# Patient Record
Sex: Female | Born: 1945
Health system: Southern US, Community
[De-identification: ages and names within clinical notes are randomized; demographics above are authoritative.]

## PROBLEM LIST (undated history)

## (undated) DIAGNOSIS — M7020 Olecranon bursitis, unspecified elbow: Secondary | ICD-10-CM

## (undated) DIAGNOSIS — R209 Unspecified disturbances of skin sensation: Secondary | ICD-10-CM

## (undated) DIAGNOSIS — F0390 Unspecified dementia without behavioral disturbance: Secondary | ICD-10-CM

## (undated) DIAGNOSIS — K219 Gastro-esophageal reflux disease without esophagitis: Secondary | ICD-10-CM

## (undated) DIAGNOSIS — M542 Cervicalgia: Secondary | ICD-10-CM

## (undated) DIAGNOSIS — Z5189 Encounter for other specified aftercare: Secondary | ICD-10-CM

## (undated) DIAGNOSIS — N189 Chronic kidney disease, unspecified: Secondary | ICD-10-CM

## (undated) DIAGNOSIS — E785 Hyperlipidemia, unspecified: Secondary | ICD-10-CM

## (undated) DIAGNOSIS — M545 Low back pain, unspecified: Secondary | ICD-10-CM

## (undated) DIAGNOSIS — I509 Heart failure, unspecified: Secondary | ICD-10-CM

## (undated) DIAGNOSIS — M25549 Pain in joints of unspecified hand: Secondary | ICD-10-CM

## (undated) DIAGNOSIS — E1142 Type 2 diabetes mellitus with diabetic polyneuropathy: Secondary | ICD-10-CM

## (undated) DIAGNOSIS — G894 Chronic pain syndrome: Secondary | ICD-10-CM

## (undated) DIAGNOSIS — J449 Chronic obstructive pulmonary disease, unspecified: Secondary | ICD-10-CM

## (undated) DIAGNOSIS — I10 Essential (primary) hypertension: Secondary | ICD-10-CM

## (undated) DIAGNOSIS — IMO0001 Reserved for inherently not codable concepts without codable children: Secondary | ICD-10-CM

## (undated) DIAGNOSIS — G4700 Insomnia, unspecified: Secondary | ICD-10-CM

## (undated) HISTORY — DX: Cervicalgia: M54.2

## (undated) HISTORY — PX: SHOULDER ARTHROSCOPY: SHX128

## (undated) HISTORY — DX: Pain in joints of unspecified hand: M25.549

## (undated) HISTORY — DX: Low back pain: M54.5

## (undated) HISTORY — DX: Type 2 diabetes mellitus with diabetic polyneuropathy: E11.42

## (undated) HISTORY — DX: Unspecified dementia, unspecified severity, without behavioral disturbance, psychotic disturbance, mood disturbance, and anxiety: F03.90

## (undated) HISTORY — PX: BACK SURGERY: SHX140

## (undated) HISTORY — DX: Chronic pain syndrome: G89.4

## (undated) HISTORY — DX: Chronic kidney disease, unspecified: N18.9

## (undated) HISTORY — DX: Gastro-esophageal reflux disease without esophagitis: K21.9

## (undated) HISTORY — DX: Low back pain, unspecified: M54.50

## (undated) HISTORY — PX: CATARACT EXTRACTION: SUR2

## (undated) HISTORY — DX: Olecranon bursitis, unspecified elbow: M70.20

## (undated) HISTORY — DX: Unspecified disturbances of skin sensation: R20.9

## (undated) HISTORY — DX: Insomnia, unspecified: G47.00

## (undated) HISTORY — PX: ABDOMINAL HYSTERECTOMY: SHX81

---

## 1998-03-09 ENCOUNTER — Other Ambulatory Visit: Admission: RE | Admit: 1998-03-09 | Discharge: 1998-03-09 | Payer: Self-pay | Admitting: Family Medicine

## 1998-08-27 ENCOUNTER — Encounter: Payer: Self-pay | Admitting: Emergency Medicine

## 1998-08-27 ENCOUNTER — Emergency Department (HOSPITAL_COMMUNITY): Admission: EM | Admit: 1998-08-27 | Discharge: 1998-08-27 | Payer: Self-pay | Admitting: Emergency Medicine

## 1999-02-02 ENCOUNTER — Encounter: Payer: Self-pay | Admitting: Emergency Medicine

## 1999-02-02 ENCOUNTER — Emergency Department (HOSPITAL_COMMUNITY): Admission: EM | Admit: 1999-02-02 | Discharge: 1999-02-02 | Payer: Self-pay | Admitting: Emergency Medicine

## 1999-03-07 ENCOUNTER — Emergency Department (HOSPITAL_COMMUNITY): Admission: EM | Admit: 1999-03-07 | Discharge: 1999-03-07 | Payer: Self-pay | Admitting: Emergency Medicine

## 2000-04-19 ENCOUNTER — Inpatient Hospital Stay (HOSPITAL_COMMUNITY): Admission: EM | Admit: 2000-04-19 | Discharge: 2000-04-24 | Payer: Self-pay

## 2000-04-19 ENCOUNTER — Encounter: Payer: Self-pay | Admitting: Emergency Medicine

## 2000-04-21 ENCOUNTER — Encounter: Payer: Self-pay | Admitting: Family Medicine

## 2000-10-26 ENCOUNTER — Encounter: Payer: Self-pay | Admitting: Internal Medicine

## 2000-10-26 ENCOUNTER — Inpatient Hospital Stay (HOSPITAL_COMMUNITY): Admission: EM | Admit: 2000-10-26 | Discharge: 2000-10-30 | Payer: Self-pay | Admitting: Internal Medicine

## 2000-12-27 ENCOUNTER — Emergency Department (HOSPITAL_COMMUNITY): Admission: EM | Admit: 2000-12-27 | Discharge: 2000-12-27 | Payer: Self-pay | Admitting: Emergency Medicine

## 2001-06-24 ENCOUNTER — Encounter: Admission: RE | Admit: 2001-06-24 | Discharge: 2001-06-24 | Payer: Self-pay | Admitting: Family Medicine

## 2001-07-29 ENCOUNTER — Encounter: Admission: RE | Admit: 2001-07-29 | Discharge: 2001-07-29 | Payer: Self-pay | Admitting: Family Medicine

## 2001-08-26 ENCOUNTER — Encounter: Admission: RE | Admit: 2001-08-26 | Discharge: 2001-08-26 | Payer: Self-pay | Admitting: Family Medicine

## 2001-09-02 ENCOUNTER — Encounter: Admission: RE | Admit: 2001-09-02 | Discharge: 2001-09-02 | Payer: Self-pay | Admitting: Family Medicine

## 2001-09-16 ENCOUNTER — Encounter: Admission: RE | Admit: 2001-09-16 | Discharge: 2001-09-16 | Payer: Self-pay | Admitting: Family Medicine

## 2001-09-23 ENCOUNTER — Encounter: Admission: RE | Admit: 2001-09-23 | Discharge: 2001-09-23 | Payer: Self-pay | Admitting: Family Medicine

## 2001-09-25 ENCOUNTER — Encounter (INDEPENDENT_AMBULATORY_CARE_PROVIDER_SITE_OTHER): Payer: Self-pay | Admitting: *Deleted

## 2001-09-25 LAB — CONVERTED CEMR LAB

## 2001-09-30 ENCOUNTER — Encounter: Admission: RE | Admit: 2001-09-30 | Discharge: 2001-09-30 | Payer: Self-pay | Admitting: Family Medicine

## 2001-11-01 ENCOUNTER — Emergency Department (HOSPITAL_COMMUNITY): Admission: EM | Admit: 2001-11-01 | Discharge: 2001-11-01 | Payer: Self-pay | Admitting: Emergency Medicine

## 2002-04-05 ENCOUNTER — Encounter: Payer: Self-pay | Admitting: Emergency Medicine

## 2002-04-05 ENCOUNTER — Emergency Department (HOSPITAL_COMMUNITY): Admission: EM | Admit: 2002-04-05 | Discharge: 2002-04-05 | Payer: Self-pay

## 2002-04-27 ENCOUNTER — Encounter: Payer: Self-pay | Admitting: Emergency Medicine

## 2002-04-27 ENCOUNTER — Encounter (INDEPENDENT_AMBULATORY_CARE_PROVIDER_SITE_OTHER): Payer: Self-pay | Admitting: Cardiology

## 2002-04-27 ENCOUNTER — Inpatient Hospital Stay (HOSPITAL_COMMUNITY): Admission: EM | Admit: 2002-04-27 | Discharge: 2002-04-28 | Payer: Self-pay | Admitting: Emergency Medicine

## 2002-04-28 ENCOUNTER — Encounter: Payer: Self-pay | Admitting: Interventional Cardiology

## 2002-05-21 ENCOUNTER — Encounter: Payer: Self-pay | Admitting: *Deleted

## 2002-05-21 ENCOUNTER — Observation Stay (HOSPITAL_COMMUNITY): Admission: AC | Admit: 2002-05-21 | Discharge: 2002-05-22 | Payer: Self-pay | Admitting: *Deleted

## 2003-04-06 ENCOUNTER — Ambulatory Visit (HOSPITAL_COMMUNITY): Admission: RE | Admit: 2003-04-06 | Discharge: 2003-04-06 | Payer: Self-pay | Admitting: Family Medicine

## 2003-09-15 ENCOUNTER — Encounter: Payer: Self-pay | Admitting: Internal Medicine

## 2003-09-15 ENCOUNTER — Ambulatory Visit (HOSPITAL_COMMUNITY): Admission: RE | Admit: 2003-09-15 | Discharge: 2003-09-15 | Payer: Self-pay | Admitting: Internal Medicine

## 2004-01-21 ENCOUNTER — Emergency Department (HOSPITAL_COMMUNITY): Admission: EM | Admit: 2004-01-21 | Discharge: 2004-01-21 | Payer: Self-pay | Admitting: *Deleted

## 2004-08-15 ENCOUNTER — Ambulatory Visit: Payer: Self-pay | Admitting: Nurse Practitioner

## 2004-08-17 ENCOUNTER — Ambulatory Visit: Payer: Self-pay | Admitting: Internal Medicine

## 2004-08-17 ENCOUNTER — Inpatient Hospital Stay (HOSPITAL_COMMUNITY): Admission: EM | Admit: 2004-08-17 | Discharge: 2004-08-24 | Payer: Self-pay | Admitting: Emergency Medicine

## 2004-08-22 ENCOUNTER — Encounter (INDEPENDENT_AMBULATORY_CARE_PROVIDER_SITE_OTHER): Payer: Self-pay | Admitting: *Deleted

## 2004-10-23 ENCOUNTER — Ambulatory Visit: Payer: Self-pay | Admitting: Family Medicine

## 2004-10-24 ENCOUNTER — Ambulatory Visit: Payer: Self-pay | Admitting: Hematology & Oncology

## 2004-11-21 ENCOUNTER — Ambulatory Visit: Payer: Self-pay | Admitting: Nurse Practitioner

## 2004-12-20 ENCOUNTER — Ambulatory Visit: Payer: Self-pay | Admitting: Nurse Practitioner

## 2004-12-20 ENCOUNTER — Encounter: Admission: RE | Admit: 2004-12-20 | Discharge: 2004-12-20 | Payer: Self-pay | Admitting: Internal Medicine

## 2005-01-22 ENCOUNTER — Ambulatory Visit: Payer: Self-pay | Admitting: Nurse Practitioner

## 2005-03-19 ENCOUNTER — Ambulatory Visit: Payer: Self-pay | Admitting: Nurse Practitioner

## 2005-04-19 ENCOUNTER — Ambulatory Visit: Payer: Self-pay | Admitting: Nurse Practitioner

## 2005-05-20 ENCOUNTER — Ambulatory Visit: Payer: Self-pay | Admitting: Nurse Practitioner

## 2005-06-12 ENCOUNTER — Ambulatory Visit: Payer: Self-pay | Admitting: Nurse Practitioner

## 2005-08-08 ENCOUNTER — Ambulatory Visit: Payer: Self-pay | Admitting: Nurse Practitioner

## 2005-09-06 ENCOUNTER — Ambulatory Visit: Payer: Self-pay | Admitting: Nurse Practitioner

## 2005-09-11 ENCOUNTER — Ambulatory Visit (HOSPITAL_COMMUNITY): Admission: RE | Admit: 2005-09-11 | Discharge: 2005-09-11 | Payer: Self-pay | Admitting: Internal Medicine

## 2005-10-10 ENCOUNTER — Ambulatory Visit: Payer: Self-pay | Admitting: Nurse Practitioner

## 2005-11-05 ENCOUNTER — Ambulatory Visit: Payer: Self-pay | Admitting: Nurse Practitioner

## 2005-12-03 ENCOUNTER — Ambulatory Visit: Payer: Self-pay | Admitting: Family Medicine

## 2005-12-20 ENCOUNTER — Ambulatory Visit: Payer: Self-pay | Admitting: Nurse Practitioner

## 2006-01-24 ENCOUNTER — Ambulatory Visit: Payer: Self-pay | Admitting: Nurse Practitioner

## 2006-02-04 ENCOUNTER — Ambulatory Visit: Payer: Self-pay | Admitting: Nurse Practitioner

## 2006-02-21 ENCOUNTER — Ambulatory Visit: Payer: Self-pay | Admitting: Nurse Practitioner

## 2006-03-14 ENCOUNTER — Emergency Department (HOSPITAL_COMMUNITY): Admission: EM | Admit: 2006-03-14 | Discharge: 2006-03-14 | Payer: Self-pay | Admitting: Family Medicine

## 2006-03-24 ENCOUNTER — Ambulatory Visit: Payer: Self-pay | Admitting: Nurse Practitioner

## 2006-04-22 ENCOUNTER — Ambulatory Visit: Payer: Self-pay | Admitting: Nurse Practitioner

## 2006-05-16 ENCOUNTER — Ambulatory Visit: Payer: Self-pay | Admitting: Nurse Practitioner

## 2006-05-31 ENCOUNTER — Emergency Department (HOSPITAL_COMMUNITY): Admission: EM | Admit: 2006-05-31 | Discharge: 2006-05-31 | Payer: Self-pay | Admitting: Emergency Medicine

## 2006-06-13 ENCOUNTER — Ambulatory Visit: Payer: Self-pay | Admitting: Nurse Practitioner

## 2006-08-08 ENCOUNTER — Ambulatory Visit: Payer: Self-pay | Admitting: Nurse Practitioner

## 2006-08-28 ENCOUNTER — Ambulatory Visit: Payer: Self-pay | Admitting: Nurse Practitioner

## 2006-09-18 ENCOUNTER — Ambulatory Visit: Payer: Self-pay | Admitting: Nurse Practitioner

## 2006-11-26 ENCOUNTER — Ambulatory Visit: Payer: Self-pay | Admitting: Nurse Practitioner

## 2006-12-22 ENCOUNTER — Ambulatory Visit: Payer: Self-pay | Admitting: Nurse Practitioner

## 2006-12-30 ENCOUNTER — Emergency Department (HOSPITAL_COMMUNITY): Admission: EM | Admit: 2006-12-30 | Discharge: 2006-12-30 | Payer: Self-pay | Admitting: Family Medicine

## 2007-01-14 ENCOUNTER — Ambulatory Visit: Payer: Self-pay | Admitting: Nurse Practitioner

## 2007-01-19 ENCOUNTER — Ambulatory Visit: Payer: Self-pay | Admitting: Nurse Practitioner

## 2007-01-23 ENCOUNTER — Encounter (INDEPENDENT_AMBULATORY_CARE_PROVIDER_SITE_OTHER): Payer: Self-pay | Admitting: *Deleted

## 2007-02-05 ENCOUNTER — Ambulatory Visit: Payer: Self-pay | Admitting: Nurse Practitioner

## 2007-03-12 ENCOUNTER — Ambulatory Visit: Payer: Self-pay | Admitting: Nurse Practitioner

## 2007-07-03 ENCOUNTER — Ambulatory Visit: Payer: Self-pay | Admitting: Internal Medicine

## 2007-07-28 ENCOUNTER — Ambulatory Visit: Payer: Self-pay | Admitting: Nurse Practitioner

## 2007-07-30 ENCOUNTER — Ambulatory Visit (HOSPITAL_COMMUNITY): Admission: RE | Admit: 2007-07-30 | Discharge: 2007-07-30 | Payer: Self-pay | Admitting: Family Medicine

## 2007-08-27 ENCOUNTER — Ambulatory Visit: Payer: Self-pay | Admitting: Family Medicine

## 2007-09-04 ENCOUNTER — Inpatient Hospital Stay (HOSPITAL_COMMUNITY): Admission: EM | Admit: 2007-09-04 | Discharge: 2007-09-07 | Payer: Self-pay | Admitting: Emergency Medicine

## 2007-09-18 ENCOUNTER — Ambulatory Visit: Payer: Self-pay | Admitting: Family Medicine

## 2007-10-06 ENCOUNTER — Ambulatory Visit (HOSPITAL_COMMUNITY): Admission: RE | Admit: 2007-10-06 | Discharge: 2007-10-06 | Payer: Self-pay | Admitting: Nurse Practitioner

## 2007-12-03 ENCOUNTER — Ambulatory Visit: Payer: Self-pay | Admitting: Family Medicine

## 2008-01-01 ENCOUNTER — Ambulatory Visit: Payer: Self-pay | Admitting: Family Medicine

## 2008-03-25 ENCOUNTER — Ambulatory Visit: Payer: Self-pay | Admitting: Internal Medicine

## 2008-05-03 ENCOUNTER — Ambulatory Visit: Payer: Self-pay | Admitting: Internal Medicine

## 2008-05-08 ENCOUNTER — Inpatient Hospital Stay (HOSPITAL_COMMUNITY): Admission: EM | Admit: 2008-05-08 | Discharge: 2008-05-14 | Payer: Self-pay | Admitting: Emergency Medicine

## 2008-08-30 ENCOUNTER — Emergency Department (HOSPITAL_COMMUNITY): Admission: EM | Admit: 2008-08-30 | Discharge: 2008-08-30 | Payer: Self-pay | Admitting: Emergency Medicine

## 2008-10-07 ENCOUNTER — Ambulatory Visit: Payer: Self-pay | Admitting: Internal Medicine

## 2008-10-07 ENCOUNTER — Encounter (INDEPENDENT_AMBULATORY_CARE_PROVIDER_SITE_OTHER): Payer: Self-pay | Admitting: Internal Medicine

## 2008-10-07 LAB — CONVERTED CEMR LAB
ALT: <8 units/L (ref 0–35)
AST: 10 units/L (ref 0–37)
Albumin: 4.1 g/dL (ref 3.5–5.2)
Alkaline Phosphatase: 74 units/L (ref 39–117)
BUN: 11 mg/dL (ref 6–23)
Basophils Absolute: 0.1 10*3/uL (ref 0.0–0.1)
Basophils Relative: 1 % (ref 0–1)
CO2: 25 meq/L (ref 19–32)
Calcium: 10.1 mg/dL (ref 8.4–10.5)
Chloride: 102 meq/L (ref 96–112)
Creatinine, Ser: 0.9 mg/dL (ref 0.40–1.20)
Eosinophils Absolute: 0.2 10*3/uL (ref 0.0–0.7)
Eosinophils Relative: 2 % (ref 0–5)
Glucose, Bld: 102 mg/dL — ABNORMAL HIGH (ref 70–99)
HCT: 40.7 % (ref 36.0–46.0)
Hemoglobin: 13.1 g/dL (ref 12.0–15.0)
Lymphocytes Relative: 38 % (ref 12–46)
Lymphs Abs: 4.4 10*3/uL — ABNORMAL HIGH (ref 0.7–4.0)
MCHC: 32.2 g/dL (ref 30.0–36.0)
MCV: 74.4 fL — ABNORMAL LOW (ref 78.0–100.0)
Monocytes Absolute: 0.7 10*3/uL (ref 0.1–1.0)
Monocytes Relative: 6 % (ref 3–12)
Neutro Abs: 6.2 10*3/uL (ref 1.7–7.7)
Neutrophils Relative %: 53 % (ref 43–77)
Platelets: 341 10*3/uL (ref 150–400)
Potassium: 4.4 meq/L (ref 3.5–5.3)
RBC: 5.47 M/uL — ABNORMAL HIGH (ref 3.87–5.11)
RDW: 14.1 % (ref 11.5–15.5)
Sodium: 137 meq/L (ref 135–145)
Total Bilirubin: 0.3 mg/dL (ref 0.3–1.2)
Total Protein: 6.4 g/dL (ref 6.0–8.3)
WBC: 11.6 10*3/uL — ABNORMAL HIGH (ref 4.0–10.5)

## 2008-10-10 ENCOUNTER — Ambulatory Visit: Payer: Self-pay | Admitting: Internal Medicine

## 2008-10-11 ENCOUNTER — Ambulatory Visit (HOSPITAL_COMMUNITY): Admission: RE | Admit: 2008-10-11 | Discharge: 2008-10-11 | Payer: Self-pay | Admitting: Internal Medicine

## 2008-11-10 ENCOUNTER — Ambulatory Visit: Payer: Self-pay | Admitting: Internal Medicine

## 2008-11-18 ENCOUNTER — Emergency Department (HOSPITAL_COMMUNITY): Admission: EM | Admit: 2008-11-18 | Discharge: 2008-11-18 | Payer: Self-pay | Admitting: Emergency Medicine

## 2008-12-07 ENCOUNTER — Ambulatory Visit: Payer: Self-pay | Admitting: Internal Medicine

## 2008-12-07 ENCOUNTER — Encounter (INDEPENDENT_AMBULATORY_CARE_PROVIDER_SITE_OTHER): Payer: Self-pay | Admitting: Internal Medicine

## 2008-12-07 LAB — CONVERTED CEMR LAB
Basophils Absolute: 0.1 10*3/uL (ref 0.0–0.1)
Basophils Relative: 0 % (ref 0–1)
Eosinophils Absolute: 0.3 10*3/uL (ref 0.0–0.7)
Eosinophils Relative: 2 % (ref 0–5)
HCT: 42.4 % (ref 36.0–46.0)
Hemoglobin: 13.5 g/dL (ref 12.0–15.0)
Lymphocytes Relative: 35 % (ref 12–46)
Lymphs Abs: 4.6 10*3/uL — ABNORMAL HIGH (ref 0.7–4.0)
MCHC: 31.8 g/dL (ref 30.0–36.0)
MCV: 74.5 fL — ABNORMAL LOW (ref 78.0–100.0)
Monocytes Absolute: 0.9 10*3/uL (ref 0.1–1.0)
Monocytes Relative: 7 % (ref 3–12)
Neutro Abs: 7.3 10*3/uL (ref 1.7–7.7)
Neutrophils Relative %: 56 % (ref 43–77)
Platelets: 317 10*3/uL (ref 150–400)
RBC: 5.69 M/uL — ABNORMAL HIGH (ref 3.87–5.11)
RDW: 14.2 % (ref 11.5–15.5)
WBC: 13.1 10*3/uL — ABNORMAL HIGH (ref 4.0–10.5)

## 2008-12-27 ENCOUNTER — Ambulatory Visit: Payer: Self-pay | Admitting: Hematology & Oncology

## 2009-01-09 ENCOUNTER — Encounter (INDEPENDENT_AMBULATORY_CARE_PROVIDER_SITE_OTHER): Payer: Self-pay | Admitting: Internal Medicine

## 2009-01-09 ENCOUNTER — Ambulatory Visit: Payer: Self-pay | Admitting: Internal Medicine

## 2009-01-09 LAB — CONVERTED CEMR LAB: Microalb, Ur: 0.5 mg/dL (ref 0.00–1.89)

## 2009-01-16 LAB — CBC WITH DIFFERENTIAL (CANCER CENTER ONLY)
BASO#: 0.1 10*3/uL (ref 0.0–0.2)
BASO%: 0.7 % (ref 0.0–2.0)
EOS%: 1.9 % (ref 0.0–7.0)
Eosinophils Absolute: 0.2 10*3/uL (ref 0.0–0.5)
HCT: 42.8 % (ref 34.8–46.6)
HGB: 13.2 g/dL (ref 11.6–15.9)
LYMPH#: 3.1 10*3/uL (ref 0.9–3.3)
LYMPH%: 28.3 % (ref 14.0–48.0)
MCH: 23.8 pg — ABNORMAL LOW (ref 26.0–34.0)
MCHC: 30.8 g/dL — ABNORMAL LOW (ref 32.0–36.0)
MCV: 77 fL — ABNORMAL LOW (ref 81–101)
MONO#: 0.4 10*3/uL (ref 0.1–0.9)
MONO%: 3.9 % (ref 0.0–13.0)
NEUT#: 7.2 10*3/uL — ABNORMAL HIGH (ref 1.5–6.5)
NEUT%: 65.2 % (ref 39.6–80.0)
Platelets: 289 10*3/uL (ref 145–400)
RBC: 5.53 10*6/uL — ABNORMAL HIGH (ref 3.70–5.32)
RDW: 11.7 % (ref 10.5–14.6)
WBC: 11.1 10*3/uL — ABNORMAL HIGH (ref 3.9–10.0)

## 2009-01-16 LAB — CHCC SATELLITE - SMEAR

## 2009-01-20 LAB — HEMOGLOBINOPATHY EVALUATION
Hemoglobin Other: 0 % (ref 0.0–0.0)
Hgb A2 Quant: 2.4 % (ref 2.2–3.2)
Hgb A: 97.6 % (ref 96.8–97.8)
Hgb F Quant: 0 % (ref 0.0–2.0)
Hgb S Quant: 0 % (ref 0.0–0.0)

## 2009-01-20 LAB — ERYTHROPOIETIN: Erythropoietin: 7.4 m[IU]/mL (ref 2.6–34.0)

## 2009-01-20 LAB — JAK2 GENOTYPR

## 2009-01-20 LAB — JAK2 EXONS 12 & 13 MUTATION, REFLEX: JAK2 Exons 12 & 13: 13

## 2009-01-20 LAB — VITAMIN B12: Vitamin B-12: 434 pg/mL (ref 211–911)

## 2009-01-20 LAB — FERRITIN: Ferritin: 288 ng/mL (ref 10–291)

## 2009-01-31 ENCOUNTER — Emergency Department (HOSPITAL_COMMUNITY): Admission: EM | Admit: 2009-01-31 | Discharge: 2009-01-31 | Payer: Self-pay | Admitting: Family Medicine

## 2009-02-23 ENCOUNTER — Ambulatory Visit (HOSPITAL_COMMUNITY): Admission: RE | Admit: 2009-02-23 | Discharge: 2009-02-23 | Payer: Self-pay | Admitting: *Deleted

## 2009-02-23 ENCOUNTER — Encounter (INDEPENDENT_AMBULATORY_CARE_PROVIDER_SITE_OTHER): Payer: Self-pay | Admitting: Internal Medicine

## 2009-02-23 ENCOUNTER — Ambulatory Visit: Payer: Self-pay | Admitting: Internal Medicine

## 2009-02-23 LAB — CONVERTED CEMR LAB
Basophils Absolute: 0.1 10*3/uL (ref 0.0–0.1)
Basophils Relative: 0 % (ref 0–1)
Eosinophils Absolute: 0.2 10*3/uL (ref 0.0–0.7)
Eosinophils Relative: 2 % (ref 0–5)
HCT: 41.3 % (ref 36.0–46.0)
Hemoglobin: 13.5 g/dL (ref 12.0–15.0)
Lymphocytes Relative: 25 % (ref 12–46)
Lymphs Abs: 3.1 10*3/uL (ref 0.7–4.0)
MCHC: 32.7 g/dL (ref 30.0–36.0)
MCV: 76.5 fL — ABNORMAL LOW (ref 78.0–100.0)
Monocytes Absolute: 0.8 10*3/uL (ref 0.1–1.0)
Monocytes Relative: 7 % (ref 3–12)
Neutro Abs: 8.3 10*3/uL — ABNORMAL HIGH (ref 1.7–7.7)
Neutrophils Relative %: 67 % (ref 43–77)
Platelets: 304 10*3/uL (ref 150–400)
RBC: 5.4 M/uL — ABNORMAL HIGH (ref 3.87–5.11)
RDW: 14.9 % (ref 11.5–15.5)
WBC: 12.4 10*3/uL — ABNORMAL HIGH (ref 4.0–10.5)

## 2009-02-27 ENCOUNTER — Ambulatory Visit: Payer: Self-pay | Admitting: Internal Medicine

## 2009-03-06 ENCOUNTER — Ambulatory Visit: Payer: Self-pay | Admitting: Internal Medicine

## 2009-03-13 ENCOUNTER — Ambulatory Visit: Payer: Self-pay | Admitting: Internal Medicine

## 2009-04-13 ENCOUNTER — Encounter
Admission: RE | Admit: 2009-04-13 | Discharge: 2009-07-12 | Payer: Self-pay | Admitting: Physical Medicine and Rehabilitation

## 2009-04-17 ENCOUNTER — Ambulatory Visit: Payer: Self-pay | Admitting: Physical Medicine and Rehabilitation

## 2009-04-21 ENCOUNTER — Ambulatory Visit (HOSPITAL_COMMUNITY)
Admission: RE | Admit: 2009-04-21 | Discharge: 2009-04-21 | Payer: Self-pay | Admitting: Physical Medicine and Rehabilitation

## 2009-05-22 ENCOUNTER — Ambulatory Visit: Payer: Self-pay | Admitting: Physical Medicine and Rehabilitation

## 2009-06-21 ENCOUNTER — Ambulatory Visit: Payer: Self-pay | Admitting: Physical Medicine and Rehabilitation

## 2009-07-06 ENCOUNTER — Ambulatory Visit (HOSPITAL_COMMUNITY)
Admission: RE | Admit: 2009-07-06 | Discharge: 2009-07-06 | Payer: Self-pay | Admitting: Physical Medicine and Rehabilitation

## 2009-07-14 ENCOUNTER — Encounter (INDEPENDENT_AMBULATORY_CARE_PROVIDER_SITE_OTHER): Payer: Self-pay | Admitting: Internal Medicine

## 2009-07-14 ENCOUNTER — Ambulatory Visit: Payer: Self-pay | Admitting: Internal Medicine

## 2009-07-14 LAB — CONVERTED CEMR LAB
ALT: 11 units/L (ref 0–35)
AST: 17 units/L (ref 0–37)
Albumin: 3.9 g/dL (ref 3.5–5.2)
Alkaline Phosphatase: 93 units/L (ref 39–117)
BUN: 7 mg/dL (ref 6–23)
Basophils Absolute: 0 10*3/uL (ref 0.0–0.1)
Basophils Relative: 0 % (ref 0–1)
CO2: 22 meq/L (ref 19–32)
Calcium: 9.6 mg/dL (ref 8.4–10.5)
Chloride: 101 meq/L (ref 96–112)
Creatinine, Ser: 0.95 mg/dL (ref 0.40–1.20)
Eosinophils Absolute: 0.2 10*3/uL (ref 0.0–0.7)
Eosinophils Relative: 2 % (ref 0–5)
Glucose, Bld: 117 mg/dL — ABNORMAL HIGH (ref 70–99)
HCT: 40.2 % (ref 36.0–46.0)
Hemoglobin: 12.5 g/dL (ref 12.0–15.0)
Lymphocytes Relative: 27 % (ref 12–46)
Lymphs Abs: 2.9 10*3/uL (ref 0.7–4.0)
MCHC: 31.1 g/dL (ref 30.0–36.0)
MCV: 76.3 fL — ABNORMAL LOW (ref 78.0–100.0)
Monocytes Absolute: 0.9 10*3/uL (ref 0.1–1.0)
Monocytes Relative: 8 % (ref 3–12)
Neutro Abs: 6.5 10*3/uL (ref 1.7–7.7)
Neutrophils Relative %: 62 % (ref 43–77)
Platelets: 304 10*3/uL (ref 150–400)
Potassium: 4.9 meq/L (ref 3.5–5.3)
RBC: 5.27 M/uL — ABNORMAL HIGH (ref 3.87–5.11)
RDW: 15.2 % (ref 11.5–15.5)
Sodium: 136 meq/L (ref 135–145)
Total Bilirubin: 0.3 mg/dL (ref 0.3–1.2)
Total Protein: 6.1 g/dL (ref 6.0–8.3)
WBC: 10.6 10*3/uL — ABNORMAL HIGH (ref 4.0–10.5)

## 2009-07-17 ENCOUNTER — Encounter
Admission: RE | Admit: 2009-07-17 | Discharge: 2009-10-13 | Payer: Self-pay | Admitting: Physical Medicine and Rehabilitation

## 2009-07-19 ENCOUNTER — Ambulatory Visit: Payer: Self-pay | Admitting: Physical Medicine and Rehabilitation

## 2009-07-21 ENCOUNTER — Telehealth (INDEPENDENT_AMBULATORY_CARE_PROVIDER_SITE_OTHER): Payer: Self-pay | Admitting: *Deleted

## 2009-07-26 ENCOUNTER — Ambulatory Visit (HOSPITAL_COMMUNITY): Admission: RE | Admit: 2009-07-26 | Discharge: 2009-07-26 | Payer: Self-pay | Admitting: Internal Medicine

## 2009-08-18 ENCOUNTER — Ambulatory Visit: Payer: Self-pay | Admitting: Physical Medicine and Rehabilitation

## 2009-08-31 ENCOUNTER — Telehealth (INDEPENDENT_AMBULATORY_CARE_PROVIDER_SITE_OTHER): Payer: Self-pay | Admitting: *Deleted

## 2009-09-15 ENCOUNTER — Ambulatory Visit: Payer: Self-pay | Admitting: Physical Medicine and Rehabilitation

## 2009-09-17 ENCOUNTER — Emergency Department (HOSPITAL_COMMUNITY): Admission: EM | Admit: 2009-09-17 | Discharge: 2009-09-17 | Payer: Self-pay | Admitting: Emergency Medicine

## 2009-10-13 ENCOUNTER — Encounter
Admission: RE | Admit: 2009-10-13 | Discharge: 2009-11-15 | Payer: Self-pay | Admitting: Physical Medicine and Rehabilitation

## 2009-10-16 ENCOUNTER — Ambulatory Visit: Payer: Self-pay | Admitting: Physical Medicine and Rehabilitation

## 2009-10-27 ENCOUNTER — Ambulatory Visit: Payer: Self-pay | Admitting: Internal Medicine

## 2009-10-27 ENCOUNTER — Encounter (INDEPENDENT_AMBULATORY_CARE_PROVIDER_SITE_OTHER): Payer: Self-pay | Admitting: Internal Medicine

## 2009-10-27 LAB — CONVERTED CEMR LAB
ALT: <8 units/L (ref 0–35)
AST: 10 units/L (ref 0–37)
Albumin: 4.3 g/dL (ref 3.5–5.2)
Alkaline Phosphatase: 96 units/L (ref 39–117)
BUN: 11 mg/dL (ref 6–23)
CO2: 25 meq/L (ref 19–32)
Calcium: 9.6 mg/dL (ref 8.4–10.5)
Chloride: 103 meq/L (ref 96–112)
Creatinine, Ser: 0.87 mg/dL (ref 0.40–1.20)
Glucose, Bld: 159 mg/dL — ABNORMAL HIGH (ref 70–99)
Microalb, Ur: 1.25 mg/dL (ref 0.00–1.89)
Potassium: 4.4 meq/L (ref 3.5–5.3)
Sodium: 140 meq/L (ref 135–145)
Total Bilirubin: 0.3 mg/dL (ref 0.3–1.2)
Total Protein: 6.7 g/dL (ref 6.0–8.3)

## 2009-11-29 ENCOUNTER — Encounter
Admission: RE | Admit: 2009-11-29 | Discharge: 2010-02-27 | Payer: Self-pay | Admitting: Physical Medicine and Rehabilitation

## 2009-11-30 ENCOUNTER — Ambulatory Visit: Payer: Self-pay | Admitting: Physical Medicine and Rehabilitation

## 2009-12-02 ENCOUNTER — Emergency Department (HOSPITAL_COMMUNITY): Admission: EM | Admit: 2009-12-02 | Discharge: 2009-12-03 | Payer: Self-pay | Admitting: Emergency Medicine

## 2009-12-03 ENCOUNTER — Ambulatory Visit (HOSPITAL_COMMUNITY): Admission: RE | Admit: 2009-12-03 | Discharge: 2009-12-03 | Payer: Self-pay | Admitting: Emergency Medicine

## 2009-12-15 ENCOUNTER — Ambulatory Visit: Payer: Self-pay | Admitting: Internal Medicine

## 2009-12-29 ENCOUNTER — Ambulatory Visit: Payer: Self-pay | Admitting: Physical Medicine and Rehabilitation

## 2010-01-05 ENCOUNTER — Ambulatory Visit: Payer: Self-pay | Admitting: Internal Medicine

## 2010-01-24 ENCOUNTER — Ambulatory Visit: Payer: Self-pay | Admitting: Physical Medicine and Rehabilitation

## 2010-02-22 ENCOUNTER — Ambulatory Visit: Payer: Self-pay | Admitting: Physical Medicine and Rehabilitation

## 2010-03-14 ENCOUNTER — Encounter
Admission: RE | Admit: 2010-03-14 | Discharge: 2010-06-01 | Payer: Self-pay | Admitting: Physical Medicine and Rehabilitation

## 2010-03-21 ENCOUNTER — Ambulatory Visit: Payer: Self-pay | Admitting: Physical Medicine and Rehabilitation

## 2010-03-23 ENCOUNTER — Ambulatory Visit: Payer: Self-pay | Admitting: Internal Medicine

## 2010-04-02 ENCOUNTER — Ambulatory Visit: Payer: Self-pay | Admitting: Internal Medicine

## 2010-04-04 ENCOUNTER — Ambulatory Visit (HOSPITAL_COMMUNITY)
Admission: RE | Admit: 2010-04-04 | Discharge: 2010-04-04 | Payer: Self-pay | Admitting: Physical Medicine and Rehabilitation

## 2010-04-18 ENCOUNTER — Ambulatory Visit
Admission: RE | Admit: 2010-04-18 | Discharge: 2010-04-18 | Payer: Self-pay | Admitting: Physical Medicine and Rehabilitation

## 2010-04-18 ENCOUNTER — Encounter: Payer: Self-pay | Admitting: Physical Medicine and Rehabilitation

## 2010-04-18 ENCOUNTER — Ambulatory Visit: Payer: Self-pay | Admitting: Physical Medicine and Rehabilitation

## 2010-04-18 ENCOUNTER — Ambulatory Visit: Payer: Self-pay | Admitting: Vascular Surgery

## 2010-05-18 ENCOUNTER — Ambulatory Visit: Payer: Self-pay | Admitting: Internal Medicine

## 2010-05-24 ENCOUNTER — Emergency Department (HOSPITAL_COMMUNITY): Admission: EM | Admit: 2010-05-24 | Discharge: 2010-05-24 | Payer: Self-pay | Admitting: Emergency Medicine

## 2010-06-01 ENCOUNTER — Encounter
Admission: RE | Admit: 2010-06-01 | Discharge: 2010-08-30 | Payer: Self-pay | Admitting: Physical Medicine and Rehabilitation

## 2010-06-01 ENCOUNTER — Ambulatory Visit: Payer: Self-pay | Admitting: Internal Medicine

## 2010-06-12 ENCOUNTER — Emergency Department (HOSPITAL_COMMUNITY): Admission: EM | Admit: 2010-06-12 | Discharge: 2010-06-13 | Payer: Self-pay | Admitting: Emergency Medicine

## 2010-06-22 ENCOUNTER — Ambulatory Visit: Payer: Self-pay | Admitting: Internal Medicine

## 2010-06-22 ENCOUNTER — Ambulatory Visit: Payer: Self-pay | Admitting: Physical Medicine and Rehabilitation

## 2010-06-22 LAB — CONVERTED CEMR LAB
BUN: 11 mg/dL (ref 6–23)
CO2: 24 meq/L (ref 19–32)
Calcium: 9.1 mg/dL (ref 8.4–10.5)
Chloride: 99 meq/L (ref 96–112)
Creatinine, Ser: 1.03 mg/dL (ref 0.40–1.20)
Glucose, Bld: 181 mg/dL — ABNORMAL HIGH (ref 70–99)
Magnesium: 1.7 mg/dL (ref 1.5–2.5)
Potassium: 5.2 meq/L (ref 3.5–5.3)
Pro B Natriuretic peptide (BNP): 182.1 pg/mL — ABNORMAL HIGH (ref 0.0–100.0)
Sodium: 137 meq/L (ref 135–145)

## 2010-07-06 ENCOUNTER — Ambulatory Visit: Payer: Self-pay | Admitting: Physical Medicine and Rehabilitation

## 2010-07-27 ENCOUNTER — Ambulatory Visit: Payer: Self-pay | Admitting: Physical Medicine and Rehabilitation

## 2010-08-06 ENCOUNTER — Ambulatory Visit: Payer: Self-pay | Admitting: Physical Medicine and Rehabilitation

## 2010-08-22 ENCOUNTER — Ambulatory Visit: Payer: Self-pay | Admitting: Physical Medicine and Rehabilitation

## 2010-08-30 ENCOUNTER — Encounter
Admission: RE | Admit: 2010-08-30 | Discharge: 2010-11-22 | Payer: Self-pay | Source: Home / Self Care | Attending: Physical Medicine and Rehabilitation | Admitting: Physical Medicine and Rehabilitation

## 2010-09-05 ENCOUNTER — Ambulatory Visit: Payer: Self-pay | Admitting: Physical Medicine and Rehabilitation

## 2010-09-19 ENCOUNTER — Ambulatory Visit: Payer: Self-pay | Admitting: Physical Medicine and Rehabilitation

## 2010-10-03 ENCOUNTER — Ambulatory Visit: Payer: Self-pay | Admitting: Physical Medicine and Rehabilitation

## 2010-10-16 ENCOUNTER — Ambulatory Visit: Payer: Self-pay | Admitting: Physical Medicine and Rehabilitation

## 2010-10-31 ENCOUNTER — Ambulatory Visit: Payer: Self-pay | Admitting: Physical Medicine and Rehabilitation

## 2010-11-13 ENCOUNTER — Ambulatory Visit: Payer: Self-pay | Admitting: Physical Medicine and Rehabilitation

## 2010-11-27 ENCOUNTER — Encounter
Admission: RE | Admit: 2010-11-27 | Discharge: 2010-12-25 | Payer: Self-pay | Source: Home / Self Care | Attending: Physical Medicine and Rehabilitation | Admitting: Physical Medicine and Rehabilitation

## 2010-12-21 ENCOUNTER — Ambulatory Visit: Admit: 2010-12-21 | Payer: Self-pay | Admitting: Physical Medicine and Rehabilitation

## 2010-12-27 NOTE — Progress Notes (Signed)
Summary: Re: diabectic shoes, inserts and back support from [facility]  Phone Note Other Incoming   Summary of Call: Late entry: see pink sheets for specific dates of interactions: Mr. Doristine Mango with [facility] [employer] has made multiple visits to [facility] related to paperwork for Ms. Ammon's order for diabetic shoes and inserts, and back support. Apparently, they provided the equipment 04/30/09 and her account has been audited by Medicare for proper documentation. He first presents a signed medical release from Ms. Whittinghill stating we can send all medical records to him. Due to ongoing concerns related to the professionalism of this company, the patient was contacted to verify her intent to share her medical records with [employer]. Patient states to [place] Id-Din, ANP and witness by Clinical research associate and Darnelle Maffucci, BOM, that she does not want them to have her records and that was not her understanding of her signature. She states that her records are between her and her provider. Withdrawal of consent signed and Mr. Doristine Mango notified that we would not be able to send any records at this time. He was advised that if the need was for an audit, he could bring the paperwork by and we would provide records for the time period in question, since it is related payment for services. Upon presentation of the audit, it is noted the dates of service in question are 04/30/09. Patient did not have any notes from this time and further, our provider did not agree to order shoes until 07/14/09 during an office visit. 07-20-09 Call to Mr. Doristine Mango and advised we would not be able to assist with any records, encouraged him to check with Ms. Gavina to determine if she was being seen by another provider that approved the order at that time. Advised he is welcome to come and pick up the order if needed, other wise no records will be provided. Have also requested he stop dropping in, he should call if he is in need of assistance.   Initial call taken by: Blondell Reveal RN,  July 21, 2009 10:41 AM

## 2011-01-02 ENCOUNTER — Encounter: Payer: Medicare Other | Attending: Physical Medicine and Rehabilitation

## 2011-01-02 ENCOUNTER — Ambulatory Visit: Payer: Medicare Other | Admitting: Physical Medicine and Rehabilitation

## 2011-01-02 DIAGNOSIS — M79609 Pain in unspecified limb: Secondary | ICD-10-CM

## 2011-01-02 DIAGNOSIS — R29898 Other symptoms and signs involving the musculoskeletal system: Secondary | ICD-10-CM | POA: Insufficient documentation

## 2011-01-02 DIAGNOSIS — M545 Low back pain, unspecified: Secondary | ICD-10-CM

## 2011-01-02 DIAGNOSIS — M25529 Pain in unspecified elbow: Secondary | ICD-10-CM

## 2011-01-02 DIAGNOSIS — S143XXA Injury of brachial plexus, initial encounter: Secondary | ICD-10-CM | POA: Insufficient documentation

## 2011-01-02 DIAGNOSIS — R42 Dizziness and giddiness: Secondary | ICD-10-CM | POA: Insufficient documentation

## 2011-01-02 DIAGNOSIS — M62838 Other muscle spasm: Secondary | ICD-10-CM | POA: Insufficient documentation

## 2011-01-02 DIAGNOSIS — W19XXXS Unspecified fall, sequela: Secondary | ICD-10-CM | POA: Insufficient documentation

## 2011-01-02 DIAGNOSIS — R209 Unspecified disturbances of skin sensation: Secondary | ICD-10-CM | POA: Insufficient documentation

## 2011-01-02 DIAGNOSIS — R279 Unspecified lack of coordination: Secondary | ICD-10-CM

## 2011-01-02 DIAGNOSIS — R262 Difficulty in walking, not elsewhere classified: Secondary | ICD-10-CM | POA: Insufficient documentation

## 2011-01-29 ENCOUNTER — Encounter: Payer: Medicare Other | Attending: Physical Medicine and Rehabilitation

## 2011-01-29 ENCOUNTER — Ambulatory Visit: Payer: Medicare Other

## 2011-01-29 DIAGNOSIS — M25549 Pain in joints of unspecified hand: Secondary | ICD-10-CM

## 2011-01-29 DIAGNOSIS — M545 Low back pain, unspecified: Secondary | ICD-10-CM | POA: Insufficient documentation

## 2011-01-29 DIAGNOSIS — Z79899 Other long term (current) drug therapy: Secondary | ICD-10-CM | POA: Insufficient documentation

## 2011-01-29 DIAGNOSIS — S4490XS Injury of unspecified nerve at shoulder and upper arm level, unspecified arm, sequela: Secondary | ICD-10-CM | POA: Insufficient documentation

## 2011-01-29 DIAGNOSIS — M79609 Pain in unspecified limb: Secondary | ICD-10-CM | POA: Insufficient documentation

## 2011-01-29 DIAGNOSIS — R209 Unspecified disturbances of skin sensation: Secondary | ICD-10-CM

## 2011-01-29 DIAGNOSIS — W19XXXS Unspecified fall, sequela: Secondary | ICD-10-CM | POA: Insufficient documentation

## 2011-01-29 DIAGNOSIS — IMO0002 Reserved for concepts with insufficient information to code with codable children: Secondary | ICD-10-CM | POA: Insufficient documentation

## 2011-01-29 DIAGNOSIS — M542 Cervicalgia: Secondary | ICD-10-CM

## 2011-01-29 DIAGNOSIS — F17200 Nicotine dependence, unspecified, uncomplicated: Secondary | ICD-10-CM | POA: Insufficient documentation

## 2011-02-09 LAB — BASIC METABOLIC PANEL
BUN: 9 mg/dL (ref 6–23)
CO2: 24 mEq/L (ref 19–32)
Calcium: 9.2 mg/dL (ref 8.4–10.5)
Chloride: 101 mEq/L (ref 96–112)
Creatinine, Ser: 0.86 mg/dL (ref 0.4–1.2)
GFR calc Af Amer: >60 mL/min (ref >60)
GFR calc non Af Amer: >60 mL/min (ref >60)
Glucose, Bld: 170 mg/dL — ABNORMAL HIGH (ref 70–99)
Potassium: 3.8 mEq/L (ref 3.5–5.1)
Sodium: 134 mEq/L — ABNORMAL LOW (ref 135–145)

## 2011-02-10 LAB — DIFFERENTIAL
Basophils Absolute: 0 10*3/uL (ref 0.0–0.1)
Basophils Relative: 0 % (ref 0–1)
Eosinophils Absolute: 0.1 10*3/uL (ref 0.0–0.7)
Eosinophils Relative: 1 % (ref 0–5)
Lymphocytes Relative: 23 % (ref 12–46)
Lymphs Abs: 3.1 10*3/uL (ref 0.7–4.0)
Monocytes Absolute: 0.6 10*3/uL (ref 0.1–1.0)
Monocytes Relative: 4 % (ref 3–12)
Neutro Abs: 9.4 10*3/uL — ABNORMAL HIGH (ref 1.7–7.7)
Neutrophils Relative %: 71 % (ref 43–77)

## 2011-02-10 LAB — BASIC METABOLIC PANEL
BUN: 7 mg/dL (ref 6–23)
CO2: 28 mEq/L (ref 19–32)
Calcium: 9.4 mg/dL (ref 8.4–10.5)
Chloride: 105 mEq/L (ref 96–112)
Creatinine, Ser: 1.02 mg/dL (ref 0.4–1.2)
GFR calc Af Amer: >60 mL/min (ref >60)
GFR calc non Af Amer: 54 mL/min — ABNORMAL LOW (ref >60)
Glucose, Bld: 72 mg/dL (ref 70–99)
Potassium: 5 mEq/L (ref 3.5–5.1)
Sodium: 138 mEq/L (ref 135–145)

## 2011-02-10 LAB — CBC
HCT: 38.3 % (ref 36.0–46.0)
Hemoglobin: 12.5 g/dL (ref 12.0–15.0)
MCH: 25.6 pg — ABNORMAL LOW (ref 26.0–34.0)
MCHC: 32.5 g/dL (ref 30.0–36.0)
MCV: 78.6 fL (ref 78.0–100.0)
Platelets: 238 10*3/uL (ref 150–400)
RBC: 4.88 MIL/uL (ref 3.87–5.11)
RDW: 14.7 % (ref 11.5–15.5)
WBC: 13.2 10*3/uL — ABNORMAL HIGH (ref 4.0–10.5)

## 2011-02-10 LAB — URINALYSIS, ROUTINE W REFLEX MICROSCOPIC
Bilirubin Urine: NEGATIVE
Glucose, UA: NEGATIVE mg/dL
Hgb urine dipstick: NEGATIVE
Ketones, ur: NEGATIVE mg/dL
Nitrite: NEGATIVE
Protein, ur: NEGATIVE mg/dL
Specific Gravity, Urine: 1.009 (ref 1.005–1.030)
Urobilinogen, UA: 0.2 mg/dL (ref 0.0–1.0)
pH: 6.5 (ref 5.0–8.0)

## 2011-03-06 LAB — CREATININE, SERUM
Creatinine, Ser: 0.97 mg/dL (ref 0.4–1.2)
GFR calc Af Amer: >60 mL/min (ref >60)
GFR calc non Af Amer: 58 mL/min — ABNORMAL LOW (ref >60)

## 2011-03-06 LAB — BUN: BUN: 4 mg/dL — ABNORMAL LOW (ref 6–23)

## 2011-03-11 ENCOUNTER — Encounter: Payer: Medicare Other | Admitting: Physical Medicine and Rehabilitation

## 2011-04-01 ENCOUNTER — Encounter: Payer: Medicare Other | Attending: Neurosurgery | Admitting: Neurosurgery

## 2011-04-01 DIAGNOSIS — M79609 Pain in unspecified limb: Secondary | ICD-10-CM

## 2011-04-01 DIAGNOSIS — F17200 Nicotine dependence, unspecified, uncomplicated: Secondary | ICD-10-CM | POA: Insufficient documentation

## 2011-04-01 DIAGNOSIS — G894 Chronic pain syndrome: Secondary | ICD-10-CM | POA: Insufficient documentation

## 2011-04-01 DIAGNOSIS — M67919 Unspecified disorder of synovium and tendon, unspecified shoulder: Secondary | ICD-10-CM

## 2011-04-01 DIAGNOSIS — E669 Obesity, unspecified: Secondary | ICD-10-CM | POA: Insufficient documentation

## 2011-04-01 DIAGNOSIS — F3289 Other specified depressive episodes: Secondary | ICD-10-CM | POA: Insufficient documentation

## 2011-04-01 DIAGNOSIS — R209 Unspecified disturbances of skin sensation: Secondary | ICD-10-CM | POA: Insufficient documentation

## 2011-04-01 DIAGNOSIS — F329 Major depressive disorder, single episode, unspecified: Secondary | ICD-10-CM | POA: Insufficient documentation

## 2011-04-01 DIAGNOSIS — M719 Bursopathy, unspecified: Secondary | ICD-10-CM

## 2011-04-01 DIAGNOSIS — M25569 Pain in unspecified knee: Secondary | ICD-10-CM | POA: Insufficient documentation

## 2011-04-01 DIAGNOSIS — M7500 Adhesive capsulitis of unspecified shoulder: Secondary | ICD-10-CM

## 2011-04-02 ENCOUNTER — Inpatient Hospital Stay (HOSPITAL_COMMUNITY)
Admission: EM | Admit: 2011-04-02 | Discharge: 2011-04-07 | DRG: 190 | Disposition: A | Discharge status: Home Health | Payer: Medicare Other | Attending: Emergency Medicine | Admitting: Emergency Medicine

## 2011-04-02 ENCOUNTER — Emergency Department (HOSPITAL_COMMUNITY): Payer: Medicare Other

## 2011-04-02 DIAGNOSIS — G4700 Insomnia, unspecified: Secondary | ICD-10-CM | POA: Diagnosis present

## 2011-04-02 DIAGNOSIS — E119 Type 2 diabetes mellitus without complications: Secondary | ICD-10-CM | POA: Diagnosis present

## 2011-04-02 DIAGNOSIS — K219 Gastro-esophageal reflux disease without esophagitis: Secondary | ICD-10-CM | POA: Diagnosis present

## 2011-04-02 DIAGNOSIS — I509 Heart failure, unspecified: Secondary | ICD-10-CM | POA: Diagnosis present

## 2011-04-02 DIAGNOSIS — F3289 Other specified depressive episodes: Secondary | ICD-10-CM | POA: Diagnosis present

## 2011-04-02 DIAGNOSIS — J9600 Acute respiratory failure, unspecified whether with hypoxia or hypercapnia: Secondary | ICD-10-CM | POA: Diagnosis present

## 2011-04-02 DIAGNOSIS — E875 Hyperkalemia: Secondary | ICD-10-CM | POA: Diagnosis present

## 2011-04-02 DIAGNOSIS — G40909 Epilepsy, unspecified, not intractable, without status epilepticus: Secondary | ICD-10-CM | POA: Diagnosis present

## 2011-04-02 DIAGNOSIS — F329 Major depressive disorder, single episode, unspecified: Secondary | ICD-10-CM | POA: Diagnosis present

## 2011-04-02 DIAGNOSIS — J441 Chronic obstructive pulmonary disease with (acute) exacerbation: Principal | ICD-10-CM | POA: Diagnosis present

## 2011-04-02 DIAGNOSIS — Z794 Long term (current) use of insulin: Secondary | ICD-10-CM

## 2011-04-02 DIAGNOSIS — F17200 Nicotine dependence, unspecified, uncomplicated: Secondary | ICD-10-CM | POA: Diagnosis present

## 2011-04-02 DIAGNOSIS — G894 Chronic pain syndrome: Secondary | ICD-10-CM | POA: Diagnosis present

## 2011-04-02 DIAGNOSIS — E871 Hypo-osmolality and hyponatremia: Secondary | ICD-10-CM | POA: Diagnosis present

## 2011-04-02 LAB — COMPREHENSIVE METABOLIC PANEL
ALT: 9 U/L (ref 0–35)
AST: 13 U/L (ref 0–37)
Albumin: 3.2 g/dL — ABNORMAL LOW (ref 3.5–5.2)
Alkaline Phosphatase: 113 U/L (ref 39–117)
BUN: 10 mg/dL (ref 6–23)
CO2: 30 mEq/L (ref 19–32)
Calcium: 9.9 mg/dL (ref 8.4–10.5)
Chloride: 91 mEq/L — ABNORMAL LOW (ref 96–112)
Creatinine, Ser: 0.99 mg/dL (ref 0.4–1.2)
GFR calc Af Amer: >60 mL/min (ref >60)
GFR calc non Af Amer: 56 mL/min — ABNORMAL LOW (ref >60)
Glucose, Bld: 183 mg/dL — ABNORMAL HIGH (ref 70–99)
Potassium: 5.5 mEq/L — ABNORMAL HIGH (ref 3.5–5.1)
Sodium: 127 mEq/L — ABNORMAL LOW (ref 135–145)
Total Bilirubin: 0.1 mg/dL — ABNORMAL LOW (ref 0.3–1.2)
Total Protein: 6.5 g/dL (ref 6.0–8.3)

## 2011-04-02 LAB — CK TOTAL AND CKMB (NOT AT ARMC)
CK, MB: 4.2 ng/mL — ABNORMAL HIGH (ref 0.3–4.0)
Relative Index: 3 — ABNORMAL HIGH (ref 0.0–2.5)
Total CK: 139 U/L (ref 7–177)

## 2011-04-02 LAB — URINALYSIS, ROUTINE W REFLEX MICROSCOPIC
Bilirubin Urine: NEGATIVE
Glucose, UA: NEGATIVE mg/dL
Hgb urine dipstick: NEGATIVE
Ketones, ur: NEGATIVE mg/dL
Nitrite: NEGATIVE
Protein, ur: NEGATIVE mg/dL
Specific Gravity, Urine: 1.02 (ref 1.005–1.030)
Urobilinogen, UA: 0.2 mg/dL (ref 0.0–1.0)
pH: 5.5 (ref 5.0–8.0)

## 2011-04-02 LAB — CBC
HCT: 38.4 % (ref 36.0–46.0)
Hemoglobin: 12 g/dL (ref 12.0–15.0)
MCH: 24.2 pg — ABNORMAL LOW (ref 26.0–34.0)
MCHC: 31.3 g/dL (ref 30.0–36.0)
MCV: 77.4 fL — ABNORMAL LOW (ref 78.0–100.0)
Platelets: 280 10*3/uL (ref 150–400)
RBC: 4.96 MIL/uL (ref 3.87–5.11)
RDW: 14.7 % (ref 11.5–15.5)
WBC: 14.1 10*3/uL — ABNORMAL HIGH (ref 4.0–10.5)

## 2011-04-02 LAB — DIFFERENTIAL
Basophils Absolute: 0.1 10*3/uL (ref 0.0–0.1)
Basophils Relative: 0 % (ref 0–1)
Eosinophils Absolute: 0.3 10*3/uL (ref 0.0–0.7)
Eosinophils Relative: 2 % (ref 0–5)
Lymphocytes Relative: 29 % (ref 12–46)
Lymphs Abs: 4.1 10*3/uL — ABNORMAL HIGH (ref 0.7–4.0)
Monocytes Absolute: 0.8 10*3/uL (ref 0.1–1.0)
Monocytes Relative: 5 % (ref 3–12)
Neutro Abs: 8.8 10*3/uL — ABNORMAL HIGH (ref 1.7–7.7)
Neutrophils Relative %: 63 % (ref 43–77)

## 2011-04-02 LAB — PRO B NATRIURETIC PEPTIDE: Pro B Natriuretic peptide (BNP): 266.1 pg/mL — ABNORMAL HIGH (ref 0–125)

## 2011-04-02 LAB — TROPONIN I: Troponin I: <0.3 ng/mL (ref <0.30)

## 2011-04-02 NOTE — Assessment & Plan Note (Signed)
The patient was seen by Dr. Pamelia Hoit for right upper extremity pain after dislocation over a year ago.  She said it was taking care of by Dr. Rennis Chris at [facility].  She has got some residual numbness and tingling in the hand and arm and some bilateral knee pain. She rates her pain on average about 8.  She states she is here for med refill as she did not see Dr. Pamelia Hoit last month due to a death in her family.  She has no bottle with her today.  Pains remain the same 24 hours a day.  All activities aggravate, her pain medications only thing that helps.  She walks sometimes with a cane.  She does not climb steps. She can walk only about 4 minutes at a time.  She does not drive.  She is on disability.  REVIEW OF SYSTEMS:  Notable for bladder control problems, some weakness, numbness, tremors strength, tingling, trouble with ambulation, spasms, dizziness, confusion, anxiety, and depression.  She does have suicidal thoughts, no active plan.  Otherwise, she does have painful urination, poor appetite, constipation, diarrhea, vomiting, nausea, some skin breakdown, fever and chills, weight gain, night sweats, shortness of breath, and severe wheezing with respiration.  PAST MEDICAL HISTORY:  Unchanged.  SOCIAL HISTORY:  She is widowed.  She lives with her knees.  She smokes.  FAMILY HISTORY:  Significant for heart disease, lung disease, diabetes.  Physical exam shows her to walk slowly with somewhat kyphotic posture. She has got severe expiratory respiration wheezing and bilateral rales. She is alert and oriented x3.  She will appear somewhat depressed.  She is morbidly obese.  Her motor strength 5/5 with iliopsoas, quadriceps, dorsiflexor plantarflexor in upper extremities.  Her right upper right extremity is weak.  She gives away to pain with any kind of resistance, testing.  Her sensation is diminished in right upper extremity, otherwise normal.  She has diminished range of  motion in the right arm.  ASSESSMENT: 1. Chronic pain syndrome. 2. Paresthesias in the right upper extremity status post injury. 3. Depression. 4. Tobacco abuse. 5. Obesity.  PLAN: 1. We went ahead and refilled her Percocet 10/325 one p.o. q.i.d. 120     with no refill.  She did undergo UDS that will be for Dr.     Leretha Dykes review. 2. She will follow Dr. Pamelia Hoit in 1 month.  All her questions were     encouraged and answered.     Verner Mccrone L. Blima Dessert    RLW/MedQ D:  04/01/2011 14:23:31  T:  04/02/2011 02:03:56  Job #:  [id number]

## 2011-04-03 DIAGNOSIS — I369 Nonrheumatic tricuspid valve disorder, unspecified: Secondary | ICD-10-CM

## 2011-04-03 LAB — BLOOD GAS, ARTERIAL
Acid-Base Excess: 2.7 mmol/L — ABNORMAL HIGH (ref 0.0–2.0)
Bicarbonate: 28.7 mEq/L — ABNORMAL HIGH (ref 20.0–24.0)
Drawn by: 28337
FIO2: 0.21 %
O2 Saturation: 88.6 %
Patient temperature: 98.6
TCO2: 30.6 mmol/L (ref 0–100)
pCO2 arterial: 61.9 mmHg (ref 35.0–45.0)
pH, Arterial: 7.288 — ABNORMAL LOW (ref 7.350–7.400)
pO2, Arterial: 55.4 mmHg — ABNORMAL LOW (ref 80.0–100.0)

## 2011-04-03 LAB — COMPREHENSIVE METABOLIC PANEL
ALT: 9 U/L (ref 0–35)
AST: 13 U/L (ref 0–37)
Albumin: 3.2 g/dL — ABNORMAL LOW (ref 3.5–5.2)
Alkaline Phosphatase: 112 U/L (ref 39–117)
BUN: 11 mg/dL (ref 6–23)
CO2: 31 mEq/L (ref 19–32)
Calcium: 9.7 mg/dL (ref 8.4–10.5)
Chloride: 91 mEq/L — ABNORMAL LOW (ref 96–112)
Creatinine, Ser: 1.04 mg/dL (ref 0.4–1.2)
GFR calc Af Amer: >60 mL/min (ref >60)
GFR calc non Af Amer: 53 mL/min — ABNORMAL LOW (ref >60)
Glucose, Bld: 216 mg/dL — ABNORMAL HIGH (ref 70–99)
Potassium: 4.8 mEq/L (ref 3.5–5.1)
Sodium: 128 mEq/L — ABNORMAL LOW (ref 135–145)
Total Bilirubin: 0.2 mg/dL — ABNORMAL LOW (ref 0.3–1.2)
Total Protein: 6.5 g/dL (ref 6.0–8.3)

## 2011-04-03 LAB — HEMOGLOBIN A1C
Hgb A1c MFr Bld: 10.5 % — ABNORMAL HIGH (ref <5.7)
Mean Plasma Glucose: 255 mg/dL — ABNORMAL HIGH (ref <117)

## 2011-04-03 LAB — TSH: TSH: 0.938 u[IU]/mL (ref 0.350–4.500)

## 2011-04-03 LAB — LIPID PANEL
Cholesterol: 224 mg/dL — ABNORMAL HIGH (ref 0–200)
HDL: 43 mg/dL (ref >39)
LDL Cholesterol: 131 mg/dL — ABNORMAL HIGH (ref 0–99)
Total CHOL/HDL Ratio: 5.2 RATIO
Triglycerides: 249 mg/dL — ABNORMAL HIGH (ref <150)
VLDL: 50 mg/dL — ABNORMAL HIGH (ref 0–40)

## 2011-04-03 LAB — GLUCOSE, CAPILLARY
Glucose-Capillary: 237 mg/dL — ABNORMAL HIGH (ref 70–99)
Glucose-Capillary: 181 mg/dL — ABNORMAL HIGH (ref 70–99)
Glucose-Capillary: 235 mg/dL — ABNORMAL HIGH (ref 70–99)
Glucose-Capillary: 185 mg/dL — ABNORMAL HIGH (ref 70–99)
Glucose-Capillary: 292 mg/dL — ABNORMAL HIGH (ref 70–99)

## 2011-04-03 LAB — CBC
HCT: 37.2 % (ref 36.0–46.0)
Hemoglobin: 11.4 g/dL — ABNORMAL LOW (ref 12.0–15.0)
MCH: 23.7 pg — ABNORMAL LOW (ref 26.0–34.0)
MCHC: 30.6 g/dL (ref 30.0–36.0)
MCV: 77.3 fL — ABNORMAL LOW (ref 78.0–100.0)
Platelets: 271 10*3/uL (ref 150–400)
RBC: 4.81 MIL/uL (ref 3.87–5.11)
RDW: 14.9 % (ref 11.5–15.5)
WBC: 12.4 10*3/uL — ABNORMAL HIGH (ref 4.0–10.5)

## 2011-04-03 LAB — CARDIAC PANEL(CRET KIN+CKTOT+MB+TROPI)
CK, MB: 3.9 ng/mL (ref 0.3–4.0)
CK, MB: 3.5 ng/mL (ref 0.3–4.0)
Relative Index: 3.1 — ABNORMAL HIGH (ref 0.0–2.5)
Relative Index: 3.3 — ABNORMAL HIGH (ref 0.0–2.5)
Total CK: 126 U/L (ref 7–177)
Total CK: 105 U/L (ref 7–177)
Troponin I: <0.3 ng/mL (ref <0.30)
Troponin I: <0.3 ng/mL (ref <0.30)

## 2011-04-03 LAB — MRSA PCR SCREENING: MRSA by PCR: NEGATIVE

## 2011-04-03 LAB — PHOSPHORUS: Phosphorus: 4.7 mg/dL — ABNORMAL HIGH (ref 2.3–4.6)

## 2011-04-03 LAB — MAGNESIUM: Magnesium: 2.2 mg/dL (ref 1.5–2.5)

## 2011-04-04 ENCOUNTER — Inpatient Hospital Stay (HOSPITAL_COMMUNITY): Payer: Medicare Other

## 2011-04-04 LAB — CBC
HCT: 36 % (ref 36.0–46.0)
Hemoglobin: 11.3 g/dL — ABNORMAL LOW (ref 12.0–15.0)
MCH: 24 pg — ABNORMAL LOW (ref 26.0–34.0)
MCHC: 31.4 g/dL (ref 30.0–36.0)
MCV: 76.6 fL — ABNORMAL LOW (ref 78.0–100.0)
Platelets: 279 10*3/uL (ref 150–400)
RBC: 4.7 MIL/uL (ref 3.87–5.11)
RDW: 14.5 % (ref 11.5–15.5)
WBC: 13.1 10*3/uL — ABNORMAL HIGH (ref 4.0–10.5)

## 2011-04-04 LAB — COMPREHENSIVE METABOLIC PANEL
ALT: 10 U/L (ref 0–35)
AST: 13 U/L (ref 0–37)
Albumin: 3.1 g/dL — ABNORMAL LOW (ref 3.5–5.2)
Alkaline Phosphatase: 109 U/L (ref 39–117)
BUN: 15 mg/dL (ref 6–23)
CO2: 29 mEq/L (ref 19–32)
Calcium: 9.5 mg/dL (ref 8.4–10.5)
Chloride: 95 mEq/L — ABNORMAL LOW (ref 96–112)
Creatinine, Ser: 0.88 mg/dL (ref 0.4–1.2)
GFR calc Af Amer: >60 mL/min (ref >60)
GFR calc non Af Amer: >60 mL/min (ref >60)
Glucose, Bld: 353 mg/dL — ABNORMAL HIGH (ref 70–99)
Potassium: 6 mEq/L — ABNORMAL HIGH (ref 3.5–5.1)
Sodium: 130 mEq/L — ABNORMAL LOW (ref 135–145)
Total Bilirubin: 0.2 mg/dL — ABNORMAL LOW (ref 0.3–1.2)
Total Protein: 6.2 g/dL (ref 6.0–8.3)

## 2011-04-04 LAB — BLOOD GAS, ARTERIAL
Acid-Base Excess: 7.4 mmol/L — ABNORMAL HIGH (ref 0.0–2.0)
Bicarbonate: 31.8 mEq/L — ABNORMAL HIGH (ref 20.0–24.0)
Drawn by: 31996
O2 Content: 2 L/min
O2 Saturation: 89.7 %
Patient temperature: 98.6
TCO2: 33.3 mmol/L (ref 0–100)
pCO2 arterial: 49 mmHg — ABNORMAL HIGH (ref 35.0–45.0)
pH, Arterial: 7.428 — ABNORMAL HIGH (ref 7.350–7.400)
pO2, Arterial: 53.8 mmHg — ABNORMAL LOW (ref 80.0–100.0)

## 2011-04-04 LAB — POCT I-STAT 3, ART BLOOD GAS (G3+)
Acid-Base Excess: 1 mmol/L (ref 0.0–2.0)
Bicarbonate: 28.4 mEq/L — ABNORMAL HIGH (ref 20.0–24.0)
O2 Saturation: 96 %
Patient temperature: 37
TCO2: 30 mmol/L (ref 0–100)
pCO2 arterial: 56.4 mmHg — ABNORMAL HIGH (ref 35.0–45.0)
pH, Arterial: 7.31 — ABNORMAL LOW (ref 7.350–7.400)
pO2, Arterial: 95 mmHg (ref 80.0–100.0)

## 2011-04-04 LAB — GLUCOSE, CAPILLARY
Glucose-Capillary: 425 mg/dL — ABNORMAL HIGH (ref 70–99)
Glucose-Capillary: 355 mg/dL — ABNORMAL HIGH (ref 70–99)
Glucose-Capillary: 300 mg/dL — ABNORMAL HIGH (ref 70–99)

## 2011-04-05 ENCOUNTER — Inpatient Hospital Stay (HOSPITAL_COMMUNITY): Payer: Medicare Other

## 2011-04-05 LAB — COMPREHENSIVE METABOLIC PANEL
ALT: 9 U/L (ref 0–35)
AST: 12 U/L (ref 0–37)
Albumin: 2.9 g/dL — ABNORMAL LOW (ref 3.5–5.2)
Alkaline Phosphatase: 108 U/L (ref 39–117)
BUN: 15 mg/dL (ref 6–23)
CO2: 33 mEq/L — ABNORMAL HIGH (ref 19–32)
Calcium: 9.4 mg/dL (ref 8.4–10.5)
Chloride: 97 mEq/L (ref 96–112)
Creatinine, Ser: 0.73 mg/dL (ref 0.4–1.2)
GFR calc Af Amer: >60 mL/min (ref >60)
GFR calc non Af Amer: >60 mL/min (ref >60)
Glucose, Bld: 229 mg/dL — ABNORMAL HIGH (ref 70–99)
Potassium: 3.8 mEq/L (ref 3.5–5.1)
Sodium: 137 mEq/L (ref 135–145)
Total Bilirubin: 0.1 mg/dL — ABNORMAL LOW (ref 0.3–1.2)
Total Protein: 5.8 g/dL — ABNORMAL LOW (ref 6.0–8.3)

## 2011-04-05 LAB — GLUCOSE, CAPILLARY
Glucose-Capillary: 318 mg/dL — ABNORMAL HIGH (ref 70–99)
Glucose-Capillary: 271 mg/dL — ABNORMAL HIGH (ref 70–99)
Glucose-Capillary: 206 mg/dL — ABNORMAL HIGH (ref 70–99)
Glucose-Capillary: 134 mg/dL — ABNORMAL HIGH (ref 70–99)
Glucose-Capillary: 226 mg/dL — ABNORMAL HIGH (ref 70–99)
Glucose-Capillary: 292 mg/dL — ABNORMAL HIGH (ref 70–99)

## 2011-04-05 LAB — BLOOD GAS, ARTERIAL
Acid-Base Excess: 6.8 mmol/L — ABNORMAL HIGH (ref 0.0–2.0)
Acid-Base Excess: 5.2 mmol/L — ABNORMAL HIGH (ref 0.0–2.0)
Bicarbonate: 31 mEq/L — ABNORMAL HIGH (ref 20.0–24.0)
Bicarbonate: 28.7 mEq/L — ABNORMAL HIGH (ref 20.0–24.0)
Drawn by: 24486
Drawn by: 22563
FIO2: 0.21 %
O2 Content: 2 L/min
O2 Saturation: 94.5 %
O2 Saturation: 90.4 %
Patient temperature: 98.6
Patient temperature: 98.6
TCO2: 32.4 mmol/L (ref 0–100)
TCO2: 29.9 mmol/L (ref 0–100)
pCO2 arterial: 45.8 mmHg — ABNORMAL HIGH (ref 35.0–45.0)
pCO2 arterial: 38.8 mmHg (ref 35.0–45.0)
pH, Arterial: 7.445 — ABNORMAL HIGH (ref 7.350–7.400)
pH, Arterial: 7.482 — ABNORMAL HIGH (ref 7.350–7.400)
pO2, Arterial: 66.2 mmHg — ABNORMAL LOW (ref 80.0–100.0)
pO2, Arterial: 54.1 mmHg — ABNORMAL LOW (ref 80.0–100.0)

## 2011-04-05 LAB — CBC
HCT: 34.3 % — ABNORMAL LOW (ref 36.0–46.0)
Hemoglobin: 10.8 g/dL — ABNORMAL LOW (ref 12.0–15.0)
MCH: 23.7 pg — ABNORMAL LOW (ref 26.0–34.0)
MCHC: 31.5 g/dL (ref 30.0–36.0)
MCV: 75.2 fL — ABNORMAL LOW (ref 78.0–100.0)
Platelets: 304 10*3/uL (ref 150–400)
RBC: 4.56 MIL/uL (ref 3.87–5.11)
RDW: 14.4 % (ref 11.5–15.5)
WBC: 18.3 10*3/uL — ABNORMAL HIGH (ref 4.0–10.5)

## 2011-04-06 LAB — POCT I-STAT 3, ART BLOOD GAS (G3+)
Acid-Base Excess: 7 mmol/L — ABNORMAL HIGH (ref 0.0–2.0)
Bicarbonate: 30.8 mEq/L — ABNORMAL HIGH (ref 20.0–24.0)
O2 Saturation: 92 %
Patient temperature: 37
TCO2: 32 mmol/L (ref 0–100)
pCO2 arterial: 40.5 mmHg (ref 35.0–45.0)
pH, Arterial: 7.489 — ABNORMAL HIGH (ref 7.350–7.400)
pO2, Arterial: 59 mmHg — ABNORMAL LOW (ref 80.0–100.0)

## 2011-04-06 LAB — CBC
HCT: 36.4 % (ref 36.0–46.0)
Hemoglobin: 11.9 g/dL — ABNORMAL LOW (ref 12.0–15.0)
MCH: 24.6 pg — ABNORMAL LOW (ref 26.0–34.0)
MCHC: 32.7 g/dL (ref 30.0–36.0)
MCV: 75.4 fL — ABNORMAL LOW (ref 78.0–100.0)
Platelets: 318 10*3/uL (ref 150–400)
RBC: 4.83 MIL/uL (ref 3.87–5.11)
RDW: 14.9 % (ref 11.5–15.5)
WBC: 15.3 10*3/uL — ABNORMAL HIGH (ref 4.0–10.5)

## 2011-04-06 LAB — GLUCOSE, CAPILLARY
Glucose-Capillary: 196 mg/dL — ABNORMAL HIGH (ref 70–99)
Glucose-Capillary: 250 mg/dL — ABNORMAL HIGH (ref 70–99)
Glucose-Capillary: 167 mg/dL — ABNORMAL HIGH (ref 70–99)
Glucose-Capillary: 191 mg/dL — ABNORMAL HIGH (ref 70–99)

## 2011-04-06 LAB — COMPREHENSIVE METABOLIC PANEL
ALT: 7 U/L (ref 0–35)
AST: 9 U/L (ref 0–37)
Albumin: 2.8 g/dL — ABNORMAL LOW (ref 3.5–5.2)
Alkaline Phosphatase: 95 U/L (ref 39–117)
BUN: 15 mg/dL (ref 6–23)
CO2: 31 mEq/L (ref 19–32)
Calcium: 9.7 mg/dL (ref 8.4–10.5)
Chloride: 100 mEq/L (ref 96–112)
Creatinine, Ser: 0.95 mg/dL (ref 0.4–1.2)
GFR calc Af Amer: >60 mL/min (ref >60)
GFR calc non Af Amer: 59 mL/min — ABNORMAL LOW (ref >60)
Glucose, Bld: 218 mg/dL — ABNORMAL HIGH (ref 70–99)
Potassium: 3.9 mEq/L (ref 3.5–5.1)
Sodium: 138 mEq/L (ref 135–145)
Total Bilirubin: 0.1 mg/dL — ABNORMAL LOW (ref 0.3–1.2)
Total Protein: 5.9 g/dL — ABNORMAL LOW (ref 6.0–8.3)

## 2011-04-07 LAB — GLUCOSE, CAPILLARY
Glucose-Capillary: 101 mg/dL — ABNORMAL HIGH (ref 70–99)
Glucose-Capillary: 121 mg/dL — ABNORMAL HIGH (ref 70–99)

## 2011-04-09 NOTE — Assessment & Plan Note (Signed)
Yvonne [name] is a 65 year old widowed LUMBI [place].  She is referred  by Dr. Reche Dixon.  She has multiple chronic pain complaints.  She was seen  initially for the first time on Apr 17, 2009.   She had multiple chronic pain complaints including lumbago, right  shoulder pain, bilateral upper extremity pain, and hand pain.  She also  underwent a fairly significant left groin operation due to the  fasciitis, which required wide surgical debridement and she lost a  significant amount of soft tissue in her left groin area approximately 5  years ago.   Her average pain is about 8 on a scale of 10.  Sleep tends to be poor.  Pain is described as sharp, dull, aching in nature, variably pain is  worse with activities and improved such as walking, bending, standing,  and improves with rest.   MEDICATIONS:  She gets good relief with current medications.   Medications, which are prescribed to her through Dr. Tyler Deis Id-Din;  oxycodone 10/325 up to 3 times per day.   FUNCTIONAL STATUS:  She is able to walk 10 minutes at a time.  She does  not climb stairs nor drive.  She is independent with self-care.  She  admits to some weakness, numbness, and trouble walking.  Denies  depression, anxiety, or suicidal ideation.  Denies problems controlling  bowel or bladder.   Review of systems also positive for a variations in blood pressure, poor  appetite, and limb swelling.   No other change in past medical, social, or family history since last  visit.   Laboratory results from last visit showed that her urine drug screen was  consistent with respect to the range of medications.  We also ordered  sed rate, rheumatoid factor, and antinuclear antibody.  Results are  attached to the chart.  Sed rate was 6; normal 0-22.  Rheumatoid factor  was 56; normal 0-20, and antinuclear antibody was positive, which was  abnormal.   Radiographs, which were done at the last visit are also attached to the  chart.  Lumbar  radiograph showed transitional tight L5 vertebral body,  mild degenerative facet disease in the lower spine without instability,  bilateral hand radiographs 3 views showed no acute findings or  significant arthritic change.  Right elbow also showed no significant  arthritic change and essentially negative radiographs as well.   PHYSICAL EXAMINATION:  VITAL SIGNS:  Today blood pressure is 144/81,  pulse 82, respiration 18, and 96% saturated on room air.  GENERAL:  She is well-developed, obese female, who does not appear in  any distress.  She is oriented x3.  Speech is clear.  Affect is bright.  She is alert, cooperative, and pleasant.  Follows commands without  difficulty and answers my questions appropriately.  NEUROLOGIC:  Cranial nerves and coordination are intact.  Reflexes are  1+ in upper and lower extremities without abnormal tone, clonus, or  tremors.   Sensation is intact.   Limitations in cervical range of motion is full.  Shoulder range of  motion especially on the right are noted.  Limitations in lumbar motion  in all planes are noted as well.  Tandem gait and Romberg test are  performed adequately.  Limitations in lumbar motion noted in all planes.   Bilateral upper extremities are examined again.  She has a significant  amount of synovitis in the first and second metacarpal phalangeal joints  and bilaterally mild tenderness is noted.  She also has  a small nodule  over the right elbow, which has had just came up in the last couple of  weeks.   IMPRESSION:  1. Positive rheumatoid factor and antinuclear antibody.  2. Chronic hand pain and right elbow pain.  3. Chronic right shoulder pain most likely secondary to bursitis and      shoulder tendonitis.  4. Chronic low back pain most likely related to facet arthropathy.  5. Status post significant left groin debridement 5 years ago with      significant reduction in soft tissue in this area.   PLAN:  1. We will obtain  cervical radiograph obtained in flexion/extension      films.  2. Phone call over to primary care doctor, Dr. Onalee Hua Talbot's office      spoke with Dr. Tyler Deis Id-Din regarding positive ANA and RF.  She      states that she will make appropriate referral for Ms. Delford Field      through their office.  I also discussed that taking over her      Percocet prescription for that office as well, a prescription for      Percocet 10/325 one p.o. t.i.d. was written for Ms. Delford Field today.      Again, we discussed the importance of taking medications as      prescribed, bringing bottles in for pill counts and to document      fill date.  We will see her back in a month.  This was also      discussed with Ceasar Mons, who Ms. Lonon requested to be in      the room toward the end of our visits to review our treatment plan.      We will see her back in a month.           ______________________________  Brantley Stage, M.D.     DMK/MedQ  D:  05/22/2009 13:51:59  T:  05/23/2009 04:47:09  Job #:  [id number]

## 2011-04-09 NOTE — Discharge Summary (Signed)
Yvonne [name], Yvonne [name]                ACCOUNT NO.:  [id number]   MEDICAL RECORD NO.:  [id number]          PATIENT TYPE:  INP   LOCATION:  1519                         FACILITY:  [facility]   PHYSICIAN:  Hillery Aldo, M.D.   DATE OF BIRTH:  [birth year]   DATE OF ADMISSION:  05/08/2008  DATE OF DISCHARGE:                               DISCHARGE SUMMARY   PRIMARY CARE PHYSICIAN:  Dr. Elby Beck of [facility].   DISCHARGE DIAGNOSES:  1. Left lower lobe pneumonia.  2. Hyponatremia.  3. Microcytic anemia/Anemia of chronic disease.  4. Morbid obesity.  5. Delirium.  6. Diabetes mellitus.  7. Chronic obstructive pulmonary disease.  8. Constipation  9. History of depression.  10.History of anxiety.  11.Gastroesophageal reflux disease.  12.Osteoarthritis.  13.Left sphenoid sinusitis.   DISCHARGE MEDICATIONS:  1. Citalopram 20 mg daily (decreased from 40 mg).  2. Singulair 10 mg daily.  3. Alprazolam 1 mg t.i.d.  4. Ambien 10 mg nightly p.r.n.  5. Ranitidine 150 mg b.i.d.  6. Lantus insulin 15 units subcutaneously nightly.  7. Avelox 400 mg daily x 10 more days.   CONSULTATIONS:  None.   BRIEF ADMISSION HPI:  The patient is a 65 year old female who presented  to the hospital with a chief complaint of chills, sweats, and symptoms  suggestive of delirium.  She was admitted for further evaluation and  workup.  For the full details, please see the dictated report done by  Dr. Roxan Hockey.   PROCEDURES AND DIAGNOSTIC STUDIES:  1. Chest x-ray on May 08, 2008 showed asymmetric haziness in the      lingula and left lower lobe suspicious for evolving infiltrate.  2. CT of the head on May 08, 2008 showed no acute intracranial      abnormality.  Mild advanced cerebral atrophy.  Left sphenoid fluid      suggestive of sinusitis.   DISCHARGE LABORATORY VALUES:  Sodium 130, potassium 4.3, chloride 102,  bicarb 24, glucose 117, BUN 12, creatinine 0.91.  White blood cell count  8.7, hemoglobin 9.4,  hematocrit 29.3, platelets 252, with an MCV of  76.4.   HOSPITAL COURSE BY PROBLEM:  Problem #1:  Left lower lobe pneumonia with  sinusitis.  The patient was admitted and has completed 5 days of  antibiotic therapy, currently with Avelox.  She will complete a 2-week  course of therapy for treatment of pneumonia and sinusitis.  She is  afebrile and her white blood cell count has normalized.   Problem #2:  Hyponatremia.  The patient's admission sodium was low at  127.  She has a history of hyponatremia in the past.  It is felt that  her hyponatremia may be exacerbated by her serotonin reuptake inhibitor.  Given this, her dosage was reduced from 40 mg of citalopram to 20 mg.  Her sodium level has come up to 130.   Problem #3:  Microcytic anemia.  The patient did have an anemia panel  done.  There is no evidence of B12 or folate deficiency.  Her  reticulocyte count was low, but her ferritin was high; and  she had low  iron, low total iron binding capacity with a low percent saturation all  consistent with anemia of chronic disease.   Problem #4:  Morbid obesity. The patient was seen in consultation with  the nutritionist.  She was able to verbalize an appropriate diabetic  diet.  She was encouraged to continue with a weight loss efforts.   Problem #5:  Delirium.  The patient's delirium resolved.  It was felt to  be related to her underlying pneumonia.   Problem #6:  Diabetes.  The patient was put on a combination of Lantus  and sliding scale insulin.  She normally uses Lantus mono therapy at  home.  Her hemoglobin A1c was found to 6.6 indicating adequate control  on this outpatient regimen.   Problem #7:  Chronic obstructive pulmonary disease.  The patient's  respiratory status has remained stable.   Problem #8: Constipation.  The patient did have problems with  constipation during the course of her hospitalization.  She responded to  MiraLax and Dulcolax suppositories.    DISPOSITION:  The patient is approaching medical stability, and will be  discharged in the morning if she remains stable.      Hillery Aldo, M.D.  Electronically Signed     CR/MEDQ  D:  05/13/2008  T:  05/13/2008  Job:  [id number]   cc:   Dr. Elby Beck

## 2011-04-09 NOTE — H&P (Signed)
NAME:  Yvonne [name], Yvonne [name]                ACCOUNT NO.:  [id number]   MEDICAL RECORD NO.:  [id number]          PATIENT TYPE:  INP   LOCATION:  1439                         [facility]ACILITY:  [facility]   PHYSICIAN:  Hillery Aldo, M.D.   DATE OF BIRTH:  13-Nov-1946   DATE OF ADMISSION:  09/03/2007  DATE OF DISCHARGE:                              HISTORY & PHYSICAL   PRIMARY CARE PHYSICIAN:  Dr. Emeline Darling with [facility].   CHIEF COMPLAINT:  Fever, diarrhea, acute on chronic shoulder, arm, and  pleuritic chest pain radiating to the back, on the right side.   HISTORY OF PRESENT ILLNESS:  The patient is a 65 year old female with a  two to three-week history of right shoulder pain.  The pain radiates  down her right arm and over the past 24 hours has become significantly  worse and has developed a pleuritic component, whereby increases with  deep respirations.  The pain is in her right chest, radiating to the  back now.  She also complains of flu-like symptoms with rhinorrhea,  fever, chills, cough productive of yellow sputum and a body aches.  She  also has been having loose stools.  She denies any melena or  hematochezia.  She has had some shortness of breath.  Upon initial  evaluation in the emergency department, she was found to have evidence  of a right upper lobe pneumonia, and we are therefore admitting her for  further treatment.   PAST MEDICAL HISTORY:  1. Allergic rhinitis.  2. Diabetes mellitus.  3. History of necrotizing fasciitis, status post surgical      intervention.  4. Chronic obstructive pulmonary disease.  5. Anxiety.  6. Depression.  7. Gastroesophageal reflux disease.  8. Dyslipidemia.  9. History of renal insufficiency, secondary to contrast dye.  10.History of cellulitis.  11.History of osteomyelitis.  12.Osteoarthritis.  13.History of pneumonia.  14.History of pyelonephritis.  15.Status post hysterectomy in 1972.  16.Status post bilateral inguinal resection for questionable  neoplasm.   FAMILY HISTORY:  The patient's mother died of ovarian cancer at age 35.  The patient's father died at 82 from a myocardial infarction.  He also  had an unspecified cancer.  She has 19 siblings, many of whom suffer  with heart disease.   SOCIAL HISTORY:  The patient is widowed.  She smokes about a half-a-pack  to a third pack of tobacco daily and has done so for the past 43 years.  She denies any alcohol or drug use.  She is disabled.  She was a  housewife.   DRUG ALLERGIES:  CODEINE CAUSES NAUSEA.  ASPIRIN CAUSES GI UPSET BUT NO  SPECIFIC ALLERGIES.   MEDICATIONS:  1. Lantus insulin 25 units daily.  2. Sliding-scale insulin b.i.d.  3. Ambien 10 mg q.h.s.  4. Citalopram 40 mg daily.  5. Zantac 150 mg b.i.d.  6. Singulair 10 mg daily.  7. Xanax 0.5 mg q.i.d.  8. Ferocon 1 tablet daily.   REVIEW OF SYSTEMS:  As noted in the elements of the HPI above.  A  comprehensive 14-point review of systems is otherwise negative.  PHYSICAL EXAM:  VITAL SIGNS:  Temperature 97.3, pulse 74, respirations  20, blood pressure 103/60, O2 saturation 96% on 2 liters.  GENERAL:  This is a well-developed, well-nourished female who is  slightly obese and in no acute distress.  HEENT:  Normocephalic, atraumatic, PERRL, EOMI.  Oropharynx clear.  NECK:  Supple, no thyromegaly, there is tender lymphadenopathy down the  right anterior cervical chain.  No JVD.  CHEST:  Lungs clear to auscultation bilaterally with good air movement.  HEART:  Regular rate, rhythm.  No murmurs, rubs or gallops.  ABDOMEN:  Soft, nontender, nondistended with normoactive bowel sounds.  EXTREMITIES:  Trace edema bilaterally.  SKIN:  Diaphoretic.  NEUROLOGIC:  The patient is alert and oriented x3.  Cranial nerves II-  XII grossly intact.  Moves all extremities x4 with equal strength.   DATA REVIEW:  EKG shows normal sinus rhythm with left axis deviation.  There is no ST or T-wave changes.   Chest x-ray shows chronic  bronchial thickening and bibasilar  atelectasis.   CT angiogram of the chest shows no evidence of pulmonary embolism.  There is right upper lobe focal posterior air space disease, consistent  with bronchopneumonia.  Bibasilar atelectasis.   LABORATORY DATA:  White blood cell count of 16.9, hemoglobin 12.7,  hematocrit 39.8, platelets 449 with an absolute neutrophil count of  11.6.  Sodium is 131, potassium 4.0, chloride 99, bicarb 24, BUN 5,  creatinine 1.09, glucose 57.  D-dimer was 0.53.  Point of care cardiac  markers are negative x1.  Urinalysis negative for nitrites and leukocyte  esterase.   ASSESSMENT/PLAN:  1. Chest pain, pleuritic/community-acquired pneumonia:  Will admit the      patient and start her on IV Rocephin and azithromycin.  We will      attempt to obtain sputum for Gram stain and culture.  We will      administer Mucinex to help with a muco-lysis.  We will administer      Tylenol for fever.  Will monitor her leukocytosis for resolution.  2. Hyponatremia:  The patient's hyponatremia is likely reflective of      her acute lung process.  We will monitor her electrolytes.  3. Hypoglycemia:  The patient currently has low blood glucose.  We      will start her on sliding scale insulin only and re-institute      Lantus when it is clear what her glycemic pattern will be.  Will      check a hemoglobin A1c, to determine her overall glycemic control.  4. History of chronic obstructive pulmonary disease.  The patient has      no active bronchospasm.  Will monitor closely and use Xopenex as      needed for any bronchospasms she develops.  5. Prophylaxis:  Initiate GI prophylaxis with Protonix and DVT      prophylaxis with Lovenox.      Hillery Aldo, M.D.  Electronically Signed     CR/MEDQ  D:  09/03/2007  T:  09/04/2007  Job:  [id number]

## 2011-04-09 NOTE — Discharge Summary (Signed)
Yvonne [name], Yvonne [name] NO.:  [id number]   MEDICAL RECORD NO.:  [id number]          PATIENT TYPE:  INP   LOCATION:  1439                         FACILITY:  [facility]   PHYSICIAN:  Elliot Cousin, M.D.    DATE OF BIRTH:  March 05, 1946   DATE OF ADMISSION:  09/03/2007  DATE OF DISCHARGE:  09/07/2007                               DISCHARGE SUMMARY   DISCHARGE DIAGNOSES:  1. Posterior right upper lobe air space opacity along the right upper      lobe posterior segmental bronchus, suspicious for early      bronchopneumonia per CT scan of the chest.  2. Tobacco abuse with radiographic findings consistent with chronic      obstructive pulmonary disease.  3. Type 2 diabetes mellitus with several episodes of hypoglycemia.  4. Hyponatremia at the time of hospital admission, completely resolved      with IV fluid volume repletion.  5. One episode of bradycardia with a heart rate falling to 38 while      sleeping.  The bradycardia did not persist during the hospital      course.   DISCHARGE MEDICATIONS:  1. Azithromycin (Z-Pak) take as directed over the next 5 days.  2. Albuterol inhaler 2 puffs q.4 h. as needed.  3. Percocet 5 mg q.4-6 h. as needed for pain.  4. Decrease the Lantus to 15 units daily.  5. Celexa 40 mg daily.  6. Ambien 10 mg daily.  7. Zantac 150 mg b.i.d.  8. Singulair 10 mg daily.  9. Xanax 0.5 mg q.i.d.  10.Ferocon 1 tablet daily.   DISCHARGE DISPOSITION:  The patient was discharged to home in improved  and stable condition on September 07, 2007.  She was advised to followup  with her primary care physician Dr. Emeline Darling in 1 week.   PROCEDURES PERFORMED:  1. Chest x-ray performed on September 06, 2007.  The results revealed no      acute findings although with evidence of chronic bronchitis.  2. CT scan of the chest on September 03, 2007.  The results revealed      negative for embolism, bibasilar atelectasis, and right upper lobe      posterior segmental air space  disease suspicious for an early      bronchopneumonia.   HISTORY OF PRESENT ILLNESS:  The patient is a 65 year old woman with a  past medical history significant for type 2 diabetes mellitus, chronic  obstructive pulmonary disease, and chronic anxiety.  She presented to  the emergency department on September 03, 2007 with a chief complaint of  subjective fever, acute-on-chronic right shoulder pain, and pleuritic  chest pain.  When she was evaluated in the emergency department, she was  noted to be afebrile and hemodynamically stable.  However, her white  blood cell count was elevated at 16.9.  Her chest x-ray revealed chronic  bronchial thickening and bibasilar atelectasis.  However, a CT scan of  the chest was ordered to rule out PE.  The CT scan was negative for PE.  However, it did reveal right upper lobe bronchopneumonia.  The  patient  was, therefore, admitted for further evaluation and management.   HOSPITAL COURSE:  Problem #1:  RIGHT-SIDED BRONCHOPNEUMONIA SUPERIMPOSED  ON COPD.  The patient was started on empiric antibiotic treatment with  Rocephin and azithromycin after blood cultures were obtained.  Symptomatic treatment was started with Robitussin as needed, Tussionex  as needed, and scheduled Mucinex b.i.d..  The patient was also started  on nebulizer treatments with albuterol and Atrovent.  Over the course of  the hospitalization, the patient improved symptomatically.  She remained  afebrile during the hospital course.  Sputum cultures were ordered as  well, and the results are still pending.  However, the gram smear  revealed a few gram-positive cocci in chains and in clusters.  Her blood  cultures remained negative during the hospital course.  Her white blood  cell count normalized at 9.3.  A follow up chest x-ray on September 06, 2007 revealed no evidence for air space disease; however, there was  evidence of chronic bronchitis.  The patient received a total of 4 days  of  Rocephin and azithromycin.  She will be sent home on azithromycin for  5 more days.   Problem #2: TOBACCO ABUSE.  The patient was counseled on tobacco  cessation during the hospital course.   Problem #3:  TYPE 2 DIABETES MELLITUS WITH ASYMPTOMATIC HYPOGLYCEMIA.  The patient was started on sliding scale insulin q.a.c. and nightly.  The Lantus was withheld during the hospital course.  The patient's  capillary blood glucose fell to the 40s and 50s on at least a couple of  occasions.  She was treated appropriately with dextrose and snacks.  Subsequently, D5 (dextrose) was added to the IV fluids.  After  approximately 24 hours, the dextrose was discontinued.  Prior to  hospital discharge, the patient was advised to decrease the dose of  Lantus to 15 units subcu daily  (her usual dose is 25 mg daily).  The  patient does not take sliding scale NovoLog on a regular basis.  Further  management will be deferred to her primary care physician Dr. Emeline Darling.      Elliot Cousin, M.D.  Electronically Signed     DF/MEDQ  D:  09/07/2007  T:  09/08/2007  Job:  [id number]   cc:   [facility] Dr. Emeline Darling

## 2011-04-09 NOTE — H&P (Signed)
NAME:  Yvonne [name], Yvonne [name] NO.:  [id number]   MEDICAL RECORD NO.:  [id number]          PATIENT TYPE:  EMS   LOCATION:  ED                           FACILITY:  [facility]   PHYSICIAN:  Michaelyn Barter, M.D. DATE OF BIRTH:  1946-04-09   DATE OF ADMISSION:  05/08/2008  DATE OF DISCHARGE:                              HISTORY & PHYSICAL   PRIMARY CARE PHYSICIAN:  Dr. Emeline Darling of [facility].   CHIEF COMPLAINT:  I feel sick.   HISTORY OF PRESENT ILLNESS:  Ms. Chahal is a 65 year old female with a  past medical history of diabetes mellitus and COPD, who states that she  started feeling sick the end of last week.  Her symptoms are very  vaguely described.  She complains of occasional chills, sweats and  states that she feels somewhat disorganized in her brain.  Two nights  ago she states she saw something that was not actually there in her home  She is not sure exactly what it was.  She had at lest one episode of  emesis last night.  She also complains of feeling febrile approximately  three days ago.  She denies having a cough, no shortness of breath.  She  stated that she thought she had the swine flu.  She lives alone and  there are no sick contacts at home.  She states that up until 3 days  ago, she was able to ambulate without assistance.  However, over the  past 3 days, her legs have become so weak that now she has to crawl  around her home in order to move from place to place  Yesterday while in  the bathroom, she fell.  However, she was so weak that she could not  pick herself up from the ground.   PAST MEDICAL HISTORY:  1. Allergic rhinitis.  2. Diabetes mellitus.  3. Necrotizing fasciitis.  4. COPD.  5. Anxiety.  6. Depression.  7. GERD.  8. Dyslipidemia.  9. Renal insufficiency associated with contrast dye.  10.Cellulitis.  11.Osteomyelitis.  12.Osteoarthritis.  13.Pneumonia.  14.Pyelonephritis.  15.Hyponatremia.  16.Bradycardia.   PAST SURGICAL HISTORY:  1. Hysterectomy.  2. Bilateral inguinal resection.  3. Incision and drainage of left labial abscess secondary to      necrotizing fasciitis involving the labia and mons completed      August 18, 2004 by Dr. Carey Bullocks.   ALLERGIES:  1. CODEINE.  2. VICODIN.  3. ASPIRIN.   CURRENT MEDICATIONS:  1. Citalopram HBr 40 mg daily.  2. Singulair 10 mg daily.  3. Alprazolam 1 mg t.i.d.  4. Zolpidem tartrate 10 mg daily.  5. Ranitidine 150 mg p.o. b.i.d.  6. Lantus insulin used as a sliding scale.   FAMILY HISTORY:  Mother died from ovarian cancer at the age of 64.  Father died at age 64 secondary to MI.   Cigarettes:  The patient has smoked up to a half pack of cigarettes per  day, and had done so previously for 43 years.  Alcohol has been denied  in the past.   REVIEW OF SYSTEMS:  As  per HPI.  Otherwise, all other systems are  negative.   PHYSICAL EXAMINATION:  GENERAL:  The patient is awake.  She is  cooperative.  She is in no obvious respiratory distress  Her temperature  is 103.5.  Blood pressure 138/66, heart rate 92, respirations 20.  O2  saturation 95%.  HEENT:  Normocephalic, atraumatic, anicteric.  Extraocular movements are  intact.  Oral mucosa is pink.  No thrush, no exudates.  NECK:  Supple.  No JVD, no lymphadenopathy.  No thyromegaly.  CARDIAC:  Heart sounds are somewhat distant, but S1, S2 can be  appreciated.  RESPIRATORY:  No crackles or wheezes.  ABDOMEN:  Flat, soft, nontender, nondistended.  EXTREMITIES:  No distal leg edema.  NEUROLOGICALLY:  The patient is alert and oriented x3.  MUSCULOSKELETAL:  5/5 upper and lower extremity strength.   LABORATORY DATA:  The patient's sodium is 127.  Her potassium is 4.1,  chloride 92, CO2 is 25, glucose 102, BUN 11, creatinine 1.06, bilirubin  total 0.7, alk phos 70, SGOT 69, SGPT 26, total protein 5.6, albumin  2.9.  Calcium 8.9.  White blood cell count 14.6, hemoglobin 12.3,  hematocrit 37.3, platelet count 226.   Urinalysis is negative.  A chest x-  ray was completed and it reveals asymmetric haziness in the lingula and  left lower lobe suspicious for evolving infiltrate.   ASSESSMENT/PLAN:  1. Pneumonia.  Will start the patient on empiric IV antibiotics      consisting of Azithromycin and Rocephin for now.  We will check      blood cultures x2.  2. Acute onset weakness/ataxia.  The etiology of this is questionable.      We will check a CT scan of the patient's head to rule out an acute      event as a contributing factor.  We will also consult physical      therapy.  3. Leukocytosis.  This is most likely related to the pneumonia.  Will      start the patient on empiric IV antibiotics.  4. Fevers.  Pneumonia is the most likely precipitating event.  We will      check blood cultures and start the patient on empiric antibiotics.  5. Hyponatremia.  We will give a trial of normal saline for now.  May      consider additional workup after the patient is admitted into the      hospital.  6. Hypoalbumin.  Whether or not this is related to protein calorie      malnutrition is questionable.  We will check a      prealbumin.  May also consider nutrition consult.  7. Gastrointestinal prophylaxis.  We will provide Protonix.  8. Deep vein prophylaxis.  We will provide Lovenox.      Michaelyn Barter, M.D.  Electronically Signed     OR/MEDQ  D:  05/08/2008  T:  05/08/2008  Job:  [id number]   cc:   Melvern Banker  Fax: [phone]   Dr. Emeline Darling
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
## 2011-04-09 NOTE — Assessment & Plan Note (Signed)
[name] is a 65 year old woman who is followed in our [facility] for multiple chronic pain complaints.  This is the  third time I have seeing her in this clinic.  She was initially seen on  Apr 17, 2009.   She is back in today with multiple complaints including right shoulder  pain, bilateral hand pain, right knee pain, bilateral foot pain.  At the  last visit, she was noted to have a positive ANA and positive rheumatoid  factor.  A call was made to her primary care physician and they  indicated they would try to get her set up with a rheumatologist and  this is currently in the process.   Her average pain is about an 9 on a scale of 10.  She is specifically  complaining of burning feet today.  Sleep tends to be poor.  Pain is  worse with standing a lot.  She gets fair relief with current meds.   Medications prescribed by this clinic include Percocet 10/325 up to 3  times a day.   She denies problems controlling bladder, occasional bowel problems,  occasional weakness, numbness, depression, and anxiety.  Denies suicidal  ideation.  She also complains of nausea, constipation, abdominal pain,  poor appetite, changes in blood sugar, limb swelling, I asked her to  follow back up with her primary care doctor for all of these problems.   Past medical, social, and family history unchanged since our last visit.   Exam today, blood pressure is 101/62, pulse 75, respirations 18, 98%  saturated on room air.  She is an obese female who does not appear in  any distress.  She is oriented x3.  Speech is clear.  Affect is bright.  She is alert, cooperative, and pleasant.  Follows commands without  difficulty.  Answers my questions appropriately.   Cranial nerves are notable for slightly decreased right nasolabial fold  which she states it has been this way for 15 years.  She believes she  may have had a stroke about 15 years ago, had some right upper and right  lower  extremity weakness.  Coordination is intact and through.  Reflexes  are symmetric in the upper extremities and diminished in the lower  extremities.  She has good strength in upper and lower extremities, give-  way weakness noted distally in the lower extremities.   Sensation she reports diminished sensation through the entire right  upper extremity and entire right lower extremity.  She also reports  stocking-glove type diminished sensation in both lower extremities as  well.   Transitioning from sitting to standing is done without difficulty.  Gait  is stable.  Tandem gait and Romberg test are performed adequately.  Mild  limitations in cervical and lumbar range of motion and significant  limitations in right shoulder range of motion 120 with abduction on the  right and 130 with abduction on the left.   Evaluation of her joints, she has tenderness with palpation around the  right elbow, especially the lateral epicondyle.  She has some swelling  over the second and third metacarpophalangeal joint.  She has some  complaints of some ankle tenderness and swelling as well.   IMPRESSION:  1. Status post joint swelling plus history of positive rheumatoid      factor and ANA, awaiting rheumatologic referral.  2. Bilateral lower extremity numbness and tingling with history of      diabetes consistent with diabetic peripheral neuropathic  type pain.  3. Chronic low back pain most likely related to facet arthropathy.  4. Right shoulder pain, history of rotator cuff problems, status post      surgery.  5. Status post significant left groin debridement 5 years ago with      significant reduction and soft tissue in this area.   PLAN:  We will trial her on Neurontin today, titrating up slowly  starting with 300 mg at night, titrating up to 4 times a day.  We will  also refill her Percocet 10/325 not more than 3 times per day.  We will  see her back in 2 months' and nursing visit next month for  refill of her  medications.           ______________________________  Brantley Stage, M.D.     DMK/MedQ  D:  06/21/2009 13:23:39  T:  06/22/2009 03:41:47  Job #:  [id number]

## 2011-04-12 NOTE — H&P (Signed)
[facility]  Patient:    Yvonne [name], Yvonne [name]                         MRN: [id number] Adm. Date:  [id number] Disc. Date: [id number] Attending:  Feliciana Rossetti                         History and Physical  CHIEF COMPLAINT:  Shortness of breath.  HISTORY OF PRESENT ILLNESS:  [age]-year-old white female, known poorly controlled insulin-dependent diabetic, had acute-onset shortness of breath this day of admission.  Patient endorsed fevers, chills, nausea.  No vomiting, no diarrhea, no headache.  Patient endorsed severe shortness of breath and family reports patient to be acrocyanotic intermittently.  Patient describes a severe cough and denies focal numbness, paresthesias or weakness.  Patient denies blindness or double vision.  Patient denies rash or skin breakdown or back pain or ankle edema or hemoptysis.  PAST MEDICAL HISTORY 1. Insulin-dependent diabetes mellitus, poorly controlled. 2. Heavy tobacco use. 3. Chronic obstructive pulmonary disease. 4. Exogenous obesity. 5. Asthmatic bronchitis. 6. Osteoarthritis.  PAST SURGICAL HISTORY 1. Hysterectomy in 1972. 2. Right inguinal neoplasm resection in remote past.  MEDICINES 1. Insulin 70/30, 59 units q.d. 2. Aspirin 81 mg p.o. q.d. 3. Multivitamin one p.o. q.d. 4. Sonata 20 mg p.o. q.h.s. 5. Valium 10 mg p.o. q.i.d. 6. Paxil 20 mg p.o. q.d. 7. Aciphex 20 mg p.o. q.d.  ALLERGIES:  VICODIN.  SOCIAL HISTORY:  Heavy tobacco use.  No alcohol.  No illicits.  Patient is recently widowed.  FAMILY HISTORY:  No collagen vascular disease.  No early myocardial infarction or cerebrovascular accident.  REVIEW OF SYSTEMS:  No chest pressure.  No jaw pain or arm pain.  See HPI.  PHYSICAL EXAMINATION  GENERAL:  Nontoxic, in moderate respiratory distress.  HEENT:  Pupils are equal, round and reactive to light.  Extraocular movements are intact.  Fundi without exudate or hemorrhage.  Normal disk edges.   Mucous membranes tacky.  NECK:  Supple.  Lymph node survey negative.  No carotid bruits.  Oropharynx without lesions.  CARDIAC:  Regular rhythm, rate of 110.  No murmur, rub or gallop.  LUNGS:  Poor symmetric air movement.  Bilateral expiratory wheeze. Inspiratory-to-expiratory ratio diminished.  Bibasilar crackles.  ABDOMEN:  Soft.  No hepatosplenomegaly.  Obese.  No masses.  No rebound.  No guarding.  Normal bowel sounds.  EXTREMITIES:  No clubbing, cyanosis, or edema.  No cords or Homans.  Muscle strength 5/5 and symmetric. DTRs 1 and symmetric.  NEUROLOGIC:  Cranial nerves II-XII grossly within normal limits.  Rapid alternating movements within normal limits.  RECTAL:  Rectal tone normal with no blood in vault.  PELVIC:  Exam deferred to outpatient setting.  BREASTS:  Exam deferred to outpatient setting.  DATA:  Chest x-ray reveals opacity in the right middle lobe, chronic bronchitic changes.  Leukocytosis of 16,000, elevated hemoglobin at 15.1, elevated absolute neutrophil count of 9800.  Sodium depressed at 133, glucose at 235.  ASSESSMENT AND PLAN 1. Pneumonia:  Tequin 400 mg intravenously q.d. 2. Asthma exacerbation:  Depo-Medrol 160 mg intramuscularly, prednisone 60 mg    p.o. q.d., albuterol and ipratropium nebulized therapy. 3. Insomnia:  Sonata. 4. Insulin-dependent diabetes mellitus:  Scheduled insulin 70/30 and    sliding-scale insulin to be used. DD:  12/25/00 TD:  12/26/00 Job: [id number]

## 2011-04-12 NOTE — Consult Note (Signed)
[facility]  Patient:    OZELL, FERRERA                         MRN: [id number] Adm. Date:  [id number] Attending:  Feliciana Rossetti CCLeroy Sea., M.D.             Feliciana Rossetti, M.D.                          Consultation Report  REQUESTING PHYSICIAN:  Leroy Sea., M.D.  PRIMARY PHYSICIAN:  Feliciana Rossetti, M.D.  HISTORY:  This is a 65 year old white female, long-term smoker, with morbid obesity and chronic asthmatic bronchitis.  She states on a good day that as long as she takes her time, she can walk on flat surfaces in and out of grocery stores and even up a flight of steps and do light housework; however, over the last week, she has had worsening dyspnea in the setting of a congested cough with minimal thick mucoid sputum production and worsening sinus congestion as well.  She denies any pleuritic chest pain or fever but was admitted on Apr 20, 2000 with this history, with desaturation on room air. She has been treated with Maxipime and Zithromax since admission, with a chest x-ray suggesting severe bronchitis, and I was asked to see her today because her PO2 was only 30 on room air by blood gas.  This seemed disproportunate to her physical appearance, which appears to be in no distress.  Presently, the patient denies any headache or difficulty with breathing.  She was not even wearing her oxygen when I arrived in the room.  She did indeed have a congested cough, which was not particularly effective; she did not, however, appear to be in any distress and was mentating normally.  She denies any history of morning headache.  She states the only time she is drowsy is after she takes one of her "nerve pills" (Valium).  She denies otherwise any excessive hypersomnolence.  PAST MEDICAL HISTORY:  Significant for obesity complicated by diabetes, and hiatal hernia with esophagitis.  She also had osteoarthritis and is on disability  for this.  ALLERGIES:  VICODIN causes hallucinations.  Specifically, she denied any aspirin intolerance.  MEDICATIONS:  I am not sure what she takes as an outpatient, as she did not list this for Dr. Scotty Court.  In the hospital, she has been on Maxipime and Zithromax empirically since the 26th, Singulair, guaifenesin, Advair 250/50 one b.i.d., insulin, Effexor, Avandia.  SOCIAL HISTORY:  She states she quit smoking at the time of her admission. She denies any alcohol history.  She has recently become a widow.  FAMILY HISTORY:  Negative for respiratory diseases.  REVIEW OF SYSTEMS:  Review of systems were taken in detail and reviewed with the patient and from the chart as well.  She denies any choking on food or overt reflux symptoms at present but does complain of sinus congestion.  PHYSICAL EXAMINATION:  GENERAL:  This is an obese white female who appeared approximately her stated age.  VITAL SIGNS:  She was afebrile with stable vital signs.  Her saturation on room air present is 96%.  Her last saturation on 4 L/min is 94%.  HEENT:  Ocular exam is grossly benign.  Oropharynx is clear with no evidence of an excessive postnasal drainage.  Nasal turbinates appear to be  edematous.  NECK:  Supple without cervical adenopathy or tenderness.  Trachea is midline.  LUNGS:  Lung field reveal diminished breath sounds with minimal rhonchi on exam; she did have, however, a congested cough.  There was increase in expiratory time but no audible wheezing.  CARDIAC:  There was regular rate and rhythm with no murmur, gallop or rub present.  There was no increase pulmonic ______ .  ABDOMEN:  Obese ______, benign.  No palpable organomegaly or masses.  EXTREMITIES:  Warm without calf tenderness, clubbing, cyanosis, or edema.  SKIN:  Warm and dry.  NEUROLOGIC:  Alert and appropriate with no deficits.  LABORATORY AND X-RAY FINDINGS:  Chest x-ray was reviewed from today and revealed question  of hilar or mediastinal adenopathy, although this is subtle. There did appear to be increased bronchial markings and mild cardiomegaly but no definite vascularization.  Lab data included a blood gas on room air revealing a PO2 of 40; however, repeat blood gas on 4 L revealed pH of 7.42 with a PCO2 of 42 and a PO2 of 73.9.  WBC was 14,000 on admission without any significant eosinophilia.  IMPRESSION: 1. Acute hypoxemic respiratory failure appears better at present (saturation    96% on room air), though one could certainly understand how she could    become hypoxemic, based on her body habitus and the fact that she is    spending most of her time in bed (basilar atelectasis could easily    contribute to desaturation).  The other possibility is that we simply had a    venous blood gas.  What makes this a bit less likely is that on 4 L/min,    she only had a PO2 of 74.  I am therefore going to ask that her oxygen be    stopped and that she be checked on room air to see if she indeed qualifies    for home oxygen.  In the meantime, I certainly agree with everything that    has been done to treat her airways disease. 2. History of reflux.  I have recommended Protonix 40 mg q.d. empirically    for at least as long as she is having respiratory symptoms. 3. History of rhinitis with acute congestion for the last week.  I would    recommend a sinus CT scan at this point to be complete (that is, for future    reference). 4. History of smoking, status post smoking "cessation" at admission.  I    strongly doubt that she has been able to make the leep to a "non-smoking    status," given this one admission, but she certainly plans to try and she    should get all the help she needs in the form of nicotine replacement    patches and Wellbutrin. 5. Morbid obesity probably will be her greatest long-term pulmonary problem.    I am going to recommend a thyroid-stimulating hormone be drawn and also    that  dietary see her regarding a weight loss plan.  6. For now, I am also going to recommend incentive spirometry and I did not    make any changes on any of her pulmonary medications, which appear entirely    appropriate to me.  I would be happy to see this patient again as an outpatient if requested. DD:  04/21/00 TD:  04/23/00 Job: [id number]

## 2011-04-12 NOTE — Discharge Summary (Signed)
[facility]  Patient:    Yvonne [name], Yvonne [name]                         MRN: [id number] Adm. Date:  [id number] Disc. Date: [id number] Attending:  Feliciana Rossetti                           Discharge Summary  DISCHARGE DIAGNOSES:  Pneumonia, pyelonephritis, hyperpotassemia, chronic obstructive pulmonary disease, non-insulin-dependent diabetes mellitus.  REASON FOR HOSPITALIZATION:  Pneumonia.  HOSPITAL COURSE:  The patient was admitted to a general medical bed and treated empirically with an 1800 ADA diet, oxygen, Depo-Medrol 160 mg IM, prednisone 60 p.o. q.d., albuterol and ipratropium nebulizers, and potassium intravenously. Her insulin was continued. Her empiric antibiotics initially were Tequin 400 IV q.d. The patient worsened regarding her oxygen requirement and her leukocytosis and antibiotics were advanced to Unasyn 3 mg IV q. 6h. Under this management path, the patient rapidly improved. For refractory agitated depression, Zyprexa and Prozac were begun. This led to much improvement in the patients agitation.  Laboratory values of note include a leukocytosis of 16,000 on the day of admission that increased to 25,000 by December 4. Absolute neutrophil count was 21,400 on December 4. Both these measures were trending down by the day of discharge. Glucose peak was 363 on December 4, this was also trending down by discharge time. The patient had a borderline low potassium of 3.5 on December 3 and was over treated to 5.2 on December 4 and then this subsequently normalized with expected management. Curiously the patient developed a pyelonephritis during her hospital stay with white blood cell count and pyuria and flank tenderness and flank pain. This has well responded to her Unasyn antibiotics.  Chest x-ray showed a likely infiltrate in the right middle lobe. The patient rapidly responded to the Unasyn and by December 6 was felt ready for discharge.  CONDITION  ON DISCHARGE:  The patient was well appearing, ambulating with ease without oxygen with no shortness of breath. Her discharge vital signs were 97.5, 122/80, 94 and 20.  DISCHARGE MEDICATIONS:  Augmentin 875 b.i.d. 10 days, Prozac 20 q.d., Zyprexa 2.5 q.d. and Valium 2.5 mg p.o. q.i.d.  FOLLOW-UP:  Dr. Quintella Reichert 2 weeks following discharge and nutrition and diabetes management. Appointment was made for November 10, 2000. DD:  11/15/00 TD:  11/16/00 Job: [id number]

## 2011-04-12 NOTE — Discharge Summary (Signed)
Yvonne [name], Yvonne [name]                ACCOUNT NO.:  [id number]   MEDICAL RECORD NO.:  [id number]          PATIENT TYPE:  INP   LOCATION:  3308                         FACILITY:  [facility]   PHYSICIAN:  Hillery Aldo, M.D.   DATE OF BIRTH:  10/10/1946   DATE OF ADMISSION:  08/17/2004  DATE OF DISCHARGE:  08/24/2004                                 DISCHARGE SUMMARY   DISCHARGE DIAGNOSES:  1.  Necrotizing fascitis of the right labia due to bacterium diphtheroid,      status post incision and drainage on August 18, 2004, by Dr. Carey Bullocks      and Dr. Daphine Deutscher.  2.  Insulin-dependent diabetes mellitus with hemoglobin A1C of 12.5.  3.  Chronic obstructive pulmonary disease.  4.  Hypertension.  5.  Anxiety.  6.  Depression.  7.  Hypertriglyceridemia with triglycerides of 250.  8.  Hypokalemia.  9.  Acute renal failure most likely secondary to intravenous contrast.   PAST MEDICAL HISTORY:  1.  History of osteomyelitis with cellulitis of the right leg in the 1990s.  2.  Obesity.  3.  History of arthritis.  4.  History of pneumonia and pyelonephritis.  5.  Hysterectomy in 1972.  6.  Right inguinal neoplasm resection in the remote past.  7.  Tobacco abuse.  8.  GERD.  9.  Allergic rhinitis.   DISCHARGE MEDICATIONS:  1.  Prozac 40 mg p.o. q.d.  2.  Singulair 10 mg p.o. q.h.s.  3.  Xanax 0.5 mg p.o. q.i.d.  4.  Multivitamin one tablet q.d.  5.  Insulin 70/30 Novolin 70 units in the morning and 20 units at bedtime.  6.  Protonix 40 mg p.o. q.d.  7.  Morphine sulfate 2-4 mg q.4h. p.r.n. pain.  8.  Advair 250/50 one puff inhalation b.i.d.  9.  Spiriva 19 mcg one inhalation q.d.  10. Zosyn 3.375 g q.8h.  11. Clindamycin 300 mg IV q.8h.  12. Vancomycin 750 mg IV q.24h.  13. Phenergan 12.5/25 mg q.6h. p.r.n. pain.  14. Albuterol MDI one to two puffs q.4h. p.r.n. shortness of breath.   DISPOSITION:  The patient is being transferred to [facility] under Dr.  Ermalene Searing service per Dr.  Carey Bullocks because of worsening in the present  condition and for possible vulvectomy and the need for very long postop  course.   PROCEDURES:  1.  Incision and drainage of the left labial abscess because of necrotizing      fascitis done on August 18, 2004, by Dr. Carey Bullocks and Dr. Daphine Deutscher.  2.  Computed tomography of the abdomen and pelvis performed on August 18, 2004, showing negative computed tomography of the abdomen, but gas noted      in the soft tissues of the left mons and left labia consistent with gas      forming organism suggestive of necrotizing fascitis with no abscess      seen.   HISTORY OF PRESENT ILLNESS:  Yvonne [name] is a 65 year old, African-American  female with an extensive past medical history who came to the emergency  room  with history of 1 month of vulvar swelling and 2 days of severe vulvar pain.  She used a home remedy of fat back causing it to draw and then had someone  squeeze it and passed pus from the lesion.  She had some improvement in her  symptoms for several days and then noticed a painful swelling that got  bigger and bigger.  She saw some doctor in the office and was placed on  ciprofloxacin, but came to the emergency room feeling worse complaining of  shaking and chills, decreased appetite and generalized malaise.  According  to the patient, she had a similar episode about 12 years ago with a similar  abscess on the right and Dr. Lurene Shadow drained a right vulvar abscess at the  time.  That was the time that she was first diagnosed with diabetes.   ALLERGIES:  CODEINE gives her nausea.  ASPIRIN gives her GI upset.   SOCIAL HISTORY:  The patient is a current smoker and smokes 1/2 pack per day  x40 years.  No history of alcohol or IV drug use.  The patient is widowed.  She is disabled and has Medicare and Medicaid for insurance.   FAMILY HISTORY:  Mother passed away with ovarian cancer at age 73.  Father  passed away at 83 because of her heart  attack.  He also had some cancer  which the patient does not know.   PHYSICAL EXAMINATION:  VITAL SIGNS:  Pulse 110, blood pressure 130/72,  temperature 98.7, respirations 28, O2 saturations 89% on room air.  GENERAL:  In no acute distress.  HEENT:  PERRL.  Oropharynx clear.  NECK:  Supple.  No JVD, no bruits.  LUNGS:  Bilateral basilar crackles, good air movement with bilateral  wheezing.  Distant breath sounds.  CARDIAC:  Regular rate and rhythm.  No murmurs appreciated.  S1, S2 normal.  ABDOMEN:  Soft.  Bowel sounds present.  Obese.  Tenderness in lower abdomen  which is diffuse.  No rebound, guarding or rigidity.  EXTREMITIES:  Trace edema with no cyanosis.  Scar in the right upper thigh.  GENITALIA:  Vulvar exam with left vulva swollen 7 x 5 cm, slightly inflamed,  no discharge, very tender, no cervical lesions or discharge.  Left inguinal  lymphadenopathy.  MUSCULOSKELETAL:  5/5 in all extremities.  NEUROLOGIC:  Intact.   LABORATORY DATA AND X-RAY FINDINGS:  Sodium 124, potassium 3.9, chloride 91,  bicarb 21, BUN 8, creatinine 1.1, glucose 299.  Hemoglobin 10.7, white blood  cell count 25.7, platelets 483, ANC 24.4, MCV 76.1.  Bilirubin 0.5 with  direct bilirubin less than 0.1, Alk phos 110, SGOT 19, SGPT 11, protein 6,  albumin 2.4, calcium 8.6.  UA with ketones greater than 80, total protein  greater than 100, negative leukocyte esterase and nitrate, 3-6 wbc's, 0-2  rbc's and rare bacteria.   HOSPITAL COURSE:  Problem 1.  NECROTIZING FASCITIS OF THE LEFT LABIA:  Since  the patient was in such acute distress, a pelvic exam was performed with the  above findings.  A decision was made to obtain a gynecology consult.  Dr.  Carey Bullocks came and assessed the patient and recommended a CT of the pelvis to  rule out necrotizing fascitis.  CT of pelvis was positive for necrotizing  fascitis and the patient received incision and drainage for seizure on August 18, 2004, performed by Dr.  Carey Bullocks and assisted by Dr. Daphine Deutscher.  She was also started on IV  antibiotics mainly Zosyn, vancomycin and  clindamycin.  She received a total of 7 days of these three antibiotics  while in  [facility].  Blood cultures were obtained and were  negative.  Wound cultures were obtained and grew bacterium diphtheroids.  We  could not obtain and sensitivity results back for this organism from our lab  and infectious disease was consulted curbside and they recommended  continuing the vancomycin and Zosyn, but discontinuing the clindamycin.  However, since the patient has deteriorated, she will continue to get all  three antibiotics and further care will be managed at the transfer hospital.  On the day of transfer, Dr. Carey Bullocks assessed the patient and in view of the  clinical picture of pain increasing despite drain removal, white blood count  increasing and no relief by the patient, she suggested transferring the  patient.  She spoke to Dr. Suezanne Cheshire, a gynecologist at [facility], who has  agreed to accept the patient and the patient is being transferred to his  care at [facility].  Of note, the patient was on vancomycin, Zosyn and  clindamycin day #7 on August 24, 2004.   Problem 2.  DIABETES MELLITUS:  The patient has a history of insulin-  dependent diabetes mellitus.  Her last prescription for insulin was filled  in August.  A hemoglobin A1C was obtained and was 12.5 indicating poor  control.  The patient was initially put on sliding scale insulin and then  started on 70/30 regimen.  On day of discharge, her 70/30 regimen consists  of 70 units in the morning and 20 units in the evening along with sliding  scale insulin for tight glycemic control.   Problem 3.  CHRONIC OBSTRUCTIVE PULMONARY DISEASE:  The patient initially  received albuterol and Atrovent nebulizers.  This was later switched to  Advair, Spiriva and albuterol p.r.n.  The patient did have any exacerbations  of  COPD in the hospital.  She did have occasional wheezing on daily physical  exam.   Problem 4.  HYPERTENSION:  Blood pressure was stable.  The patient was  started on Altace 2.5 mg b.i.d.  However, there was a greater than 20%  increase in serum creatinine after starting Altace and thus it was  discontinued.  The patient's blood pressure was stable during  hospitalization stay and did not require any blood pressure medications.   Problem 5.  HYPERTRIGLYCERIDEMIA:  The patient has a history of  hyperlipidemia.  A fasting lipid panel was obtained and was as follows:  Cholesterol 151, triglycerides 250, HDL 17, LDL 84.  The elevated  triglycerides can be maintained on dietary therapy before starting medical  therapy.   Problem 6.  ANEMIA:  The patient's hemoglobin on admission was 9.9.  However, hemoglobin continued to drop during hospitalization stay most  likely secondary to the procedure as well dilution and dropped to 7.9.  The patient had a lot of symptoms of fatigue and dizziness, and thus was  transfused with 2 units of PRBCs on August 22, 2004.  Hemoglobin at  transfer is 9.9.   Problem 7.  ILEUS:  The patient had a functional ileus the day after  incision and drainage with absent bowel sounds.  She was made NPO and soon  started on clear liquids the next day with passing of gas and stools.  The  ileus subsequently resolved.  The patient did not have any further episodes  and has been able to tolerate oral feeds.  Problem 8.  ACUTE RENAL FAILURE:  Creatinine on admission was 1.1.  Creatinine on day #2 of admission rose from 0.9 to 1.5.  This was thought to  be most likely secondary to the IV contrast which was further precipitated  by the ACE inhibitor.  The patient continued to have good urine output  during the entire hospitalization stay.  Her creatinine continued to drop  with creatinine of 1.4 on the day of transfer.   Problem 9.  LEUKOCYTOSIS:  The patient had an  elevated white count with a  left shift on day of admission.  During initial stay in the hospital, the  white count started dropping.  However, it started increasing 2 days prior  to transfer.  This was a little concerning especially since the patient had  received the I&D and was on antibiotics.  The patient did not have any  temperature spikes to go with the leukocytosis.  However, on the day of  transfer the patient's clinical condition worsened and thus Dr. Carey Bullocks  recommended transferring the patient for possible vulvectomy.  White count  on day of transfer was 21.1.   Problem 10.  DEPRESSION/ANXIETY:  These were maintained on Prozac and Xanax  respectively.   DISCHARGE LABORATORY DATA AND X-RAY FINDINGS:  Hemoglobin 9.9, white blood  count 21.1, platelets 560.  Sodium 134, potassium 4, chloride 104, bicarb  23, BUN 11, creatinine 1.4, glucose 70.  TSH 0.726.  Hemoglobin A1C 12.5.  Triglycerides 250.   DISCHARGE PHYSICAL EXAMINATION:  VITAL SIGNS:  Temperature 97.4, pulse 75,  blood pressure 129/65, respirations 18, saturations 91% on room air.       CR/MEDQ  D:  08/24/2004  T:  08/24/2004  Job:  [id number]

## 2011-04-12 NOTE — Op Note (Signed)
Yvonne [name], KLETT NO.:  [id number]   MEDICAL RECORD NO.:  [id number]          PATIENT TYPE:  INP   LOCATION:  2105                         FACILITY:  [facility]   PHYSICIAN:  Pershing Cox, M.D.DATE OF BIRTH:  22-Jun-1946   DATE OF PROCEDURE:  08/18/2004  DATE OF DISCHARGE:                                 OPERATIVE REPORT   PREOPERATIVE DIAGNOSIS:  Necrotizing fasciitis involving left labia and  mons.   POSTOPERATIVE DIAGNOSIS:  Same.   PROCEDURE PERFORMED:  Incision and drainage of left labial abscess.   SURGEON:  Pershing Cox, M.D.   ASSISTANT:  Thornton Park. Daphine Deutscher, M.D.   ANESTHESIA:  General endotracheal.   INDICATIONS FOR PROCEDURE:  This patient was admitted through the emergency  room last night, afebrile but with a swollen vulva and mons, which were  woody in texture.  There was no localizing infection noted.  The patient  spiked a temperature overnight.  Her white count was 24,000 on admission.  CAT scan was performed, which showed evidence of gas-producing organism, and  for this reason, the patient was counseled for surgery and brought to the  operating room today.   OPERATIVE FINDINGS:  There was very little pus accumulation, but deep to the  patient's fat lying along the fascia was a small amount of purulent fluid  with a very marked anaerobic smell.  There was no evidence of necrotic  fascia.   DESCRIPTION OF PROCEDURE:  The patient was brought to the operating room  with an IV in place.  Supine on the OR table, general endotracheal  anesthesia was administered, and she was placed in the [place] stirrups.  Betadine was used to prep the lower abdomen, perineum and vagina.  A Foley  catheter was sterilely inserted into the bladder.  Before prepping, a  marking pen had been used to outline the area of induration.   A transverse incision was made in the mons just below the pannicular crease.  This was carried down to the fascia, and the  plane of dissection was easily  created down into the patient's labia majora.  With this cavity isolated,  stab incisions were made in the lateral wall of the left labia.  Irrigation  was then passed through the stab incisions and the upper incision to  irrigate the entire cavity after cultures had been collected, both upper and  below.  Both aerobic and anaerobic cultures were collected.   A Blake drain was introduced from the top to the bottom to create suction,  and then a second Blake drain was placed from top to bottom to irrigate.  This Blake drain was sewn in place with Ethilon.  There were no additional  sutures  placed.  The stab incisions were made hemostatic with cautery.  Gauze was  used to fluff the perineal incision.  The patient was taken to the recovery  room in excellent condition.  While in the operating room, both an A-line  and a central line were placed by anesthesia.       MAJ/MEDQ  D:  08/18/2004  T:  08/19/2004  Job:  [id number]   cc:   Hillery Aldo, M.D.  Int. Med. - Resident - [address]  [place], [place] [address]  Fax: [phone]   Thornton Park. Daphine Deutscher, M.D.  [address], [address]  [place]  [place] [address]  Fax: [phone]

## 2011-04-12 NOTE — Discharge Summary (Signed)
Yvonne [name], Yvonne [name]                ACCOUNT NO.:  [id number]   MEDICAL RECORD NO.:  [id number]          PATIENT TYPE:  INP   LOCATION:  1501                         FACILITY:  [facility]   PHYSICIAN:  Wilson Singer, M.D.DATE OF BIRTH:  [birth year]   DATE OF ADMISSION:  05/08/2008  DATE OF DISCHARGE:  05/14/2008                               DISCHARGE SUMMARY   FINAL DISCHARGE DIAGNOSES:  1. Left lower lobe pneumonia, clinically improved.  2. Anemia of chronic disease.  3. Morbid obesity.  4. Delirium, resolved.  5. Type 2 diabetes mellitus.  6. Chronic obstructive pulmonary disease, stable.   CONDITION ON DISCHARGE:  Stable.   MEDICATIONS ON DISCHARGE:  1. Alprazolam 1 mg t.i.d.  2. Citalopram 20 mg daily.  3. Zantac 150 mg b.i.d.  4. Singular 10 mg daily.  5. Ambien 10 mg nightly p.r.n.  6. Lantus insulin 15 units daily.  7. Avelox 4 mg daily for 10 further days.   HISTORY:  This 65 year old lady was admitted with chills, sweats and  symptoms suggestive of delirium.  Please see initial history and  physical examination done by Dr. Michaelyn Barter.  Please also see  discharge summary dictated by Dr. Hillery Aldo.  In fact, the  discharge summary that I am dictating is largely superfluous and is  unchanged from the dictation done by Dr. Hillery Aldo.      Wilson Singer, M.D.  Electronically Signed     NCG/MEDQ  D:  06/02/2008  T:  06/02/2008  Job:  [id number]

## 2011-04-12 NOTE — H&P (Signed)
[facility]  Patient:    Yvonne [name], Yvonne [name]                         MRN: [id number] Adm. Date:  [id number] Attending:  Franciso Bend                         History and Physical  HISTORY OF PRESENT ILLNESS:  This is a readmission for this 65 year old [place] woman who was admitted by way of the emergency room with the complaint of shortness of breath and coughing.  She relates a history of a respiratory infection over he past week, with no physicians treatment, but over-the-counter therapy, and it had increased in severity over the past two to three days.  She decided to come to he emergency room this evening for treatment.  She was seen in the emergency room y the emergency room physician.  She relates no previous history of asthma or this type of respiratory infection.  She is a patient of Dr. Ova Freshwater.  She is a known diabetic, insulin-dependent, on Novolin insulin 70/30, 55 units q.d.  She has no hypertension or cardiovascular disease.  The infectious stage of this problem began with a cough, congestion, some nasal congestion, no remarkable elevation f her temperature.  With this increased cough and congestion, she came to the emergency room as stated above.  She was seen in the emergency room and had a temperature of 99.5 degrees, pulse  104, blood pressure 123/85.  A CBC in the emergency room was 14.5, PO2 of 75.  chest x-ray was compatible with bronchitis.  An electrocardiogram was normal.  physical examination as stated in the emergency room.  PAST MEDICAL HISTORY:  Usual childhood diseases without sequela.  No serious illnesses.  REVIEW OF SYSTEMS:  GASTROINTESTINAL:  The digestive review of systems reveals  history of esophagitis, hiatal hernia with reflux, and has been on the appropriate medicine for this problem.  She also states she has a problem with constipation. RESPIRATORY:  See the present illness.  There  has been no hemoptysis, no past history of this type of problem.  URINARY:  No dysuria, hematuria, nocturia. No history of renal disease.  MENSTRUAL HISTORY:  The patient had a hysterectomy. She had no vaginal bleeding.  No discharge.  NEUROLOGICAL:  Negative review.  SOCIAL HISTORY:  Smokes one to two packs of cigarettes per day.  No alcohol.  FAMILY HISTORY:  Mother deceased at age 67, ovarian cancer.  Father deceased at age 53, congestive heart disease.  Husband died on Feb 29, 2000, with congestive heart disease.  Has had eight brothers.  The oldest brother is living and well.  Other brothers are deceased with heart disease.  Other problems the patient is ot aware; however, she states that there was no cancer.  She has one living older sister who is living and well.  She has five sisters deceased.  One had sarcoma. The others with other metabolic diseases.  PAST SURGICAL HISTORY:  The patient had a hysterectomy in 1972.  The removal of  some type of lesion over the right inguinal area and thigh.  PHYSICAL EXAMINATION:  VITAL SIGNS:  On admission temperature 99.3 degrees, blood pressure 130/80 on the floor, pulse regular at 100, respirations 22.  O2 saturation 97.  CBG on admission 241.  Weight 233 pounds.  HEENT:  No exostosis.  No tenderness.  Ears clear.  Nose:  Some nasal congestion. Mouth, gums, teeth, tongue unremarkable.  NECK:  Supple, trachea midline.  Thyroid is nonpalpable.  Carotid pulses good.  CHEST:  Rhonchi and rales bilaterally, especially at the bases.  Expiratory wheezes over both bilateral chest.  HEART:  No evidence of cardiomegaly.  Heart sounds good without murmurs. Peripheral pulsations good and equal bilaterally.  BREASTS:  Large, pendulous.  No masses palpable.  A mammogram one year ago.  ABDOMEN:  Liver, kidneys, spleen nonpalpable.  No tenderness.  No masses.  PELVIC:  Not done at this time.  EXTREMITIES:  No peripheral edema.   Otherwise negative.  ADMISSION DIAGNOSES: 1. Asthmatic bronchitis. 2. Insulin-dependent diabetes mellitus. 3. Exogenous obesity. 4. Osteoarthritis.  PLAN ON ADMISSION:  Cultures, appropriate antibiotics, other studies.  Evaluate and control the diabetes mellitus. DD:  04/20/00 TD:  04/20/00 Job: [id number]

## 2011-04-12 NOTE — Consult Note (Signed)
NAMEIRINE, HEMINGER NO.:  [id number]   MEDICAL RECORD NO.:  [id number]          PATIENT TYPE:  INP   LOCATION:  2105                         FACILITY:  [facility]   PHYSICIAN:  Pershing Cox, M.D.DATE OF BIRTH:  10-Feb-1946   DATE OF CONSULTATION:  DATE OF DISCHARGE:                                   CONSULTATION   Consultation requested by the teaching service.   PROBLEM:  Vulvar abscess, and elevated white blood cell count.   HISTORY OF PRESENT ILLNESS:  The patient is a 65 year old gravida 4 para 3-0-  1-3 widowed Native American who developed a vulvar abscess about two weeks  ago.  She used a home remedy of fatback, causing it to draw, and then had  someone squeeze it, and has passed pus from the lesion.  She had improvement  in her symptoms for several days and then noted a painful swelling that got  bigger and bigger.  She saw Dr. __________ in the office this week, and she  placed her on ciprofloxacin.  She came to the emergency room tonight feeling  worse, complaining of shaking and chills, decreased appetite, and  generalized malaise.  After her evaluation, consultation was requested  because of this vulvar swelling.  According to the patient, she had a  similar episode about 12 years ago with a similar abscess on the right, and  Dr. Lurene Shadow drained a right vulvar abscess at this time.  That was the time  when she was first diagnosed with diabetes.   ALLERGIES:  CODEINE.   MEDICATIONS:  1.  Novolin 70/30, 65 units in the morning, 15 at night, with supplements as      needed by finger stick.  2.  Prozac 40 mg daily.  3.  Albuterol inhaler.  4.  Vicodin p.r.n. pain.  5.  Ciprofloxacin 500 mg b.i.d.  6.  Singulair.  7.  Altace 2.5 b.i.d.  8.  Protonix 40 mg daily.  9.  Zyrtec 10 mg daily.   SERIOUS MEDICAL ILLNESSES:  1.  Adult onset diabetes, insulin requiring.  2.  Hypertension.  3.  Depression.  4.  Asthmatic bronchitis.  5.  Smoking  history.  6.  GERD.  7.  Arthritis.  8.  History of progressive right knee pain.   SOCIAL HISTORY:  The patient is disabled.  She lives on [address]  Occasionally, she lives with her daughter who is 32 years of age.  She has  several children in town.  Her husband has been dead for five years.  She  does not work.   FAMILY HISTORY:  Mother died at 7 of ovarian cancer.  Father died at 29 of  congestive heart failure and cancer.  She has 19 siblings.  Many have heart  disease, but she cannot remember any more details.   PHYSICAL EXAMINATION:  GENERAL:  The patient is an obese Native American  lying supine on the exam table.  She is able to move up and down and  cooperate and answer questions throughout the examination.  Her only pain  seems to  be associated with the vulvar lesion.  LUNGS:  No wheezes heard.  CARDIOVASCULAR:  Tachycardia.  BREASTS:  No palpable masses.  ABDOMEN:  Normal bowel sounds.  Midline incision, without hernia.  LYMPH:  No supraclavicular, axillary, or inguinal adenopathy.  PELVIC:  There is brawny edema of the left mons extending down the left  labia to its base.  There is a lesion at the base of the left labia which is  a healed site of previous drainage.  There is no erythema in any of this  area.  There is no discrete abscess palpated.  There is no Bartholin's  abscess on either side of the vulva.  The remainder of the vulva is normal.  A speculum was inserted, and a wet prep was collected, but there was no  discrete abnormality seen.  The patient is status post hysterectomy.   LABORATORY DATA:  White count is 25.7.  No differential available.  Her  glucose is 356.   ASSESSMENT:  Vulvar infection of left labia and left mons, associated with  an earlier drainage procedure which was home remedy.  There is no localized  area to drain at this time.  Because of her diabetes, the risk of  necrotizing fasciitis is significant, and the patient needs to  be treated  aggressively and followed very closely for this potential.   RECOMMENDATIONS:  1.  Admit for IV antibiotics per ID, to be managed by the teaching service.  2.  Control the patient's diabetes, and address her issues of asthmatic      bronchitis.  3.  If there is no improvement, the patient should have a CT scan looking      for signs of fasciitis.  4.  Warm compresses to the labia and mons.   I will follow this patient with you and see her on a daily basis until we  know that she is improving.       MAJ/MEDQ  D:  08/17/2004  T:  08/19/2004  Job:  [id number]

## 2011-04-12 NOTE — H&P (Signed)
[facility]  Patient:    Yvonne [name], Yvonne [name] Visit Number: [id number] MRN: [id number]          Service Type: MED Location: 1W 0159 01 Attending Physician:  Lyn Records. Iii Dictated by:   Darci Needle, M.D. Admit Date:  04/26/2002   CC:         [facility], Dr. Emilee Hero, M.D.   History and Physical  REASON FOR ADMISSION:  Chest pain.  HISTORY OF PRESENT ILLNESS:  The patient is 65 and has a long-standing history of diabetes, approximately 13 years.  She has had difficulty maintaining her health because of economic issues.  She came to the [facility] Emergency Room because of the onset of chest discomfort.  She came to the ER at 11 p.m.  Her discomfort started at 11 a.m.  She states that for two months she has been having intermittent arm aching and discomfort.  The discomfort has intensified with moving the arm, such as trying to raise it over her head.  At 11 a.m. she began experiencing discomfort in the chest that she describes as a heaviness and an aching type of discomfort.  When it had persisted for 12 hours, she came to the ER.  In the ER she was evaluated.  No definite objective abnormalities were noted.  Because of her diabetes, it was felt that she needed to be admitted and have evaluation for coronary artery disease.  She is currently on IV nitroglycerin.  She continues to have left arm discomfort. There are no acute EKG changes.  ALLERGIES:  CODEINE causes nausea.  ASPIRIN causes stomach upset.  PAST MEDICAL HISTORY: 1. Diabetes. 2. Hypertension. 3. Obesity. 4. History of possible osteomyelitis with cellulitis of right leg in 1990s. 5. History of arthritis. 6. Depression.  FAMILY HISTORY:  Negative for CAD.  Positive for diabetes.  HABITS:  Smokes cigarettes.  Denies ethanol consumption.  REVIEW OF SYSTEMS:  Denies DVT, pulmonary emboli, dyspnea.  She does complain of being numb on the right side of her  body including the bottoms of both feet.  PHYSICAL EXAMINATION:  GENERAL:  The patient is obese.  She is in no acute distress.  VITAL SIGNS:  Blood pressure 100/60, heart rate 100, respirations 16 and unlabored.  SKIN:  Clear.  No cyanosis.  HEENT:  No xanthelasma.  Extraocular movements are full.  NECK:  Thyroid is not palpable.  No carotid bruits.  LUNGS:  Rales at both bases.  ABDOMEN:  Soft.  Liver edge is not palpable.  Bowel sounds are normal.  EXTREMITIES:  No edema.  Pulses are 2+ and symmetric in upper and lower extremities.  NEUROLOGIC:  Unremarkable.  LABORATORY DATA:  EKG demonstrates left axis, low voltage, nonspecific ST abnormality, sinus tachycardia.  Potassium 2.7, creatinine 28, BUN 9.0.  CK-MB normal.  White blood cell count 15,600.  Chest x-ray does not reveal failure.  Cardiac size is upper normal.  ASSESSMENT: 1. Prolonged arm and chest discomfort with nonischemic electrocardiogram and    negative enzymes in this 65 year old diabetic.  Rule out coronary artery    disease.  Rule out gastroesophageal reflux.  Rule out musculoskeletal. 2. Obesity. 3. Diabetes. 4. Hyponatremia and hypokalemia.  PLAN: 1. Serial enzymes and EKGs. 2. IV nitroglycerin. 3. Lovenox. 4. A 2-D echocardiogram. 5. Replete potassium. 6. Cardiolite April 28, 2002, at [facility] to rule out ______. Dictated by:   Darci Needle, M.D. Attending Physician:  Katrinka Blazing,  Barry Dienes. Iii DD:  04/27/02 TD:  04/27/02 Job: [id number]

## 2011-04-12 NOTE — Discharge Summary (Signed)
[facility]  Patient:    Yvonne [name], Yvonne [name]                         MRN: [id number] Adm. Date:  [id number] Disc. Date: [id number] Attending:  Feliciana Rossetti                           Discharge Summary  DISCHARGE DIAGNOSES: 1. Chronic obstructive pulmonary disease exacerbation, 491.20. 2. Respiratory failure. 3. Tobacco abuse. 4. Diabetes type 2. 5. Osteoarthritis. 6. Esophageal reflux. 7. Allergic rhinitis. 8. Morbid obesity.  REASON FOR HOSPITALIZATION:  The patient was admitted through the emergency room secondary to severe shortness of breath and hypoxemia.  HOSPITAL COURSE:  Dr. Verdie Drown, Montez Hageman. consulted and pulmonology, given severe respiratory disease and decompensation. The patient was given oxygen and treated with IV azithromycin 500 IV, 500 q.d. and IV Maxipime 1 g q.d. Close follow of her diabetes was made, and an insulin drip was begun. Her diazepam, Vicodin, temazepam, and insulin were continued. Advair and nebulized albuterol and Singulair, and DuraTuss were begun. Avandia was begun. The patient responded well and oxygen saturations improved.  By day #2 of hospitalization, the patient was taking food well.  By day #3 of hospitalization, the patient stated she felt back to her normal self and was breathing comfortably on room air.  Her cephapirin was discontinued and azithromycin was changed to oral.  She tolerated this and remained stable regarding her vital signs, O2 saturation and her ease with breathing.  The next morning, she was discharged home.  DISCHARGE CONDITION:  The patient was well-appearing.  She was ambulating with ease, with minimal shortness of breath on room air.  DISCHARGE MEDICATIONS: 1. Effexor 150 p.o. q.d. 2. Albuterol and ipratropium MDI p.r.n. 3. Advair 100/ 50 b.i.d. 4. Singulair 10 q.d. 5. DuraTuss 1200 b.i.d. 6. Insulin as at admission. 7. Restoril 30 h.s. p.r.n. 8. Vicodin p.r.n. 9. Azithromycin 500 mg  p.o. q.d. x 5 days.  FOLLOWUP:  With Dr. Feliciana Rossetti two weeks following discharge. DD:  05/02/00 TD:  05/08/00 Job: [id number]

## 2011-04-23 NOTE — Discharge Summary (Signed)
Yvonne [name], Yvonne [name]                ACCOUNT NO.:  [id number]  MEDICAL RECORD NO.:  [id number]           PATIENT TYPE:  I  LOCATION:  5509                         FACILITY:  [facility]  PHYSICIAN:  Roger Fasnacht, DO         DATE OF BIRTH:  May 14, 1946  DATE OF ADMISSION:  04/02/2011 DATE OF DISCHARGE:  04/07/2011                              DISCHARGE SUMMARY   ADMISSION DIAGNOSES: 1. Congestive heart failure exacerbation. 2. Diabetes. 3. Morbid obesity. 4. Tobacco abuse. 5. Severe depression.  HISTORY OF PRESENT ILLNESS:  Please see H and P.  HOSPITAL COURSE:  The patient was admitted.  She was aggressively diuresed.  She was placed on a low-sodium diet with fluid restriction. A 2-D echo was obtained which demonstrated a 55% ejection fraction, no wall motion abnormalities.  Peak PA pressures were 47 mmHg.  Wall thickness was normal in left and right ventricles.  The patient also was hyponatremic.  She was placed on the fluid restriction.  The morning after admission, the patient was arousable but very lethargic.  An ABG was obtained, and the patient was found to be hypercarbic.  She was admitted to step-down.  She was placed on BiPAP at 16 and 8 with just enough bleed in O2 to keep her sats above 90.  She was also started on IV antibiotics, given breathing treatments.  She was continued on her medications.  Her volumes were monitored carefully overnight.  The patient was also placed on steroids.  Overnight, the patient had a great deal of agitation and confusion.  Her steroids were stopped.  The patient had a notable apnea.  Her Ambien was held the following day. The patient was tried off BiPAP as her ABG was somewhat improved, but she continued to be sleepy and continued to have apneic periods.  She was returned to BiPAP overnight on Apr 14, 2011.  Her fingerstick blood sugars were elevated secondary to the steroids but again these had been stopped.  Her hyponatremia resolved.  On the  morning of Apr 05, 2011, the patient was wide-awake.  She was monitored throughout the day and another ABG was checked later in the afternoon.  She was transferred out of step down.  Yesterday, the patient was feeling quite a bit better. She was on room air.  She was breathing comfortably.  Today, the patient was walking in the halls with PT on room air quite comfortably, in no problem.  She has been eating and drinking well.  She is awake, alert, and oriented x3, and she is prepared for discharge.  DISCHARGE MEDICATIONS: 1. Acetaminophen 650 mg one p.o. q.4 h. as needed for pain. 2. She will be given an AeroChamber to use with her Combivent MDI. 3. Augmentin 500 mg one p.o. 3 times daily x3 days. 4. Guaifenesin 600 mg two tablets by mouth twice daily. 5. Combivent inhaler 1 puff inhaled q.6 h. and q.2 h. as needed for     dyspnea. 6. Nicotine patch one transdermal daily. 7. Zithromax 500 mg one p.o. daily x3 days. 8. Citalopram 40 mg once p.o. daily. 9. Clonazepam  1 mg p.o. t.i.d. as needed for anxiety. 10.Lantus 25-45 units subcu daily at bedtime according to sliding     scale which the patient has been previously prescribed. 11.Levocetirizine 5 mg one by mouth daily. 12.Metformin 500 mg one p.o. b.i.d. 13.Mirtazapine 15 mg one p.o. daily at bedtime. 14.Oxycodone 10/325 one by mouth four times daily as needed for pain. 15.Ranitidine 150 mg two p.o. twice daily.  ALLERGIES:  CODEINE, HYDROCODONE, ASPIRIN.  The patient is to follow a medium carb-modified diet.  She is to increase her activity slowly.  She will have home health PT, and she is to follow up with the [facility] in 2-4 weeks.  She is to stop smoking.  DISCHARGE DIAGNOSES: 1. Acute bronchitis. 2. Chronic obstructive pulmonary disease exacerbation. 3. Diabetes mellitus, type 2. 4. Respiratory failure which is resolved. 5. Hyponatremia which is resolved. 6. Apnea which is resolved. 7. Morbid obesity. 8. Tobacco  abuse.  I spent 40 minutes on this discharge.          ______________________________ Fran Lowes, DO     AS/MEDQ  D:  04/07/2011  T:  04/08/2011  Job:  [id number]  cc:   [facility]  Electronically Signed by Fran Lowes DO on 04/22/2011 11:50:48 PM

## 2011-04-25 NOTE — H&P (Signed)
NAME:  Yvonne [name], Yvonne [name]                ACCOUNT NO.:  [id number]  MEDICAL RECORD NO.:  [id number]           PATIENT TYPE:  I  LOCATION:  4740                         FACILITY:  [facility]  PHYSICIAN:  Lonia Blood, M.D.      DATE OF BIRTH:  February 11, 1946  DATE OF ADMISSION:  04/02/2011 DATE OF DISCHARGE:                             HISTORY & PHYSICAL   PRIMARY CARE PHYSICIAN:  [facility].  PRESENTING COMPLAINT:  Shortness of breath.  HISTORY OF PRESENT ILLNESS:  The patient is a 65 year old female with morbid obesity and known history of diabetes, but no known hypertension. She presented with progressive shortness of breath that has been going up about a week.  Per the patient, she has had on and off shortness of breath for weeks.  She also smokes about a pack per day.  Today, she went to the funeral for her brother and out there she was so short of breath that she could not hold off, hence she decided to come to the emergency room.  She has associated cough, PND, and orthopnea.  The shortness of breath seemed to be worsened by exertion.  She denied any prior history of sleep apnea.  Denied any past history of CHF.  She denied any hemoptysis.  Denied any chest pain at this point.  No relieving factor, although it is aggravated by exertion and lying flat. She occasionally has some diaphoresis and some nausea.  She had 2 episodes of vomiting, but she also noted progressive leg swelling over the last 1 week.  PAST MEDICAL HISTORY:  Significant for: 1. Diabetes. 2. Depression. 3. Morbid obesity. 4. Tobacco dependence. 5. Seizure disorder. 6. Chronic pain syndrome. 7. Insomnia. 8. COPD. 9. GERD.  ALLERGIES:  She has no known drug allergies.  CURRENT MEDICATIONS:  Advair Diskus, Ambien, citalopram, clonazepam, Lantus insulin subcutaneously, levocetirizine, metformin, mirtazapine, oxycodone, acetaminophen, and ranitidine.  SOCIAL HISTORY:  The patient lives in [place] with her son  who is here with her.  She smokes about 1 pack per day.  She is disabled. Denied any alcohol or IV drug use.  FAMILY HISTORY:  Significant for hypertension and diabetes.  REVIEW OF SYSTEMS:  The patient is depressed, otherwise all systems reviewed are negative except per HPI.  PHYSICAL EXAMINATION:  VITAL SIGNS:  Temperature 98.1, initial blood pressure 124/67, pulse 89, respiratory rate 18, and her sats on 2 liters is 99%. GENERAL:  She is morbidly obese woman, looks depressed, in tears while talking, but in no acute distress. HEENT:  PERRL.  EOMI.  No pallor, no jaundice, no rhinorrhea. NECK:  Supple with some mild JVD onto the angle of the jaw. RESPIRATORY:  She has fair air entry at the bases with widespread crackles. CARDIOVASCULAR:  She has S1 and S2.  No significant murmur. ABDOMEN:  Obese, soft, and nontender with positive bowel sounds. EXTREMITIES:  No edema, cyanosis, or clubbing.  LABORATORY DATA:  Her white count is 14,100, hemoglobin 12.1 with platelet 280.  Sodium 127, potassium 5.5, chloride 91, CO2 of 30, glucose 183, BUN 10, creatinine 0.99, albumin is 3.2, and calcium 9.9. ProBNP is only  266.  Troponin I is less than 0.30.  Her CK is 139 with an MB of 4.2, but her index is high at 3.0.  Urinalysis showed only cloudy urine.  Chest x-ray showed mild increase of heart size and pulmonary vascular disease suggesting early congestive change.  No evidence of edema or pneumonia demonstrated.  Her EKG showed normal sinus rhythm with low voltage QRS/left anterior fascicular block, but no significant ST-T wave changes.  She does have poor R-wave progression, but no prior EKG to compare.  ASSESSMENT:  This is a 65 year old female with longstanding history of diabetes presenting with what appears to be acute or rather new congestive heart failure exacerbation.  PLAN:  At this point will be: 1,  CHF exacerbation.  The patient has no prior history of CHF exacerbation, but  she seems to be having an attack at this point.  Plan will be to admit the patient to tele bed.  Aggressive diuresis.  Put her on low-sodium diet with fluid restriction.  We will get a 2-D echo in the morning and get cardiology consult. 1. Diabetes.  Continue with her home dose insulin with sliding scale     insulin. 2. Morbid obesity.  The patient is morbidly obese, but stable.  She     does not need any bariatric bed at this point.  We will continue     supportive care and counseling. 3. Tobacco abuse.  I will put her on some nicotine patch and tobacco     cessation counseling. 4. Severe depression.  She is currently on citalopram and clonazepam.     If her depression seems to be getting worse, we will get psych     consult in the hospital, otherwise further treatment depends on     what her EF is.  But I suppose this may be right-sided heart     failure from some asthma and COPD.  We will continue therefore with     empiric nebulizers.     Lonia Blood, M.D.     Verlin Grills  D:  04/03/2011  T:  04/03/2011  Job:  [id number]  Electronically Signed by Lonia Blood M.D. on 04/25/2011 11:45:43 AM

## 2011-05-06 ENCOUNTER — Encounter: Payer: Medicare Other | Admitting: Physical Medicine and Rehabilitation

## 2011-06-05 ENCOUNTER — Encounter: Payer: Medicare Other | Admitting: Physical Medicine and Rehabilitation

## 2011-07-02 ENCOUNTER — Encounter: Payer: Medicare Other | Attending: Neurosurgery | Admitting: Neurosurgery

## 2011-07-02 DIAGNOSIS — M79609 Pain in unspecified limb: Secondary | ICD-10-CM | POA: Insufficient documentation

## 2011-07-02 DIAGNOSIS — R209 Unspecified disturbances of skin sensation: Secondary | ICD-10-CM | POA: Insufficient documentation

## 2011-07-02 DIAGNOSIS — G894 Chronic pain syndrome: Secondary | ICD-10-CM | POA: Insufficient documentation

## 2011-07-02 DIAGNOSIS — F3289 Other specified depressive episodes: Secondary | ICD-10-CM | POA: Insufficient documentation

## 2011-07-02 DIAGNOSIS — F329 Major depressive disorder, single episode, unspecified: Secondary | ICD-10-CM

## 2011-07-02 DIAGNOSIS — F17200 Nicotine dependence, unspecified, uncomplicated: Secondary | ICD-10-CM | POA: Insufficient documentation

## 2011-07-02 NOTE — Assessment & Plan Note (Signed)
This patient of Dr. Pamelia Hoit seen for right upper extremity pain.  She states her pain is unchanged.  She has been treated by Dr. Rennis Chris, [facility].  She rates her pain as 7-9, sharp, burning, dull, stabbing and constant pain.  Her general activity level is 10.  Pain is same 24 hours a day.  Sleep patterns are poor.  Pain is aggravated with walking, bending and most activities.  Rest, therapy and pacing tend to help.  She walks without assistance.  She can walk.  She can climb steps.  She does not drive.  She is on disability.  REVIEW OF SYSTEMS:  Notable for the difficulties described above as well as some bowel or bladder issues, numbness, tingling, trouble walking, spasms, dizziness, GI issues, confusion, depression, anxiety, no suicidal thoughts or aberrant behavior.  She has some blood sugar fluctuations as well.  PAST MEDICAL HISTORY:  Unchanged.  SOCIAL HISTORY:  She is widowed.  She lives with her niece and son.  FAMILY HISTORY:  Unchanged.  PHYSICAL EXAMINATION:  VITAL SIGNS:  Her blood pressure is 138/65, pulse 83, respirations 18 and O2 sats 96 on room air. EXTREMITIES:  Motor strength is good in the upper and lower extremities. Sensation is intact. NEUROLOGIC:  Constitutionally, she is morbidly obese.  She is alert and oriented x3.  She does have a limp.  ASSESSMENT: 1. Chronic pain syndrome. 2. Paresthesias, right upper extremity. 3. Depression. 4. Tobacco abuse. 5. Morbid obesity.  PLAN:  I refilled Percocet 10/325 one p.o. q.i.d. 120 with no refill. She will follow up with Dr. Pamelia Hoit in 1 month.  Her questions were encouraged and answered.     Yvonne [name] Electronically Signed    RLW/MedQ D:  07/02/2011 11:24:35  T:  07/02/2011 22:55:16  Job #:  [id number]

## 2011-07-31 ENCOUNTER — Encounter
Payer: Medicare Other | Attending: Physical Medicine and Rehabilitation | Admitting: Physical Medicine and Rehabilitation

## 2011-07-31 DIAGNOSIS — M25519 Pain in unspecified shoulder: Secondary | ICD-10-CM | POA: Insufficient documentation

## 2011-07-31 DIAGNOSIS — M545 Low back pain, unspecified: Secondary | ICD-10-CM

## 2011-07-31 DIAGNOSIS — M79609 Pain in unspecified limb: Secondary | ICD-10-CM | POA: Insufficient documentation

## 2011-07-31 DIAGNOSIS — M25569 Pain in unspecified knee: Secondary | ICD-10-CM | POA: Insufficient documentation

## 2011-07-31 DIAGNOSIS — G894 Chronic pain syndrome: Secondary | ICD-10-CM

## 2011-07-31 NOTE — Assessment & Plan Note (Signed)
HISTORY OF PRESENT ILLNESS:  [name] is a pleasant 65 year old woman who is accompanied by her son again today to [facility].  She is followed here at our [facility] for chronic pain complaints related to her right upper extremity, her low back, and her knees.  She is back in today.  She had a recent hospitalization for COPD.  She is complaining of bilateral knee pain today.  Her right arm is actually much less painful than it had been.  Her chief complaint with respect to the right arm is some decrease coordination in the fingers.  Average pain is about 8 on a scale of 10.  Pain is worse with walking, bending, standing, prolonged sitting.  Pain improved significantly with medication.  MEDICATIONS:  Medications prescribed through [facility] include: 1. Percocet 10/325 four times a day. 2. Gabapentin 400 mg q.4 h.  FUNCTIONAL STATUS:  She can walk about 4 minutes at a time.  She has difficulty with stairs and does not drive.  She requires some assistance with ADLs.  REVIEW OF SYSTEMS:  Unchanged.  She denies suicidal ideation.  PAST MEDICAL/SOCIAL/FAMILY HISTORY:  Unchanged.  She continues to smoke about a pack to half-pack of cigarettes a day.  I cautioned against this.  PHYSICAL EXAMINATION:  VITAL SIGNS:  Blood pressure is 121/54, pulse 92, respirations 18, and 93-95% saturated on room air. NEUROLOGIC:  She is an obese woman with significant truncal obesity. She is oriented x3.  Speech is clear.  Affect is bright.  She is alert, cooperative, and pleasant.  Follows commands without difficulty. Answers my questions appropriately.  Cranial nerves to coordination are intact.  Reflexes are diminished in the lower extremities.  There is no abnormal tone, clonus, or tremors.  Motor strength is 5/5 at hip flexors, knee extensors, dorsiflexors, and plantar flexors.  Internal and external rotation at  the hips does not aggravate pain.  She has some tenderness along the lateral joint line, especially on the right and there is not an effusion present.  There is no significant AP instability, however, she has a little bit of medial lateral laxity on the right.  Transitioning from sitting to standing is slow.  She has slow and careful gait difficulty with tandem gait is noted.  She has some difficulty with Romberg test as well.  IMPRESSION: 1. Status post fall, June 12, 2010 with a history of right shoulder     dislocation and subsequent right brachial plexus injury with     residual incoordination with fine motor movements.  Pain is     significantly improved. 2. History of lumbago. 3. Prior history of left groin debridement with significant tissue     loss. 4. Bilateral knee pain.  PLAN:  We will obtain radiographs of her knees to evaluate further.  We will refill her pain medications, Percocet 10/325 four times a day.  She does not need a gabapentin refill at this time.  Her last urine drug screen was on Apr 01, 2011 and was consistent.  We discussed altering the timing of her gabapentin dosages, however, she finds that the q.4 h. dosage is quite helpful to help manage her pain and is not interested in changing at this time.  I will see her back in 3 weeks and review her knee radiographs with her at that time.  I have answered all her questions.  She is comfortable with our plan.  Brantley Stage, M.D. Electronically Signed    DMK/MedQ D:  07/31/2011 13:42:18  T:  07/31/2011 17:49:51  Job #:  [id number]

## 2011-08-21 ENCOUNTER — Encounter: Payer: Medicare Other | Admitting: Physical Medicine and Rehabilitation

## 2011-08-22 LAB — COMPREHENSIVE METABOLIC PANEL
ALT: 26
ALT: 33
AST: 69 — ABNORMAL HIGH
AST: 64 — ABNORMAL HIGH
Albumin: 2.9 — ABNORMAL LOW
Albumin: 2.2 — ABNORMAL LOW
Alkaline Phosphatase: 70
Alkaline Phosphatase: 68
BUN: 11
BUN: 16
CO2: 25
CO2: 23
Calcium: 8.9
Calcium: 8.3 — ABNORMAL LOW
Chloride: 92 — ABNORMAL LOW
Chloride: 94 — ABNORMAL LOW
Creatinine, Ser: 1.06
Creatinine, Ser: 1.24 — ABNORMAL HIGH
GFR calc Af Amer: >60
GFR calc Af Amer: 53 — ABNORMAL LOW
GFR calc non Af Amer: 52 — ABNORMAL LOW
GFR calc non Af Amer: 44 — ABNORMAL LOW
Glucose, Bld: 102 — ABNORMAL HIGH
Glucose, Bld: 87
Potassium: 4.1
Potassium: 3.8
Sodium: 127 — ABNORMAL LOW
Sodium: 125 — ABNORMAL LOW
Total Bilirubin: 0.7
Total Bilirubin: 0.5
Total Protein: 5.6 — ABNORMAL LOW
Total Protein: 5.3 — ABNORMAL LOW

## 2011-08-22 LAB — URINALYSIS, ROUTINE W REFLEX MICROSCOPIC
Bilirubin Urine: NEGATIVE
Glucose, UA: NEGATIVE
Ketones, ur: 15 — AB
Leukocytes, UA: NEGATIVE
Nitrite: NEGATIVE
Protein, ur: 100 — AB
Specific Gravity, Urine: 1.017
Urobilinogen, UA: 0.2
pH: 6

## 2011-08-22 LAB — CBC
HCT: 37.3
HCT: 29.3 — ABNORMAL LOW
HCT: 31.2 — ABNORMAL LOW
HCT: 32.9 — ABNORMAL LOW
Hemoglobin: 12.3
Hemoglobin: 10.7 — ABNORMAL LOW
Hemoglobin: 9.4 — ABNORMAL LOW
Hemoglobin: 9.9 — ABNORMAL LOW
MCHC: 32.9
MCHC: 31.8
MCHC: 32
MCHC: 32.4
MCV: 74.4 — ABNORMAL LOW
MCV: 75.6 — ABNORMAL LOW
MCV: 75.7 — ABNORMAL LOW
MCV: 76.4 — ABNORMAL LOW
Platelets: 226
Platelets: 205
Platelets: 238
Platelets: 252
RBC: 5.01
RBC: 3.84 — ABNORMAL LOW
RBC: 4.12
RBC: 4.35
RDW: 13.9
RDW: 14.2
RDW: 14.4
RDW: 14.4
WBC: 14.6 — ABNORMAL HIGH
WBC: 7.6
WBC: 8.6
WBC: 8.7

## 2011-08-22 LAB — BASIC METABOLIC PANEL
BUN: 12
BUN: 13
CO2: 22
CO2: 24
Calcium: 8.9
Calcium: 9.1
Chloride: 100
Chloride: 102
Creatinine, Ser: 0.91
Creatinine, Ser: 0.98
GFR calc Af Amer: >60
GFR calc Af Amer: >60
GFR calc non Af Amer: 57 — ABNORMAL LOW
GFR calc non Af Amer: >60
Glucose, Bld: 117 — ABNORMAL HIGH
Glucose, Bld: 121 — ABNORMAL HIGH
Potassium: 3.8
Potassium: 4.3
Sodium: 129 — ABNORMAL LOW
Sodium: 130 — ABNORMAL LOW

## 2011-08-22 LAB — URINE CULTURE
Colony Count: NO GROWTH
Culture: NO GROWTH

## 2011-08-22 LAB — IRON AND TIBC
Iron: 24 — ABNORMAL LOW
Saturation Ratios: 13 — ABNORMAL LOW
TIBC: 186 — ABNORMAL LOW
UIBC: 162

## 2011-08-22 LAB — DIFFERENTIAL
Basophils Absolute: 0.1
Basophils Relative: 1
Eosinophils Absolute: 0
Eosinophils Relative: 0
Lymphocytes Relative: 8 — ABNORMAL LOW
Lymphs Abs: 1.2
Monocytes Absolute: 1
Monocytes Relative: 7
Neutro Abs: 12.3 — ABNORMAL HIGH
Neutrophils Relative %: 84 — ABNORMAL HIGH

## 2011-08-22 LAB — VITAMIN B12: Vitamin B-12: 391 (ref 211–911)

## 2011-08-22 LAB — INFLUENZA A+B VIRUS AG-DIRECT(RAPID)
Inflenza A Ag: NEGATIVE
Influenza B Ag: NEGATIVE

## 2011-08-22 LAB — CULTURE, BLOOD (ROUTINE X 2)
Culture: NO GROWTH
Culture: NO GROWTH

## 2011-08-22 LAB — URINE MICROSCOPIC-ADD ON

## 2011-08-22 LAB — FOLATE: Folate: 19.6

## 2011-08-22 LAB — HEMOGLOBIN A1C
Hgb A1c MFr Bld: 6.6 — ABNORMAL HIGH
Mean Plasma Glucose: 158

## 2011-08-22 LAB — RETICULOCYTES
RBC.: 4.43
Retic Count, Absolute: 17.7 — ABNORMAL LOW
Retic Ct Pct: 0.4

## 2011-08-22 LAB — FERRITIN: Ferritin: 1655 — ABNORMAL HIGH (ref 10–291)

## 2011-08-29 LAB — BASIC METABOLIC PANEL
BUN: 5 mg/dL — ABNORMAL LOW (ref 6–23)
CO2: 26 mEq/L (ref 19–32)
Calcium: 9.3 mg/dL (ref 8.4–10.5)
Chloride: 101 mEq/L (ref 96–112)
Creatinine, Ser: 0.86 mg/dL (ref 0.4–1.2)
GFR calc Af Amer: >60 mL/min (ref >60)
GFR calc non Af Amer: >60 mL/min (ref >60)
Glucose, Bld: 91 mg/dL (ref 70–99)
Potassium: 4.1 mEq/L (ref 3.5–5.1)
Sodium: 133 mEq/L — ABNORMAL LOW (ref 135–145)

## 2011-08-29 LAB — CBC
HCT: 39.1 % (ref 36.0–46.0)
Hemoglobin: 12.3 g/dL (ref 12.0–15.0)
MCHC: 31.3 g/dL (ref 30.0–36.0)
MCV: 77.2 fL — ABNORMAL LOW (ref 78.0–100.0)
Platelets: 270 10*3/uL (ref 150–400)
RBC: 5.07 MIL/uL (ref 3.87–5.11)
RDW: 14.3 % (ref 11.5–15.5)
WBC: 11.6 10*3/uL — ABNORMAL HIGH (ref 4.0–10.5)

## 2011-08-29 LAB — RAPID URINE DRUG SCREEN, HOSP PERFORMED
Amphetamines: NOT DETECTED
Barbiturates: NOT DETECTED
Benzodiazepines: POSITIVE — AB
Cocaine: NOT DETECTED
Opiates: NOT DETECTED
Tetrahydrocannabinol: NOT DETECTED

## 2011-08-29 LAB — DIFFERENTIAL
Basophils Absolute: 0.1 10*3/uL (ref 0.0–0.1)
Basophils Relative: 1 % (ref 0–1)
Eosinophils Absolute: 0.3 10*3/uL (ref 0.0–0.7)
Eosinophils Relative: 2 % (ref 0–5)
Lymphocytes Relative: 44 % (ref 12–46)
Lymphs Abs: 5.1 10*3/uL — ABNORMAL HIGH (ref 0.7–4.0)
Monocytes Absolute: 0.8 10*3/uL (ref 0.1–1.0)
Monocytes Relative: 7 % (ref 3–12)
Neutro Abs: 5.3 10*3/uL (ref 1.7–7.7)
Neutrophils Relative %: 46 % (ref 43–77)

## 2011-08-29 LAB — POCT CARDIAC MARKERS
CKMB, poc: <1 ng/mL — ABNORMAL LOW (ref 1.0–8.0)
CKMB, poc: <1 ng/mL — ABNORMAL LOW (ref 1.0–8.0)
Myoglobin, poc: 38.2 ng/mL (ref 12–200)
Myoglobin, poc: 48.3 ng/mL (ref 12–200)
Troponin i, poc: <0.05 ng/mL (ref 0.00–0.09)
Troponin i, poc: <0.05 ng/mL (ref 0.00–0.09)

## 2011-08-29 LAB — PROTIME-INR
INR: 1 (ref 0.00–1.49)
Prothrombin Time: 12.8 seconds (ref 11.6–15.2)

## 2011-08-29 LAB — URINALYSIS, ROUTINE W REFLEX MICROSCOPIC
Bilirubin Urine: NEGATIVE
Glucose, UA: NEGATIVE mg/dL
Hgb urine dipstick: NEGATIVE
Ketones, ur: NEGATIVE mg/dL
Nitrite: NEGATIVE
Protein, ur: NEGATIVE mg/dL
Specific Gravity, Urine: 1.007 (ref 1.005–1.030)
Urobilinogen, UA: 0.2 mg/dL (ref 0.0–1.0)
pH: 6.5 (ref 5.0–8.0)

## 2011-08-29 LAB — GLUCOSE, CAPILLARY
Glucose-Capillary: 63 mg/dL — ABNORMAL LOW (ref 70–99)
Glucose-Capillary: 91 mg/dL (ref 70–99)

## 2011-08-29 LAB — APTT: aPTT: 31 seconds (ref 24–37)

## 2011-09-02 ENCOUNTER — Encounter
Payer: Medicare Other | Attending: Physical Medicine and Rehabilitation | Admitting: Physical Medicine and Rehabilitation

## 2011-09-02 DIAGNOSIS — M25569 Pain in unspecified knee: Secondary | ICD-10-CM

## 2011-09-02 DIAGNOSIS — G894 Chronic pain syndrome: Secondary | ICD-10-CM

## 2011-09-02 DIAGNOSIS — M25649 Stiffness of unspecified hand, not elsewhere classified: Secondary | ICD-10-CM | POA: Insufficient documentation

## 2011-09-02 DIAGNOSIS — R209 Unspecified disturbances of skin sensation: Secondary | ICD-10-CM

## 2011-09-02 DIAGNOSIS — S4490XS Injury of unspecified nerve at shoulder and upper arm level, unspecified arm, sequela: Secondary | ICD-10-CM | POA: Insufficient documentation

## 2011-09-02 DIAGNOSIS — W19XXXS Unspecified fall, sequela: Secondary | ICD-10-CM | POA: Insufficient documentation

## 2011-09-02 DIAGNOSIS — M545 Low back pain, unspecified: Secondary | ICD-10-CM

## 2011-09-02 DIAGNOSIS — G8929 Other chronic pain: Secondary | ICD-10-CM | POA: Insufficient documentation

## 2011-09-03 NOTE — Assessment & Plan Note (Signed)
[name] is a pleasant 65 year old woman who is accompanied by her son to our [facility].  She is seen here at [facility] for multiple pain complaints.  She has been recovering for over a year now from a right brachial plexus injury, status post a fall.  She had been followed by orthopedic surgeon, Dr. Rennis Chris for some time.  She has a history of low back pain.  She has a history of significant left groin debridement tissue loss remotely.  She has had some complaints more recently of bilateral knee pain. Radiographs were ordered, however, she did not get them completed. Today, she is also complaining of some hand achiness and stiffness bilaterally.  She has been seen at [facility] over a year ago. Apparently, there was a mildly positive rheumatoid factor at that time. She has not continued to follow up at [facility] for this, however.  She continues to report average pain about 9 on a scale of 10.  She reports fair to good relief with current medications.  She continues to use gabapentin every 4 hours and finds it to be quite helpful.  She also uses 5-10/325 Percocet per day.  She missed her last appointment and subsequently has run out of her pain medications due to not making her appointment week and a half ago.  There is no changes with respect to function.  She can walk 4-5 minutes at a time.  She is able to climb stairs.  She does not drive.  She reports requiring some assistance with ADL and denies suicidal ideation. Admits to occasional bladder problems, weakness, numbness, tremors, tingling, trouble walking, spasms, dizziness, confusion, depression and anxiety.  She also has multiple complaints with respect to review of systems.  I asked her to maintain contact with primary care for these other issues.  PAST MEDICAL, SOCIAL AND FAMILY HISTORY:  Unchanged other than recent flu symptoms.  PHYSICAL EXAMINATION:  Her blood pressure  is 160/91, pulse 93, respirations 18 and 96% saturated on room air.  She is an obese woman who appears her stated age and does not appear in any distress.  She is oriented x3.  Speech is clear.  Her affect is bright.  She is alert, cooperative and pleasant.  Follows commands without difficulty.  Answers my questions appropriately.  Cranial nerves and coordination are grossly intact.  Her reflexes are 1+ in the upper extremities and diminished in the lower extremities.  No abnormal tone, clonus, or tremors are noted. Motor strength overall is quite good, 5/5 in upper and lower extremities.  Transitions from sitting to standing without difficulty.  Some difficulty with tandem gait is noted.  Romberg test is performed adequately.  She complains of some discomfort with neck range of motion as well as lumbar motion specially end range forward flexion with respect to lumbar spine and end range extension with respect to lumbar spine.  Sitting, internal and external rotation of the hips does not aggravate pain.  Bilateral knees are evaluated. She has no obvious effusion, although she does have excess soft tissue in these areas.  She does not appear to have any crepitus with flexion, extension at the knees, but she does complain of pain.  She has some joint line pain especially medial right knee.  No obvious AP or lateral instability other than some medial laxity on the right is appreciated.  IMPRESSION: 1. Bilateral knee pain.  Radiographs were ordered at the last visit,  but the patient did not get these completed.  I will reorder them     again today and see her back in 3 weeks. 2. History of chronic low back pain. 3. History of left groin debridement with significant tissue loss     remotely. 4. Status post fall June 12, 2010, with history of right shoulder     dislocation and subsequent brachial plexus injury with residual     incoordination with fine motor movements.  However,  pain is     significantly improved over the last year. 5. The patient complains again today of some bilateral hand stiffness     and discomfort especially in the area of the metacarpophalangeal     joint.  She does have some thickening bilaterally.  She was seen     over a year ago at [facility] and apparently a [facility]     was told, she has a mildly positive rheumatoid factor.  She is     interested in following back up again with rheumatologist perhaps.     I asked her to follow up with primary care to see if she can get a     referral for this.  I may consider repeating rheumatoid factor ANA     and sed rate at the next visit as well.  I will see her back in 3     weeks.  We will  check pill counts, possibly UDS and review x-rays     at that time.  She is comfortable with the plan at this time.  I     have answered all her questions.     Brantley Stage, M.D. Electronically Signed    DMK/MedQ D:  09/02/2011 12:54:37  T:  09/03/2011 00:53:10  Job #:  [id number]

## 2011-09-05 LAB — BASIC METABOLIC PANEL
BUN: 5 — ABNORMAL LOW
BUN: 5 — ABNORMAL LOW
BUN: 9
CO2: 24
CO2: 26
CO2: 26
Calcium: 9.2
Calcium: 9.3
Calcium: 9.3
Chloride: 105
Chloride: 108
Chloride: 99
Creatinine, Ser: 0.93
Creatinine, Ser: 1.04
Creatinine, Ser: 1.09
GFR calc Af Amer: >60
GFR calc Af Amer: >60
GFR calc Af Amer: >60
GFR calc non Af Amer: 51 — ABNORMAL LOW
GFR calc non Af Amer: 54 — ABNORMAL LOW
GFR calc non Af Amer: >60
Glucose, Bld: 122 — ABNORMAL HIGH
Glucose, Bld: 57 — ABNORMAL LOW
Glucose, Bld: 90
Potassium: 4
Potassium: 4.1
Potassium: 4.5
Sodium: 131 — ABNORMAL LOW
Sodium: 137
Sodium: 138

## 2011-09-05 LAB — CULTURE, BLOOD (ROUTINE X 2)
Culture: NO GROWTH
Culture: NO GROWTH

## 2011-09-05 LAB — TROPONIN I: Troponin I: 0.03

## 2011-09-05 LAB — URINALYSIS, ROUTINE W REFLEX MICROSCOPIC
Bilirubin Urine: NEGATIVE
Glucose, UA: NEGATIVE
Hgb urine dipstick: NEGATIVE
Ketones, ur: NEGATIVE
Nitrite: NEGATIVE
Protein, ur: NEGATIVE
Specific Gravity, Urine: 1.005
Urobilinogen, UA: 0.2
pH: 7

## 2011-09-05 LAB — CBC
HCT: 33.6 — ABNORMAL LOW
HCT: 34.4 — ABNORMAL LOW
HCT: 35.4 — ABNORMAL LOW
HCT: 39.8
Hemoglobin: 10.8 — ABNORMAL LOW
Hemoglobin: 11 — ABNORMAL LOW
Hemoglobin: 11.5 — ABNORMAL LOW
Hemoglobin: 12.7
MCHC: 32
MCHC: 32.1
MCHC: 32.1
MCHC: 32.4
MCV: 74.5 — ABNORMAL LOW
MCV: 74.7 — ABNORMAL LOW
MCV: 75.4 — ABNORMAL LOW
MCV: 75.5 — ABNORMAL LOW
Platelets: 338
Platelets: 372
Platelets: 400
Platelets: 449 — ABNORMAL HIGH
RBC: 4.46
RBC: 4.56
RBC: 4.76
RBC: 5.33 — ABNORMAL HIGH
RDW: 14.1 — ABNORMAL HIGH
RDW: 14.2 — ABNORMAL HIGH
RDW: 14.3 — ABNORMAL HIGH
RDW: 14.3 — ABNORMAL HIGH
WBC: 12.5 — ABNORMAL HIGH
WBC: 12.8 — ABNORMAL HIGH
WBC: 16.9 — ABNORMAL HIGH
WBC: 9.3

## 2011-09-05 LAB — CARDIAC PANEL(CRET KIN+CKTOT+MB+TROPI)
CK, MB: 0.9
CK, MB: 1.1
Relative Index: INVALID
Relative Index: INVALID
Total CK: 18
Total CK: 24
Troponin I: 0.01
Troponin I: 0.02

## 2011-09-05 LAB — CULTURE, RESPIRATORY: Culture: NORMAL

## 2011-09-05 LAB — CK TOTAL AND CKMB (NOT AT ARMC)
CK, MB: 0.7
Relative Index: INVALID
Total CK: 17

## 2011-09-05 LAB — EXPECTORATED SPUTUM ASSESSMENT W GRAM STAIN, RFLX TO RESP C

## 2011-09-05 LAB — DIFFERENTIAL
Basophils Absolute: 0.1
Basophils Relative: 1
Eosinophils Absolute: 0.1
Eosinophils Relative: 1
Lymphocytes Relative: 26
Lymphs Abs: 4.4 — ABNORMAL HIGH
Monocytes Absolute: 0.7
Monocytes Relative: 4
Neutro Abs: 11.6 — ABNORMAL HIGH
Neutrophils Relative %: 69

## 2011-09-05 LAB — D-DIMER, QUANTITATIVE: D-Dimer, Quant: 0.53 — ABNORMAL HIGH

## 2011-09-05 LAB — POCT CARDIAC MARKERS
CKMB, poc: <1 — ABNORMAL LOW
Myoglobin, poc: 46.5
Operator id: 2725
Troponin i, poc: <0.05

## 2011-09-05 LAB — EXPECTORATED SPUTUM ASSESSMENT W REFEX TO RESP CULTURE

## 2011-09-05 LAB — HEMOGLOBIN A1C
Hgb A1c MFr Bld: 6.9 — ABNORMAL HIGH
Mean Plasma Glucose: 168

## 2011-09-23 ENCOUNTER — Encounter: Payer: Medicare Other | Admitting: Physical Medicine and Rehabilitation

## 2011-10-08 ENCOUNTER — Encounter: Payer: Medicare HMO | Attending: Neurosurgery | Admitting: Neurosurgery

## 2011-10-08 DIAGNOSIS — M545 Low back pain, unspecified: Secondary | ICD-10-CM | POA: Insufficient documentation

## 2011-10-08 DIAGNOSIS — M25569 Pain in unspecified knee: Secondary | ICD-10-CM | POA: Insufficient documentation

## 2011-10-08 DIAGNOSIS — G894 Chronic pain syndrome: Secondary | ICD-10-CM

## 2011-10-08 DIAGNOSIS — R5383 Other fatigue: Secondary | ICD-10-CM | POA: Insufficient documentation

## 2011-10-08 DIAGNOSIS — R339 Retention of urine, unspecified: Secondary | ICD-10-CM | POA: Insufficient documentation

## 2011-10-08 DIAGNOSIS — R5381 Other malaise: Secondary | ICD-10-CM | POA: Insufficient documentation

## 2011-10-08 DIAGNOSIS — R209 Unspecified disturbances of skin sensation: Secondary | ICD-10-CM | POA: Insufficient documentation

## 2011-10-08 DIAGNOSIS — R062 Wheezing: Secondary | ICD-10-CM | POA: Insufficient documentation

## 2011-10-08 DIAGNOSIS — K5900 Constipation, unspecified: Secondary | ICD-10-CM | POA: Insufficient documentation

## 2011-10-08 DIAGNOSIS — M7989 Other specified soft tissue disorders: Secondary | ICD-10-CM | POA: Insufficient documentation

## 2011-10-08 DIAGNOSIS — R059 Cough, unspecified: Secondary | ICD-10-CM | POA: Insufficient documentation

## 2011-10-08 DIAGNOSIS — R05 Cough: Secondary | ICD-10-CM | POA: Insufficient documentation

## 2011-10-08 DIAGNOSIS — M25519 Pain in unspecified shoulder: Secondary | ICD-10-CM | POA: Insufficient documentation

## 2011-10-08 NOTE — Assessment & Plan Note (Signed)
This patient of Dr. Pamelia Hoit is seen here for multiple pain complaints.  She has history of a brachial plexus injury as well as low back pain, shoulder pain, and knee pain.  She rates her pain at a 5-10. General activity level is 10.  Pain is worse in the evening.  Sleep patterns are fair.  All activities aggravate.  Rest and medication tend to help.  She walks without assistance.  She can walk about 15 minutes at a time.  She does not climb steps or drive.  She needs help with transfers.  She is retired.  She needs help with ADLs, dressing, household duties.  REVIEW OF SYSTEMS:  Notable for difficulties described above as well as some high and low blood sugar, constipation, urinary retention, poor appetite, limb swelling, coughing, wheezing, trouble with ambulation, paresthesias, weakness.  No suicidal thoughts or aberrant behaviors, although she did not bring her bottle in for pill count today and her son tells me she has been on Percocet for over a week.  PAST MEDICAL HISTORY:  Unchanged.  SOCIAL HISTORY:  Unchanged.  FAMILY HISTORY:  Unchanged.  PHYSICAL EXAMINATION:  Her blood pressure is 137/62, pulse 87, respirations 16, O2 sats 94% on room air.  Constitutionally, she is morbidly obese.  She is alert and oriented x3.  Somewhat depressed and tired appearing.  Gait is more less of a staggered limp.  Strength and sensation appear to be intact in the upper and lower extremities.  IMPRESSION: 1. Bilateral knee pain. 2. Chronic low back pain. 3. Status fall with subsequent brachial plexus injury.  PLAN:  I explained to the patient and her son that I could not refill her Percocet without a bottle here for count.  The patient needs UDS completed.  She and her son are supposed to bring the bottle back.  She did not get the x-rays.  Dr. Pamelia Hoit ordered at last appointment, so they are going to get that done today and the labs for the RA factor and sed rate were not drawn, so  we did get that done today or we are going to do it when she brings her bottle back for a count and a complete UDS as they do bring a while back.  Prescription will be on the chart; however, they have been counseled about not bringing in the bottles as a compliance issue with her contract and they stated understanding.  We will see when they bring that back and completed today and she can follow up with Dr. Pamelia Hoit in 2-3 weeks.     Humbert Morozov L. Blima Dessert Electronically Signed    RLW/MedQ D:  10/08/2011 10:05:08  T:  10/08/2011 10:42:23  Job #:  [id number]

## 2011-10-14 ENCOUNTER — Encounter: Payer: Medicare Other | Admitting: Physical Medicine and Rehabilitation

## 2011-11-01 ENCOUNTER — Encounter: Payer: Medicare HMO | Admitting: Physical Medicine and Rehabilitation

## 2011-11-07 ENCOUNTER — Encounter: Payer: Medicare HMO | Attending: Neurosurgery | Admitting: Neurosurgery

## 2011-11-07 DIAGNOSIS — M545 Low back pain, unspecified: Secondary | ICD-10-CM | POA: Insufficient documentation

## 2011-11-07 DIAGNOSIS — G894 Chronic pain syndrome: Secondary | ICD-10-CM

## 2011-11-07 DIAGNOSIS — M25569 Pain in unspecified knee: Secondary | ICD-10-CM | POA: Insufficient documentation

## 2011-11-07 DIAGNOSIS — G540 Brachial plexus disorders: Secondary | ICD-10-CM | POA: Insufficient documentation

## 2011-11-07 DIAGNOSIS — M1710 Unilateral primary osteoarthritis, unspecified knee: Secondary | ICD-10-CM | POA: Insufficient documentation

## 2011-11-08 NOTE — Assessment & Plan Note (Signed)
The patient of Dr. Pamelia Hoit seen for multiple pain complaints.  She has got a history of brachial plexus injury as well as low back pain. She reports no change in her pain at a 5-7.  She does not rate her pain or rate her activity level.  Pain is worse in the morning and night. Walking, bending, and standing aggravate.  Rest and medication helps. She walks without assistance.  She can climb steps and drive.  She needs some help with transfers.  Functionally she is unemployed.  She needs help with ADLs and household duties.  REVIEW OF SYSTEMS:  Notable for difficulties described above as well as some high blood sugar, constipation, trouble walking, and weakness.  No suicidal thoughts or aberrant behaviors/pill count.  UDS consistent.  PAST MEDICAL HISTORY:  Unchanged.  SOCIAL HISTORY:  Unchanged.  FAMILY HISTORY:  Unchanged.  PHYSICAL EXAMINATION:  Blood pressure is 111/44, pulse 93, respirations 16, and O2 saturations is 100 on room air.  Motor strength and sensation are intact.  Constitutionally, she is within normal limits. She is alert and oriented x3.  She does have a limp.  IMPRESSION: 1. Bilateral knee osteoarthritis/knee pain. 2. Chronic low back pain. 3. Status post fall with a subsequent brachial plexus injury.  PLAN:  Refill Percocet 10/325 1 p.o. q.i.d. 120 with no refill.  We did talk about her labs, which came back significant for a elevated rheumatoid factor.  She will follow up with Dr. Pamelia Hoit on November 14, 2011 and discuss referral to a rheumatologist.  She did not want a referal until she spoke with Dr. Pamelia Hoit.  Otherwise her questions were encouraged and answered.     Taimane Stimmel L. Blima Dessert     Brantley Stage, M.D. Electronically Signed   RLW/MedQ D:  11/07/2011 14:38:30  T:  11/08/2011 03:06:17  Job #:  [id number]

## 2011-11-14 ENCOUNTER — Ambulatory Visit: Payer: Medicare HMO | Admitting: Physical Medicine and Rehabilitation

## 2011-12-05 ENCOUNTER — Encounter: Payer: Medicare HMO | Attending: Neurosurgery | Admitting: Neurosurgery

## 2011-12-05 DIAGNOSIS — M25469 Effusion, unspecified knee: Secondary | ICD-10-CM | POA: Insufficient documentation

## 2011-12-05 DIAGNOSIS — M79609 Pain in unspecified limb: Secondary | ICD-10-CM | POA: Insufficient documentation

## 2011-12-05 DIAGNOSIS — M549 Dorsalgia, unspecified: Secondary | ICD-10-CM | POA: Insufficient documentation

## 2011-12-05 DIAGNOSIS — M545 Low back pain, unspecified: Secondary | ICD-10-CM | POA: Insufficient documentation

## 2011-12-05 DIAGNOSIS — M1710 Unilateral primary osteoarthritis, unspecified knee: Secondary | ICD-10-CM

## 2011-12-05 DIAGNOSIS — G894 Chronic pain syndrome: Secondary | ICD-10-CM

## 2011-12-05 DIAGNOSIS — M25569 Pain in unspecified knee: Secondary | ICD-10-CM | POA: Insufficient documentation

## 2011-12-06 NOTE — Assessment & Plan Note (Signed)
This is patient of Dr. Pamelia Hoit, seen for multiple pain complaints as well as the back, right arm and knee.  She states her right knee has been swelling and hurting.  She does have a positive rheumatoid factor, was supposed to follow up with Dr. Pamelia Hoit on November 19, 2011, for discussion about referral to a rheumatologist, however, she states that when they checked out, Omar  saw me on November 06, 2011, at that point, the front office canceled that appointment and sent her back to see me today.  She reports her pain at a 5-7, it is a constant, tingling, aching pain.  She does not indicate the general activity level what it worsens or improves the pain.  She can walk about 20 minutes at a time.  She does not climb steps or drive.  Functionally, she is on disability, needs help with ADLs and household duties.  REVIEW OF SYSTEMS:  Notable for difficulties as described above as well as some paresthesias, trouble walking.  No suicidal thoughts or aberrant behaviors.  She is about a day short on her pill count today, but otherwise consistent.  Last UDS consistent.  Past medical history, social history, family history are unchanged.  PHYSICAL EXAMINATION:  VITAL SIGNS:  Her blood pressure is 145/68, pulse 79, respirations 18, O2 sats 97 on room air. MUSCULOSKELETAL:  Motor strength, sensation are intact, and somewhat diminished in the lower extremities. NEUROLOGIC:  Constitutionally, she is obese, she is alert and oriented x3.  She has a slight limp.  There is mild crepitus in the right knee, but I do not see any edema today.  IMPRESSION: 1. Bilateral knee osteoarthritis, knee pain. 2. Chronic low back pain. 3. Question of rheumatoid arthritis.  PLAN:  Refill Percocet 10/325 one p.o. q.i.d. 120 with no refill.  Also going to send her over and get bilateral knee x-rays with AP and lateral of the knees, bring her back to Dr. Pamelia Hoit in the next couple weeks to discuss  rheumatoid arthritis versus supportive treatment here.  Her questions were encouraged and answered.  She is in agreement with this.     Audi Wettstein L. Blima Dessert Electronically Signed    RLW/MedQ D:  12/05/2011 09:56:47  T:  12/06/2011 03:40:27  Job #:  [id number]

## 2011-12-25 ENCOUNTER — Encounter: Payer: Medicare HMO | Admitting: Physical Medicine and Rehabilitation

## 2012-01-06 ENCOUNTER — Encounter: Payer: Medicare HMO | Attending: Neurosurgery

## 2012-01-06 DIAGNOSIS — R339 Retention of urine, unspecified: Secondary | ICD-10-CM | POA: Insufficient documentation

## 2012-01-06 DIAGNOSIS — M7989 Other specified soft tissue disorders: Secondary | ICD-10-CM | POA: Insufficient documentation

## 2012-01-06 DIAGNOSIS — R5381 Other malaise: Secondary | ICD-10-CM | POA: Insufficient documentation

## 2012-01-06 DIAGNOSIS — E1142 Type 2 diabetes mellitus with diabetic polyneuropathy: Secondary | ICD-10-CM

## 2012-01-06 DIAGNOSIS — M545 Low back pain, unspecified: Secondary | ICD-10-CM

## 2012-01-06 DIAGNOSIS — R059 Cough, unspecified: Secondary | ICD-10-CM | POA: Insufficient documentation

## 2012-01-06 DIAGNOSIS — R5383 Other fatigue: Secondary | ICD-10-CM | POA: Insufficient documentation

## 2012-01-06 DIAGNOSIS — R209 Unspecified disturbances of skin sensation: Secondary | ICD-10-CM | POA: Insufficient documentation

## 2012-01-06 DIAGNOSIS — R05 Cough: Secondary | ICD-10-CM | POA: Insufficient documentation

## 2012-01-06 DIAGNOSIS — G894 Chronic pain syndrome: Secondary | ICD-10-CM

## 2012-01-06 DIAGNOSIS — K5900 Constipation, unspecified: Secondary | ICD-10-CM | POA: Insufficient documentation

## 2012-01-06 DIAGNOSIS — R062 Wheezing: Secondary | ICD-10-CM | POA: Insufficient documentation

## 2012-01-06 DIAGNOSIS — M25569 Pain in unspecified knee: Secondary | ICD-10-CM | POA: Insufficient documentation

## 2012-01-06 DIAGNOSIS — M542 Cervicalgia: Secondary | ICD-10-CM

## 2012-01-06 DIAGNOSIS — M25519 Pain in unspecified shoulder: Secondary | ICD-10-CM | POA: Insufficient documentation

## 2012-01-23 ENCOUNTER — Other Ambulatory Visit: Payer: Self-pay | Admitting: Physical Medicine and Rehabilitation

## 2012-02-01 ENCOUNTER — Other Ambulatory Visit: Payer: Self-pay

## 2012-02-01 ENCOUNTER — Emergency Department (HOSPITAL_COMMUNITY): Payer: Medicare HMO

## 2012-02-01 ENCOUNTER — Inpatient Hospital Stay (HOSPITAL_COMMUNITY)
Admission: EM | Admit: 2012-02-01 | Discharge: 2012-02-05 | DRG: 193 | Disposition: A | Discharge status: Home / Self Care | Payer: Medicare HMO | Source: Ambulatory Visit | Attending: Internal Medicine | Admitting: Internal Medicine

## 2012-02-01 ENCOUNTER — Encounter (HOSPITAL_COMMUNITY): Payer: Self-pay | Admitting: Emergency Medicine

## 2012-02-01 DIAGNOSIS — R410 Disorientation, unspecified: Secondary | ICD-10-CM

## 2012-02-01 DIAGNOSIS — R197 Diarrhea, unspecified: Secondary | ICD-10-CM | POA: Diagnosis present

## 2012-02-01 DIAGNOSIS — G9340 Encephalopathy, unspecified: Secondary | ICD-10-CM

## 2012-02-01 DIAGNOSIS — F17200 Nicotine dependence, unspecified, uncomplicated: Secondary | ICD-10-CM | POA: Diagnosis present

## 2012-02-01 DIAGNOSIS — D72829 Elevated white blood cell count, unspecified: Secondary | ICD-10-CM | POA: Diagnosis present

## 2012-02-01 DIAGNOSIS — J9600 Acute respiratory failure, unspecified whether with hypoxia or hypercapnia: Secondary | ICD-10-CM | POA: Diagnosis present

## 2012-02-01 DIAGNOSIS — E871 Hypo-osmolality and hyponatremia: Secondary | ICD-10-CM | POA: Diagnosis present

## 2012-02-01 DIAGNOSIS — Z23 Encounter for immunization: Secondary | ICD-10-CM

## 2012-02-01 DIAGNOSIS — E119 Type 2 diabetes mellitus without complications: Secondary | ICD-10-CM | POA: Diagnosis present

## 2012-02-01 DIAGNOSIS — J189 Pneumonia, unspecified organism: Principal | ICD-10-CM | POA: Diagnosis present

## 2012-02-01 DIAGNOSIS — J40 Bronchitis, not specified as acute or chronic: Secondary | ICD-10-CM | POA: Diagnosis present

## 2012-02-01 DIAGNOSIS — R509 Fever, unspecified: Secondary | ICD-10-CM

## 2012-02-01 DIAGNOSIS — G894 Chronic pain syndrome: Secondary | ICD-10-CM | POA: Diagnosis present

## 2012-02-01 DIAGNOSIS — J449 Chronic obstructive pulmonary disease, unspecified: Secondary | ICD-10-CM

## 2012-02-01 DIAGNOSIS — R0902 Hypoxemia: Secondary | ICD-10-CM | POA: Diagnosis present

## 2012-02-01 DIAGNOSIS — J4489 Other specified chronic obstructive pulmonary disease: Secondary | ICD-10-CM | POA: Diagnosis present

## 2012-02-01 DIAGNOSIS — F29 Unspecified psychosis not due to a substance or known physiological condition: Secondary | ICD-10-CM | POA: Diagnosis present

## 2012-02-01 HISTORY — DX: Heart failure, unspecified: I50.9

## 2012-02-01 HISTORY — DX: Encounter for other specified aftercare: Z51.89

## 2012-02-01 HISTORY — DX: Essential (primary) hypertension: I10

## 2012-02-01 HISTORY — DX: Hyperlipidemia, unspecified: E78.5

## 2012-02-01 HISTORY — DX: Chronic obstructive pulmonary disease, unspecified: J44.9

## 2012-02-01 HISTORY — DX: Reserved for inherently not codable concepts without codable children: IMO0001

## 2012-02-01 LAB — ETHANOL: Alcohol, Ethyl (B): <11 mg/dL (ref 0–11)

## 2012-02-01 LAB — COMPREHENSIVE METABOLIC PANEL
ALT: 10 U/L (ref 0–35)
AST: 18 U/L (ref 0–37)
Albumin: 3.4 g/dL — ABNORMAL LOW (ref 3.5–5.2)
Alkaline Phosphatase: 95 U/L (ref 39–117)
BUN: 6 mg/dL (ref 6–23)
CO2: 26 mEq/L (ref 19–32)
Calcium: 9.7 mg/dL (ref 8.4–10.5)
Chloride: 99 mEq/L (ref 96–112)
Creatinine, Ser: 0.93 mg/dL (ref 0.50–1.10)
GFR calc Af Amer: 72 mL/min — ABNORMAL LOW (ref >90)
GFR calc non Af Amer: 62 mL/min — ABNORMAL LOW (ref >90)
Glucose, Bld: 197 mg/dL — ABNORMAL HIGH (ref 70–99)
Potassium: 3.8 mEq/L (ref 3.5–5.1)
Sodium: 137 mEq/L (ref 135–145)
Total Bilirubin: 0.2 mg/dL — ABNORMAL LOW (ref 0.3–1.2)
Total Protein: 6.6 g/dL (ref 6.0–8.3)

## 2012-02-01 LAB — POCT I-STAT 3, ART BLOOD GAS (G3+)
Acid-Base Excess: 2 mmol/L (ref 0.0–2.0)
Bicarbonate: 25.7 mEq/L — ABNORMAL HIGH (ref 20.0–24.0)
O2 Saturation: 90 %
Patient temperature: 102.4
TCO2: 27 mmol/L (ref 0–100)
pCO2 arterial: 40.5 mmHg (ref 35.0–45.0)
pH, Arterial: 7.419 — ABNORMAL HIGH (ref 7.350–7.400)
pO2, Arterial: 64 mmHg — ABNORMAL LOW (ref 80.0–100.0)

## 2012-02-01 LAB — DIFFERENTIAL
Basophils Absolute: 0 10*3/uL (ref 0.0–0.1)
Basophils Relative: 0 % (ref 0–1)
Eosinophils Absolute: 0.1 10*3/uL (ref 0.0–0.7)
Eosinophils Relative: 1 % (ref 0–5)
Lymphocytes Relative: 26 % (ref 12–46)
Lymphs Abs: 3.1 10*3/uL (ref 0.7–4.0)
Monocytes Absolute: 0.7 10*3/uL (ref 0.1–1.0)
Monocytes Relative: 6 % (ref 3–12)
Neutro Abs: 8.3 10*3/uL — ABNORMAL HIGH (ref 1.7–7.7)
Neutrophils Relative %: 68 % (ref 43–77)

## 2012-02-01 LAB — CBC
HCT: 39.1 % (ref 36.0–46.0)
Hemoglobin: 12.6 g/dL (ref 12.0–15.0)
MCH: 24.2 pg — ABNORMAL LOW (ref 26.0–34.0)
MCHC: 32.2 g/dL (ref 30.0–36.0)
MCV: 75 fL — ABNORMAL LOW (ref 78.0–100.0)
Platelets: 229 10*3/uL (ref 150–400)
RBC: 5.21 MIL/uL — ABNORMAL HIGH (ref 3.87–5.11)
RDW: 14.2 % (ref 11.5–15.5)
WBC: 12.2 10*3/uL — ABNORMAL HIGH (ref 4.0–10.5)

## 2012-02-01 MED ORDER — IBUPROFEN 200 MG PO TABS
400.0000 mg | ORAL_TABLET | Freq: Once | ORAL | Status: AC
  Administered 2012-02-01: 400 mg via ORAL
  Filled 2012-02-01: qty 2

## 2012-02-01 MED ORDER — BIOTENE DRY MOUTH MT LIQD
15.0000 mL | Freq: Two times a day (BID) | OROMUCOSAL | Status: DC
  Administered 2012-02-01 – 2012-02-05 (×7): 15 mL via OROMUCOSAL

## 2012-02-01 MED ORDER — ALBUTEROL SULFATE (5 MG/ML) 0.5% IN NEBU
INHALATION_SOLUTION | RESPIRATORY_TRACT | Status: AC
  Filled 2012-02-01: qty 1

## 2012-02-01 MED ORDER — CITALOPRAM HYDROBROMIDE 40 MG PO TABS
40.0000 mg | ORAL_TABLET | Freq: Every day | ORAL | Status: DC
  Administered 2012-02-02: 40 mg via ORAL
  Filled 2012-02-01: qty 1

## 2012-02-01 MED ORDER — SODIUM CHLORIDE 0.9 % IV SOLN
INTRAVENOUS | Status: DC
  Administered 2012-02-02 (×3): via INTRAVENOUS

## 2012-02-01 MED ORDER — DEXTROSE 5 % IV SOLN
1.0000 g | Freq: Every day | INTRAVENOUS | Status: DC
  Administered 2012-02-02 – 2012-02-04 (×3): 1 g via INTRAVENOUS
  Filled 2012-02-01 (×4): qty 10

## 2012-02-01 MED ORDER — DEXTROSE 5 % IV SOLN
500.0000 mg | Freq: Once | INTRAVENOUS | Status: AC
  Administered 2012-02-01: 500 mg via INTRAVENOUS
  Filled 2012-02-01: qty 500

## 2012-02-01 MED ORDER — PREDNISONE 20 MG PO TABS
60.0000 mg | ORAL_TABLET | Freq: Once | ORAL | Status: AC
  Administered 2012-02-01: 60 mg via ORAL
  Filled 2012-02-01: qty 3

## 2012-02-01 MED ORDER — INFLUENZA VIRUS VACC SPLIT PF IM SUSP
0.5000 mL | INTRAMUSCULAR | Status: AC
  Administered 2012-02-02: 0.5 mL via INTRAMUSCULAR
  Filled 2012-02-01: qty 0.5

## 2012-02-01 MED ORDER — LEVALBUTEROL HCL 1.25 MG/3ML IN NEBU
1.2500 mg | INHALATION_SOLUTION | Freq: Once | RESPIRATORY_TRACT | Status: DC
Start: 2012-02-01 — End: 2012-02-01

## 2012-02-01 MED ORDER — ONDANSETRON HCL 4 MG/2ML IJ SOLN: 4.0000 mg | Freq: Four times a day (QID) | INTRAMUSCULAR | Status: DC | PRN

## 2012-02-01 MED ORDER — ALBUTEROL SULFATE (5 MG/ML) 0.5% IN NEBU
2.5000 mg | INHALATION_SOLUTION | Freq: Four times a day (QID) | RESPIRATORY_TRACT | Status: DC
  Administered 2012-02-02 – 2012-02-05 (×14): 2.5 mg via RESPIRATORY_TRACT
  Filled 2012-02-01 (×13): qty 0.5

## 2012-02-01 MED ORDER — ACETAMINOPHEN 650 MG RE SUPP: 650.0000 mg | Freq: Four times a day (QID) | RECTAL | Status: DC | PRN

## 2012-02-01 MED ORDER — IPRATROPIUM BROMIDE 0.02 % IN SOLN
0.5000 mg | Freq: Once | RESPIRATORY_TRACT | Status: AC
  Administered 2012-02-01: 0.5 mg via RESPIRATORY_TRACT
  Filled 2012-02-01: qty 2.5

## 2012-02-01 MED ORDER — HYDROCODONE-ACETAMINOPHEN 5-325 MG PO TABS
1.0000 | ORAL_TABLET | ORAL | Status: DC | PRN
  Administered 2012-02-02: 2 via ORAL
  Administered 2012-02-04: 1 via ORAL
  Administered 2012-02-05: 2 via ORAL
  Filled 2012-02-01 (×2): qty 2
  Filled 2012-02-01: qty 1

## 2012-02-01 MED ORDER — LEVALBUTEROL HCL 1.25 MG/0.5ML IN NEBU
1.2500 mg | INHALATION_SOLUTION | RESPIRATORY_TRACT | Status: AC
  Administered 2012-02-01: 1.25 mg via RESPIRATORY_TRACT
  Filled 2012-02-01: qty 0.5

## 2012-02-01 MED ORDER — PREDNISONE 20 MG PO TABS
40.0000 mg | ORAL_TABLET | Freq: Every day | ORAL | Status: DC
  Administered 2012-02-02 – 2012-02-04 (×3): 40 mg via ORAL
  Filled 2012-02-01 (×4): qty 2

## 2012-02-01 MED ORDER — SODIUM CHLORIDE 0.9 % IJ SOLN
3.0000 mL | Freq: Two times a day (BID) | INTRAMUSCULAR | Status: DC
  Administered 2012-02-02 – 2012-02-05 (×8): 3 mL via INTRAVENOUS

## 2012-02-01 MED ORDER — HEPARIN SODIUM (PORCINE) 5000 UNIT/ML IJ SOLN
5000.0000 [IU] | Freq: Three times a day (TID) | INTRAMUSCULAR | Status: DC
  Administered 2012-02-02 – 2012-02-05 (×11): 5000 [IU] via SUBCUTANEOUS
  Filled 2012-02-01 (×13): qty 1

## 2012-02-01 MED ORDER — ACETAMINOPHEN 325 MG PO TABS
650.0000 mg | ORAL_TABLET | Freq: Four times a day (QID) | ORAL | Status: DC | PRN
  Administered 2012-02-04 – 2012-02-05 (×3): 650 mg via ORAL
  Filled 2012-02-01 (×3): qty 2

## 2012-02-01 MED ORDER — ONDANSETRON HCL 4 MG PO TABS
4.0000 mg | ORAL_TABLET | Freq: Four times a day (QID) | ORAL | Status: DC | PRN
  Administered 2012-02-03: 4 mg via ORAL
  Filled 2012-02-01: qty 1

## 2012-02-01 MED ORDER — LEVALBUTEROL HCL 1.25 MG/3ML IN NEBU: 1.2500 mg | INHALATION_SOLUTION | RESPIRATORY_TRACT | Status: DC

## 2012-02-01 MED ORDER — ACETAMINOPHEN 325 MG PO TABS
650.0000 mg | ORAL_TABLET | Freq: Once | ORAL | Status: AC
  Administered 2012-02-01: 650 mg via ORAL
  Filled 2012-02-01: qty 2

## 2012-02-01 MED ORDER — IPRATROPIUM BROMIDE 0.02 % IN SOLN
0.5000 mg | Freq: Four times a day (QID) | RESPIRATORY_TRACT | Status: DC
  Administered 2012-02-02 – 2012-02-05 (×14): 0.5 mg via RESPIRATORY_TRACT
  Filled 2012-02-01 (×14): qty 2.5

## 2012-02-01 MED ORDER — AZITHROMYCIN 250 MG PO TABS
250.0000 mg | ORAL_TABLET | Freq: Every day | ORAL | Status: AC
  Administered 2012-02-02 – 2012-02-05 (×4): 250 mg via ORAL
  Filled 2012-02-01 (×4): qty 1

## 2012-02-01 MED ORDER — ALBUTEROL (5 MG/ML) CONTINUOUS INHALATION SOLN: 10.0000 mg/h | INHALATION_SOLUTION | RESPIRATORY_TRACT | Status: DC

## 2012-02-01 MED ORDER — ALBUTEROL SULFATE (5 MG/ML) 0.5% IN NEBU: 2.5000 mg | INHALATION_SOLUTION | RESPIRATORY_TRACT | Status: DC | PRN

## 2012-02-01 MED ORDER — DEXTROSE 5 % IV SOLN
1.0000 g | Freq: Once | INTRAVENOUS | Status: AC
  Administered 2012-02-01: 1 g via INTRAVENOUS
  Filled 2012-02-01: qty 10

## 2012-02-01 NOTE — ED Notes (Signed)
Per EMS: pt from home c/o altered LOC starting last night when son saw her "crawling around on floor in living room"; pt was c/o dizziness today and generalized weakness and SOB; pt found tripoding speaking short phrases with wheezing noted; pt given 5mg  albuterol in route; IV L FA 20g

## 2012-02-01 NOTE — ED Notes (Signed)
Patient transported to X-ray 

## 2012-02-01 NOTE — H&P (Signed)
PCP:   Dayna Barker, MD   She states Dr. Andrey Campanile at [facility]   Chief Complaint:  Confusion, diarrhea  HPI: 65yoF with h/o diabetes on insulin, pain syndrome, daily tobacco abuse presents with diarrhea,  fevers, cough, confusion and found on CXR to have bronchitis/cannot r/o PNA.   Pt is quite reliable historian, despite coming into ED with confusion. She states that starting  Friday night she developed non-painful diarrhea with some nausea, but no vomiting, and minimal  crampy diffuse abdominal pain. She started feeling confused, noticing this herself, that she  "couldn't remember who she was" and had difficulty getting out of bed. ED notes indicate her son  found her on the ground crawling around. EMS called and on arrival, EMS noted her to be very  wheezy. She also endorses a dry cough for the past couple days as well, and subjective fevers. Of  note, she smokes a pack a day.   In the ED pt was febrile up to 102.4, tachy up to 138, BP preserved, lowest O2 90%. She was not  noted to be confused, but was persistently wheezy. Labs with normal chem panel except hyperglycemi  197. Normal LFT's. WBC 12.2 with normal diff, rest of CBC stable. CT head nothing acute. CXR with  bronchitis, poor inspiration so difficult to r/o PNA. ABG with O2 on the low side 64 but CO2, HCO3  are OK. Pt has been given CTX/azithromycin, prednisone 60 mg, tylenol, and many nebs.   Pt denies any dizziness, lightheadedness, chills, rigors, chest pain, dysuria. She has left leg  rash that is longstanding and her PCP is aware of this. She is not feeling confused now and feels  much better.    Past Medical History  Diagnosis Date  . Cervicalgia   . Lumbago   . Pain in joint, hand   . Disturbance of skin sensation   . Polyneuropathy in diabetes   . Olecranon bursitis   . Chronic pain syndrome   . Diabetes mellitus   . Hypertension   . Hyperlipidemia   . CHF (congestive heart failure)   . Asthma     . COPD (chronic obstructive pulmonary disease)   . Blood transfusion     Past Surgical History  Procedure Date  . Back surgery     from bacteria    Medications:  HOME MEDS:  Reconciled but not very well  Prior to Admission medications   Medication Sig Start Date End Date Taking? Authorizing Provider  citalopram (CELEXA) 40 MG tablet Take 40 mg by mouth daily.   Yes Historical Provider, MD  clonazePAM (KLONOPIN) 1 MG tablet Take 1 mg by mouth 3 (three) times daily as needed.   Yes Historical Provider, MD  gabapentin (NEURONTIN) 400 MG capsule Take 400 mg by mouth every 4 (four) hours.   Yes Historical Provider, MD  insulin glargine (LANTUS) 100 UNIT/ML injection Inject 0-55 Units into the skin 3 (three) times daily. Per sliding scale   Yes Historical Provider, MD  metFORMIN (GLUCOPHAGE) 500 MG tablet Take 500 mg by mouth 2 (two) times daily with a meal.   Yes Historical Provider, MD  montelukast (SINGULAIR) 10 MG tablet Take 10 mg by mouth daily.   Yes Historical Provider, MD  oxyCODONE-acetaminophen (PERCOCET) 10-325 MG per tablet Take 1 tablet by mouth 4 (four) times daily.   Yes Historical Provider, MD  promethazine (PHENERGAN) 25 MG tablet Take 12.5-25 mg by mouth 3 (three) times daily as needed. For nausea/vomiting   Yes  Historical Provider, MD  ranitidine (ZANTAC) 150 MG capsule Take 150 mg by mouth 2 (two) times daily.   Yes Historical Provider, MD  zolpidem (AMBIEN) 10 MG tablet Take 10 mg by mouth 3 (three) times daily.   Yes Historical Provider, MD    Allergies:  No Known Allergies  Social History:   reports that she has been smoking Cigarettes.  She has a 30 pack-year smoking history. She has never used smokeless tobacco. She reports that she does not drink alcohol or use illicit drugs.  Family History: History reviewed. No pertinent family history.  Physical Exam: Filed Vitals:   02/01/12 2035 02/01/12 2115 02/01/12 2200 02/01/12 2215  BP:  135/62 134/55   Pulse:   111 138   Temp:    99.2 F (37.3 C)  TempSrc:    Oral  Resp:  23 25   SpO2: 91% 90% 93%    Blood pressure 134/55, pulse 138, temperature 99.2 F (37.3 C), temperature source Oral, resp. rate 25, SpO2 93.00%. Gen: Middle aged appearing F in no distress, but is diaphoretic and a bit ill appearing. Able to  relate history well, is pleasant and nice. No increased WOB or accessory muscle use.  HEENT: Pupils round, reactive, normal appearing, EOMI, sclera clear and normal. Mouth moist. [place]  in.  Lungs: Diffuse expiratory wheezes and coarse inspiratory breath sounds. Good air movement though.  Heart: regular and tachycardic but no m/g ABd: Obese but soft non tender, non distended, no facial grimacing, very benign Extrem: Warm, perfusing well, radials palpable, no BLE edema noted.  Neuro: Alert, attentive, CN 2-12 intact, no slurred speech, no facial droop, moves extremities on  her own. Needs minimal assistance to sit up in bed. Oriented to person, [facility], situation, year.  Mental status is clear.    Labs & Imaging Results for orders placed during the hospital encounter of 02/01/12 (from the past 48 hour(s))  CBC     Status: Abnormal   Collection Time   02/01/12  7:30 PM      Component Value Range Comment   WBC 12.2 (*) 4.0 - 10.5 (K/uL)    RBC 5.21 (*) 3.87 - 5.11 (MIL/uL)    Hemoglobin 12.6  12.0 - 15.0 (g/dL)    HCT 21.3  08.6 - 57.8 (%)    MCV 75.0 (*) 78.0 - 100.0 (fL)    MCH 24.2 (*) 26.0 - 34.0 (pg)    MCHC 32.2  30.0 - 36.0 (g/dL)    RDW 46.9  62.9 - 52.8 (%)    Platelets 229  150 - 400 (K/uL)   DIFFERENTIAL     Status: Abnormal   Collection Time   02/01/12  7:30 PM      Component Value Range Comment   Neutrophils Relative 68  43 - 77 (%)    Neutro Abs 8.3 (*) 1.7 - 7.7 (K/uL)    Lymphocytes Relative 26  12 - 46 (%)    Lymphs Abs 3.1  0.7 - 4.0 (K/uL)    Monocytes Relative 6  3 - 12 (%)    Monocytes Absolute 0.7  0.1 - 1.0 (K/uL)    Eosinophils Relative 1  0 - 5 (%)     Eosinophils Absolute 0.1  0.0 - 0.7 (K/uL)    Basophils Relative 0  0 - 1 (%)    Basophils Absolute 0.0  0.0 - 0.1 (K/uL)   COMPREHENSIVE METABOLIC PANEL     Status: Abnormal   Collection Time  02/01/12  7:30 PM      Component Value Range Comment   Sodium 137  135 - 145 (mEq/L)    Potassium 3.8  3.5 - 5.1 (mEq/L)    Chloride 99  96 - 112 (mEq/L)    CO2 26  19 - 32 (mEq/L)    Glucose, Bld 197 (*) 70 - 99 (mg/dL)    BUN 6  6 - 23 (mg/dL)    Creatinine, Ser 4.54  0.50 - 1.10 (mg/dL)    Calcium 9.7  8.4 - 10.5 (mg/dL)    Total Protein 6.6  6.0 - 8.3 (g/dL)    Albumin 3.4 (*) 3.5 - 5.2 (g/dL)    AST 18  0 - 37 (U/L)    ALT 10  0 - 35 (U/L)    Alkaline Phosphatase 95  39 - 117 (U/L)    Total Bilirubin 0.2 (*) 0.3 - 1.2 (mg/dL)    GFR calc non Af Amer 62 (*) >90 (mL/min)    GFR calc Af Amer 72 (*) >90 (mL/min)   ETHANOL     Status: Normal   Collection Time   02/01/12  7:30 PM      Component Value Range Comment   Alcohol, Ethyl (B) <11  0 - 11 (mg/dL)   POCT I-STAT 3, BLOOD GAS (G3+)     Status: Abnormal   Collection Time   02/01/12  8:10 PM      Component Value Range Comment   pH, Arterial 7.419 (*) 7.350 - 7.400     pCO2 arterial 40.5  35.0 - 45.0 (mmHg)    pO2, Arterial 64.0 (*) 80.0 - 100.0 (mmHg)    Bicarbonate 25.7 (*) 20.0 - 24.0 (mEq/L)    TCO2 27  0 - 100 (mmol/L)    O2 Saturation 90.0      Acid-Base Excess 2.0  0.0 - 2.0 (mmol/L)    Patient temperature 102.4 F      Collection site RADIAL, ALLEN'S TEST ACCEPTABLE      Drawn by RT      Sample type ARTERIAL      Dg Chest 2 View  02/01/2012  *RADIOLOGY REPORT*  Clinical Data: Wheezing.  Fever.  Cough.  Congestion.  CHEST - 2 VIEW  Comparison: 04/05/2011  Findings: Heart size is normal.  Mediastinal shadows are normal. There may be bronchial thickening.  The patient has taken a poor inspiration.  There are increased markings at the bases probably related to poor inspiration.  Difficult to completely exclude mild basilar pneumonia  given that poor inspiration.  No effusions.  No significant bony finding.  IMPRESSION: Bronchitis.  Poor inspiration.  Crowded markings at the lung bases probably related to that.  Given the poor inspiration, difficult to completely rule out mild basilar pneumonia.  Original Report Authenticated By: Thomasenia Sales, M.D.   Ct Head Wo Contrast  02/01/2012  *RADIOLOGY REPORT*  Clinical Data: Dizziness.  Altered mental status.  CT HEAD WITHOUT CONTRAST  Technique:  Contiguous axial images were obtained from the base of the skull through the vertex without contrast.  Comparison: 06/13/2010.  Findings: No significant change in minimally enlarged ventricles and subarachnoid spaces and minimal patchy white matter low density in both frontal lobes.  No intracranial hemorrhage, mass lesion or CT evidence of acute infarction.  Unremarkable bones and included paranasal sinuses.  IMPRESSION: No significant change in minimal atrophy and minimal chronic small vessel white matter ischemic changes in both frontal lobes.  No acute abnormality.  Original Report Authenticated By: Darrol Angel, M.D.    ECG: NSR 122 bpm, LAD with LAFB, small ditzel R waves inferiorly but don't appear to be Q waves.  Narrow QRS, no ST deviations. Moderately diffuse TWF. Poor RW anterior leads.   Impression Present on Admission:  .Hypoxia .Wheezing .Fever .Leukocytosis .Bronchitis .Confusion .Diarrhea .COPD (chronic obstructive pulmonary disease) .Diabetes mellitus  65yoF with h/o diabetes on insulin, pain syndrome, daily tobacco abuse presents with diarrhea,  fevers, cough, confusion and found on CXR to have bronchitis/cannot r/o PNA.   1. Minimal hypoxia, wheezing, febrile, leukocytosis, bronchitis on CXR: She is likely infected  with pulmonary source. Cannot rule out PNA on CXR but this seems reasonable to consider  treat  for. She is quite wheezy and given daily PPD smoking, she likely has undiagnosed COPD, and this is    reasonable to treat as well, however will drop her from Pred 60 to 40 given her concurrent  DM/hyperglycemia.   - Keep on O2, nebulizers, treat for CAP with ceftriaxone/azithromycin, influenza swab, legionella  urinary Ag given concurrent diarrhea, sputum culture. Prednisone 40 mg daily, and if getting  better quickly would taper her quickly. Tobacco cessastion counseling. Mainetenance IVF's. Get UA  to complete w/u   2. Confusion: This is likely due an infected state, CT head negative, and pt is quite mentally  lucid right now.   3. Diarrhea: For the past 1-2 days. Could possibly be norovirus as this is in the community now,  or non-specific symptom of pulmonary issue. Abdomen seems benign, no pain and given the very short  duration so far, do not feel overwhelmed to send stool cultures or chase this down extensively at  this point, but would just monitor for now.   4. Diabetes: Currently minimally hyperglycemic. Will give SSI, hold metformin.   5. Needs medication reconciliation formally. For example home meds list shows Lantus TID with  meals, and ambien TID. Will hold all her home meds for now except celexa as none are essential.   Telemetry, MC team 2 Presumed full code   Other plans as per orders.  Traxton Kolenda 02/01/2012, 10:58 PM

## 2012-02-01 NOTE — ED Provider Notes (Addendum)
History     CSN: [id number]  Arrival date & time 02/01/12  1825   First MD Initiated Contact with Patient 02/01/12 1834      Chief Complaint  Patient presents with  . Altered Mental Status  . Weakness  . Shortness of Breath    (Consider location/radiation/quality/duration/timing/severity/associated sxs/prior treatment) HPI Comments: Patient's son found her crawling around on the living room floor not acting herself. When EMS arrived patient was speaking in short phrases with notable wheezing. On arrival here patient is alone but she is not confused and is speaking normally. She complains of generalized weakness but has no other complaints at this time. She denies pain  Patient is a 66 y.o. female presenting with altered mental status. The history is provided by the patient.  Altered Mental Status This is a new problem. The current episode started yesterday. The problem occurs constantly. The problem has been resolved. Associated symptoms include shortness of breath. Pertinent negatives include no chest pain and no abdominal pain. Associated symptoms comments: Cough, shortness of breath, confusion and generalized weakness all starting yesterday. The symptoms are aggravated by nothing. The symptoms are relieved by nothing. She has tried nothing for the symptoms. The treatment provided no relief.    Past Medical History  Diagnosis Date  . Cervicalgia   . Lumbago   . Pain in joint, hand   . Disturbance of skin sensation   . Polyneuropathy in diabetes   . Olecranon bursitis   . Chronic pain syndrome     History reviewed. No pertinent past surgical history.  History reviewed. No pertinent family history.  History  Substance Use Topics  . Smoking status: Not on file  . Smokeless tobacco: Not on file  . Alcohol Use: Not on file    OB History    Grav Para Term Preterm Abortions TAB SAB Ect Mult Living                  Review of Systems  Constitutional: Positive for fever.    Respiratory: Positive for cough, shortness of breath and wheezing.   Cardiovascular: Negative for chest pain.  Gastrointestinal: Negative for abdominal pain.  Psychiatric/Behavioral: Positive for altered mental status.  All other systems reviewed and are negative.    Allergies  Review of patient's allergies indicates no known allergies.  Home Medications   Current Outpatient Rx  Name Route Sig Dispense Refill  . CITALOPRAM HYDROBROMIDE 40 MG PO TABS Oral Take 40 mg by mouth daily.    Marland Kitchen CLONAZEPAM 1 MG PO TABS Oral Take 1 mg by mouth 3 (three) times daily as needed.    Marland Kitchen GABAPENTIN 400 MG PO CAPS  TAKE ONE CAPSULE EVERY 4 HOURS 180 capsule 2  . INSULIN GLARGINE 100 UNIT/ML [place] SOLN Subcutaneous Inject into the skin at bedtime.    Marland Kitchen METFORMIN HCL 500 MG PO TABS Oral Take 500 mg by mouth 2 (two) times daily with a meal.    . MONTELUKAST SODIUM 10 MG PO TABS Oral Take 10 mg by mouth daily.    . OXYCODONE-ACETAMINOPHEN 10-325 MG PO TABS Oral Take 1 tablet by mouth 4 (four) times daily.    Marland Kitchen PROMETHAZINE HCL 25 MG PO TABS Oral Take 12.5-25 mg by mouth 3 (three) times daily as needed.    Marland Kitchen RANITIDINE HCL 150 MG PO CAPS Oral Take 150 mg by mouth 2 (two) times daily.    Marland Kitchen ZOLPIDEM TARTRATE 10 MG PO TABS Oral Take 10 mg by mouth  3 (three) times daily.      BP 137/69  Pulse 123  Temp(Src) 101.7 F (38.7 C) (Oral)  Resp 22  SpO2 99%  Physical Exam  Constitutional: She is oriented to person, place, and time. She appears well-developed and well-nourished. No distress.  HENT:  Head: Normocephalic and atraumatic.  Right Ear: External ear normal.  Left Ear: External ear normal.  Mouth/Throat: Oropharynx is clear and moist.  Eyes: Conjunctivae and EOM are normal. Pupils are equal, round, and reactive to light. Right eye exhibits no discharge.  Neck: Normal range of motion. Neck supple.  Cardiovascular: Regular rhythm, normal heart sounds and intact distal pulses.  Tachycardia present.   No  murmur heard. Pulmonary/Chest: Tachypnea noted. No respiratory distress. She has no decreased breath sounds. She has wheezes in the right lower field, the left upper field, the left middle field and the left lower field. She has no rhonchi. She has no rales.  Abdominal: Soft. There is no tenderness.  Musculoskeletal: Normal range of motion. She exhibits no edema and no tenderness.  Neurological: She is alert and oriented to person, place, and time.  Skin: Skin is warm and dry. No rash noted.  Psychiatric: She has a normal mood and affect.    ED Course  Procedures (including critical care time)  Labs Reviewed  CBC - Abnormal; Notable for the following:    WBC 12.2 (*)    RBC 5.21 (*)    MCV 75.0 (*)    MCH 24.2 (*)    All other components within normal limits  DIFFERENTIAL - Abnormal; Notable for the following:    Neutro Abs 8.3 (*)    All other components within normal limits  COMPREHENSIVE METABOLIC PANEL - Abnormal; Notable for the following:    Glucose, Bld 197 (*)    Albumin 3.4 (*)    Total Bilirubin 0.2 (*)    GFR calc non Af Amer 62 (*)    GFR calc Af Amer 72 (*)    All other components within normal limits  POCT I-STAT 3, BLOOD GAS (G3+) - Abnormal; Notable for the following:    pH, Arterial 7.419 (*)    pO2, Arterial 64.0 (*)    Bicarbonate 25.7 (*)    All other components within normal limits  ETHANOL  URINALYSIS, ROUTINE W REFLEX MICROSCOPIC  URINE RAPID DRUG SCREEN (HOSP PERFORMED)  BLOOD GAS, ARTERIAL   Dg Chest 2 View  02/01/2012  *RADIOLOGY REPORT*  Clinical Data: Wheezing.  Fever.  Cough.  Congestion.  CHEST - 2 VIEW  Comparison: 04/05/2011  Findings: Heart size is normal.  Mediastinal shadows are normal. There may be bronchial thickening.  The patient has taken a poor inspiration.  There are increased markings at the bases probably related to poor inspiration.  Difficult to completely exclude mild basilar pneumonia given that poor inspiration.  No effusions.   No significant bony finding.  IMPRESSION: Bronchitis.  Poor inspiration.  Crowded markings at the lung bases probably related to that.  Given the poor inspiration, difficult to completely rule out mild basilar pneumonia.  Original Report Authenticated By: Thomasenia Sales, M.D.     Date: 02/01/2012  Rate: 122  Rhythm: sinus tachycardia  QRS Axis: normal  Intervals: normal  ST/T Wave abnormalities: nonspecific ST/T changes  Conduction Disutrbances:none  Narrative Interpretation:   Old EKG Reviewed: unchanged   1. CAP (community acquired pneumonia)   2. COPD (chronic obstructive pulmonary disease)       MDM  Patient's complaints of confusion yesterday and today. She states this morning if she could not get out of bed and she was confused. She states her arms and legs were working but she couldn't figure or get out of bed. She states she was vomiting and having diarrhea. Yesterday she also was not feeling well and states she developed a cough yesterday. Patient's son found her crawling around on the floor in the living room and when EMS arrived they patient was tripoding and speaking in short phrases with wheezing. On arrival here patient had received albuterol with persistent wheezing but no acute distress. She had temperature of 101.7 on exam and was tachycardic to 123. The patient's confusion is most likely related to an underlying infection. Pulmonary versus urine infection is most likely. CBC, CMP, UA, UDS, EtOH, chest x-ray, head CT, ABG pending. Patient given prednisone for the wheezing.  8:19 PM Chest x-ray poor inspiration but possibly an early infiltrate. Repeat temperature is 102. Patient with persistent wheezing and given Xopenex and Atrovent. CBC with leukocytosis of 12,000 but normal ABG. Will cover for pneumonia and admitted for further care.     Gwyneth Sprout, MD 02/01/12 2050  Gwyneth Sprout, MD 02/01/12 2051

## 2012-02-02 LAB — BASIC METABOLIC PANEL
BUN: 9 mg/dL (ref 6–23)
CO2: 22 mEq/L (ref 19–32)
Calcium: 9.1 mg/dL (ref 8.4–10.5)
Chloride: 96 mEq/L (ref 96–112)
Creatinine, Ser: 0.99 mg/dL (ref 0.50–1.10)
GFR calc Af Amer: 67 mL/min — ABNORMAL LOW (ref >90)
GFR calc non Af Amer: 58 mL/min — ABNORMAL LOW (ref >90)
Glucose, Bld: 488 mg/dL — ABNORMAL HIGH (ref 70–99)
Potassium: 3.9 mEq/L (ref 3.5–5.1)
Sodium: 132 mEq/L — ABNORMAL LOW (ref 135–145)

## 2012-02-02 LAB — CBC
HCT: 36.6 % (ref 36.0–46.0)
Hemoglobin: 11.8 g/dL — ABNORMAL LOW (ref 12.0–15.0)
MCH: 24.2 pg — ABNORMAL LOW (ref 26.0–34.0)
MCHC: 32.2 g/dL (ref 30.0–36.0)
MCV: 75 fL — ABNORMAL LOW (ref 78.0–100.0)
Platelets: 212 10*3/uL (ref 150–400)
RBC: 4.88 MIL/uL (ref 3.87–5.11)
RDW: 14.2 % (ref 11.5–15.5)
WBC: 10 10*3/uL (ref 4.0–10.5)

## 2012-02-02 LAB — GLUCOSE, CAPILLARY
Glucose-Capillary: 531 mg/dL — ABNORMAL HIGH (ref 70–99)
Glucose-Capillary: 535 mg/dL — ABNORMAL HIGH (ref 70–99)
Glucose-Capillary: 489 mg/dL — ABNORMAL HIGH (ref 70–99)
Glucose-Capillary: 385 mg/dL — ABNORMAL HIGH (ref 70–99)
Glucose-Capillary: 182 mg/dL — ABNORMAL HIGH (ref 70–99)

## 2012-02-02 LAB — INFLUENZA PANEL BY PCR (TYPE A & B)
H1N1 flu by pcr: NOT DETECTED
Influenza A By PCR: NEGATIVE
Influenza B By PCR: NEGATIVE

## 2012-02-02 MED ORDER — CLONAZEPAM 0.5 MG PO TABS
0.5000 mg | ORAL_TABLET | Freq: Three times a day (TID) | ORAL | Status: DC | PRN
  Administered 2012-02-02 – 2012-02-04 (×5): 0.5 mg via ORAL
  Filled 2012-02-02 (×5): qty 1

## 2012-02-02 MED ORDER — CITALOPRAM HYDROBROMIDE 20 MG PO TABS
20.0000 mg | ORAL_TABLET | Freq: Every day | ORAL | Status: DC
  Administered 2012-02-03 – 2012-02-05 (×3): 20 mg via ORAL
  Filled 2012-02-02 (×3): qty 1

## 2012-02-02 MED ORDER — INSULIN ASPART 100 UNIT/ML ~~LOC~~ SOLN
5.0000 [IU] | Freq: Once | SUBCUTANEOUS | Status: AC
  Administered 2012-02-02: 5 [IU] via SUBCUTANEOUS

## 2012-02-02 MED ORDER — INSULIN ASPART 100 UNIT/ML ~~LOC~~ SOLN
18.0000 [IU] | Freq: Once | SUBCUTANEOUS | Status: AC
  Administered 2012-02-02: 18 [IU] via SUBCUTANEOUS

## 2012-02-02 MED ORDER — INSULIN GLARGINE 100 UNIT/ML ~~LOC~~ SOLN
10.0000 [IU] | Freq: Once | SUBCUTANEOUS | Status: AC
  Administered 2012-02-02: 10 [IU] via SUBCUTANEOUS

## 2012-02-02 MED ORDER — INSULIN ASPART 100 UNIT/ML ~~LOC~~ SOLN
0.0000 [IU] | Freq: Three times a day (TID) | SUBCUTANEOUS | Status: DC
  Administered 2012-02-02 (×2): 15 [IU] via SUBCUTANEOUS
  Administered 2012-02-03 (×2): 3 [IU] via SUBCUTANEOUS
  Administered 2012-02-03: 2 [IU] via SUBCUTANEOUS
  Filled 2012-02-02: qty 3

## 2012-02-02 MED ORDER — INSULIN ASPART 100 UNIT/ML ~~LOC~~ SOLN: 0.0000 [IU] | Freq: Every day | SUBCUTANEOUS | Status: DC

## 2012-02-02 MED ORDER — INSULIN ASPART 100 UNIT/ML ~~LOC~~ SOLN
10.0000 [IU] | Freq: Three times a day (TID) | SUBCUTANEOUS | Status: DC
  Administered 2012-02-02 – 2012-02-04 (×6): 10 [IU] via SUBCUTANEOUS
  Filled 2012-02-02: qty 3

## 2012-02-02 MED ORDER — INSULIN GLARGINE 100 UNIT/ML ~~LOC~~ SOLN
20.0000 [IU] | Freq: Every day | SUBCUTANEOUS | Status: DC
  Administered 2012-02-02 – 2012-02-04 (×3): 20 [IU] via SUBCUTANEOUS
  Filled 2012-02-02: qty 3

## 2012-02-02 NOTE — Progress Notes (Signed)
Subjective: Very SOB Fell at home Still smoking   Objective: Vital signs in last 24 hours: Filed Vitals:   02/02/12 0500 02/02/12 0811 02/02/12 1002 02/02/12 1100  BP:   138/62   Pulse:   97   Temp:   98.9 F (37.2 C)   TempSrc:   Oral   Resp:   16   Height:      Weight: 112.3 kg (247 lb 9.2 oz)     SpO2:  93% 99% 88%   Weight change:   Intake/Output Summary (Last 24 hours) at 02/02/12 1121 Last data filed at 02/02/12 0900  Gross per 24 hour  Intake 909.67 ml  Output      0 ml  Net 909.67 ml    Physical Exam: General: Awake, Oriented, No acute distress. HEENT: EOMI. Neck: Supple CV: S1 and S2 Lungs: B/L wheezing Abdomen: Soft, Nontender, Nondistended, +bowel sounds. Ext: Good pulses. Trace edema.   Lab Results:  [facility] 02/02/12 0034 02/01/12 1930  NA 132* 137  K 3.9 3.8  CL 96 99  CO2 22 26  GLUCOSE 488* 197*  BUN 9 6  CREATININE 0.99 0.93  CALCIUM 9.1 9.7  MG -- --  PHOS -- --    Basename 02/01/12 1930  AST 18  ALT 10  ALKPHOS 95  BILITOT 0.2*  PROT 6.6  ALBUMIN 3.4*   No results found for this basename: LIPASE:2,AMYLASE:2 in the last 72 hours  Basename 02/02/12 0034 02/01/12 1930  WBC 10.0 12.2*  NEUTROABS -- 8.3*  HGB 11.8* 12.6  HCT 36.6 39.1  MCV 75.0* 75.0*  PLT 212 229   No results found for this basename: CKTOTAL:3,CKMB:3,CKMBINDEX:3,TROPONINI:3 in the last 72 hours No components found with this basename: POCBNP:3 No results found for this basename: DDIMER:2 in the last 72 hours No results found for this basename: HGBA1C:2 in the last 72 hours No results found for this basename: CHOL:2,HDL:2,LDLCALC:2,TRIG:2,CHOLHDL:2,LDLDIRECT:2 in the last 72 hours No results found for this basename: TSH,T4TOTAL,FREET3,T3FREE,THYROIDAB in the last 72 hours No results found for this basename: VITAMINB12:2,FOLATE:2,FERRITIN:2,TIBC:2,IRON:2,RETICCTPCT:2 in the last 72 hours  Micro Results: No results found for this or any previous visit (from  the past 240 hour(s)).  Studies/Results: Dg Chest 2 View  02/01/2012  *RADIOLOGY REPORT*  Clinical Data: Wheezing.  Fever.  Cough.  Congestion.  CHEST - 2 VIEW  Comparison: 04/05/2011  Findings: Heart size is normal.  Mediastinal shadows are normal. There may be bronchial thickening.  The patient has taken a poor inspiration.  There are increased markings at the bases probably related to poor inspiration.  Difficult to completely exclude mild basilar pneumonia given that poor inspiration.  No effusions.  No significant bony finding.  IMPRESSION: Bronchitis.  Poor inspiration.  Crowded markings at the lung bases probably related to that.  Given the poor inspiration, difficult to completely rule out mild basilar pneumonia.  Original Report Authenticated By: Thomasenia Sales, M.D.   Ct Head Wo Contrast  02/01/2012  *RADIOLOGY REPORT*  Clinical Data: Dizziness.  Altered mental status.  CT HEAD WITHOUT CONTRAST  Technique:  Contiguous axial images were obtained from the base of the skull through the vertex without contrast.  Comparison: 06/13/2010.  Findings: No significant change in minimally enlarged ventricles and subarachnoid spaces and minimal patchy white matter low density in both frontal lobes.  No intracranial hemorrhage, mass lesion or CT evidence of acute infarction.  Unremarkable bones and included paranasal sinuses.  IMPRESSION: No significant change in minimal atrophy and minimal  chronic small vessel white matter ischemic changes in both frontal lobes.  No acute abnormality.  Original Report Authenticated By: Darrol Angel, M.D.    Medications: I have reviewed the patient's current medications. Scheduled Meds:   . acetaminophen  650 mg Oral Once  . albuterol  2.5 mg Nebulization Q6H  . albuterol      . antiseptic oral rinse  15 mL Mouth Rinse BID  . azithromycin  500 mg Intravenous Once  . azithromycin  250 mg Oral Daily  . cefTRIAXone (ROCEPHIN)  IV  1 g Intravenous Once  . cefTRIAXone  (ROCEPHIN)  IV  1 g Intravenous QHS  . citalopram  40 mg Oral Daily  . heparin  5,000 Units Subcutaneous Q8H  . ibuprofen  400 mg Oral Once  . influenza  inactive virus vaccine  0.5 mL Intramuscular Tomorrow-1000  . insulin aspart  0-15 Units Subcutaneous TID WC  . insulin aspart  0-5 Units Subcutaneous QHS  . insulin aspart  18 Units Subcutaneous Once  . insulin glargine  10 Units Subcutaneous Once  . insulin glargine  20 Units Subcutaneous QHS  . ipratropium  0.5 mg Nebulization Once  . ipratropium  0.5 mg Nebulization Q6H  . levalbuterol  1.25 mg Nebulization To Major  . predniSONE  40 mg Oral Q breakfast  . predniSONE  60 mg Oral Once  . sodium chloride  3 mL Intravenous Q12H  . DISCONTD: levalbuterol  1.25 mg Nebulization Once  . DISCONTD: levalbuterol  1.25 mg Nebulization To Major   Continuous Infusions:   . sodium chloride 100 mL/hr at 02/02/12 0019  . DISCONTD: albuterol     PRN Meds:.acetaminophen, acetaminophen, albuterol, clonazePAM, HYDROcodone-acetaminophen, ondansetron (ZOFRAN) IV, ondansetron  Assessment/Plan: Principal Problem:  *CAP (community acquired pneumonia)- abx, nebs, steroids   Hypoxia- secondary to COPD/tobacco abuse/PNA   Fever- resolved   Leukocytosis- resolved   Confusion- resolved   Diarrhea- resolved before admission   Diabetes mellitus- lantus, SSI, on steroids   COPD (chronic obstructive pulmonary disease)  Tobacco abuse- encourage cessation  Hyponatremia- watch, lung related?   LOS: 1 day  Ethan Kasperski, DO 02/02/2012, 11:21 AM

## 2012-02-02 NOTE — Progress Notes (Signed)
Patient arrived to unit from ed via stretcher. Patient able to ambulate to bed with minimal assistance and is alert, oriented with vital signs wnl (see doc flowsheets).  Bed in low position, bed alarm in use, safety video viewed and call light within reach. Will continue to monitor.   awalker,rn

## 2012-02-02 NOTE — Progress Notes (Signed)
Physical Therapy Evaluation Patient Details Name: Yvonne [name] MRN: [id number] DOB: Oct 12, 1946 Today's Date: 02/02/2012  Problem List:  Patient Active Problem List  Diagnoses  . Hypoxia  . Fever  . Leukocytosis  . Bronchitis  . Confusion  . Diarrhea  . Diabetes mellitus  . COPD (chronic obstructive pulmonary disease)  . CAP (community acquired pneumonia)    Past Medical History:  Past Medical History  Diagnosis Date  . Cervicalgia   . Lumbago   . Pain in joint, hand   . Disturbance of skin sensation   . Polyneuropathy in diabetes   . Olecranon bursitis   . Chronic pain syndrome   . Diabetes mellitus   . Hypertension   . Hyperlipidemia   . CHF (congestive heart failure)   . Asthma   . COPD (chronic obstructive pulmonary disease)   . Blood transfusion    Past Surgical History:  Past Surgical History  Procedure Date  . Back surgery     from bacteria    PT Assessment/Plan/Recommendation PT Assessment Clinical Impression Statement: 66 yo female admitted with Pneumonia presents with decreased activity tolerance; will benefit from PT To maximize independence and safety with functional mobility and enable safe dc home;  Of note, pt was completely independent prior to admission, and it is likely that she will progress quickly as her medical condition improves PT Recommendation/Assessment: Patient will need skilled PT in the acute care venue PT Problem List: Decreased strength;Decreased activity tolerance;Decreased balance;Decreased mobility;Decreased knowledge of use of DME;Decreased safety awareness PT Therapy Diagnosis : Difficulty walking PT Plan PT Frequency: Min 3X/week PT Treatment/Interventions: DME instruction;Gait training;Stair training;Functional mobility training;Therapeutic activities;Therapeutic exercise;Patient/family education PT Recommendation Recommendations for Other Services: OT consult Follow Up Recommendations: Home health PT;Supervision -  Intermittent Equipment Recommended: Rolling walker with 5" wheels;3 in 1 bedside comode PT Goals  Acute Rehab PT Goals PT Goal Formulation: With patient Time For Goal Achievement: 2 weeks Pt will go Supine/Side to Sit: with modified independence PT Goal: Supine/Side to Sit - Progress: Goal set today Pt will go Sit to Supine/Side: with modified independence PT Goal: Sit to Supine/Side - Progress: Goal set today Pt will go Sit to Stand: with modified independence PT Goal: Sit to Stand - Progress: Goal set today Pt will go Stand to Sit: with modified independence PT Goal: Stand to Sit - Progress: Goal set today Pt will Ambulate: >150 feet;with modified independence;with least restrictive assistive device PT Goal: Ambulate - Progress: Goal set today Pt will Go Up / Down Stairs: 1-2 stairs;with modified independence;with least restrictive assistive device PT Goal: Up/Down Stairs - Progress: Goal set today Pt will Perform Home Exercise Program: Independently PT Goal: Perform Home Exercise Program - Progress: Goal set today  PT Evaluation Precautions/Restrictions  Precautions Precautions: Fall Precaution Comments: Desats on Room Air Prior Functioning  Home Living 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: Family Type of Home: Apartment Home Layout: One level Home Access: Stairs to enter Entrance Stairs-Rails: None Entrance Stairs-Number of Steps: 2 (one at walkway, one to get in door) Home Adaptive Equipment: None Prior Function Level of Independence: Independent with homemaking with ambulation Able to Take Stairs?: Yes Comments: PTA, pt was completley Independent and able to walk to neighborhood grocery store and carry bags home; reports difficulty rising from her chair at home, and requests a lift chair (this therapist considers a lift chair not optimal -- will request HHPT to see about working best with what she has). Cognition Cognition Arousal/Alertness:  Awake/alert Overall Cognitive Status: Appears within functional limits for tasks assessed Sensation/Coordination Sensation Light Touch: Appears Intact Coordination Gross Motor Movements are Fluid and Coordinated: Yes (though somewhat limited by body habitus) Fine Motor Movements are Fluid and Coordinated: Not tested Extremity Assessment RUE Assessment RUE Assessment: Within Functional Limits LUE Assessment LUE Assessment: Within Functional Limits RLE Assessment RLE Assessment: Exceptions to [facility] RLE Strength RLE Overall Strength Comments: Generalized weakness, requiring physical assist for sit to stand LLE Assessment LLE Assessment: Exceptions to [facility] LLE Strength LLE Overall Strength Comments: Generalized weakness, requiring physical assist for sit to stand Mobility (including Balance) Bed Mobility Bed Mobility: Yes Supine to Sit: 4: Min assist;With rails;HOB elevated (Comment degrees) (HOB 30deg) Supine to Sit Details (indicate cue type and reason): cues to initiate and handheld assist to reach fully sitting position Transfers Transfers: Yes Sit to Stand: 3: Mod assist;From bed Sit to Stand Details (indicate cue type and reason): physical assist/support given at Left elbow; cues to completely scoot to EOB and initiate with forward lean Stand to Sit: 4: Min assist;To chair/3-in-1;With upper extremity assist;With armrests Stand to Sit Details: cues to control stand to sit Ambulation/Gait Ambulation/Gait: Yes Ambulation/Gait Assistance: 4: Min assist Ambulation/Gait Assistance Details (indicate cue type and reason): Overall limited by decr activity tolerance; gave pt choice to amb to sink (from bed 2) or simply get to chair, and pt chose chair -- reported a fear of falling; short steps; increased work of breathing with upright activity Ambulation Distance (Feet): 4 Feet Assistive device: 1 person hand held assist  Posture/Postural Control Posture/Postural Control: No significant  limitations    End of Session PT - End of Session Equipment Utilized During Treatment:  (supplemental O2) Activity Tolerance: Patient limited by fatigue;Treatment limited secondary to medical complications (Comment) (SOB) Patient left: in chair;with call bell in reach General Behavior During Session: [facility] for tasks performed Cognition: [facility] for tasks performed  Van Clines [facility] [place], [place] [phone]  02/02/2012, 11:38 AM

## 2012-02-03 LAB — CBC
HCT: 33.8 % — ABNORMAL LOW (ref 36.0–46.0)
Hemoglobin: 10.6 g/dL — ABNORMAL LOW (ref 12.0–15.0)
MCH: 23.9 pg — ABNORMAL LOW (ref 26.0–34.0)
MCHC: 31.4 g/dL (ref 30.0–36.0)
MCV: 76.1 fL — ABNORMAL LOW (ref 78.0–100.0)
Platelets: 201 10*3/uL (ref 150–400)
RBC: 4.44 MIL/uL (ref 3.87–5.11)
RDW: 14.5 % (ref 11.5–15.5)
WBC: 15.5 10*3/uL — ABNORMAL HIGH (ref 4.0–10.5)

## 2012-02-03 LAB — BASIC METABOLIC PANEL
BUN: 13 mg/dL (ref 6–23)
CO2: 28 mEq/L (ref 19–32)
Calcium: 9.7 mg/dL (ref 8.4–10.5)
Chloride: 105 mEq/L (ref 96–112)
Creatinine, Ser: 0.85 mg/dL (ref 0.50–1.10)
GFR calc Af Amer: 80 mL/min — ABNORMAL LOW (ref >90)
GFR calc non Af Amer: 69 mL/min — ABNORMAL LOW (ref >90)
Glucose, Bld: 192 mg/dL — ABNORMAL HIGH (ref 70–99)
Potassium: 4.8 mEq/L (ref 3.5–5.1)
Sodium: 139 mEq/L (ref 135–145)

## 2012-02-03 LAB — GLUCOSE, CAPILLARY
Glucose-Capillary: 194 mg/dL — ABNORMAL HIGH (ref 70–99)
Glucose-Capillary: 149 mg/dL — ABNORMAL HIGH (ref 70–99)
Glucose-Capillary: 183 mg/dL — ABNORMAL HIGH (ref 70–99)

## 2012-02-03 MED ORDER — ZOLPIDEM TARTRATE 5 MG PO TABS
5.0000 mg | ORAL_TABLET | Freq: Once | ORAL | Status: AC
  Administered 2012-02-03: 5 mg via ORAL
  Filled 2012-02-03: qty 1

## 2012-02-03 NOTE — Progress Notes (Signed)
Utilization review complete 

## 2012-02-03 NOTE — Progress Notes (Signed)
Subjective: Breathing better Side where she fell is sore No fevers No chills, + productive cough   Objective: Vital signs in last 24 hours: Filed Vitals:   02/02/12 2100 02/03/12 0200 02/03/12 0640 02/03/12 0848  BP: 116/67 128/72 145/84   Pulse: 76 84 88   Temp: 97.5 F (36.4 C) 97.6 F (36.4 C) 98.1 F (36.7 C)   TempSrc: Oral Axillary Oral   Resp: 16 18 17    Height:      Weight:   113.399 kg (250 lb)   SpO2: 97% 97% 98% 97%   Weight change: 0.899 kg (1 lb 15.7 oz)  Intake/Output Summary (Last 24 hours) at 02/03/12 0948 Last data filed at 02/03/12 0846  Gross per 24 hour  Intake 4267.17 ml  Output   2000 ml  Net 2267.17 ml    Physical Exam: General: Awake, Oriented, No acute distress. HEENT: EOMI. Neck: Supple CV: S1 and S2, rrr Lungs: B/L wheezing Abdomen: Soft, Nontender, Nondistended, +bowel sounds. Ext: Good pulses. Trace edema.   Lab Results:  [facility] 02/03/12 0525 02/02/12 0034  NA 139 132*  K 4.8 3.9  CL 105 96  CO2 28 22  GLUCOSE 192* 488*  BUN 13 9  CREATININE 0.85 0.99  CALCIUM 9.7 9.1  MG -- --  PHOS -- --    Basename 02/01/12 1930  AST 18  ALT 10  ALKPHOS 95  BILITOT 0.2*  PROT 6.6  ALBUMIN 3.4*   No results found for this basename: LIPASE:2,AMYLASE:2 in the last 72 hours  Basename 02/03/12 0525 02/02/12 0034 02/01/12 1930  WBC 15.5* 10.0 --  NEUTROABS -- -- 8.3*  HGB 10.6* 11.8* --  HCT 33.8* 36.6 --  MCV 76.1* 75.0* --  PLT 201 212 --   No results found for this basename: CKTOTAL:3,CKMB:3,CKMBINDEX:3,TROPONINI:3 in the last 72 hours No components found with this basename: POCBNP:3 No results found for this basename: DDIMER:2 in the last 72 hours No results found for this basename: HGBA1C:2 in the last 72 hours No results found for this basename: CHOL:2,HDL:2,LDLCALC:2,TRIG:2,CHOLHDL:2,LDLDIRECT:2 in the last 72 hours No results found for this basename: TSH,T4TOTAL,FREET3,T3FREE,THYROIDAB in the last 72 hours No results  found for this basename: VITAMINB12:2,FOLATE:2,FERRITIN:2,TIBC:2,IRON:2,RETICCTPCT:2 in the last 72 hours  Micro Results: Recent Results (from the past 240 hour(s))  CULTURE, BLOOD (ROUTINE X 2)     Status: Normal (Preliminary result)   Collection Time   02/02/12 12:40 AM      Component Value Range Status Comment   Specimen Description BLOOD LEFT HAND   Final    Special Requests     Final    Value: BOTTLES DRAWN AEROBIC AND ANAEROBIC 10CC BLUE,5CC RED   Culture  Setup Time [id number]   Final    Culture     Final    Value:        BLOOD CULTURE RECEIVED NO GROWTH TO DATE CULTURE WILL BE HELD FOR 5 DAYS BEFORE ISSUING A FINAL NEGATIVE REPORT   Report Status PENDING   Incomplete   CULTURE, BLOOD (ROUTINE X 2)     Status: Normal (Preliminary result)   Collection Time   02/02/12  1:00 AM      Component Value Range Status Comment   Specimen Description BLOOD RIGHT ARM   Final    Special Requests BOTTLES DRAWN AEROBIC AND ANAEROBIC [facility]   Final    Culture  Setup Time [id number]   Final    Culture     Final    Value:  BLOOD CULTURE RECEIVED NO GROWTH TO DATE CULTURE WILL BE HELD FOR 5 DAYS BEFORE ISSUING A FINAL NEGATIVE REPORT   Report Status PENDING   Incomplete     Studies/Results: Dg Chest 2 View  02/01/2012  *RADIOLOGY REPORT*  Clinical Data: Wheezing.  Fever.  Cough.  Congestion.  CHEST - 2 VIEW  Comparison: 04/05/2011  Findings: Heart size is normal.  Mediastinal shadows are normal. There may be bronchial thickening.  The patient has taken a poor inspiration.  There are increased markings at the bases probably related to poor inspiration.  Difficult to completely exclude mild basilar pneumonia given that poor inspiration.  No effusions.  No significant bony finding.  IMPRESSION: Bronchitis.  Poor inspiration.  Crowded markings at the lung bases probably related to that.  Given the poor inspiration, difficult to completely rule out mild basilar pneumonia.  Original Report  Authenticated By: Thomasenia Sales, M.D.   Ct Head Wo Contrast  02/01/2012  *RADIOLOGY REPORT*  Clinical Data: Dizziness.  Altered mental status.  CT HEAD WITHOUT CONTRAST  Technique:  Contiguous axial images were obtained from the base of the skull through the vertex without contrast.  Comparison: 06/13/2010.  Findings: No significant change in minimally enlarged ventricles and subarachnoid spaces and minimal patchy white matter low density in both frontal lobes.  No intracranial hemorrhage, mass lesion or CT evidence of acute infarction.  Unremarkable bones and included paranasal sinuses.  IMPRESSION: No significant change in minimal atrophy and minimal chronic small vessel white matter ischemic changes in both frontal lobes.  No acute abnormality.  Original Report Authenticated By: Darrol Angel, M.D.    Medications: I have reviewed the patient's current medications. Scheduled Meds:    . albuterol  2.5 mg Nebulization Q6H  . antiseptic oral rinse  15 mL Mouth Rinse BID  . azithromycin  250 mg Oral Daily  . cefTRIAXone (ROCEPHIN)  IV  1 g Intravenous QHS  . citalopram  20 mg Oral Daily  . heparin  5,000 Units Subcutaneous Q8H  . influenza  inactive virus vaccine  0.5 mL Intramuscular Tomorrow-1000  . insulin aspart  0-15 Units Subcutaneous TID WC  . insulin aspart  0-5 Units Subcutaneous QHS  . insulin aspart  10 Units Subcutaneous TID WC  . insulin aspart  5 Units Subcutaneous Once  . insulin glargine  20 Units Subcutaneous QHS  . ipratropium  0.5 mg Nebulization Q6H  . predniSONE  40 mg Oral Q breakfast  . sodium chloride  3 mL Intravenous Q12H  . DISCONTD: citalopram  40 mg Oral Daily   Continuous Infusions:    . sodium chloride 10 mL/hr at 02/02/12 2257   PRN Meds:.acetaminophen, acetaminophen, albuterol, clonazePAM, HYDROcodone-acetaminophen, ondansetron (ZOFRAN) IV, ondansetron  Assessment/Plan: Principal Problem:  *CAP (community acquired pneumonia)- abx, nebs, steroids-  will probably need O2- but will eval before D/C   Hypoxia- secondary to COPD/tobacco abuse/PNA   Fever- resolved before admission   Leukocytosis- on steroids   Confusion- resolved before admission   Diarrhea- resolved before admission   Diabetes mellitus- lantus, SSI, on steroids BS: 182-194, titrate as needed as patient is on steroids   COPD (chronic obstructive pulmonary disease)- may require O2, will check before patient is discharged  Tobacco abuse- encourage cessation  Hyponatremia- resolved with IVf  Fall at home- PT eval   LOS: 2 days  Kapono Luhn, DO 02/03/2012, 9:48 AM

## 2012-02-03 NOTE — Progress Notes (Signed)
Physical Therapy Treatment Patient Details Name: Yvonne [name] MRN: [id number] DOB: [birth year] Today's Date: 02/03/2012  PT Assessment/Plan  PT - Assessment/Plan Comments on Treatment Session: Improved since evaluation 3/10. Still with decreased activity tolerance. PT Plan: Discharge plan remains appropriate Follow Up Recommendations: Home health PT Equipment Recommended: None recommended by PT;Defer to next venue PT Goals  Acute Rehab PT Goals PT Goal: Supine/Side to Sit - Progress: Progressing toward goal PT Goal: Sit to Supine/Side - Progress: Progressing toward goal PT Goal: Sit to Stand - Progress: Progressing toward goal PT Goal: Stand to Sit - Progress: Progressing toward goal PT Goal: Ambulate - Progress: Progressing toward goal PT Goal: Up/Down Stairs - Progress: Other (comment) (not addressed)  PT Treatment Precautions/Restrictions  Precautions Precautions: Fall Precaution Comments: Desats on Room Air Mobility (including Balance) Bed Mobility Supine to Sit: 5: Supervision;With rails Supine to Sit Details (indicate cue type and reason): sat up before determined how she could do with HOB flat Transfers Sit to Stand: 5: Supervision Stand to Sit: 5: Supervision Ambulation/Gait Ambulation/Gait Assistance: Other (comment) (min guard A) Ambulation/Gait Assistance Details (indicate cue type and reason): generally limited by decr activity tolerance and was general steady Ambulation Distance (Feet): 160 Feet (with 1 standing rest break) Assistive device: None  Posture/Postural Control Posture/Postural Control: No significant limitations Balance Balance Assessed: No Exercise    End of Session PT - End of Session Activity Tolerance: Patient limited by fatigue;Patient tolerated treatment well Patient left: in bed;with call bell in reach Nurse Communication: Mobility status for ambulation General Behavior During Session: [facility] for tasks performed Cognition: [facility] for tasks  performed  Oprah Camarena, Eliseo Gum 02/03/2012, 5:16 PM  02/03/2012  [place] Bing, PT [phone] (pager)

## 2012-02-04 LAB — BLOOD GAS, ARTERIAL
Acid-Base Excess: 0.6 mmol/L (ref 0.0–2.0)
Bicarbonate: 25.4 mEq/L — ABNORMAL HIGH (ref 20.0–24.0)
Drawn by: 22251
O2 Content: 4 L/min
O2 Saturation: 93.7 %
Patient temperature: 98.6
TCO2: 26.8 mmol/L (ref 0–100)
pCO2 arterial: 46.5 mmHg — ABNORMAL HIGH (ref 35.0–45.0)
pH, Arterial: 7.357 (ref 7.350–7.400)
pO2, Arterial: 70.1 mmHg — ABNORMAL LOW (ref 80.0–100.0)

## 2012-02-04 LAB — GLUCOSE, CAPILLARY
Glucose-Capillary: 124 mg/dL — ABNORMAL HIGH (ref 70–99)
Glucose-Capillary: 99 mg/dL (ref 70–99)
Glucose-Capillary: 276 mg/dL — ABNORMAL HIGH (ref 70–99)
Glucose-Capillary: 408 mg/dL — ABNORMAL HIGH (ref 70–99)
Glucose-Capillary: 159 mg/dL — ABNORMAL HIGH (ref 70–99)

## 2012-02-04 LAB — LEGIONELLA ANTIGEN, URINE: Legionella Antigen, Urine: NEGATIVE

## 2012-02-04 MED ORDER — INSULIN ASPART 100 UNIT/ML ~~LOC~~ SOLN
13.0000 [IU] | Freq: Three times a day (TID) | SUBCUTANEOUS | Status: DC
  Administered 2012-02-04: 13 [IU] via SUBCUTANEOUS

## 2012-02-04 MED ORDER — INSULIN ASPART 100 UNIT/ML ~~LOC~~ SOLN: 0.0000 [IU] | Freq: Every day | SUBCUTANEOUS | Status: DC

## 2012-02-04 MED ORDER — INSULIN ASPART 100 UNIT/ML ~~LOC~~ SOLN
0.0000 [IU] | Freq: Three times a day (TID) | SUBCUTANEOUS | Status: DC
  Administered 2012-02-04: 20 [IU] via SUBCUTANEOUS

## 2012-02-04 MED ORDER — INSULIN ASPART 100 UNIT/ML ~~LOC~~ SOLN
0.0000 [IU] | Freq: Three times a day (TID) | SUBCUTANEOUS | Status: DC
  Administered 2012-02-05: 3 [IU] via SUBCUTANEOUS
  Administered 2012-02-05: 7 [IU] via SUBCUTANEOUS

## 2012-02-04 MED ORDER — INSULIN ASPART 100 UNIT/ML ~~LOC~~ SOLN
13.0000 [IU] | Freq: Three times a day (TID) | SUBCUTANEOUS | Status: DC
  Administered 2012-02-05 (×3): 13 [IU] via SUBCUTANEOUS

## 2012-02-04 MED ORDER — PREDNISONE 20 MG PO TABS
30.0000 mg | ORAL_TABLET | Freq: Every day | ORAL | Status: DC
  Administered 2012-02-05: 30 mg via ORAL
  Filled 2012-02-04 (×2): qty 1

## 2012-02-04 NOTE — Progress Notes (Signed)
Upon entering patient's room, patient has pulled out her IV and taken off her O2; O2 sats were 76 on room air. Oxygen was applied back on. Patient is currently 89%-90% on 3L [place]. MD made aware and orders given; will continue to monitor patient_______________________________________________________________D. Manson Passey RN

## 2012-02-04 NOTE — Progress Notes (Signed)
Subjective: More confused today, pulled out IV because it was "filthy" sats dropped to 76% on RA when patient removed O2  Objective: Vital signs in last 24 hours: Filed Vitals:   02/04/12 0516 02/04/12 0633 02/04/12 0749 02/04/12 1002  BP: 134/65 149/73  133/75  Pulse: 97 108  109  Temp: 99.4 F (37.4 C) 99.7 F (37.6 C)  100.2 F (37.9 C)  TempSrc: Oral Oral  Oral  Resp: 20 22  20   Height:      Weight:  113.807 kg (250 lb 14.4 oz)    SpO2: 92% 91% 98% 91%   Weight change: 0.408 kg (14.4 oz)  Intake/Output Summary (Last 24 hours) at 02/04/12 1156 Last data filed at 02/04/12 1126  Gross per 24 hour  Intake   1496 ml  Output    300 ml  Net   1196 ml    Physical Exam: General: Awake, Oriented, No acute distress. HEENT: EOMI. Neck: Supple CV: S1 and S2, rrr Lungs: B/L wheezing Abdomen: Soft, Nontender, Nondistended, +bowel sounds. Ext: Good pulses. Trace edema.   Lab Results:  [facility] 02/03/12 0525 02/02/12 0034  NA 139 132*  K 4.8 3.9  CL 105 96  CO2 28 22  GLUCOSE 192* 488*  BUN 13 9  CREATININE 0.85 0.99  CALCIUM 9.7 9.1  MG -- --  PHOS -- --    Basename 02/01/12 1930  AST 18  ALT 10  ALKPHOS 95  BILITOT 0.2*  PROT 6.6  ALBUMIN 3.4*   No results found for this basename: LIPASE:2,AMYLASE:2 in the last 72 hours  Basename 02/03/12 0525 02/02/12 0034 02/01/12 1930  WBC 15.5* 10.0 --  NEUTROABS -- -- 8.3*  HGB 10.6* 11.8* --  HCT 33.8* 36.6 --  MCV 76.1* 75.0* --  PLT 201 212 --   No results found for this basename: CKTOTAL:3,CKMB:3,CKMBINDEX:3,TROPONINI:3 in the last 72 hours No components found with this basename: POCBNP:3 No results found for this basename: DDIMER:2 in the last 72 hours No results found for this basename: HGBA1C:2 in the last 72 hours No results found for this basename: CHOL:2,HDL:2,LDLCALC:2,TRIG:2,CHOLHDL:2,LDLDIRECT:2 in the last 72 hours No results found for this basename: TSH,T4TOTAL,FREET3,T3FREE,THYROIDAB in the last  72 hours No results found for this basename: VITAMINB12:2,FOLATE:2,FERRITIN:2,TIBC:2,IRON:2,RETICCTPCT:2 in the last 72 hours  Micro Results: Recent Results (from the past 240 hour(s))  CULTURE, BLOOD (ROUTINE X 2)     Status: Normal (Preliminary result)   Collection Time   02/02/12 12:40 AM      Component Value Range Status Comment   Specimen Description BLOOD LEFT HAND   Final    Special Requests     Final    Value: BOTTLES DRAWN AEROBIC AND ANAEROBIC 10CC BLUE,5CC RED   Culture  Setup Time [id number]   Final    Culture     Final    Value:        BLOOD CULTURE RECEIVED NO GROWTH TO DATE CULTURE WILL BE HELD FOR 5 DAYS BEFORE ISSUING A FINAL NEGATIVE REPORT   Report Status PENDING   Incomplete   CULTURE, BLOOD (ROUTINE X 2)     Status: Normal (Preliminary result)   Collection Time   02/02/12  1:00 AM      Component Value Range Status Comment   Specimen Description BLOOD RIGHT ARM   Final    Special Requests BOTTLES DRAWN AEROBIC AND ANAEROBIC [facility]   Final    Culture  Setup Time [id number]   Final    Culture  Final    Value:        BLOOD CULTURE RECEIVED NO GROWTH TO DATE CULTURE WILL BE HELD FOR 5 DAYS BEFORE ISSUING A FINAL NEGATIVE REPORT   Report Status PENDING   Incomplete     Studies/Results: No results found.  Medications: I have reviewed the patient's current medications. Scheduled Meds:    . albuterol  2.5 mg Nebulization Q6H  . antiseptic oral rinse  15 mL Mouth Rinse BID  . azithromycin  250 mg Oral Daily  . cefTRIAXone (ROCEPHIN)  IV  1 g Intravenous QHS  . citalopram  20 mg Oral Daily  . heparin  5,000 Units Subcutaneous Q8H  . insulin aspart  0-15 Units Subcutaneous TID WC  . insulin aspart  0-5 Units Subcutaneous QHS  . insulin aspart  10 Units Subcutaneous TID WC  . insulin glargine  20 Units Subcutaneous QHS  . ipratropium  0.5 mg Nebulization Q6H  . predniSONE  40 mg Oral Q breakfast  . sodium chloride  3 mL Intravenous Q12H  . zolpidem  5  mg Oral Once   Continuous Infusions:    . sodium chloride 10 mL/hr at 02/02/12 2257   PRN Meds:.acetaminophen, acetaminophen, albuterol, clonazePAM, HYDROcodone-acetaminophen, ondansetron (ZOFRAN) IV, ondansetron  Assessment/Plan: AMS- check ABG, ? Steroids vs CO2 retention, no history of alcohol abuse   *CAP (community acquired pneumonia)- abx, nebs, steroids- will probably need O2 at home- but will eval before D/C   Hypoxia- secondary to COPD/tobacco abuse/PNA   Fever- resolved before admission   Leukocytosis- on steroids   Diarrhea- resolved before admission   Diabetes mellitus- lantus, SSI, on steroids BS: 99-276, titrate as needed as patient is on steroids   COPD (chronic obstructive pulmonary disease)- may require O2, will check before patient is discharged  Tobacco abuse- encourage cessation  Hyponatremia- resolved with IVf  Fall at home- PT eval- doing better   LOS: 3 days  Yvonne Ging, DO 02/04/2012, 11:56 AM

## 2012-02-04 NOTE — Progress Notes (Signed)
PT Cancellation Note  Treatment cancelled today due to medical issues with patient which prohibited therapy. Noted events from last night. Patient with increased confusion and agitation. Pt possibly being transferred per staff. Will hold at this time and attempt to see next day.   THanks 02/04/2012 Fredrich Birks PTA [phone] pager [phone] office     Fredrich Birks 02/04/2012, 1:47 PM

## 2012-02-04 NOTE — Progress Notes (Signed)
Patient's CBG is 408, MD notified and orders given to administer 20 units of sliding scale; patient's scheduled insulin has also been increased._____________________________________________________________D. Manson Passey RN

## 2012-02-05 ENCOUNTER — Encounter: Payer: 59 | Admitting: Physical Medicine and Rehabilitation

## 2012-02-05 DIAGNOSIS — G9340 Encephalopathy, unspecified: Secondary | ICD-10-CM

## 2012-02-05 LAB — GLUCOSE, CAPILLARY
Glucose-Capillary: 102 mg/dL — ABNORMAL HIGH (ref 70–99)
Glucose-Capillary: 233 mg/dL — ABNORMAL HIGH (ref 70–99)
Glucose-Capillary: 135 mg/dL — ABNORMAL HIGH (ref 70–99)

## 2012-02-05 MED ORDER — ALBUTEROL SULFATE HFA 108 (90 BASE) MCG/ACT IN AERS: 2.0000 | INHALATION_SPRAY | Freq: Four times a day (QID) | RESPIRATORY_TRACT | Status: DC | PRN

## 2012-02-05 MED ORDER — MOXIFLOXACIN HCL 400 MG PO TABS: 400.0000 mg | ORAL_TABLET | Freq: Every day | ORAL | Status: AC

## 2012-02-05 MED ORDER — ZOLPIDEM TARTRATE 10 MG PO TABS: 10.0000 mg | ORAL_TABLET | Freq: Every day | ORAL | Status: DC

## 2012-02-05 NOTE — Progress Notes (Signed)
Pt ambulated with Nurse Tech in hallway. O2 sat on room air was 88% when initiated ambulating and 77% upon arrival to room after ambulating approximately 50 ft. Notified MD.

## 2012-02-05 NOTE — Progress Notes (Signed)
   CARE MANAGEMENT NOTE 02/05/2012  Patient:  Yvonne [name], Yvonne [name]   Account Number:  [id number]  Date Initiated:  02/05/2012  Documentation initiated by:  Donn Pierini  Subjective/Objective Assessment:   Pt admitted with PNA     Action/Plan:   PTA pt lived at home with family, was independent with ADLs- PT eval ordered   Anticipated DC Date:  02/05/2012   Anticipated DC Plan:  HOME W HOME HEALTH SERVICES      DC Planning Services  CM consult      [facility] Choice  HOME HEALTH  DURABLE MEDICAL EQUIPMENT   Choice offered to / List presented to:  C-1 Patient   DME arranged  [facility]      DME agency  [facility]     HH arranged  HH-2 PT      HH agency  [facility]   Status of service:  Completed, signed off Medicare Important Message given?   (If response is "NO", the following Medicare IM given date fields will be blank) Date Medicare IM given:   Date Additional Medicare IM given:    Discharge Disposition:  HOME W HOME HEALTH SERVICES  Per UR Regulation:  Reviewed for med. necessity/level of care/duration of stay  If discussed at Long Length of Stay Meetings, dates discussed:    Comments:  PCP- [place]  02/05/12- 1530- Donn Pierini RN, BSN [phone] Pt for discharge today with need for home O2, order also for HH-PT. spoke with pt at bedside - list of [facility] agencies for [facility] given to pt- pt choice for [facility] services is [facility]- referral sent to [facility] via [facility] and spoke with Hilda Lias from [facility] regarding referral- also spoke with Darrien from [facility] regarding Home O2 need. Pt reports that there are no other d/c needs. HH services to begin 24-48 hr post discharge. update- has pt has [employer] - home O2 will need to be arranged with [employer]- as they are contracted with [facility] notified and referral faxed to [employer] for Home O2- informed pt of change to home O2 provided- pt voiced understanding.

## 2012-02-05 NOTE — Progress Notes (Signed)
SATURATION QUALIFICATIONS:  Patient Saturations on Room Air at Rest = 95%  Patient Saturations on Room Air while Ambulating = 86%  Patient Saturations on 2 Liters of oxygen while Ambulating = 90%;  3L oxygen while ambulating = 91%

## 2012-02-05 NOTE — Progress Notes (Signed)
   CARE MANAGEMENT NOTE 02/05/2012  Patient:  Yvonne [name], Yvonne [name]   Account Number:  [id number]  Date Initiated:  02/05/2012  Documentation initiated by:  Donn Pierini  Subjective/Objective Assessment:   Pt admitted with PNA     Action/Plan:   PTA pt lived at home with family, was independent with ADLs- PT eval ordered   Anticipated DC Date:  02/05/2012   Anticipated DC Plan:  HOME W HOME HEALTH SERVICES      DC Planning Services  CM consult      [facility] Choice  HOME HEALTH  DURABLE MEDICAL EQUIPMENT   Choice offered to / List presented to:  C-1 Patient   DME arranged  [facility]      DME agency  [facility]     HH arranged  HH-2 PT      [facility] agency  [facility]   Status of service:  Completed, signed off Medicare Important Message given?   (If response is "NO", the following Medicare IM given date fields will be blank) Date Medicare IM given:   Date Additional Medicare IM given:    Discharge Disposition:  HOME W HOME HEALTH SERVICES  Per UR Regulation:  Reviewed for med. necessity/level of care/duration of stay  If discussed at Long Length of Stay Meetings, dates discussed:    Comments:  PCP- Beauchamp  02/05/12- 1530- Donn Pierini RN, BSN [phone] Pt for discharge today with need for home O2, order also for HH-PT. spoke with pt at bedside - list of [facility] agencies for [facility] given to pt- pt choice for [facility] services is [facility]- referral sent to [facility] via [facility] and spoke with Hilda Lias from [facility] regarding referral- also spoke with Darrien from [facility] regarding Home O2 need. Pt reports that there are no other d/c needs. HH services to begin 24-48 hr post discharge.

## 2012-02-05 NOTE — Discharge Instructions (Signed)
HH-PT arranged with [facility], Home O2 arranged with Christoper Allegra [phone]

## 2012-02-05 NOTE — Progress Notes (Signed)
HOME HEALTH AGENCIES SERVING [place]   Agencies that are Medicare-Certified and are affiliated with [facility]  Telephone Number Address  [facility]   The [facility] has ownership interest in this company; however, you are under no obligation to use this agency. [phone] or  [phone] [address] [place], [place] [address]   Agencies that are Medicare-Certified and are not affiliated with [facility]                                                                                 Home Health Agency Telephone Number Address  [facility] [phone] Fax [phone] [address] [place], [place]  [address]  [facility] [phone] or [phone] Fax [phone] [address] [place], [place] [address]  [facility] [phone] Fax [phone] [address] [place], [place] [address]  [facility] [phone] Fax [phone] [address], [address] [place], [place]  [address]  [facility] [phone] Fax [phone] [address] [place], [place] [address]  [facility] [phone] [address] [place], [place] [address]  [facility] [phone]  [address] [place], [place] [address]  [facility] [phone] or [phone] Fax [phone] [address] [place], [place]  [address]  [facility] [phone] Fax [phone] [address] [place], [place]  [address]  [facility]  [phone] Fax [phone] [address] [place], [place] [address]               Agencies that are not Medicare-Certified and are not affiliated with [facility]   Home Health Agency Telephone Number Address  [facility] [phone] or [phone] Fax [phone] [address] [place], [place]  [address]  [facility] [phone] Fax [phone] [address] [place], [place]  [address]  [facility]  [phone] Fax [phone] [address] [place], [place] [address]  [facility] [phone] Fax [phone] [address] , [place] [address]  [facility] [phone] or [phone] Fax [phone] [address] [place], [place]  [address]  [facility] [phone] or [phone] Fax [phone] [address] [place], [place]  [address]  [facility] [phone] Fax [phone] [address] [place], [place]  [address]  [facility] [phone] [address] [place], [place]  [address]  [facility] [phone] Fax [phone] [address] [place], [place]  [address]  [facility] [phone] Fax [phone] [address] [place], [place]  [address]  [facility] [phone] Fax [phone] [address] [place], [place] [address]  [facility] [phone] Fax [phone] [address] [place],   [address]   

## 2012-02-05 NOTE — Progress Notes (Signed)
Pt discharged home with son. Discussed discharge instructions with pt including follow up appt, medications to take at home, activity and diet restrictions, use of O2 at home, and how and when to call the dr. Pt and son verbalized understanding and denied questions. Pt denied pain or concerns. Pt being discharged with 2L O2 via [place]. SpO2 92% at time of discharge.

## 2012-02-05 NOTE — Progress Notes (Signed)
Physical Therapy Treatment Patient Details Name: Yvonne [name] MRN: [id number] DOB: [birth year] Today's Date: 02/05/2012  PT Assessment/Plan  PT - Assessment/Plan Comments on Treatment Session: Ready to D/C but needs home O2; see O2 qualification data in progress notes. PT Plan: Discharge plan remains appropriate Follow Up Recommendations: Home health PT PT Goals  Acute Rehab PT Goals PT Goal: Supine/Side to Sit - Progress: Met PT Goal: Sit to Supine/Side - Progress: Met PT Goal: Sit to Stand - Progress: Met PT Goal: Stand to Sit - Progress: Met PT Goal: Ambulate - Progress: Met PT Goal: Up/Down Stairs - Progress: Met  PT Treatment Precautions/Restrictions  Precautions Precautions: Fall Precaution Comments: Desats on Room Air Restrictions Weight Bearing Restrictions: No Mobility (including Balance) Bed Mobility Supine to Sit: 7: Independent Transfers Transfers: Yes Sit to Stand: 7: Independent Stand to Sit: 7: Independent Ambulation/Gait Ambulation/Gait: Yes Ambulation/Gait Assistance: 7: Independent Ambulation Distance (Feet): 200 Feet Assistive device: None Gait Pattern: Within Functional Limits Stairs: Yes Stairs Assistance: 6: Modified independent (Device/Increase time) Stair Management Technique: One rail Left;Alternating pattern;Step to pattern;Forwards Number of Stairs: 2   Posture/Postural Control Posture/Postural Control: No significant limitations Balance Balance Assessed: No Exercise    End of Session PT - End of Session Activity Tolerance: Patient tolerated treatment well Patient left: in bed;with call bell in reach Nurse Communication: Mobility status for ambulation General Behavior During Session: [facility] for tasks performed Cognition: [facility] for tasks performed  Traye Bates, Eliseo Gum 02/05/2012, 3:42 PM  02/05/2012  [place] Bing, PT [phone] (pager)

## 2012-02-05 NOTE — Discharge Summary (Signed)
Physician Discharge Summary  Patient ID: Yvonne [name] MRN: [id number] DOB/AGE: Oct 26, 1946 66 y.o.  Admit date: 02/01/2012 Discharge date: 02/05/2012  Primary Care Physician:  Dayna Barker, MD   Discharge Diagnoses:    Principal Problem:  *CAP (community acquired pneumonia) Active Problems:  Hypoxia  Fever  Leukocytosis  Bronchitis  Confusion  Diarrhea  Diabetes mellitus  COPD (chronic obstructive pulmonary disease)  Encephalopathy     Medication List  As of 02/05/2012  2:24 PM   TAKE these medications         albuterol 108 (90 BASE) MCG/ACT inhaler   Commonly known as: PROVENTIL HFA;VENTOLIN HFA   Inhale 2 puffs into the lungs every 6 (six) hours as needed for wheezing.      citalopram 40 MG tablet   Commonly known as: CELEXA   Take 40 mg by mouth daily.      clonazePAM 1 MG tablet   Commonly known as: KLONOPIN   Take 1 mg by mouth 3 (three) times daily as needed. For anxiety.      gabapentin 400 MG capsule   Commonly known as: NEURONTIN   Take 400 mg by mouth every 4 (four) hours.      insulin glargine 100 UNIT/ML injection   Commonly known as: LANTUS   Inject 20 Units into the skin See admin instructions. Inject 20 units daily per sliding scale.      metFORMIN 500 MG tablet   Commonly known as: GLUCOPHAGE   Take 500 mg by mouth 2 (two) times daily with a meal.      montelukast 10 MG tablet   Commonly known as: SINGULAIR   Take 10 mg by mouth daily.      moxifloxacin 400 MG tablet   Commonly known as: AVELOX   Take 1 tablet (400 mg total) by mouth daily.      oxyCODONE-acetaminophen 10-325 MG per tablet   Commonly known as: PERCOCET   Take 1 tablet by mouth 4 (four) times daily.      promethazine 25 MG tablet   Commonly known as: PHENERGAN   Take 12.5-25 mg by mouth 3 (three) times daily as needed. For nausea/vomiting      ranitidine 150 MG capsule   Commonly known as: ZANTAC   Take 150 mg by mouth 2 (two) times daily.      zolpidem 10 MG  tablet   Commonly known as: AMBIEN   Take 10 mg by mouth at bedtime.              Disposition and Follow-up:  Will be discharged home today in stable and improved condition. Will do ambulating oxygen sats to see if she qualifies for home oxygen.  Consults:  None    Significant Diagnostic Studies:  Dg Chest 2 View  02/01/2012  *RADIOLOGY REPORT*  Clinical Data: Wheezing.  Fever.  Cough.  Congestion.  CHEST - 2 VIEW  Comparison: 04/05/2011  Findings: Heart size is normal.  Mediastinal shadows are normal. There may be bronchial thickening.  The patient has taken a poor inspiration.  There are increased markings at the bases probably related to poor inspiration.  Difficult to completely exclude mild basilar pneumonia given that poor inspiration.  No effusions.  No significant bony finding.  IMPRESSION: Bronchitis.  Poor inspiration.  Crowded markings at the lung bases probably related to that.  Given the poor inspiration, difficult to completely rule out mild basilar pneumonia.  Original Report Authenticated By: Thomasenia Sales, M.D.   Ct Head  Wo Contrast  02/01/2012  *RADIOLOGY REPORT*  Clinical Data: Dizziness.  Altered mental status.  CT HEAD WITHOUT CONTRAST  Technique:  Contiguous axial images were obtained from the base of the skull through the vertex without contrast.  Comparison: 06/13/2010.  Findings: No significant change in minimally enlarged ventricles and subarachnoid spaces and minimal patchy white matter low density in both frontal lobes.  No intracranial hemorrhage, mass lesion or CT evidence of acute infarction.  Unremarkable bones and included paranasal sinuses.  IMPRESSION: No significant change in minimal atrophy and minimal chronic small vessel white matter ischemic changes in both frontal lobes.  No acute abnormality.  Original Report Authenticated By: Darrol Angel, M.D.    Brief H and P: For complete details please refer to admission H and P, but in brief patient is a   65yoF with h/o diabetes on insulin, pain syndrome, daily tobacco abuse presents with diarrhea, fevers, cough, confusion and found on CXR to have bronchitis/cannot r/o PNA.  Pt is quite reliable historian, despite coming into ED with confusion. She states that starting  Friday night she developed non-painful diarrhea with some nausea, but no vomiting, and minimal  crampy diffuse abdominal pain. She started feeling confused, noticing this herself, that she  "couldn't remember who she was" and had difficulty getting out of bed. ED notes indicate her son  found her on the ground crawling around. EMS called and on arrival, EMS noted her to be very  wheezy. She also endorses a dry cough for the past couple days as well, and subjective fevers. Of  note, she smokes a pack a day.  In the ED pt was febrile up to 102.4, tachy up to 138, BP preserved, lowest O2 90%. She was not  noted to be confused, but was persistently wheezy. We are asked to admit her for further evaluation and management.     Hospital Course:  Principal Problem:  *CAP (community acquired pneumonia) Active Problems:  Hypoxia  Fever  Leukocytosis  Bronchitis  Confusion  Diarrhea  Diabetes mellitus  COPD (chronic obstructive pulmonary disease)  Encephalopathy  #1 Encephalopathy: Resolved. Presumed 2/2 hypoxemia.  #2 CAP: will discharge on a 7 day course of avelox. Received rocephin/azithromycin while;e in the hospital. I would recommend a CXR in 4-6 weeks to ensure complete resolution of her PNA. Will do ambulating oxygen sats to see if she qualifies for home oxygen prior to discharge.  #3 Acute Hypoxic Respiratory Failure: assuming 2/2 PNA, however may have some COPD given longstanding tobacco abuse.  Rest of chronic medical issues have been stable this admission and her home medications have not been altered.  Time spent on Discharge: Greater than 30 minutes.  SignedChaya Jan [facility] Pager:  [phone] 02/05/2012, 2:19 PM

## 2012-02-06 ENCOUNTER — Telehealth: Payer: Self-pay | Admitting: *Deleted

## 2012-02-06 MED ORDER — OXYCODONE-ACETAMINOPHEN 10-325 MG PO TABS: 1.0000 | ORAL_TABLET | Freq: Four times a day (QID) | ORAL | Status: DC

## 2012-02-06 NOTE — Telephone Encounter (Signed)
Son states Ms Charter discharged from hospital yesterday, had pneumonia. (Verified adm on 02/01/12)  Missed scheduled app't yesterday. Req to pick up Percocet 10/325 qid #120, last written 01/06/12, at RN visit.  Please print and sign if ok to refill and have son pick up Rx.  Thanks

## 2012-02-07 ENCOUNTER — Other Ambulatory Visit: Payer: Self-pay | Admitting: *Deleted

## 2012-02-07 NOTE — Telephone Encounter (Signed)
Rx signed and ready to pick up.

## 2012-02-08 LAB — CULTURE, BLOOD (ROUTINE X 2)
Culture  Setup Time: 201303101124
Culture  Setup Time: 201303101124
Culture: NO GROWTH
Culture: NO GROWTH

## 2012-03-09 ENCOUNTER — Encounter: Payer: Medicare HMO | Attending: Neurosurgery | Admitting: Physical Medicine and Rehabilitation

## 2012-03-09 ENCOUNTER — Encounter: Payer: Self-pay | Admitting: Physical Medicine and Rehabilitation

## 2012-03-09 VITALS — BP 123/56 | HR 80 | Resp 18 | Ht 62.0 in | Wt 243.0 lb

## 2012-03-09 DIAGNOSIS — M25561 Pain in right knee: Secondary | ICD-10-CM

## 2012-03-09 DIAGNOSIS — M79609 Pain in unspecified limb: Secondary | ICD-10-CM

## 2012-03-09 DIAGNOSIS — M79601 Pain in right arm: Secondary | ICD-10-CM

## 2012-03-09 DIAGNOSIS — M545 Low back pain, unspecified: Secondary | ICD-10-CM

## 2012-03-09 DIAGNOSIS — M542 Cervicalgia: Secondary | ICD-10-CM

## 2012-03-09 DIAGNOSIS — M25649 Stiffness of unspecified hand, not elsewhere classified: Secondary | ICD-10-CM

## 2012-03-09 DIAGNOSIS — M25569 Pain in unspecified knee: Secondary | ICD-10-CM

## 2012-03-09 DIAGNOSIS — G8929 Other chronic pain: Secondary | ICD-10-CM

## 2012-03-09 MED ORDER — OXYCODONE-ACETAMINOPHEN 10-325 MG PO TABS: 1.0000 | ORAL_TABLET | Freq: Four times a day (QID) | ORAL | Status: DC

## 2012-03-09 NOTE — Progress Notes (Signed)
Subjective:    Patient ID: Yvonne [name], female    DOB: September 13, 1946, 66 y.o.   MRN: [id number]  HPIMs. Yvonne [name] is a pleasant 66 year old woman who is accompanied by  her son to [facility]. [facility] multiple pain complaints. She has been  recovering for over a year now from a right brachial plexus injury,  status post a fall. She had been followed by orthopedic surgeon, Dr.  Rennis Chris for some time.  Her chronic pain complaints include right knee pain, low back pain, right arm pain and neck pain.   She has a history of low back pain.   She has a  history of significant left groin debridement tissue loss remotely.   She  has had some complaints more recently of bilateral knee pain.  Radiographs were ordered, however, she did not get them completed.    Today, she is also complaining of some hand achiness and stiffness  bilaterally. She has been seen at [facility] over a year ago.    Apparently, there was a mildly positive rheumatoid factor at that time.  She has not continued to follow up at [facility] for this, however.    She continues to report average pain about 9 on a scale of 10. She  reports fair to good relief with current medications. She continues to  use gabapentin every 4 hours and finds it to be quite helpful. She also  uses 5-10/325 Percocet per day. She missed her last appointment and  subsequently has run out of her pain medications due to not making her  appointment week and a half ago.  There is no changes with respect to function. She can walk 4-5 minutes  at a time. She is able to climb stairs. She does not drive. She  reports requiring some assistance with ADL and denies suicidal ideation.  Admits to occasional bladder problems, weakness, numbness, tremors,  tingling, trouble walking, spasms, dizziness, confusion, depression and  anxiety.  She also has multiple complaints with respect  to review of systems. I  asked her to maintain contact with primary care for these other issues.     Pain Inventory Average Pain 5 Pain Right Now 8 My pain is constant, sharp, stabbing and aching  In the last 24 hours, has pain interfered with the following? General activity 7 Relation with others 7 Enjoyment of life 7 What TIME of day is your pain at its worst? daytime and evening Sleep (in general) Fair  Pain is worse with: walking, bending and some activites Pain improves with: medication Relief from Meds: 4  Mobility walk without assistance how many minutes can you walk? 5 ability to climb steps?  no do you drive?  no needs help with transfers  Function disabled: date disabled 1995 I need assistance with the following:  household duties and shopping  Neuro/Psych tingling trouble walking  Prior Studies Any changes since last visit?  yes x-rays  Physicians involved in your care Any changes since last visit?  no      Review of Systems  Respiratory: Positive for shortness of breath.   Musculoskeletal: Positive for back pain, arthralgias and gait problem.  All other systems reviewed and are negative.       Objective:   Physical Exam  She is an obese woman  who appears her stated age and does not appear in any distress. She is  oriented x3. Speech  is clear. Her affect is bright. She is alert,  cooperative and pleasant. Follows commands without difficulty. Answers  my questions appropriately. Cranial nerves and coordination are grossly  intact. Her reflexes are 1+ in the upper extremities and diminished in  the lower extremities. No abnormal tone, clonus, or tremors are noted.  Motor strength overall is quite good, 5/5 in upper and lower  extremities.  Transitions from sitting to standing without difficulty. Some  difficulty with tandem gait is noted. Romberg test is performed  adequately. She complains of some discomfort with neck range of motion  as  well as lumbar motion specially end range forward flexion with  respect to lumbar spine and end range extension with respect to lumbar  spine.  Sitting, internal and external rotation of the hips does not aggravate  pain. Bilateral knees are evaluated. She has no obvious effusion,  although she does have excess soft tissue in these areas. She does not  appear to have any crepitus with flexion, extension at the knees, but  she does complain of pain. She has some joint line pain especially  medial right knee.  No obvious AP or lateral instability other than some medial laxity on  the right is appreciated.     3/13 head ct  IMPRESSION:  "No significant change in minimal atrophy and minimal chronic small  vessel white matter ischemic changes in both frontal lobes. No  acute abnormality."  Cervical ct "1. Bilateral C7 cervical ribs.  2. No fracture or acute subluxation is identified.  3. Scarring or atelectasis in the lung apices."  "    Assessment & Plan:  1. Bilateral knee pain. Radiographs were ordered at the last visit,  but the patient did not get these completed. I will reorder them  again today and see her back in 3 weeks.    2. History of chronic low back pain.    3. History of left groin debridement with significant tissue loss  remotely.   4. Status post fall June 12, 2010, with history of right shoulder  dislocation and subsequent brachial plexus injury with residual  incoordination with fine motor movements. However, pain is  significantly improved over the last year.   5. The patient complains again today of some bilateral hand stiffness  and discomfort especially in the area of the metacarpophalangeal  joint. She does have some thickening bilaterally. She was seen  over a year ago at [facility] and apparently a Rheumatology Clinic  was told, she has a mildly positive rheumatoid factor. She is  interested in following back up again with rheumatologist  perhaps.  I asked her to follow up with primary care to see if she can get a  referral for this. I may consider repeating rheumatoid factor ANA  and sed rate at the next visit as well. I will see her back in 3  Weeks.   Request notes from [facility] rheumatology clinic today.  We will check pill counts, possibly UDS and review knee x-rays  at that time. She is comfortable with the plan at this time. I  have answered all her questions.   We discussed possibly starting some physical therapy for her right knee next month  We also talked about starting to exercise. This would include walking 5 minutes 3 times a day over the next week and a half then going up to the 10 minutes 3 times a day.

## 2012-03-09 NOTE — Patient Instructions (Addendum)
followup with Justinn Welter in one month to review right knee x-rays and refill medications  Please make sure you keep your pain medications in a safe location please keep them locked up   I would like to start walking 3 times a day for 5 minutes each.

## 2012-04-06 ENCOUNTER — Ambulatory Visit (HOSPITAL_COMMUNITY)
Admission: RE | Admit: 2012-04-06 | Discharge: 2012-04-06 | Disposition: A | Discharge status: Home / Self Care | Payer: Medicare PPO | Source: Ambulatory Visit | Attending: Physical Medicine and Rehabilitation | Admitting: Physical Medicine and Rehabilitation

## 2012-04-06 DIAGNOSIS — M25561 Pain in right knee: Secondary | ICD-10-CM

## 2012-04-06 DIAGNOSIS — M1120 Other chondrocalcinosis, unspecified site: Secondary | ICD-10-CM | POA: Insufficient documentation

## 2012-04-06 DIAGNOSIS — M25569 Pain in unspecified knee: Secondary | ICD-10-CM | POA: Insufficient documentation

## 2012-04-08 ENCOUNTER — Encounter: Payer: Medicare PPO | Attending: Neurosurgery | Admitting: Physical Medicine and Rehabilitation

## 2012-04-08 ENCOUNTER — Encounter: Payer: Self-pay | Admitting: Physical Medicine and Rehabilitation

## 2012-04-08 VITALS — BP 152/84 | HR 80 | Resp 14 | Ht 62.0 in | Wt 238.0 lb

## 2012-04-08 DIAGNOSIS — M25649 Stiffness of unspecified hand, not elsewhere classified: Secondary | ICD-10-CM

## 2012-04-08 DIAGNOSIS — M549 Dorsalgia, unspecified: Secondary | ICD-10-CM

## 2012-04-08 DIAGNOSIS — M79601 Pain in right arm: Secondary | ICD-10-CM

## 2012-04-08 DIAGNOSIS — M25569 Pain in unspecified knee: Secondary | ICD-10-CM

## 2012-04-08 DIAGNOSIS — M545 Low back pain, unspecified: Secondary | ICD-10-CM

## 2012-04-08 DIAGNOSIS — M25561 Pain in right knee: Secondary | ICD-10-CM

## 2012-04-08 DIAGNOSIS — M542 Cervicalgia: Secondary | ICD-10-CM

## 2012-04-08 DIAGNOSIS — G8929 Other chronic pain: Secondary | ICD-10-CM

## 2012-04-08 DIAGNOSIS — M79609 Pain in unspecified limb: Secondary | ICD-10-CM

## 2012-04-08 MED ORDER — DICLOFENAC SODIUM 1 % TD GEL: 1.0000 "application " | Freq: Four times a day (QID) | TRANSDERMAL | Status: DC

## 2012-04-08 MED ORDER — GABAPENTIN 400 MG PO CAPS: 400.0000 mg | ORAL_CAPSULE | ORAL | Status: DC

## 2012-04-08 MED ORDER — OXYCODONE-ACETAMINOPHEN 10-325 MG PO TABS: 1.0000 | ORAL_TABLET | Freq: Four times a day (QID) | ORAL | Status: DC

## 2012-04-08 NOTE — Patient Instructions (Addendum)
Your pain medications locked up and in a secure location  Followup with nursing staff next 2 months for refill of her pain pain medications . Yvonne [name] will  See you back in 3 months  I have ordered voltaren gel for your r knee We have also ordered some physical therapy for you as well her graph

## 2012-04-08 NOTE — Progress Notes (Signed)
Subjective:    Patient ID: Yvonne [name], female    DOB: [birth year], 66 y.o.   MRN: [id number]  HPI Yvonne [name] is a pleasant 66 year old woman who is accompanied by  her son to [facility]. She is [facility]  [facility] for multiple pain complaints. She has been  recovering for over a year now from a right brachial plexus injury,  status post a fall. She had been followed by orthopedic surgeon, Dr.  Rennis Chris for some time.  Her chronic pain complaints include right knee pain, low back pain, right arm pain and neck pain.  She has a history of low back pain.  She has a  history of significant left groin debridement tissue loss remotely.  She  has had some complaints more recently of bilateral knee pain.    Her main complaints today are right knee pain and low back pain.   Radiographs were ordered, however, she did not get them completed.  Today, she is also complaining of some hand achiness and stiffness  bilaterally. She has been seen at [facility] over a year ago.  Apparently, there was a mildly positive rheumatoid factor at that time.  She has not continued to follow up at [facility] for this, however.     She continues to report average pain about 9 on a scale of 10. She  reports fair to good relief with current medications. She continues to  use gabapentin every 4 hours and finds it to be quite helpful. She also  uses 5-10/325 Percocet per day. There is no changes with respect to function. She can walk 4-5 minutes  at a time. She is able to climb stairs. She does not drive.   She  reports requiring some assistance with ADL and denies suicidal ideation.  Admits to occasional bladder problems, weakness, numbness, tremors,  tingling, trouble walking, spasms, dizziness, confusion, depression and  anxiety.  She also has multiple complaints with respect to review of systems. I  asked her to maintain con  Pain Inventory Average  Pain 8 Pain Right Now 8 My pain is constant, sharp and aching  In the last 24 hours, has pain interfered with the following? General activity 2 Relation with others 5 Enjoyment of life 6 What TIME of day is your pain at its worst? evening and night Sleep (in general) Poor  Pain is worse with: bending and some activites Pain improves with: medication Relief from Meds: 2  Mobility walk with assistance how many minutes can you walk? 15 min ability to climb steps?  no do you drive?  no needs help with transfers  Function disabled: date disabled  I need assistance with the following:  dressing, household duties and shopping  Neuro/Psych weakness tingling spasms depression  Prior Studies Any changes since last visit?  no  Physicians involved in your care Any changes since last visit?  no       Review of Systems  Constitutional: Positive for diaphoresis and unexpected weight change.  Gastrointestinal: Positive for constipation.  Musculoskeletal: Positive for joint swelling.  Neurological: Positive for weakness and numbness.  Psychiatric/Behavioral: Positive for dysphoric mood.  All other systems reviewed and are negative.       Objective:   Physical Exam She is an obese woman  who appears her stated age and does not appear in any distress. She is  oriented x3. Speech is clear. Her affect is bright. She is alert,  cooperative  and pleasant. Follows commands without difficulty. Answers  my questions appropriately. Cranial nerves and coordination are grossly  intact. Her reflexes are 1+ in the upper extremities and diminished in  the lower extremities. No abnormal tone, clonus, or tremors are noted.  Motor strength overall is quite good, 5/5 in upper and lower  extremities.  Transitions from sitting to standing without difficulty. Some  difficulty with tandem gait is noted. Romberg test is performed  adequately. She complains of some discomfort with neck range  of motion  as well as lumbar motion specially end range forward flexion with  respect to lumbar spine and end range extension with respect to lumbar  spine.  Sitting, internal and external rotation of the hips does not aggravate  pain. Bilateral knees are evaluated. She has no obvious effusion,  although she does have excess soft tissue in these areas. She does not  appear to have any crepitus with flexion, extension at the knees, but  she does complain of pain. She has some joint line pain especially  medial right knee.  No obvious AP or lateral instability other than some medial laxity on  the right is appreciated.     Assessment Plan 1. Bilateral knee pain. 2. History of chronic low back pain.  3. History of left groin debridement with significant tissue loss  remotely.  4. Status post fall June 12, 2010, with history of right shoulder  dislocation and subsequent brachial plexus injury with residual  incoordination with fine motor movements. However, pain is  significantly improved over the last year.  5. The patient complains again today of some bilateral hand stiffness  and discomfort especially in the area of the metacarpophalangeal  joint. She does have some thickening bilaterally. She was seen  over a year ago at [facility] and apparently a Rheumatology Clinic  was told, she has a mildly positive rheumatoid factor. She is  interested in following back up again with rheumatologist perhaps.  I asked her to follow up with primary care to see if she can get a  referral for this. I may consider repeating rheumatoid factor ANA  and sed rate at the next visit as well. I will see her back in 3  Weeks.  Request notes from [facility] rheumatology clinic today.  We will check pill counts, possibly UDS and review knee x-rays  at that time. She is comfortable with the plan at this time. I  have answered all her questions.  We discussed possibly starting some physical therapy for  her right knee next month  We also talked about starting to exercise. This would include walking 5 minutes 3 times a day over the next week and a half then going up to the 10 minutes 3 times a day.

## 2012-04-13 NOTE — Progress Notes (Signed)
I am not seeing the result in this note can you check on this please

## 2012-05-06 ENCOUNTER — Encounter: Payer: 59 | Admitting: Physical Medicine and Rehabilitation

## 2012-05-08 ENCOUNTER — Encounter: Payer: Medicare PPO | Attending: Physical Medicine and Rehabilitation | Admitting: Physical Medicine and Rehabilitation

## 2012-05-08 ENCOUNTER — Encounter: Payer: Self-pay | Admitting: Physical Medicine and Rehabilitation

## 2012-05-08 VITALS — BP 175/83 | HR 80 | Resp 16 | Ht 62.5 in | Wt 243.0 lb

## 2012-05-08 DIAGNOSIS — M545 Low back pain, unspecified: Secondary | ICD-10-CM

## 2012-05-08 DIAGNOSIS — R279 Unspecified lack of coordination: Secondary | ICD-10-CM | POA: Insufficient documentation

## 2012-05-08 DIAGNOSIS — J4489 Other specified chronic obstructive pulmonary disease: Secondary | ICD-10-CM | POA: Insufficient documentation

## 2012-05-08 DIAGNOSIS — G540 Brachial plexus disorders: Secondary | ICD-10-CM | POA: Insufficient documentation

## 2012-05-08 DIAGNOSIS — M25569 Pain in unspecified knee: Secondary | ICD-10-CM | POA: Insufficient documentation

## 2012-05-08 DIAGNOSIS — M25562 Pain in left knee: Secondary | ICD-10-CM

## 2012-05-08 DIAGNOSIS — M25561 Pain in right knee: Secondary | ICD-10-CM

## 2012-05-08 DIAGNOSIS — J449 Chronic obstructive pulmonary disease, unspecified: Secondary | ICD-10-CM | POA: Insufficient documentation

## 2012-05-08 MED ORDER — OXYCODONE-ACETAMINOPHEN 10-325 MG PO TABS: 1.0000 | ORAL_TABLET | Freq: Four times a day (QID) | ORAL | Status: DC

## 2012-05-08 NOTE — Progress Notes (Signed)
Subjective:    Patient ID: November Sypher, female    DOB: 07-30-1946, 66 y.o.   MRN: [id number]  HPI She is seen  [facility] at [facility] pain complaints. Today she complains mainly about LBP She has been recovering for over a year now from a right brachial plexus injury,  status post a fall. She had been followed by orthopedic surgeon, Dr. Rennis Chris for some time.  Her chronic pain complaints include right knee pain, low back pain, right arm pain and neck pain.  Problem has been stable. She reports that she has started a walking program.  Pain Inventory Average Pain 5 Pain Right Now 8 My pain is sharp, stabbing and aching  In the last 24 hours, has pain interfered with the following? General activity 5 Relation with others 5 Enjoyment of life 4 What TIME of day is your pain at its worst? daytime Sleep (in general) Fair  Pain is worse with: walking and standing Pain improves with: rest and medication Relief from Meds: 8  Mobility walk without assistance ability to climb steps?  no do you drive?  no  Function I need assistance with the following:  meal prep and household duties  Neuro/Psych weakness trouble walking  Prior Studies Any changes since last visit?  no  Physicians involved in your care Any changes since last visit?  no   History reviewed. No pertinent family history. History   Social History  . Marital Status: Widowed    Spouse Name: N/A    Number of Children: N/A  . Years of Education: N/A   Social History Main Topics  . Smoking status: Former Smoker -- 1.0 packs/day for 30 years    Types: Cigarettes    Quit date: 02/24/2012  . Smokeless tobacco: Never Used   Comment: was dx with COPD  . Alcohol Use: No  . Drug Use: No  . Sexually Active: None   Other Topics Concern  . None   Social History Narrative   Lives at home with her son. Still ambulatory without cane or walker   Past Surgical History  Procedure Date  . Back surgery      from bacteria  . Abdominal hysterectomy    Past Medical History  Diagnosis Date  . Cervicalgia   . Lumbago   . Pain in joint, hand   . Disturbance of skin sensation   . Polyneuropathy in diabetes   . Olecranon bursitis   . Chronic pain syndrome   . Diabetes mellitus   . Hypertension   . Hyperlipidemia   . CHF (congestive heart failure)   . Asthma   . COPD (chronic obstructive pulmonary disease)   . Blood transfusion    BP 175/83  Pulse 80  Resp 16  Ht 5' 2.5" (1.588 m)  Wt 243 lb (110.224 kg)  BMI 43.74 kg/m2  SpO2 100%      Review of Systems  Constitutional: Negative.   HENT: Negative.   Eyes: Negative.   Respiratory: Negative.   Cardiovascular: Negative.   Gastrointestinal: Negative.   Genitourinary: Negative.   Musculoskeletal: Positive for back pain.  Neurological: Negative.   Hematological: Negative.   Psychiatric/Behavioral: Negative.        Objective:   Physical Exam  Symmetric normal motor tone is noted throughout. Normal muscle bulk. Muscle testing reveals 5/5 muscle strength of the upper extremity, and 5/5 of the lower extremity. Full range of motion in upper and lower extremities. ROM of spine is not  restricted. Fine motor movements are slow in both hands.  DTR in the upper and lower extremity are present and symmetric 2+, except right UE trace. No clonus is noted.  Patient arises from chair without difficulty. Narrow based gait with normal arm swing bilateral , able to walk on heels and toes . Tandem walk is stable. No pronator drift. Rhomberg negative. Good finger to nose and heel to shin testing. No tremor, dystaxia or dysmetria noted.       Assessment & Plan:  1. Bilateral knee pain. Radiographs 2. History of chronic low back pain.  3. History of left groin debridement with significant tissue loss  remotely.  4. Status post fall June 12, 2010, with history of right shoulder  dislocation and subsequent brachial plexus injury with  residual  incoordination with fine motor movements. However, pain is  significantly improved over the last year.  5. COPD Her hands are not bothering her too much today, she states that she is doing some exercises for her hands and the symptoms have improved.

## 2012-05-08 NOTE — Patient Instructions (Addendum)
Continue with walking program, advised patient to walk in the pool if possible.Continue with medication.Advised patient to do some exercises in a sitting position at home.Advised patient to apply a heating pad if necessary.

## 2012-06-01 ENCOUNTER — Other Ambulatory Visit: Payer: Self-pay | Admitting: Physical Medicine and Rehabilitation

## 2012-06-08 ENCOUNTER — Encounter: Payer: Self-pay | Admitting: Physical Medicine and Rehabilitation

## 2012-06-08 ENCOUNTER — Encounter: Payer: Medicare HMO | Attending: Physical Medicine and Rehabilitation | Admitting: Physical Medicine and Rehabilitation

## 2012-06-08 VITALS — BP 160/78 | HR 74 | Resp 14 | Ht 62.0 in | Wt 237.0 lb

## 2012-06-08 DIAGNOSIS — M545 Low back pain, unspecified: Secondary | ICD-10-CM | POA: Insufficient documentation

## 2012-06-08 DIAGNOSIS — J4489 Other specified chronic obstructive pulmonary disease: Secondary | ICD-10-CM | POA: Insufficient documentation

## 2012-06-08 DIAGNOSIS — I10 Essential (primary) hypertension: Secondary | ICD-10-CM | POA: Insufficient documentation

## 2012-06-08 DIAGNOSIS — J449 Chronic obstructive pulmonary disease, unspecified: Secondary | ICD-10-CM | POA: Insufficient documentation

## 2012-06-08 DIAGNOSIS — M542 Cervicalgia: Secondary | ICD-10-CM | POA: Insufficient documentation

## 2012-06-08 DIAGNOSIS — M25519 Pain in unspecified shoulder: Secondary | ICD-10-CM

## 2012-06-08 DIAGNOSIS — M79604 Pain in right leg: Secondary | ICD-10-CM

## 2012-06-08 DIAGNOSIS — X58XXXS Exposure to other specified factors, sequela: Secondary | ICD-10-CM | POA: Insufficient documentation

## 2012-06-08 DIAGNOSIS — G8929 Other chronic pain: Secondary | ICD-10-CM | POA: Insufficient documentation

## 2012-06-08 DIAGNOSIS — E785 Hyperlipidemia, unspecified: Secondary | ICD-10-CM | POA: Insufficient documentation

## 2012-06-08 DIAGNOSIS — S4490XS Injury of unspecified nerve at shoulder and upper arm level, unspecified arm, sequela: Secondary | ICD-10-CM | POA: Insufficient documentation

## 2012-06-08 DIAGNOSIS — M25569 Pain in unspecified knee: Secondary | ICD-10-CM | POA: Insufficient documentation

## 2012-06-08 DIAGNOSIS — E119 Type 2 diabetes mellitus without complications: Secondary | ICD-10-CM | POA: Insufficient documentation

## 2012-06-08 DIAGNOSIS — M79609 Pain in unspecified limb: Secondary | ICD-10-CM | POA: Insufficient documentation

## 2012-06-08 MED ORDER — OXYCODONE-ACETAMINOPHEN 10-325 MG PO TABS: 1.0000 | ORAL_TABLET | Freq: Four times a day (QID) | ORAL | Status: DC

## 2012-06-08 NOTE — Patient Instructions (Signed)
Continue with your walking program, continue with training fine motor skills. Continue with medication.

## 2012-06-08 NOTE — Progress Notes (Signed)
Subjective:    Patient ID: Yvonne [name], female    DOB: 08/25/1946, 66 y.o.   MRN: [id number]  HPI [facility] is [facility] at [facility] pain complaints. Today she complains mainly about LBP She has been recovering for over a year now from a right brachial plexus injury,  status post a fall. Her chronic pain complaints include right knee pain, low back pain, right arm pain and neck pain.  Problem has been stable. She reports that she has started a walking program.  Pain Inventory Average Pain 9 Pain Right Now 9 My pain is burning and stabbing  In the last 24 hours, has pain interfered with the following? General activity 7 Relation with others 7 Enjoyment of life 7 What TIME of day is your pain at its worst? day, evening and night Sleep (in general) Fair  Pain is worse with: some activites Pain improves with: pacing activities and medication Relief from Meds: 5  Mobility walk without assistance how many minutes can you walk? 15 ability to climb steps?  no do you drive?  no needs help with transfers  Function I need assistance with the following:  dressing, bathing, household duties and shopping  Neuro/Psych numbness spasms depression anxiety  Prior Studies Any changes since last visit?  no  Physicians involved in your care Any changes since last visit?  no   History reviewed. No pertinent family history. History   Social History  . Marital Status: Widowed    Spouse Name: N/A    Number of Children: N/A  . Years of Education: N/A   Social History Main Topics  . Smoking status: Former Smoker -- 1.0 packs/day for 30 years    Types: Cigarettes    Quit date: 02/24/2012  . Smokeless tobacco: Never Used   Comment: was dx with COPD  . Alcohol Use: No  . Drug Use: No  . Sexually Active: None   Other Topics Concern  . None   Social History Narrative   Lives at home with her son. Still ambulatory without cane or walker   Past Surgical History    Procedure Date  . Back surgery     from bacteria  . Abdominal hysterectomy    Past Medical History  Diagnosis Date  . Cervicalgia   . Lumbago   . Pain in joint, hand   . Disturbance of skin sensation   . Polyneuropathy in diabetes   . Olecranon bursitis   . Chronic pain syndrome   . Diabetes mellitus   . Hypertension   . Hyperlipidemia   . CHF (congestive heart failure)   . Asthma   . COPD (chronic obstructive pulmonary disease)   . Blood transfusion    BP 160/78  Pulse 74  Resp 14  Ht 5\' 2"  (1.575 m)  Wt 237 lb (107.502 kg)  BMI 43.35 kg/m2  SpO2 100%     Review of Systems  Constitutional: Positive for diaphoresis.  Respiratory: Positive for apnea, shortness of breath and wheezing.   Gastrointestinal: Positive for nausea, vomiting and constipation.  Musculoskeletal: Positive for back pain and arthralgias.  Neurological: Positive for numbness.  Hematological: Bruises/bleeds easily.  All other systems reviewed and are negative.       Objective:   Physical Exam Symmetric normal motor tone is noted throughout. Normal muscle bulk. Muscle testing reveals 5/5 muscle strength of the upper extremity, and 5/5 of the lower extremity. Full range of motion in upper and lower extremities. ROM of  spine is not restricted. Fine motor movements are slow in both hands.  DTR in the upper and lower extremity are present and symmetric 2+, except right UE trace. No clonus is noted.  Patient arises from chair without difficulty. Narrow based gait with normal arm swing bilateral , able to walk on heels and toes . Tandem walk is stable. No pronator drift. Rhomberg negative.  Good finger to nose and heel to shin testing. No tremor, dystaxia or dysmetria noted.         Assessment & Plan:  1. Bilateral knee pain. Radiographs  2. History of chronic low back pain.  3. History of left groin debridement with significant tissue loss  remotely.  4. Status post fall June 12, 2010, with  history of right shoulder  dislocation and subsequent brachial plexus injury with residual  incoordination with fine motor movements. However, pain is  significantly improved over the last year.  5. COPD  Her hands are not bothering her too much today, she states that she is doing some exercises for her hands and the symptoms have improved. Refilled her Percocet 10mg . Follow up in 1 month.

## 2012-07-07 ENCOUNTER — Encounter: Payer: Medicare HMO | Attending: Physical Medicine and Rehabilitation | Admitting: Physical Medicine and Rehabilitation

## 2012-07-07 ENCOUNTER — Encounter: Payer: Self-pay | Admitting: Physical Medicine and Rehabilitation

## 2012-07-07 VITALS — BP 166/93 | HR 70 | Resp 16 | Ht 62.0 in | Wt 236.4 lb

## 2012-07-07 DIAGNOSIS — M25569 Pain in unspecified knee: Secondary | ICD-10-CM

## 2012-07-07 DIAGNOSIS — M1710 Unilateral primary osteoarthritis, unspecified knee: Secondary | ICD-10-CM | POA: Insufficient documentation

## 2012-07-07 DIAGNOSIS — M25561 Pain in right knee: Secondary | ICD-10-CM

## 2012-07-07 DIAGNOSIS — J449 Chronic obstructive pulmonary disease, unspecified: Secondary | ICD-10-CM | POA: Insufficient documentation

## 2012-07-07 DIAGNOSIS — J4489 Other specified chronic obstructive pulmonary disease: Secondary | ICD-10-CM | POA: Insufficient documentation

## 2012-07-07 DIAGNOSIS — G540 Brachial plexus disorders: Secondary | ICD-10-CM | POA: Insufficient documentation

## 2012-07-07 DIAGNOSIS — G8929 Other chronic pain: Secondary | ICD-10-CM

## 2012-07-07 MED ORDER — OXYCODONE-ACETAMINOPHEN 10-325 MG PO TABS: 1.0000 | ORAL_TABLET | Freq: Four times a day (QID) | ORAL | Status: DC

## 2012-07-07 MED ORDER — MELOXICAM 15 MG PO TABS: 15.0000 mg | ORAL_TABLET | Freq: Every day | ORAL | Status: DC

## 2012-07-07 NOTE — Patient Instructions (Signed)
Start with the Mobic, call us if you do not feel improvement in your knee after 2 weeks taking the mobic 1x a day. Apply ice or heat to your right knee, apply heat to your lower back.

## 2012-07-07 NOTE — Progress Notes (Signed)
Subjective:    Patient ID: Yvonne [name], female    DOB: 12-23-1945, 66 y.o.   MRN: [id number]  HPI She is [facility] at [facility] pain complaints. Today she complains mainly about right knee pain, which has kept her awake several nights. She also reports that she took 1 extra oxycodone 2-3 nights because of that. She has been recovering for over a year now from a right brachial plexus injury, status post a fall.  Her chronic pain complaints include right knee pain, low back pain, right arm pain and neck pain.  Problem has been stable, except her knee pain has increased, partially because of the damp weather.  Pain Inventory Average Pain 8 Pain Right Now 8 My pain is constant, burning, stabbing, tingling and aching  In the last 24 hours, has pain interfered with the following? General activity 5 Relation with others 5 Enjoyment of life 5 What TIME of day is your pain at its worst? morning and evening and night Sleep (in general) Poor  Pain is worse with: walking, bending, sitting, inactivity and standing Pain improves with: medication Relief from Meds: 5  Mobility walk with assistance how many minutes can you walk? 10 ability to climb steps?  no do you drive?  no needs help with transfers  Function I need assistance with the following:  dressing, bathing, household duties and shopping  Neuro/Psych weakness numbness trouble walking spasms depression anxiety  Prior Studies Any changes since last visit?  no  Physicians involved in your care Any changes since last visit?  no   History reviewed. No pertinent family history. History   Social History  . Marital Status: Widowed    Spouse Name: N/A    Number of Children: N/A  . Years of Education: N/A   Social History Main Topics  . Smoking status: Former Smoker -- 1.0 packs/day for 30 years    Types: Cigarettes    Quit date: 02/24/2012  . Smokeless tobacco: Never Used   Comment: was dx with  COPD  . Alcohol Use: No  . Drug Use: No  . Sexually Active: None   Other Topics Concern  . None   Social History Narrative   Lives at home with her son. Still ambulatory without cane or walker   Past Surgical History  Procedure Date  . Back surgery     from bacteria  . Abdominal hysterectomy    Past Medical History  Diagnosis Date  . Cervicalgia   . Lumbago   . Pain in joint, hand   . Disturbance of skin sensation   . Polyneuropathy in diabetes   . Olecranon bursitis   . Chronic pain syndrome   . Diabetes mellitus   . Hypertension   . Hyperlipidemia   . CHF (congestive heart failure)   . Asthma   . COPD (chronic obstructive pulmonary disease)   . Blood transfusion    BP 166/93  Pulse 70  Resp 16  Ht 5\' 2"  (1.575 m)  Wt 236 lb 6.4 oz (107.23 kg)  BMI 43.24 kg/m2  SpO2 97%    Review of Systems  Constitutional: Positive for diaphoresis and appetite change.  Respiratory: Positive for apnea and wheezing.   Cardiovascular: Positive for leg swelling.  Gastrointestinal: Positive for constipation.  Musculoskeletal: Positive for back pain, joint swelling and gait problem.       Spasms  Skin: Positive for color change and rash.       Has an area on  left leg (tibia area) that is darker than her normal skin tone and has some bumps on it. She does not know what it is but it hurts.  Neurological: Positive for weakness and numbness.  Psychiatric/Behavioral: Positive for dysphoric mood. The patient is nervous/anxious.   All other systems reviewed and are negative.       Objective:   Physical Exam  Constitutional: She is oriented to person, place, and time. She appears well-developed and well-nourished.       And looks disheveled with dark circles under her eyes  HENT:  Head: Normocephalic.  Neck: Neck supple.  Musculoskeletal: She exhibits tenderness.  Neurological: She is alert and oriented to person, place, and time.  Skin: Skin is warm and dry.  Psychiatric:  She has a normal mood and affect.   Symmetric normal motor tone is noted throughout. Normal muscle bulk. Muscle testing reveals 5/5 muscle strength of the upper extremity, and 5/5 of the lower extremity. Full range of motion in upper and lower extremities. ROM of spine is restricted. Fine motor movements are slow in both hands.  DTR in the upper and lower extremity are present and symmetric 2+, except right UE trace. No clonus is noted.  Patient arises from chair with mild difficulty. Wide based gait with normal arm swing bilateral , able to walk on heels and toes . Tandem walk is stable. No pronator drift. Rhomberg negative.           Assessment & Plan:  1. Bilateral knee pain, worse on the right. Radiographs showed mild to moderate osteo arthritis  2. History of chronic low back pain, consider muscle relaxants, to help with increased muscle pain at the next visit.  3. History of left groin debridement with significant tissue loss  remotely.  4. Status post fall June 12, 2010, with history of right shoulder  dislocation and subsequent brachial plexus injury with residual  incoordination with fine motor movements. However, pain is  significantly improved over the last year.  5. COPD  the patient complains about increased right knee pain, most likely because of the damp whether, prescribed Mobic for this inflammation, the right knee is just mildly swollen. Might consider aquatic therapy at the next visit.  Her hands are not bothering her too much today, she states that she is doing some exercises for her hands and the symptoms have improved.  Refilled her Percocet 10mg .  Follow up in 1 month.

## 2012-07-22 ENCOUNTER — Telehealth: Payer: Self-pay | Admitting: *Deleted

## 2012-07-22 ENCOUNTER — Other Ambulatory Visit: Payer: Self-pay | Admitting: Physical Medicine and Rehabilitation

## 2012-07-22 NOTE — Telephone Encounter (Signed)
Wants a call because the new medication that Clydie Braun started her on isn't working.  Spoke to pt and Mobic is the medication she is referring to. She states that Clydie Braun told her to call back if it didn't work and she would start her on a muscle relaxer. Please advise.

## 2012-07-23 MED ORDER — METHOCARBAMOL 500 MG PO TABS: 500.0000 mg | ORAL_TABLET | Freq: Three times a day (TID) | ORAL | Status: AC

## 2012-07-23 NOTE — Telephone Encounter (Signed)
She can get Robaxin for her increased LBP, with muscle spasms, 500mg  tid

## 2012-07-23 NOTE — Telephone Encounter (Signed)
Rx has been sent in, pt son is aware.

## 2012-08-04 ENCOUNTER — Encounter: Payer: Medicare HMO | Admitting: Physical Medicine and Rehabilitation

## 2012-08-06 ENCOUNTER — Telehealth: Payer: Self-pay

## 2012-08-06 NOTE — Telephone Encounter (Signed)
Octavio Graves called to confirm patient appointment

## 2012-08-07 ENCOUNTER — Encounter: Payer: Self-pay | Admitting: Physical Medicine and Rehabilitation

## 2012-08-07 ENCOUNTER — Encounter: Payer: Medicare HMO | Attending: Physical Medicine and Rehabilitation | Admitting: Physical Medicine and Rehabilitation

## 2012-08-07 VITALS — BP 138/44 | HR 76 | Resp 14 | Ht 62.0 in | Wt 226.6 lb

## 2012-08-07 DIAGNOSIS — M545 Low back pain, unspecified: Secondary | ICD-10-CM

## 2012-08-07 DIAGNOSIS — M25561 Pain in right knee: Secondary | ICD-10-CM

## 2012-08-07 DIAGNOSIS — J4489 Other specified chronic obstructive pulmonary disease: Secondary | ICD-10-CM | POA: Insufficient documentation

## 2012-08-07 DIAGNOSIS — J449 Chronic obstructive pulmonary disease, unspecified: Secondary | ICD-10-CM | POA: Insufficient documentation

## 2012-08-07 DIAGNOSIS — M1710 Unilateral primary osteoarthritis, unspecified knee: Secondary | ICD-10-CM | POA: Insufficient documentation

## 2012-08-07 DIAGNOSIS — G8929 Other chronic pain: Secondary | ICD-10-CM | POA: Insufficient documentation

## 2012-08-07 DIAGNOSIS — M25569 Pain in unspecified knee: Secondary | ICD-10-CM

## 2012-08-07 DIAGNOSIS — M25519 Pain in unspecified shoulder: Secondary | ICD-10-CM | POA: Insufficient documentation

## 2012-08-07 DIAGNOSIS — S4490XS Injury of unspecified nerve at shoulder and upper arm level, unspecified arm, sequela: Secondary | ICD-10-CM | POA: Insufficient documentation

## 2012-08-07 DIAGNOSIS — G894 Chronic pain syndrome: Secondary | ICD-10-CM

## 2012-08-07 DIAGNOSIS — X58XXXA Exposure to other specified factors, initial encounter: Secondary | ICD-10-CM | POA: Insufficient documentation

## 2012-08-07 MED ORDER — OXYCODONE-ACETAMINOPHEN 10-325 MG PO TABS: 1.0000 | ORAL_TABLET | Freq: Four times a day (QID) | ORAL | Status: DC

## 2012-08-07 NOTE — Patient Instructions (Signed)
Follow up with your PCP for your weight loss. Stay as active as tolerated , but do not overdo it.

## 2012-08-07 NOTE — Progress Notes (Signed)
Subjective:    Patient ID: Yvonne [name], female    DOB: 02/17/1946, 66 y.o.   MRN: [id number]  HPI She is seen here at [facility] for multiple pain complaints.  She has been recovering for over a year now from a right brachial plexus injury, status post a fall.  Her chronic pain complaints include right knee pain, low back pain, right arm pain and neck pain.  Problem has been stable, except she is complaining about increased pain/soreness in her right hand after she washed her kitchen wall.   Pain Inventory Average Pain 9 Pain Right Now 8 My pain is sharp and aching  In the last 24 hours, has pain interfered with the following? General activity 2 Relation with others 2 Enjoyment of life 2 What TIME of day is your pain at its worst? daytime evening and night Sleep (in general) Fair  Pain is worse with: walking, bending, sitting and standing Pain improves with: rest and medication Relief from Meds: 5  Mobility walk without assistance how many minutes can you walk? 10 ability to climb steps?  no do you drive?  no  Function I need assistance with the following:  dressing, bathing, meal prep, household duties and shopping  Neuro/Psych weakness numbness trouble walking depression  Prior Studies Any changes since last visit?  no  Physicians involved in your care Any changes since last visit?  no   History reviewed. No pertinent family history. History   Social History  . Marital Status: Widowed    Spouse Name: N/A    Number of Children: N/A  . Years of Education: N/A   Social History Main Topics  . Smoking status: Former Smoker -- 1.0 packs/day for 30 years    Types: Cigarettes    Quit date: 02/24/2012  . Smokeless tobacco: Never Used   Comment: was dx with COPD  . Alcohol Use: No  . Drug Use: No  . Sexually Active: None   Other Topics Concern  . None   Social History Narrative   Lives at home with her son. Still ambulatory without cane or walker     Past Surgical History  Procedure Date  . Back surgery     from bacteria  . Abdominal hysterectomy    Past Medical History  Diagnosis Date  . Cervicalgia   . Lumbago   . Pain in joint, hand   . Disturbance of skin sensation   . Polyneuropathy in diabetes   . Olecranon bursitis   . Chronic pain syndrome   . Diabetes mellitus   . Hypertension   . Hyperlipidemia   . CHF (congestive heart failure)   . Asthma   . COPD (chronic obstructive pulmonary disease)   . Blood transfusion    BP 138/44  Pulse 76  Resp 14  Ht 5\' 2"  (1.575 m)  Wt 226 lb 9.6 oz (102.785 kg)  BMI 41.45 kg/m2  SpO2 98%   Review of Systems  Constitutional: Positive for diaphoresis and appetite change.       10 lb weight loss in past month  Respiratory: Positive for shortness of breath and wheezing.   Gastrointestinal: Positive for diarrhea and constipation.  Musculoskeletal: Positive for back pain and gait problem.  Skin: Positive for rash.  Neurological: Positive for numbness.  Psychiatric/Behavioral: Positive for dysphoric mood.  All other systems reviewed and are negative.       Objective:   Physical Exam Constitutional: She is oriented to person, place, and time. She  appears well-developed and well-nourished.   HENT:  Head: Normocephalic.  Neck: Neck supple.  Musculoskeletal: She exhibits tenderness.  Neurological: She is alert and oriented to person, place, and time.  Skin: Skin is warm and dry.  Psychiatric: She has a normal mood and affect.   Symmetric normal motor tone is noted throughout. Normal muscle bulk. Muscle testing reveals 5/5 muscle strength of the upper extremity, and 5/5 of the lower extremity. Full range of motion in upper and lower extremities. ROM of spine is restricted. Fine motor movements are slow in both hands.  DTR in the upper and lower extremity are present and symmetric 2+, except right UE trace. No clonus is noted.  Patient arises from chair with mild  difficulty. Wide based gait with normal arm swing bilateral , able to walk on heels and toes . Tandem walk is stable. No pronator drift. Rhomberg negative.         Assessment & Plan:  1. Bilateral knee pain, worse on the right. Radiographs showed mild to moderate osteo arthritis  2. History of chronic low back pain, consider muscle relaxants, to help with increased muscle pain at the next visit.  3. History of left groin debridement with significant tissue loss  remotely.  4. Status post fall June 12, 2010, with history of right shoulder  dislocation and subsequent brachial plexus injury with residual  incoordination with fine motor movements. However, pain is  significantly improved over the last year.  5. COPD  the patient complains about increased right hand pain after washing a wall in her kitchen, I advised her to let her son, who lives with her, do this kind of strenuous housework. I advised her to do some exercises/stretches with her hand in warm water with epson salt.  Suggested  aquatic therapy for strengthening and pain relief, the patient wants to hold off on this, because she states, right now her pain is tolerable.  Patient has lost 10 lbs since her last visit,she states, that she did not have much appetite, I advised her to follow up with her PCP on this weight loss.  Refilled her Percocet 10mg .  Follow up in 1 month.

## 2012-09-04 ENCOUNTER — Other Ambulatory Visit: Payer: Self-pay | Admitting: Physical Medicine and Rehabilitation

## 2012-09-08 ENCOUNTER — Encounter
Payer: Medicare Other | Attending: Physical Medicine and Rehabilitation | Admitting: Physical Medicine and Rehabilitation

## 2012-09-08 ENCOUNTER — Encounter: Payer: Medicare HMO | Admitting: Physical Medicine and Rehabilitation

## 2012-09-08 ENCOUNTER — Encounter: Payer: Self-pay | Admitting: Physical Medicine and Rehabilitation

## 2012-09-08 VITALS — BP 142/78 | HR 93 | Resp 14 | Ht 62.0 in | Wt 237.0 lb

## 2012-09-08 DIAGNOSIS — Z5181 Encounter for therapeutic drug level monitoring: Secondary | ICD-10-CM | POA: Insufficient documentation

## 2012-09-08 DIAGNOSIS — G894 Chronic pain syndrome: Secondary | ICD-10-CM | POA: Insufficient documentation

## 2012-09-08 DIAGNOSIS — X58XXXS Exposure to other specified factors, sequela: Secondary | ICD-10-CM | POA: Insufficient documentation

## 2012-09-08 DIAGNOSIS — M2550 Pain in unspecified joint: Secondary | ICD-10-CM | POA: Insufficient documentation

## 2012-09-08 MED ORDER — OXYCODONE-ACETAMINOPHEN 10-325 MG PO TABS: 1.0000 | ORAL_TABLET | Freq: Four times a day (QID) | ORAL | Status: DC

## 2012-09-08 NOTE — Patient Instructions (Signed)
Try to stay as active as tolerated 

## 2012-09-08 NOTE — Progress Notes (Signed)
Subjective:    Patient ID: Yvonne [name], female    DOB: [birth year], 66 y.o.   MRN: [id number]  HPI She is seen here at [facility] for multiple pain complaints. She has been recovering for over a year now from a right brachial plexus injury, status post a fall.  Her chronic pain complaints include right knee pain, low back pain, right arm pain and neck pain.  Problem has been stable .  Pain Inventory Average Pain 7 Pain Right Now 8 My pain is burning, stabbing and aching  In the last 24 hours, has pain interfered with the following? General activity 3 Relation with others 3 Enjoyment of life 2 What TIME of day is your pain at its worst? daytime and night Sleep (in general) Fair  Pain is worse with: walking, bending and inactivity Pain improves with: medication Relief from Meds: 3  Mobility walk with assistance how many minutes can you walk? 15 ability to climb steps?  yes do you drive?  no  Function disabled: date disabled  retired I need assistance with the following:  dressing, bathing, household duties and shopping  Neuro/Psych weakness numbness depression  Prior Studies Any changes since last visit?  no  Physicians involved in your care Any changes since last visit?  no   History reviewed. No pertinent family history. History   Social History  . Marital Status: Widowed    Spouse Name: N/A    Number of Children: N/A  . Years of Education: N/A   Social History Main Topics  . Smoking status: Former Smoker -- 1.0 packs/day for 30 years    Types: Cigarettes    Quit date: 02/24/2012  . Smokeless tobacco: Never Used   Comment: was dx with COPD  . Alcohol Use: No  . Drug Use: No  . Sexually Active: None   Other Topics Concern  . None   Social History Narrative   Lives at home with her son. Still ambulatory without cane or walker   Past Surgical History  Procedure Date  . Back surgery     from bacteria  . Abdominal hysterectomy    Past  Medical History  Diagnosis Date  . Cervicalgia   . Lumbago   . Pain in joint, hand   . Disturbance of skin sensation   . Polyneuropathy in diabetes(357.2)   . Olecranon bursitis   . Chronic pain syndrome   . Diabetes mellitus   . Hypertension   . Hyperlipidemia   . CHF (congestive heart failure)   . Asthma   . COPD (chronic obstructive pulmonary disease)   . Blood transfusion    BP 142/78  Pulse 93  Resp 14  Ht 5\' 2"  (1.575 m)  Wt 237 lb (107.502 kg)  BMI 43.35 kg/m2  SpO2 99%   Review of Systems  Constitutional: Positive for appetite change.  Gastrointestinal: Positive for constipation.  Musculoskeletal: Positive for back pain and gait problem.  Neurological: Positive for weakness and numbness.  Psychiatric/Behavioral: Positive for dysphoric mood.  All other systems reviewed and are negative.       Objective:   Physical Exam  Constitutional: She is oriented to person, place, and time. She appears well-developed and well-nourished.   HENT:  Head: Normocephalic.  Neck: Neck supple.  Musculoskeletal: She exhibits tenderness.  Neurological: She is alert and oriented to person, place, and time.  Skin: Skin is warm and dry.  Psychiatric: She has a normal mood and affect.  Symmetric normal motor tone  is noted throughout. Normal muscle bulk. Muscle testing reveals 5/5 muscle strength of the upper extremity, and 5/5 of the lower extremity. Full range of motion in upper and lower extremities. ROM of spine is restricted. Fine motor movements are slow in both hands.  DTR in the upper and lower extremity are present and symmetric 2+, except right UE trace. No clonus is noted.  Patient arises from chair with mild difficulty. Wide based gait with normal arm swing bilateral , able to walk on heels and toes . Tandem walk is stable. No pronator drift. Rhomberg negative.        Assessment & Plan:  1. Bilateral knee pain, worse on the right. Radiographs showed mild to moderate  osteo arthritis  2. History of chronic low back pain, consider muscle relaxants, to help with increased muscle pain at the next visit.  3. History of left groin debridement with significant tissue loss  remotely.  4. Status post fall June 12, 2010, with history of right shoulder  dislocation and subsequent brachial plexus injury with residual  incoordination with fine motor movements. However, pain is  significantly improved over the last year.  5. COPD  the patient complains about increased right hand pain after washing a wall in her kitchen, I advised her to let her son, who lives with her, do this kind of strenuous housework. I advised her to do some exercises/stretches with her hand in warm water with epson salt.  Suggested aquatic therapy for strengthening and pain relief, the patient wants to hold off on this, because she states, right now her pain is tolerable.  Patient has lost 10 lbs since her last visit,she states, that she did not have much appetite, I advised her to follow up with her PCP on this weight loss.  Refilled her Percocet 10mg .  Follow up in 1 month.

## 2012-10-09 ENCOUNTER — Encounter: Payer: Self-pay | Admitting: Physical Medicine and Rehabilitation

## 2012-10-09 ENCOUNTER — Encounter: Payer: Medicare HMO | Attending: Physical Medicine and Rehabilitation | Admitting: Physical Medicine and Rehabilitation

## 2012-10-09 VITALS — BP 136/57 | HR 88 | Resp 16 | Ht 62.0 in | Wt 237.0 lb

## 2012-10-09 DIAGNOSIS — G894 Chronic pain syndrome: Secondary | ICD-10-CM

## 2012-10-09 DIAGNOSIS — G540 Brachial plexus disorders: Secondary | ICD-10-CM | POA: Insufficient documentation

## 2012-10-09 DIAGNOSIS — J4489 Other specified chronic obstructive pulmonary disease: Secondary | ICD-10-CM | POA: Insufficient documentation

## 2012-10-09 DIAGNOSIS — M1710 Unilateral primary osteoarthritis, unspecified knee: Secondary | ICD-10-CM | POA: Insufficient documentation

## 2012-10-09 DIAGNOSIS — M25569 Pain in unspecified knee: Secondary | ICD-10-CM | POA: Insufficient documentation

## 2012-10-09 DIAGNOSIS — J449 Chronic obstructive pulmonary disease, unspecified: Secondary | ICD-10-CM | POA: Insufficient documentation

## 2012-10-09 MED ORDER — OXYCODONE-ACETAMINOPHEN 10-325 MG PO TABS: 1.0000 | ORAL_TABLET | Freq: Four times a day (QID) | ORAL | Status: DC

## 2012-10-09 NOTE — Progress Notes (Signed)
Subjective:    Patient ID: Yvonne [name], female    DOB: 01/05/1946, 66 y.o.   MRN: [id number]  HPI She is seen here at [facility] for multiple pain complaints. She has been recovering for over a year now from a right brachial plexus injury, status post a fall.  Her chronic pain complaints include right knee pain, low back pain, right arm pain and neck pain.  Problem has been stable .  Pain Inventory Average Pain 10 Pain Right Now 9 My pain is constant, sharp and aching  In the last 24 hours, has pain interfered with the following? General activity 5 Relation with others 5 Enjoyment of life 5 What TIME of day is your pain at its worst? evening and night Sleep (in general) Poor  Pain is worse with: walking, bending, inactivity and standing Pain improves with: rest and medication Relief from Meds: 6  Mobility walk with assistance how many minutes can you walk? 10 ability to climb steps?  no do you drive?  no  Function disabled: date disabled 1998 I need assistance with the following:  dressing, bathing, toileting, meal prep, household duties and shopping  Neuro/Psych weakness numbness spasms confusion depression  Prior Studies Any changes since last visit?  no  Physicians involved in your care Any changes since last visit?  no   No family history on file. History   Social History  . Marital Status: Widowed    Spouse Name: N/A    Number of Children: N/A  . Years of Education: N/A   Social History Main Topics  . Smoking status: Former Smoker -- 1.0 packs/day for 30 years    Types: Cigarettes    Quit date: 02/24/2012  . Smokeless tobacco: Never Used     Comment: was dx with COPD  . Alcohol Use: No  . Drug Use: No  . Sexually Active: None   Other Topics Concern  . None   Social History Narrative   Lives at home with her son. Still ambulatory without cane or walker   Past Surgical History  Procedure Date  . Back surgery     from bacteria  .  Abdominal hysterectomy    Past Medical History  Diagnosis Date  . Cervicalgia   . Lumbago   . Pain in joint, hand   . Disturbance of skin sensation   . Polyneuropathy in diabetes(357.2)   . Olecranon bursitis   . Chronic pain syndrome   . Diabetes mellitus   . Hypertension   . Hyperlipidemia   . CHF (congestive heart failure)   . Asthma   . COPD (chronic obstructive pulmonary disease)   . Blood transfusion    BP 136/57  Pulse 88  Resp 16  Ht 5\' 2"  (1.575 m)  Wt 237 lb (107.502 kg)  BMI 43.35 kg/m2  SpO2 98%    Review of Systems  Constitutional: Positive for diaphoresis and unexpected weight change.  Respiratory: Positive for apnea, shortness of breath and wheezing.   Musculoskeletal: Positive for back pain.  Neurological: Positive for weakness and numbness.       Spasms  Psychiatric/Behavioral: Positive for confusion and dysphoric mood.  All other systems reviewed and are negative.       Objective:   Physical Exam Constitutional: She is oriented to person, place, and time. She appears well-developed and well-nourished.   HENT:  Head: Normocephalic.  Neck: Neck supple.  Musculoskeletal: She exhibits tenderness.  Neurological: She is alert and oriented to person, place,  and time.  Skin: Skin is warm and dry.  Psychiatric: She has a normal mood and affect.  Symmetric normal motor tone is noted throughout. Normal muscle bulk. Muscle testing reveals 5/5 muscle strength of the upper extremity, and 5/5 of the lower extremity. Full range of motion in upper and lower extremities. ROM of spine is restricted. Fine motor movements are slow in both hands.  DTR in the upper and lower extremity are present and symmetric 2+, except right UE trace. No clonus is noted.  Patient arises from chair with mild difficulty. Wide based gait with normal arm swing bilateral , able to walk on heels and toes . Tandem walk is stable. No pronator drift. Rhomberg negative.           Assessment & Plan:  1. Bilateral knee pain, worse on the right. Radiographs showed mild to moderate osteo arthritis  2. History of chronic low back pain, consider muscle relaxants, to help with increased muscle pain at the next visit.  3. History of left groin debridement with significant tissue loss  remotely.  4. Status post fall June 12, 2010, with history of right shoulder  dislocation and subsequent brachial plexus injury with residual  incoordination with fine motor movements. However, pain is  significantly improved over the last year.  5. COPD   Suggested aquatic therapy for strengthening and pain relief, the patient wants to hold off on this, because she states, right now her pain is tolerable.   Refilled her Percocet 10mg .  Follow up in 1 month.

## 2012-10-09 NOTE — Patient Instructions (Signed)
Try to stay as active as tolerated 

## 2012-10-25 ENCOUNTER — Other Ambulatory Visit: Payer: Self-pay | Admitting: Physical Medicine and Rehabilitation

## 2012-10-26 ENCOUNTER — Other Ambulatory Visit: Payer: Self-pay | Admitting: *Deleted

## 2012-10-26 MED ORDER — GABAPENTIN 400 MG PO CAPS: 400.0000 mg | ORAL_CAPSULE | ORAL | Status: DC

## 2012-11-05 ENCOUNTER — Encounter: Payer: Medicare HMO | Admitting: Physical Medicine and Rehabilitation

## 2012-11-05 ENCOUNTER — Telehealth: Payer: Self-pay | Admitting: *Deleted

## 2012-11-05 MED ORDER — OXYCODONE-ACETAMINOPHEN 10-325 MG PO TABS: 1.0000 | ORAL_TABLET | Freq: Four times a day (QID) | ORAL | Status: DC

## 2012-11-05 NOTE — Telephone Encounter (Signed)
Prescription printed for Yvonne [name] to sign.

## 2012-11-05 NOTE — Addendum Note (Signed)
Addended by: Claiborne Rigg D on: 11/05/2012 02:48 PM   Modules accepted: Orders

## 2012-11-05 NOTE — Telephone Encounter (Signed)
Patient had an appointment today at 10:30. Her Transportation, [employer], was running late. Patient was advised to reschedule her appointment. She will be out of her Oxycodone before her rescheduled appointment on Monday.

## 2012-11-09 ENCOUNTER — Encounter: Payer: Medicare HMO | Attending: Physical Medicine and Rehabilitation | Admitting: Physical Medicine and Rehabilitation

## 2012-12-08 ENCOUNTER — Encounter: Payer: Medicare HMO | Attending: Physical Medicine and Rehabilitation | Admitting: Physical Medicine and Rehabilitation

## 2012-12-08 ENCOUNTER — Encounter: Payer: Self-pay | Admitting: Physical Medicine and Rehabilitation

## 2012-12-08 VITALS — BP 144/79 | HR 89 | Resp 14 | Ht 62.0 in | Wt 237.0 lb

## 2012-12-08 DIAGNOSIS — M79601 Pain in right arm: Secondary | ICD-10-CM

## 2012-12-08 DIAGNOSIS — S143XXA Injury of brachial plexus, initial encounter: Secondary | ICD-10-CM | POA: Insufficient documentation

## 2012-12-08 DIAGNOSIS — M545 Low back pain, unspecified: Secondary | ICD-10-CM

## 2012-12-08 DIAGNOSIS — R279 Unspecified lack of coordination: Secondary | ICD-10-CM | POA: Insufficient documentation

## 2012-12-08 DIAGNOSIS — M1710 Unilateral primary osteoarthritis, unspecified knee: Secondary | ICD-10-CM | POA: Insufficient documentation

## 2012-12-08 DIAGNOSIS — G8929 Other chronic pain: Secondary | ICD-10-CM

## 2012-12-08 DIAGNOSIS — W19XXXA Unspecified fall, initial encounter: Secondary | ICD-10-CM | POA: Insufficient documentation

## 2012-12-08 DIAGNOSIS — R29898 Other symptoms and signs involving the musculoskeletal system: Secondary | ICD-10-CM | POA: Insufficient documentation

## 2012-12-08 DIAGNOSIS — M79602 Pain in left arm: Secondary | ICD-10-CM

## 2012-12-08 DIAGNOSIS — M79609 Pain in unspecified limb: Secondary | ICD-10-CM

## 2012-12-08 MED ORDER — OXYCODONE-ACETAMINOPHEN 10-325 MG PO TABS: 1.0000 | ORAL_TABLET | Freq: Four times a day (QID) | ORAL | Status: DC

## 2012-12-08 MED ORDER — GABAPENTIN 400 MG PO CAPS: 400.0000 mg | ORAL_CAPSULE | ORAL | Status: DC

## 2012-12-08 NOTE — Progress Notes (Signed)
Subjective:    Patient ID: Yvonne [name], female    DOB: 1946/02/26, 67 y.o.   MRN: [id number]  HPI She is seen here at [facility] for multiple pain complaints. She has been recovering for over a year now from a right brachial plexus injury, status post a fall.  Her chronic pain complaints include right knee pain, low back pain, right arm pain and neck pain.  Problem has been stable . The patient states, that she has lost her Gabapentin on a trip over the holidays.   Pain Inventory Average Pain 8 Pain Right Now 9 My pain is burning, stabbing, tingling and aching  In the last 24 hours, has pain interfered with the following? General activity 6 Relation with others 6 Enjoyment of life 6 What TIME of day is your pain at its worst? all Sleep (in general) Fair  Pain is worse with: walking, bending, sitting, inactivity, standing and some activites Pain improves with: rest and medication Relief from Meds: 9  Mobility walk without assistance how many minutes can you walk? 15 ability to climb steps?  no do you drive?  no  Function disabled: date disabled  I need assistance with the following:  dressing, bathing, meal prep, household duties and shopping  Neuro/Psych weakness numbness tingling trouble walking spasms confusion depression anxiety loss of taste or smell  Prior Studies Any changes since last visit?  no  Physicians involved in your care Any changes since last visit?  no   History reviewed. No pertinent family history. History   Social History  . Marital Status: Widowed    Spouse Name: N/A    Number of Children: N/A  . Years of Education: N/A   Social History Main Topics  . Smoking status: Former Smoker -- 1.0 packs/day for 30 years    Types: Cigarettes    Quit date: 02/24/2012  . Smokeless tobacco: Never Used     Comment: was dx with COPD  . Alcohol Use: No  . Drug Use: No  . Sexually Active: None   Other Topics Concern  . None    Social History Narrative   Lives at home with her son. Still ambulatory without cane or walker   Past Surgical History  Procedure Date  . Back surgery     from bacteria  . Abdominal hysterectomy    Past Medical History  Diagnosis Date  . Cervicalgia   . Lumbago   . Pain in joint, hand   . Disturbance of skin sensation   . Polyneuropathy in diabetes(357.2)   . Olecranon bursitis   . Chronic pain syndrome   . Diabetes mellitus   . Hypertension   . Hyperlipidemia   . CHF (congestive heart failure)   . Asthma   . COPD (chronic obstructive pulmonary disease)   . Blood transfusion    BP 144/79  Pulse 89  Resp 14  Ht 5\' 2"  (1.575 m)  Wt 237 lb (107.502 kg)  BMI 43.35 kg/m2  SpO2 98%   Review of Systems  Constitutional: Positive for diaphoresis and appetite change.  Respiratory: Positive for apnea, shortness of breath and wheezing.   Gastrointestinal: Positive for nausea and constipation.  Musculoskeletal: Positive for gait problem.       Spasm  Skin: Positive for rash.  Neurological: Positive for weakness and numbness.       Tingling  Psychiatric/Behavioral: Positive for dysphoric mood. The patient is nervous/anxious.   All other systems reviewed and are negative.  Objective:   Physical Exam Constitutional: She is oriented to person, place, and time. She appears well-developed and well-nourished.   HENT:  Head: Normocephalic.  Neck: Neck supple.  Musculoskeletal: She exhibits tenderness.  Neurological: She is alert and oriented to person, place, and time.  Skin: Skin is warm and dry.  Psychiatric: She has a normal mood and affect.  Symmetric normal motor tone is noted throughout. Normal muscle bulk. Muscle testing reveals 5/5 muscle strength of the upper extremity, and 5/5 of the lower extremity. Full range of motion in upper and lower extremities. ROM of spine is restricted. Fine motor movements are slow in both hands.  DTR in the upper and lower  extremity are present and symmetric 2+, except right UE trace. No clonus is noted.  Patient arises from chair with mild difficulty. Wide based gait with normal arm swing bilateral , able to walk on heels and toes . Tandem walk is stable. No pronator drift. Rhomberg negative.         Assessment & Plan:  1. Bilateral knee pain, worse on the right. Radiographs showed mild to moderate osteo arthritis  2. History of chronic low back pain, consider muscle relaxants, to help with increased muscle pain at the next visit.  3. History of left groin debridement with significant tissue loss  remotely.  4. Status post fall June 12, 2010, with history of right shoulder  dislocation and subsequent brachial plexus injury with residual  incoordination with fine motor movements. However, pain is  significantly improved over the last year.  5. COPD  Refilled her Oxycodone, and her Gabapentin, which she lost on a trip, it is ok to fill her Gabapentin early. Suggested aquatic therapy for strengthening and pain relief, the patient wants to hold off on this, because she states, right now her pain is tolerable.

## 2012-12-08 NOTE — Patient Instructions (Signed)
Try to walk as much as pain allows.

## 2012-12-17 ENCOUNTER — Other Ambulatory Visit: Payer: Self-pay | Admitting: Physical Medicine and Rehabilitation

## 2013-01-06 ENCOUNTER — Telehealth: Payer: Self-pay

## 2013-01-06 MED ORDER — OXYCODONE-ACETAMINOPHEN 10-325 MG PO TABS: 1.0000 | ORAL_TABLET | Freq: Four times a day (QID) | ORAL | Status: DC

## 2013-01-06 NOTE — Telephone Encounter (Signed)
Rx printed for refill. Reminded to please keep appointment.

## 2013-01-06 NOTE — Telephone Encounter (Signed)
Patients son called to get a refill on his mothers medication.  She will be out today and is worried about weather.

## 2013-01-08 ENCOUNTER — Encounter: Payer: Medicare HMO | Admitting: Physical Medicine and Rehabilitation

## 2013-02-01 ENCOUNTER — Other Ambulatory Visit: Payer: Self-pay | Admitting: Physical Medicine and Rehabilitation

## 2013-02-09 ENCOUNTER — Encounter: Payer: Self-pay | Admitting: Physical Medicine and Rehabilitation

## 2013-02-09 ENCOUNTER — Encounter: Payer: Medicare HMO | Attending: Physical Medicine and Rehabilitation | Admitting: Physical Medicine and Rehabilitation

## 2013-02-09 VITALS — BP 146/65 | HR 66 | Resp 14 | Ht 60.0 in | Wt 243.0 lb

## 2013-02-09 DIAGNOSIS — Z5181 Encounter for therapeutic drug level monitoring: Secondary | ICD-10-CM

## 2013-02-09 DIAGNOSIS — M545 Low back pain, unspecified: Secondary | ICD-10-CM

## 2013-02-09 DIAGNOSIS — M19049 Primary osteoarthritis, unspecified hand: Secondary | ICD-10-CM | POA: Insufficient documentation

## 2013-02-09 DIAGNOSIS — Z79899 Other long term (current) drug therapy: Secondary | ICD-10-CM

## 2013-02-09 DIAGNOSIS — J449 Chronic obstructive pulmonary disease, unspecified: Secondary | ICD-10-CM | POA: Insufficient documentation

## 2013-02-09 DIAGNOSIS — M1710 Unilateral primary osteoarthritis, unspecified knee: Secondary | ICD-10-CM | POA: Insufficient documentation

## 2013-02-09 DIAGNOSIS — S143XXA Injury of brachial plexus, initial encounter: Secondary | ICD-10-CM | POA: Insufficient documentation

## 2013-02-09 DIAGNOSIS — M79609 Pain in unspecified limb: Secondary | ICD-10-CM

## 2013-02-09 DIAGNOSIS — X58XXXA Exposure to other specified factors, initial encounter: Secondary | ICD-10-CM | POA: Insufficient documentation

## 2013-02-09 DIAGNOSIS — G8929 Other chronic pain: Secondary | ICD-10-CM

## 2013-02-09 DIAGNOSIS — R413 Other amnesia: Secondary | ICD-10-CM

## 2013-02-09 DIAGNOSIS — J4489 Other specified chronic obstructive pulmonary disease: Secondary | ICD-10-CM | POA: Insufficient documentation

## 2013-02-09 MED ORDER — OXYCODONE-ACETAMINOPHEN 10-325 MG PO TABS: 1.0000 | ORAL_TABLET | Freq: Four times a day (QID) | ORAL | Status: DC

## 2013-02-09 MED ORDER — GABAPENTIN 400 MG PO CAPS: 400.0000 mg | ORAL_CAPSULE | Freq: Four times a day (QID) | ORAL | Status: DC

## 2013-02-09 MED ORDER — DICLOFENAC SODIUM 1 % TD GEL: 2.0000 g | Freq: Four times a day (QID) | TRANSDERMAL | Status: DC

## 2013-02-09 NOTE — Patient Instructions (Signed)
Follow up with a Neurologist for your memory difficulties. If the insurance does not pay for your Voltaren gel, you can use Arnica cream for your hand and feet pain.

## 2013-02-09 NOTE — Progress Notes (Signed)
Subjective:    Patient ID: Yvonne [name], female    DOB: 1946-10-20, 67 y.o.   MRN: [id number]  HPI She is seen here at [facility] for multiple pain complaints. She has been recovering for over a year now from a right brachial plexus injury, status post a fall.  Her chronic pain complaints include right knee pain, low back pain, right arm pain and neck pain.  Problem has been stable .  Patient states, that she woke up today, and did not know what day it was, she felt very confused this morning. She also reports, that she had a similar episode 3 month ago, when she was at her PCP, and could not remember anything about her visit there. Pain Inventory Average Pain 8 Pain Right Now 8 My pain is constant, burning and aching  In the last 24 hours, has pain interfered with the following? General activity 6 Relation with others 6 Enjoyment of life 6 What TIME of day is your pain at its worst? night Sleep (in general) Fair  Pain is worse with: some activites Pain improves with: medication Relief from Meds: 7  Mobility walk without assistance how many minutes can you walk? 10-15 ability to climb steps?  no do you drive?  no  Function disabled: date disabled unknown I need assistance with the following:  meal prep, household duties and shopping  Neuro/Psych numbness trouble walking spasms  Prior Studies Any changes since last visit?  no  Physicians involved in your care Any changes since last visit?  no   History reviewed. No pertinent family history. History   Social History  . Marital Status: Widowed    Spouse Name: N/A    Number of Children: N/A  . Years of Education: N/A   Social History Main Topics  . Smoking status: Former Smoker -- 1.00 packs/day for 30 years    Types: Cigarettes    Quit date: 02/24/2012  . Smokeless tobacco: Never Used     Comment: was dx with COPD  . Alcohol Use: No  . Drug Use: No  . Sexually Active: None   Other Topics Concern   . None   Social History Narrative   Lives at home with her son. Still ambulatory without cane or walker   Past Surgical History  Procedure Laterality Date  . Back surgery      from bacteria  . Abdominal hysterectomy     Past Medical History  Diagnosis Date  . Cervicalgia   . Lumbago   . Pain in joint, hand   . Disturbance of skin sensation   . Polyneuropathy in diabetes(357.2)   . Olecranon bursitis   . Chronic pain syndrome   . Diabetes mellitus   . Hypertension   . Hyperlipidemia   . CHF (congestive heart failure)   . Asthma   . COPD (chronic obstructive pulmonary disease)   . Blood transfusion    BP 146/65  Pulse 66  Resp 14  Ht 5' (1.524 m)  Wt 243 lb (110.224 kg)  BMI 47.46 kg/m2  SpO2 97%     Review of Systems  Musculoskeletal: Positive for back pain and gait problem.  Neurological: Positive for numbness.  All other systems reviewed and are negative.       Objective:   Physical Exam Constitutional: She is oriented to person, place, and time. She appears well-developed and well-nourished.   HENT:  Head: Normocephalic.  Neck: Neck supple.  Musculoskeletal: She exhibits tenderness.  Neurological: She  is alert and oriented to person, place, and time.  Skin: Skin is warm and dry.  Psychiatric: She has a normal mood and affect.  Symmetric normal motor tone is noted throughout. Normal muscle bulk. Muscle testing reveals 5/5 muscle strength of the upper extremity, and 5/5 of the lower extremity. Full range of motion in upper and lower extremities. ROM of spine is restricted. Fine motor movements are slow in both hands.  DTR in the upper and lower extremity are present and symmetric 2+, except right UE trace. No clonus is noted.  Patient arises from chair with mild difficulty. Wide based gait with normal arm swing bilateral , able to walk on heels and toes . Tandem walk is stable. No pronator drift. Rhomberg negative. Swelling of MCP joints 2-4,  bilateral       Assessment & Plan:  1. Bilateral knee pain, worse on the right. Radiographs showed mild to moderate osteo arthritis  2. History of chronic low back pain, consider muscle relaxants, to help with increased muscle pain at the next visit.  3. History of left groin debridement with significant tissue loss  remotely.  4. Status post fall June 12, 2010, with history of right shoulder  dislocation and subsequent brachial plexus injury with residual  incoordination with fine motor movements. However, pain is  significantly improved over the last year.  5. COPD  6. 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 7. Memory difficulties, patient stated, that she woke up today, and did not know what day it was, she felt very confused this morning. She also reported, that she had a similar episode 3 month ago, when she was at her PCP, and could not remember anything about her visit there. I referred her to Neurology, for an evaluation and work up for her memory difficulties. Patient was short of her medication for 4 days at least, she has 2 UDSs where everything shows up negative, it was consistent, because the patient had taken her last medication 2-3 days before the UDS. She will follow up in 27 days, so that we can do another UDS, while she is still taking her medications. If this UDS comes back negative for her Oxycodone we will not prescribe it anymore.  Refilled her Oxycodone, Voltaren gel and her Gabapentin.  Suggested aquatic therapy for strengthening and pain relief, the patient wants to hold off on this, because she states, right now her pain is tolerable.

## 2013-03-08 ENCOUNTER — Encounter: Payer: Self-pay | Admitting: Physical Medicine and Rehabilitation

## 2013-03-08 ENCOUNTER — Encounter: Payer: Medicare HMO | Attending: Physical Medicine and Rehabilitation | Admitting: Physical Medicine and Rehabilitation

## 2013-03-08 VITALS — BP 142/84 | HR 86 | Resp 16 | Ht 62.0 in | Wt 257.0 lb

## 2013-03-08 DIAGNOSIS — W19XXXA Unspecified fall, initial encounter: Secondary | ICD-10-CM | POA: Insufficient documentation

## 2013-03-08 DIAGNOSIS — M549 Dorsalgia, unspecified: Secondary | ICD-10-CM | POA: Insufficient documentation

## 2013-03-08 DIAGNOSIS — Z79899 Other long term (current) drug therapy: Secondary | ICD-10-CM | POA: Insufficient documentation

## 2013-03-08 DIAGNOSIS — G8929 Other chronic pain: Secondary | ICD-10-CM

## 2013-03-08 DIAGNOSIS — Z5181 Encounter for therapeutic drug level monitoring: Secondary | ICD-10-CM | POA: Insufficient documentation

## 2013-03-08 DIAGNOSIS — M542 Cervicalgia: Secondary | ICD-10-CM

## 2013-03-08 MED ORDER — OXYCODONE-ACETAMINOPHEN 10-325 MG PO TABS: 1.0000 | ORAL_TABLET | Freq: Four times a day (QID) | ORAL | Status: DC

## 2013-03-08 NOTE — Progress Notes (Signed)
Subjective:    Patient ID: Yvonne [name], female    DOB: August 01, 1946, 67 y.o.   MRN: [id number]  HPI She is seen here at [facility] for multiple pain complaints. She has been recovering for over a year now from a right brachial plexus injury, status post a fall.  Her chronic pain complaints include right knee pain, low back pain, right arm pain and neck pain.  Problem has been stable .  Patient states, at her last visit, that she woke up, and did not know what day it was, she felt very confused that morning. She also reports, that she had a similar episode 3 month ago, when she was at her PCP, and could not remember anything about her visit there.I referred her to neurology for an evaluation of her memory difficulties at the last visit, she did not hear from the neurologist yet.  Pain Inventory Average Pain 9 Pain Right Now 9 My pain is sharp, burning, tingling and aching  In the last 24 hours, has pain interfered with the following? General activity 5 Relation with others 4 Enjoyment of life 6 What TIME of day is your pain at its worst? varies Sleep (in general) Poor  Pain is worse with: walking, bending and standing Pain improves with: rest and medication Relief from Meds: 8  Mobility walk without assistance ability to climb steps?  no do you drive?  no needs help with transfers  Function not employed: date last employed . disabled: date disabled . I need assistance with the following:  dressing, bathing, meal prep and shopping  Neuro/Psych weakness numbness trouble walking  Prior Studies Any changes since last visit?  no  Physicians involved in your care Any changes since last visit?  no   History reviewed. No pertinent family history. History   Social History  . Marital Status: Widowed    Spouse Name: N/A    Number of Children: N/A  . Years of Education: N/A   Social History Main Topics  . Smoking status: Current Some Day Smoker -- 1.00 packs/day for  30 years    Types: Cigarettes    Last Attempt to Quit: 02/24/2012  . Smokeless tobacco: Never Used     Comment: was dx with COPD  . Alcohol Use: No  . Drug Use: No  . Sexually Active: None   Other Topics Concern  . None   Social History Narrative   Lives at home with her son. Still ambulatory without cane or walker   Past Surgical History  Procedure Laterality Date  . Back surgery      from bacteria  . Abdominal hysterectomy     Past Medical History  Diagnosis Date  . Cervicalgia   . Lumbago   . Pain in joint, hand   . Disturbance of skin sensation   . Polyneuropathy in diabetes(357.2)   . Olecranon bursitis   . Chronic pain syndrome   . Diabetes mellitus   . Hypertension   . Hyperlipidemia   . CHF (congestive heart failure)   . Asthma   . COPD (chronic obstructive pulmonary disease)   . Blood transfusion    BP 142/84  Pulse 86  Resp 16  Ht 5\' 2"  (1.575 m)  Wt 257 lb (116.574 kg)  BMI 46.99 kg/m2  SpO2 91%     Review of Systems  Musculoskeletal: Positive for gait problem.  Neurological: Positive for weakness and numbness.  All other systems reviewed and are negative.  Objective:   Physical Exam Constitutional: She is oriented to person, place, and time. She appears well-developed and well-nourished.   HENT:  Head: Normocephalic.  Neck: Neck supple.  Musculoskeletal: She exhibits tenderness.  Neurological: She is alert and oriented to person, place, and time.  Skin: Skin is warm and dry.  Psychiatric: She has a normal mood and affect.  Symmetric normal motor tone is noted throughout. Normal muscle bulk. Muscle testing reveals 5/5 muscle strength of the upper extremity, and 5/5 of the lower extremity. Full range of motion in upper and lower extremities. ROM of spine is restricted. Fine motor movements are slow in both hands.  DTR in the upper and lower extremity are present and symmetric 2+, except right UE trace. No clonus is noted.  Patient  arises from chair with mild difficulty. Wide based gait with normal arm swing bilateral , able to walk on heels and toes . Tandem walk is stable. No pronator drift. Rhomberg negative.  Swelling of MCP joints 2-4, bilateral        Assessment & Plan:  1. Bilateral knee pain, worse on the right. Radiographs showed mild to moderate osteo arthritis  2. History of chronic low back pain, consider muscle relaxants, to help with increased muscle pain at the next visit.  3. History of left groin debridement with significant tissue loss  remotely.  4. Status post fall June 12, 2010, with history of right shoulder  dislocation and subsequent brachial plexus injury with residual  incoordination with fine motor movements. However, pain is  significantly improved over the last year.  5. COPD  6. 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  7. Memory difficulties, patient stated, that she woke up today, and did not know what day it was, she felt very confused this morning. She also reported, that she had a similar episode 3 month ago, when she was at her PCP, and could not remember anything about her visit there. I referred her to Neurology, for an evaluation and work up for her memory difficulties, she has not heard from them, today Yvonne [name] is calling their office, to set up an appointment.  Patient was short of her medication for 4 days at least, she has 2 UDSs where everything shows up negative, it was consistent, because the patient had taken her last medication 2-3 days before the UDS. She is following up in 27 days, so that we can do another UDS, while she is still taking her medications. If this UDS comes back negative for her Oxycodone we will not prescribe it anymore.  Refilled her Oxycodone. Also advised patient to follow up on her elevated BP, and swelling of her legs bilateral, with her PCP.   Suggested aquatic therapy for strengthening and pain relief, the patient wants to  hold off on this, because she states, right now her pain is tolerable.  Follow up in 1 month

## 2013-03-08 NOTE — Patient Instructions (Addendum)
Stay as active as tolerated. Follow up with your PCP for your elevated blood pressure and your swelling in your legs.

## 2013-03-17 ENCOUNTER — Ambulatory Visit: Payer: Self-pay | Admitting: Diagnostic Neuroimaging

## 2013-03-17 ENCOUNTER — Telehealth: Payer: Self-pay | Admitting: *Deleted

## 2013-03-17 NOTE — Telephone Encounter (Signed)
Called and got voicemail. Left message to [facility] so that we can ask about how Tramadol appears in her UDS

## 2013-03-19 NOTE — Telephone Encounter (Signed)
2nd unsuccessful attempt to reach patient to ask about tramadol in her urine. No answer, vm left to [facility]

## 2013-03-29 NOTE — Telephone Encounter (Signed)
Mrs Muntean has not returned our call.

## 2013-04-05 ENCOUNTER — Encounter: Payer: PRIVATE HEALTH INSURANCE | Admitting: Physical Medicine and Rehabilitation

## 2013-04-08 ENCOUNTER — Telehealth: Payer: Self-pay

## 2013-04-08 NOTE — Telephone Encounter (Signed)
Patients son called requesting refill.  Says patient will be out before appointment.  New earlier appointment has been made.  Patient aware.

## 2013-04-09 ENCOUNTER — Encounter
Payer: Medicare Other | Attending: Physical Medicine and Rehabilitation | Admitting: Physical Medicine and Rehabilitation

## 2013-04-09 ENCOUNTER — Encounter: Payer: Self-pay | Admitting: Physical Medicine and Rehabilitation

## 2013-04-09 VITALS — BP 152/75 | HR 72 | Resp 16 | Ht 62.5 in | Wt 244.0 lb

## 2013-04-09 DIAGNOSIS — S4490XS Injury of unspecified nerve at shoulder and upper arm level, unspecified arm, sequela: Secondary | ICD-10-CM | POA: Insufficient documentation

## 2013-04-09 DIAGNOSIS — J4489 Other specified chronic obstructive pulmonary disease: Secondary | ICD-10-CM | POA: Insufficient documentation

## 2013-04-09 DIAGNOSIS — R413 Other amnesia: Secondary | ICD-10-CM | POA: Insufficient documentation

## 2013-04-09 DIAGNOSIS — M25569 Pain in unspecified knee: Secondary | ICD-10-CM | POA: Insufficient documentation

## 2013-04-09 DIAGNOSIS — M1710 Unilateral primary osteoarthritis, unspecified knee: Secondary | ICD-10-CM | POA: Insufficient documentation

## 2013-04-09 DIAGNOSIS — M7989 Other specified soft tissue disorders: Secondary | ICD-10-CM | POA: Insufficient documentation

## 2013-04-09 DIAGNOSIS — E785 Hyperlipidemia, unspecified: Secondary | ICD-10-CM | POA: Insufficient documentation

## 2013-04-09 DIAGNOSIS — I10 Essential (primary) hypertension: Secondary | ICD-10-CM | POA: Insufficient documentation

## 2013-04-09 DIAGNOSIS — M549 Dorsalgia, unspecified: Secondary | ICD-10-CM

## 2013-04-09 DIAGNOSIS — J449 Chronic obstructive pulmonary disease, unspecified: Secondary | ICD-10-CM | POA: Insufficient documentation

## 2013-04-09 DIAGNOSIS — Z79899 Other long term (current) drug therapy: Secondary | ICD-10-CM

## 2013-04-09 DIAGNOSIS — R279 Unspecified lack of coordination: Secondary | ICD-10-CM | POA: Insufficient documentation

## 2013-04-09 DIAGNOSIS — G8929 Other chronic pain: Secondary | ICD-10-CM

## 2013-04-09 DIAGNOSIS — W19XXXS Unspecified fall, sequela: Secondary | ICD-10-CM | POA: Insufficient documentation

## 2013-04-09 DIAGNOSIS — E119 Type 2 diabetes mellitus without complications: Secondary | ICD-10-CM | POA: Insufficient documentation

## 2013-04-09 DIAGNOSIS — M542 Cervicalgia: Secondary | ICD-10-CM

## 2013-04-09 DIAGNOSIS — M545 Low back pain, unspecified: Secondary | ICD-10-CM | POA: Insufficient documentation

## 2013-04-09 DIAGNOSIS — F17200 Nicotine dependence, unspecified, uncomplicated: Secondary | ICD-10-CM | POA: Insufficient documentation

## 2013-04-09 DIAGNOSIS — M19049 Primary osteoarthritis, unspecified hand: Secondary | ICD-10-CM | POA: Insufficient documentation

## 2013-04-09 DIAGNOSIS — Z5181 Encounter for therapeutic drug level monitoring: Secondary | ICD-10-CM

## 2013-04-09 MED ORDER — OXYCODONE-ACETAMINOPHEN 10-325 MG PO TABS: 1.0000 | ORAL_TABLET | Freq: Four times a day (QID) | ORAL | Status: DC

## 2013-04-09 NOTE — Patient Instructions (Signed)
Let your PCP evaluate your mildly swollen left lower leg, if it gets more swollen, increased in temperature , red or painful go to your PCP or urgent care asap.

## 2013-04-09 NOTE — Progress Notes (Signed)
Subjective:    Patient ID: Yvonne [name], female    DOB: [birth year], 67 y.o.   MRN: [id number]  HPI She is seen here at [facility] for multiple pain complaints. She has been recovering for over a year now from a right brachial plexus injury, status post a fall.  Her chronic pain complaints include right knee pain, low back pain, right arm pain and neck pain.  Problem has been stable .  Patient states, at her last visit, that she woke up, and did not know what day it was, she felt very confused one morning. She also reports, that she had a similar episode 3 month ago, when she was at her PCP, and could not remember anything about her visit there.I referred her to neurology for an evaluation of her memory difficulties at the last visit, she has an appointment in June.   Pain Inventory Average Pain 8 Pain Right Now 7 My pain is sharp and aching  In the last 24 hours, has pain interfered with the following? General activity 7 Relation with others 7 Enjoyment of life 7 What TIME of day is your pain at its worst? evening Sleep (in general) Poor  Pain is worse with: bending, inactivity and standing Pain improves with: medication Relief from Meds: 5  Mobility walk without assistance how many minutes can you walk? 20 ability to climb steps?  no do you drive?  no needs help with transfers  Function retired I need assistance with the following:  dressing, bathing, meal prep and shopping  Neuro/Psych weakness trouble walking  Prior Studies Any changes since last visit?  no  Physicians involved in your care Any changes since last visit?  no   History reviewed. No pertinent family history. History   Social History  . Marital Status: Widowed    Spouse Name: N/A    Number of Children: N/A  . Years of Education: N/A   Social History Main Topics  . Smoking status: Current Some Day Smoker -- 1.00 packs/day for 30 years    Types: Cigarettes    Last Attempt to Quit:  02/24/2012  . Smokeless tobacco: Never Used     Comment: was dx with COPD  . Alcohol Use: No  . Drug Use: No  . Sexually Active: None   Other Topics Concern  . None   Social History Narrative   Lives at home with her son. Still ambulatory without cane or walker   Past Surgical History  Procedure Laterality Date  . Back surgery      from bacteria  . Abdominal hysterectomy     Past Medical History  Diagnosis Date  . Cervicalgia   . Lumbago   . Pain in joint, hand   . Disturbance of skin sensation   . Polyneuropathy in diabetes(357.2)   . Olecranon bursitis   . Chronic pain syndrome   . Diabetes mellitus   . Hypertension   . Hyperlipidemia   . CHF (congestive heart failure)   . Asthma   . COPD (chronic obstructive pulmonary disease)   . Blood transfusion    BP 152/75  Pulse 72  Resp 16  Ht 5' 2.5" (1.588 m)  Wt 244 lb (110.678 kg)  BMI 43.89 kg/m2  SpO2 97%     Review of Systems  Constitutional: Positive for diaphoresis, appetite change and unexpected weight change.  Respiratory: Positive for apnea.   Cardiovascular: Positive for leg swelling.  Gastrointestinal: Positive for nausea, vomiting and abdominal pain.  Musculoskeletal: Positive for back pain and gait problem.  Neurological: Positive for weakness.  All other systems reviewed and are negative.       Objective:   Physical Exam Constitutional: She is oriented to person, place, and time. She appears well-developed and well-nourished.   HENT:  Head: Normocephalic.  Neck: Neck supple.  Musculoskeletal: She exhibits tenderness.  Neurological: She is alert and oriented to person, place, and time.  Skin: Skin is warm and dry.Mild swelling of left lower le, NO redness, increased temperature or pain  Psychiatric: She has a normal mood and affect.  Symmetric normal motor tone is noted throughout. Normal muscle bulk. Muscle testing reveals 5/5 muscle strength of the upper extremity, and 5/5 of the lower  extremity. Full range of motion in upper and lower extremities. ROM of spine is restricted. Fine motor movements are slow in both hands.  DTR in the upper and lower extremity are present and symmetric 2+, except right UE trace. No clonus is noted.  Patient arises from chair with mild difficulty. Wide based gait with normal arm swing bilateral , able to walk on heels and toes . Tandem walk is stable. No pronator drift. Rhomberg negative.  Swelling of MCP joints 2-4, bilateral        Assessment & Plan:  1. Bilateral knee pain, worse on the right. Radiographs showed mild to moderate osteo arthritis  2. History of chronic low back pain, consider muscle relaxants, to help with increased muscle pain at the next visit.  3. History of left groin debridement with significant tissue loss  remotely.  4. Status post fall June 12, 2010, with history of right shoulder  dislocation and subsequent brachial plexus injury with residual  incoordination with fine motor movements. However, pain is  significantly improved over the last year.  5. COPD  6. 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 7. Mild swelling of left lower leg, NO redness, increased temperature or pain. Advised patient to follow up with her PCP asap, if it gets worse over the weekend, she should go to urgent care or the ED  8. Memory difficulties, patient stated, that she woke up today, and did not know what day it was, she felt very confused one morning. She also reported, that she had a similar episode 3 month ago, when she was at her PCP, and could not remember anything about her visit there. I referred her to Neurology, for an evaluation and work up for her memory difficulties, she has an appointment in June.   Patient was short of her medication for 4 days at least, she has 2 UDSs where everything shows up negative, it was consistent, because the patient had taken her last medication 2-3 days before the UDS. She was  supposed to follow up in 27 days, after the last visit, but canceled her appointment because of being out of town, because a family member was sick. We can did another UDS today.  Refilled her Oxycodone.   Suggested aquatic therapy for strengthening and pain relief, the patient wants to hold off on this, because she states, right now her pain is tolerable.  Follow up in 1 month

## 2013-04-12 ENCOUNTER — Encounter: Payer: PRIVATE HEALTH INSURANCE | Admitting: Physical Medicine and Rehabilitation

## 2013-04-28 ENCOUNTER — Ambulatory Visit: Payer: Medicare HMO | Admitting: Diagnostic Neuroimaging

## 2013-05-06 ENCOUNTER — Other Ambulatory Visit: Payer: Self-pay | Admitting: Physical Medicine and Rehabilitation

## 2013-05-07 ENCOUNTER — Encounter: Payer: Self-pay | Admitting: Physical Medicine and Rehabilitation

## 2013-05-07 ENCOUNTER — Encounter
Payer: Medicare Other | Attending: Physical Medicine and Rehabilitation | Admitting: Physical Medicine and Rehabilitation

## 2013-05-07 VITALS — BP 154/78 | HR 100 | Resp 16 | Ht 62.5 in | Wt 242.0 lb

## 2013-05-07 DIAGNOSIS — M19049 Primary osteoarthritis, unspecified hand: Secondary | ICD-10-CM | POA: Insufficient documentation

## 2013-05-07 DIAGNOSIS — M1990 Unspecified osteoarthritis, unspecified site: Secondary | ICD-10-CM

## 2013-05-07 DIAGNOSIS — M545 Low back pain, unspecified: Secondary | ICD-10-CM | POA: Insufficient documentation

## 2013-05-07 DIAGNOSIS — Z79899 Other long term (current) drug therapy: Secondary | ICD-10-CM | POA: Insufficient documentation

## 2013-05-07 DIAGNOSIS — G8929 Other chronic pain: Secondary | ICD-10-CM | POA: Insufficient documentation

## 2013-05-07 DIAGNOSIS — M1710 Unilateral primary osteoarthritis, unspecified knee: Secondary | ICD-10-CM | POA: Insufficient documentation

## 2013-05-07 DIAGNOSIS — J449 Chronic obstructive pulmonary disease, unspecified: Secondary | ICD-10-CM | POA: Insufficient documentation

## 2013-05-07 DIAGNOSIS — M542 Cervicalgia: Secondary | ICD-10-CM

## 2013-05-07 DIAGNOSIS — R413 Other amnesia: Secondary | ICD-10-CM | POA: Insufficient documentation

## 2013-05-07 DIAGNOSIS — M7989 Other specified soft tissue disorders: Secondary | ICD-10-CM | POA: Insufficient documentation

## 2013-05-07 DIAGNOSIS — IMO0002 Reserved for concepts with insufficient information to code with codable children: Secondary | ICD-10-CM | POA: Insufficient documentation

## 2013-05-07 DIAGNOSIS — E119 Type 2 diabetes mellitus without complications: Secondary | ICD-10-CM | POA: Insufficient documentation

## 2013-05-07 DIAGNOSIS — J4489 Other specified chronic obstructive pulmonary disease: Secondary | ICD-10-CM | POA: Insufficient documentation

## 2013-05-07 MED ORDER — MELOXICAM 15 MG PO TABS: 15.0000 mg | ORAL_TABLET | Freq: Every day | ORAL | Status: AC

## 2013-05-07 MED ORDER — OXYCODONE-ACETAMINOPHEN 10-325 MG PO TABS: 1.0000 | ORAL_TABLET | Freq: Four times a day (QID) | ORAL | Status: DC

## 2013-05-07 NOTE — Progress Notes (Signed)
Subjective:    Patient ID: Yvonne [name], female    DOB: May 14, 1946, 67 y.o.   MRN: [id number]  HPI She is seen here at [facility] for multiple pain complaints. She has been recovering for over a year now from a right brachial plexus injury, status post a fall.  Her chronic pain complaints include right knee pain, low back pain, right arm pain and neck pain.  Problem has been stable .  Patient states, at her last visit, that she woke up, and did not know what day it was, she felt very confused one morning. She also reports, that she had a similar episode 3 month ago, when she was at her PCP, and could not remember anything about her visit there.I referred her to neurology for an evaluation of her memory difficulties at the last visit, she had an appointment in June, she cancelled that appointment, she will schedule a new one.   Pain Inventory Average Pain 7 Pain Right Now 8 My pain is sharp, stabbing, tingling and aching  In the last 24 hours, has pain interfered with the following? General activity 6 Relation with others 6 Enjoyment of life 6 What TIME of day is your pain at its worst? evening and night Sleep (in general) Fair  Pain is worse with: walking, bending, standing and some activites Pain improves with: rest and medication Relief from Meds: 8  Mobility walk without assistance ability to climb steps?  no  Function disabled: date disabled . I need assistance with the following:  dressing, meal prep, household duties and shopping  Neuro/Psych No problems in this area  Prior Studies Any changes since last visit?  no  Physicians involved in your care Any changes since last visit?  no   History reviewed. No pertinent family history. History   Social History  . Marital Status: Widowed    Spouse Name: N/A    Number of Children: N/A  . Years of Education: N/A   Social History Main Topics  . Smoking status: Current Some Day Smoker -- 1.00 packs/day for 30  years    Types: Cigarettes    Last Attempt to Quit: 02/24/2012  . Smokeless tobacco: Never Used     Comment: was dx with COPD  . Alcohol Use: No  . Drug Use: No  . Sexually Active: None   Other Topics Concern  . None   Social History Narrative   Lives at home with her son. Still ambulatory without cane or walker   Past Surgical History  Procedure Laterality Date  . Back surgery      from bacteria  . Abdominal hysterectomy     Past Medical History  Diagnosis Date  . Cervicalgia   . Lumbago   . Pain in joint, hand   . Disturbance of skin sensation   . Polyneuropathy in diabetes(357.2)   . Olecranon bursitis   . Chronic pain syndrome   . Diabetes mellitus   . Hypertension   . Hyperlipidemia   . CHF (congestive heart failure)   . Asthma   . COPD (chronic obstructive pulmonary disease)   . Blood transfusion    BP 154/78  Pulse 100  Resp 16  Ht 5' 2.5" (1.588 m)  Wt 242 lb (109.77 kg)  BMI 43.53 kg/m2  SpO2 %     Review of Systems  Musculoskeletal: Positive for back pain.  All other systems reviewed and are negative.       Objective:   Physical Exam  Constitutional: She is oriented to person, place, and time. She appears well-developed and well-nourished.   HENT:  Head: Normocephalic.  Neck: Neck supple.  Musculoskeletal: She exhibits tenderness.  Neurological: She is alert and oriented to person, place, and time.  Skin: Skin is warm and dry.Mild swelling of left lower le, NO redness, increased temperature or pain  Psychiatric: She has a normal mood and affect.  Symmetric normal motor tone is noted throughout. Normal muscle bulk. Muscle testing reveals 5/5 muscle strength of the upper extremity, and 5/5 of the lower extremity. Full range of motion in upper and lower extremities. ROM of spine is restricted. Fine motor movements are slow in both hands.  DTR in the upper and lower extremity are present and symmetric 2+, except right UE trace. No clonus is  noted.  Patient arises from chair with mild difficulty. Wide based gait with normal arm swing bilateral , able to walk on heels and toes . Tandem walk is stable. No pronator drift. Rhomberg negative.  Swelling of MCP joints 2-4, bilateral        Assessment & Plan:  1. Bilateral knee pain, worse on the right. Radiographs showed mild to moderate osteo arthritis  2. History of chronic low back pain, consider muscle relaxants, to help with increased muscle pain at the next visit.  3. History of left groin debridement with significant tissue loss  remotely.  4. Status post fall June 12, 2010, with history of right shoulder  dislocation and subsequent brachial plexus injury with residual  incoordination with fine motor movements. However, pain is  significantly improved over the last year.  5. COPD  6. 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  7. Mild swelling of left lower leg, NO redness, increased temperature or pain. Advised patient to follow up with her PCP asap, if it gets worse over the weekend, she should go to urgent care or the ED  8. Memory difficulties, patient stated, that she woke up today, and did not know what day it was, she felt very confused one morning. She also reported, that she had a similar episode 3 month ago, when she was at her PCP, and could not remember anything about her visit there. I referred her to Neurology, for an evaluation and work up for her memory difficulties, she had an appointment in June, she canceled this because there was a lot going on in her family, she states, that she will reschedule one soon.  Patient was short of her medication for 4 days at least, she has 2 UDSs where everything shows up negative, it was consistent, because the patient had taken her last medication 2-3 days before the UDS. She was supposed to follow up in 27 days, after the last visit, but canceled her appointment because of being out of town, because a  family member was sick. We did another UDS at the last visit, which was consistent.  Refilled her Oxycodone.

## 2013-05-07 NOTE — Patient Instructions (Signed)
Continue with your walking as it is safe, try to do some stretching exercises.

## 2013-06-07 ENCOUNTER — Encounter: Payer: Self-pay | Admitting: Physical Medicine and Rehabilitation

## 2013-06-07 ENCOUNTER — Encounter
Payer: Medicare Other | Attending: Physical Medicine and Rehabilitation | Admitting: Physical Medicine and Rehabilitation

## 2013-06-07 VITALS — BP 187/88 | HR 82 | Resp 16 | Ht 62.5 in | Wt 239.0 lb

## 2013-06-07 DIAGNOSIS — M170 Bilateral primary osteoarthritis of knee: Secondary | ICD-10-CM

## 2013-06-07 DIAGNOSIS — G8929 Other chronic pain: Secondary | ICD-10-CM | POA: Insufficient documentation

## 2013-06-07 DIAGNOSIS — Z79899 Other long term (current) drug therapy: Secondary | ICD-10-CM | POA: Insufficient documentation

## 2013-06-07 DIAGNOSIS — R413 Other amnesia: Secondary | ICD-10-CM | POA: Insufficient documentation

## 2013-06-07 DIAGNOSIS — IMO0002 Reserved for concepts with insufficient information to code with codable children: Secondary | ICD-10-CM

## 2013-06-07 DIAGNOSIS — E119 Type 2 diabetes mellitus without complications: Secondary | ICD-10-CM | POA: Insufficient documentation

## 2013-06-07 DIAGNOSIS — J449 Chronic obstructive pulmonary disease, unspecified: Secondary | ICD-10-CM | POA: Insufficient documentation

## 2013-06-07 DIAGNOSIS — J4489 Other specified chronic obstructive pulmonary disease: Secondary | ICD-10-CM | POA: Insufficient documentation

## 2013-06-07 DIAGNOSIS — M545 Low back pain, unspecified: Secondary | ICD-10-CM | POA: Insufficient documentation

## 2013-06-07 DIAGNOSIS — M1710 Unilateral primary osteoarthritis, unspecified knee: Secondary | ICD-10-CM | POA: Insufficient documentation

## 2013-06-07 DIAGNOSIS — I10 Essential (primary) hypertension: Secondary | ICD-10-CM | POA: Insufficient documentation

## 2013-06-07 DIAGNOSIS — M19049 Primary osteoarthritis, unspecified hand: Secondary | ICD-10-CM | POA: Insufficient documentation

## 2013-06-07 MED ORDER — OXYCODONE-ACETAMINOPHEN 10-325 MG PO TABS: 1.0000 | ORAL_TABLET | Freq: Four times a day (QID) | ORAL | Status: DC

## 2013-06-07 NOTE — Progress Notes (Signed)
Subjective:    Patient ID: Yvonne [name], female    DOB: 02-May-1946, 67 y.o.   MRN: [id number]  HPI She is seen here at [facility] for multiple pain complaints. She has been recovering for over a year now from a right brachial plexus injury, status post a fall.  Her chronic pain complaints include right knee pain, low back pain, right arm pain and neck pain.  Problem has been stable .  Patient states, at her last visit, that she woke up, and did not know what day it was, she felt very confused one morning. She also reports, that she had a similar episode 3 month ago, when she was at her PCP, and could not remember anything about her visit there.I referred her to neurology for an evaluation of her memory difficulties at the last visit, she had an appointment in June, she cancelled that appointment, she will schedule a new one. She reports that she saw her PCP one week ago for her elevated BP, he educated her about healthy nutrition and a healthy lifestyle. She will follow up with him again in 3 weeks, for further evaluation/treatment.  Pain Inventory Average Pain 7 Pain Right Now 7 My pain is sharp, stabbing and aching  In the last 24 hours, has pain interfered with the following? General activity 7 Relation with others 6 Enjoyment of life 7 What TIME of day is your pain at its worst? na Sleep (in general) NA  Pain is worse with: na Pain improves with: na Relief from Meds: na  Mobility walk without assistance how many minutes can you walk? 10-15 ability to climb steps?  no do you drive?  no needs help with transfers  Function disabled: date disabled na I need assistance with the following:  dressing, meal prep, household duties and shopping  Neuro/Psych weakness trouble walking spasms anxiety  Prior Studies Any changes since last visit?  no  Physicians involved in your care Any changes since last visit?  no   History reviewed. No pertinent family  history. History   Social History  . Marital Status: Widowed    Spouse Name: N/A    Number of Children: N/A  . Years of Education: N/A   Social History Main Topics  . Smoking status: Current Some Day Smoker -- 1.00 packs/day for 30 years    Types: Cigarettes    Last Attempt to Quit: 02/24/2012  . Smokeless tobacco: Never Used     Comment: was dx with COPD  . Alcohol Use: No  . Drug Use: No  . Sexually Active: None   Other Topics Concern  . None   Social History Narrative   Lives at home with her son. Still ambulatory without cane or walker   Past Surgical History  Procedure Laterality Date  . Back surgery      from bacteria  . Abdominal hysterectomy     Past Medical History  Diagnosis Date  . Cervicalgia   . Lumbago   . Pain in joint, hand   . Disturbance of skin sensation   . Polyneuropathy in diabetes(357.2)   . Olecranon bursitis   . Chronic pain syndrome   . Diabetes mellitus   . Hypertension   . Hyperlipidemia   . CHF (congestive heart failure)   . Asthma   . COPD (chronic obstructive pulmonary disease)   . Blood transfusion    BP 187/88  Pulse 82  Resp 16  Ht 5' 2.5" (1.588 m)  Wt 239  lb (108.41 kg)  BMI 42.99 kg/m2  SpO2 97%     Review of Systems  Musculoskeletal: Positive for gait problem.  Neurological: Positive for weakness.       Spasms  Psychiatric/Behavioral: Positive for agitation.  All other systems reviewed and are negative.       Objective:   Physical Exam Constitutional: She is oriented to person, place, and time. She appears well-developed and well-nourished.   HENT:  Head: Normocephalic.  Neck: Neck supple.  Musculoskeletal: She exhibits tenderness.  Neurological: She is alert and oriented to person, place, and time.  Skin: Skin is warm and dry.Mild swelling of left lower le, NO redness, increased temperature or pain  Psychiatric: She has a normal mood and affect.  Symmetric normal motor tone is noted throughout.  Normal muscle bulk. Muscle testing reveals 5/5 muscle strength of the upper extremity, and 5/5 of the lower extremity. Full range of motion in upper and lower extremities. ROM of spine is restricted. Fine motor movements are slow in both hands.  DTR in the upper and lower extremity are present and symmetric 2+, except right UE trace. No clonus is noted.  Patient arises from chair with mild difficulty. Wide based gait with normal arm swing bilateral , able to walk on heels and toes . Tandem walk is stable. No pronator drift. Rhomberg negative.  Swelling of MCP joints 2-4, bilateral        Assessment & Plan:  1. Bilateral knee pain, worse on the right. Radiographs showed mild to moderate osteo arthritis  2. History of chronic low back pain, consider muscle relaxants, to help with increased muscle pain at the next visit.  3. History of left groin debridement with significant tissue loss  remotely.  4. Status post fall June 12, 2010, with history of right shoulder  dislocation and subsequent brachial plexus injury with residual  incoordination with fine motor movements. However, pain is  significantly improved over the last year.  5. COPD  6. 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  7. Elevated BP, saw PCP for this, is trying to eat a healthy diet and exercise more. I also discussed with her a healthy lifestyle in detail. 8. Memory difficulties, patient stated, that she woke up today, and did not know what day it was, she felt very confused one morning. She also reported, that she had a similar episode 3 month ago, when she was at her PCP, and could not remember anything about her visit there. I referred her to Neurology, for an evaluation and work up for her memory difficulties, she had an appointment in June, she canceled this because there was a lot going on in her family, she states, that she will reschedule one soon.  Patient was short of her medication for 4 days  at least, she has 2 UDSs where everything shows up negative, it was consistent, because the patient had taken her last medication 2-3 days before the UDS.  We did another UDS at the last visit, which was consistent.  Refilled her Oxycodone.

## 2013-06-07 NOTE — Patient Instructions (Signed)
Try to walk some, and stay as active as tolerated.

## 2013-07-08 ENCOUNTER — Encounter: Payer: Self-pay | Admitting: Physical Medicine and Rehabilitation

## 2013-07-08 ENCOUNTER — Encounter
Payer: Medicare Other | Attending: Physical Medicine and Rehabilitation | Admitting: Physical Medicine and Rehabilitation

## 2013-07-08 VITALS — BP 172/88 | HR 96 | Resp 16 | Ht 62.5 in | Wt 226.0 lb

## 2013-07-08 DIAGNOSIS — J449 Chronic obstructive pulmonary disease, unspecified: Secondary | ICD-10-CM | POA: Insufficient documentation

## 2013-07-08 DIAGNOSIS — M545 Other chronic pain: Secondary | ICD-10-CM

## 2013-07-08 DIAGNOSIS — R030 Elevated blood-pressure reading, without diagnosis of hypertension: Secondary | ICD-10-CM | POA: Insufficient documentation

## 2013-07-08 DIAGNOSIS — R279 Unspecified lack of coordination: Secondary | ICD-10-CM | POA: Insufficient documentation

## 2013-07-08 DIAGNOSIS — M1710 Unilateral primary osteoarthritis, unspecified knee: Secondary | ICD-10-CM | POA: Insufficient documentation

## 2013-07-08 DIAGNOSIS — M19049 Primary osteoarthritis, unspecified hand: Secondary | ICD-10-CM | POA: Insufficient documentation

## 2013-07-08 DIAGNOSIS — G8929 Other chronic pain: Secondary | ICD-10-CM

## 2013-07-08 DIAGNOSIS — G540 Brachial plexus disorders: Secondary | ICD-10-CM | POA: Insufficient documentation

## 2013-07-08 DIAGNOSIS — R413 Other amnesia: Secondary | ICD-10-CM | POA: Insufficient documentation

## 2013-07-08 DIAGNOSIS — J4489 Other specified chronic obstructive pulmonary disease: Secondary | ICD-10-CM | POA: Insufficient documentation

## 2013-07-08 MED ORDER — OXYCODONE-ACETAMINOPHEN 10-325 MG PO TABS: 1.0000 | ORAL_TABLET | Freq: Four times a day (QID) | ORAL | Status: DC

## 2013-07-08 MED ORDER — GABAPENTIN 400 MG PO CAPS: ORAL_CAPSULE | ORAL | Status: DC

## 2013-07-08 NOTE — Patient Instructions (Signed)
Follow up with your PCP for your high Blood pressure

## 2013-07-08 NOTE — Progress Notes (Signed)
Subjective:    Patient ID: Yvonne [name], female    DOB: [birth year], 67 y.o.   MRN: [id number]  HPI She is seen here at [facility] for multiple pain complaints. She has been recovering for over a year now from a right brachial plexus injury, status post a fall.  Her chronic pain complaints include right knee pain, low back pain, right arm pain and neck pain.  Problem has been stable .  Patient stated, at one visit, that she woke up, and did not know what day it was, she felt very confused one morning. She also reports, that she had a similar episode 3 month before, when she was at her PCP, and could not remember anything about her visit there.I referred her to neurology for an evaluation of her memory difficulties at the last visit, she had an appointment in June, she cancelled that appointment, she will schedule a new one.  She reports that she saw her PCP one week ago for her elevated BP, he educated her about healthy nutrition and a healthy lifestyle. She has not followed up with him, for further evaluation/treatment.  Pain Inventory Average Pain 10 Pain Right Now 8 My pain is constant  In the last 24 hours, has pain interfered with the following? General activity 8 Relation with others 5 Enjoyment of life 5 What TIME of day is your pain at its worst? constant Sleep (in general) Poor  Pain is worse with: walking, bending, sitting, inactivity, standing and some activites Pain improves with: rest and medication Relief from Meds: 6  Mobility walk with assistance how many minutes can you walk? 10 ability to climb steps?  yes do you drive?  no Do you have any goals in this area?  yes  Function retired  Neuro/Psych weakness numbness tremor tingling trouble walking anxiety  Prior Studies Any changes since last visit?  no  Physicians involved in your care Any changes since last visit?  no   History reviewed. No pertinent family history. History   Social History   . Marital Status: Widowed    Spouse Name: N/A    Number of Children: N/A  . Years of Education: N/A   Social History Main Topics  . Smoking status: Current Some Day Smoker -- 1.00 packs/day for 30 years    Types: Cigarettes    Last Attempt to Quit: 02/24/2012  . Smokeless tobacco: Never Used     Comment: was dx with COPD  . Alcohol Use: No  . Drug Use: No  . Sexual Activity: None   Other Topics Concern  . None   Social History Narrative   Lives at home with her son. Still ambulatory without cane or walker   Past Surgical History  Procedure Laterality Date  . Back surgery      from bacteria  . Abdominal hysterectomy     Past Medical History  Diagnosis Date  . Cervicalgia   . Lumbago   . Pain in joint, hand   . Disturbance of skin sensation   . Polyneuropathy in diabetes(357.2)   . Olecranon bursitis   . Chronic pain syndrome   . Diabetes mellitus   . Hypertension   . Hyperlipidemia   . CHF (congestive heart failure)   . Asthma   . COPD (chronic obstructive pulmonary disease)   . Blood transfusion    BP 172/88  Pulse 96  Resp 16  Ht 5' 2.5" (1.588 m)  Wt 226 lb (102.513 kg)  BMI 40.65  kg/m2  SpO2 97%     Review of Systems  Musculoskeletal: Positive for myalgias, back pain, arthralgias and gait problem.  Neurological: Positive for tremors, weakness and numbness.  Psychiatric/Behavioral: The patient is nervous/anxious.   All other systems reviewed and are negative.       Objective:   Physical Exam Head: Normocephalic.  Neck: Neck supple.  Musculoskeletal: She exhibits tenderness.  Neurological: She is alert and oriented to person, place, and time.  Skin: Skin is warm and dry.Mild swelling of left lower le, NO redness, increased temperature or pain  Psychiatric: She has a normal mood and affect.  Symmetric normal motor tone is noted throughout. Normal muscle bulk. Muscle testing reveals 5/5 muscle strength of the upper extremity, and 5/5 of the  lower extremity. Full range of motion in upper and lower extremities. ROM of spine is restricted. Fine motor movements are slow in both hands.  DTR in the upper and lower extremity are present and symmetric 2+, except right UE trace. No clonus is noted.  Patient arises from chair with mild difficulty. Wide based gait with normal arm swing bilateral , able to walk on heels and toes . Tandem walk is stable. No pronator drift. Rhomberg negative.  Swelling of MCP joints 2-4, bilateral        Assessment & Plan:  1. Bilateral knee pain, worse on the right. Radiographs showed mild to moderate osteo arthritis  2. History of chronic low back pain, consider muscle relaxants, to help with increased muscle pain at the next visit.  3. History of left groin debridement with significant tissue loss  remotely.  4. Status post fall June 12, 2010, with history of right shoulder  dislocation and subsequent brachial plexus injury with residual  incoordination with fine motor movements. However, pain is  significantly improved over the last year.  5. COPD  6. 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  7. Elevated BP, saw PCP for this,but cancelled her follow up appointment, I strongly advised her to follow up with her PCP for her still elevated BP ! She is trying to eat a healthy diet and exercise more. I also discussed with her a healthy lifestyle again.  8. Memory difficulties, patient stated, that she woke up today, and did not know what day it was, she felt very confused one morning. She also reported, that she had a similar episode 3 month ago, when she was at her PCP, and could not remember anything about her visit there. I referred her to Neurology, for an evaluation and work up for her memory difficulties, she had an appointment in June, she canceled this because there was a lot going on in her family, she states, that she will reschedule one soon, which she also did not do, I also  advised her to schedule this follow up appointment.  Patient was short of her medication for 4 days at least, she had 2 UDSs where everything shows up negative, but it was consistent, because the patient had taken her last medication 2-3 days before the UDS. We did another UDS at the last visit, which was consistent, with showing her medication in the UDS.  Refilled her Oxycodone. We will call her in in 2 weeks for a pill count. Follow up visit in 1 month.

## 2013-07-29 ENCOUNTER — Encounter: Payer: Medicare Other | Admitting: Physical Medicine and Rehabilitation

## 2013-08-05 ENCOUNTER — Ambulatory Visit: Payer: Medicare Other | Admitting: Physical Medicine and Rehabilitation

## 2013-08-10 ENCOUNTER — Encounter
Payer: Medicare Other | Attending: Physical Medicine and Rehabilitation | Admitting: Physical Medicine and Rehabilitation

## 2013-08-10 ENCOUNTER — Encounter: Payer: Self-pay | Admitting: Physical Medicine and Rehabilitation

## 2013-08-10 VITALS — BP 139/77 | HR 97 | Resp 16 | Ht 62.5 in | Wt 223.0 lb

## 2013-08-10 DIAGNOSIS — M545 Low back pain, unspecified: Secondary | ICD-10-CM

## 2013-08-10 DIAGNOSIS — G8929 Other chronic pain: Secondary | ICD-10-CM

## 2013-08-10 DIAGNOSIS — J4489 Other specified chronic obstructive pulmonary disease: Secondary | ICD-10-CM | POA: Insufficient documentation

## 2013-08-10 DIAGNOSIS — R413 Other amnesia: Secondary | ICD-10-CM | POA: Insufficient documentation

## 2013-08-10 DIAGNOSIS — Z79899 Other long term (current) drug therapy: Secondary | ICD-10-CM | POA: Insufficient documentation

## 2013-08-10 DIAGNOSIS — I10 Essential (primary) hypertension: Secondary | ICD-10-CM | POA: Insufficient documentation

## 2013-08-10 DIAGNOSIS — M1710 Unilateral primary osteoarthritis, unspecified knee: Secondary | ICD-10-CM | POA: Insufficient documentation

## 2013-08-10 DIAGNOSIS — M19049 Primary osteoarthritis, unspecified hand: Secondary | ICD-10-CM | POA: Insufficient documentation

## 2013-08-10 DIAGNOSIS — M79609 Pain in unspecified limb: Secondary | ICD-10-CM

## 2013-08-10 DIAGNOSIS — M79601 Pain in right arm: Secondary | ICD-10-CM

## 2013-08-10 DIAGNOSIS — J449 Chronic obstructive pulmonary disease, unspecified: Secondary | ICD-10-CM | POA: Insufficient documentation

## 2013-08-10 DIAGNOSIS — M25519 Pain in unspecified shoulder: Secondary | ICD-10-CM | POA: Insufficient documentation

## 2013-08-10 MED ORDER — OXYCODONE-ACETAMINOPHEN 10-325 MG PO TABS: 1.0000 | ORAL_TABLET | Freq: Four times a day (QID) | ORAL | Status: DC

## 2013-08-10 NOTE — Progress Notes (Signed)
Subjective:    Patient ID: Yvonne [name], female    DOB: 02-15-1946, 67 y.o.   MRN: [id number]  HPI She is seen here at [facility] for multiple pain complaints. She has been recovering for over a year now from a right brachial plexus injury, status post a fall.  Her chronic pain complaints include right knee pain, low back pain, right arm pain and neck pain.  Problem has been stable .  Patient stated, at one visit, that she woke up, and did not know what day it was, she felt very confused one morning. She also reports, that she had a similar episode 3 month before, when she was at her PCP, and could not remember anything about her visit there.I referred her to neurology for an evaluation of her memory difficulties , she had an appointment in June, she cancelled that appointment, she states that she does not have problems with her memory at this point, and that she did not have a similar episode again, she states that she had those memory difficulties because she was very depressed at that time.  She reports that she will see her PCP for a follow up of her elevated BP.  Pain Inventory Average Pain 5 Pain Right Now 8 My pain is constant  In the last 24 hours, has pain interfered with the following? General activity 7 Relation with others 8 Enjoyment of life 7 What TIME of day is your pain at its worst? evening Sleep (in general) Fair  Pain is worse with: walking, bending, standing and some activites Pain improves with: rest and medication Relief from Meds: 9  Mobility walk without assistance how many minutes can you walk? 10 ability to climb steps?  no do you drive?  no needs help with transfers  Function disabled: date disabled . I need assistance with the following:  bathing, meal prep, household duties and shopping  Neuro/Psych weakness  Prior Studies Any changes since last visit?  no  Physicians involved in your care Any changes since last visit?  no   History  reviewed. No pertinent family history. History   Social History  . Marital Status: Widowed    Spouse Name: N/A    Number of Children: N/A  . Years of Education: N/A   Social History Main Topics  . Smoking status: Current Some Day Smoker -- 1.00 packs/day for 30 years    Types: Cigarettes    Last Attempt to Quit: 02/24/2012  . Smokeless tobacco: Never Used     Comment: was dx with COPD  . Alcohol Use: No  . Drug Use: No  . Sexual Activity: None   Other Topics Concern  . None   Social History Narrative   Lives at home with her son. Still ambulatory without cane or walker   Past Surgical History  Procedure Laterality Date  . Back surgery      from bacteria  . Abdominal hysterectomy     Past Medical History  Diagnosis Date  . Cervicalgia   . Lumbago   . Pain in joint, hand   . Disturbance of skin sensation   . Polyneuropathy in diabetes(357.2)   . Olecranon bursitis   . Chronic pain syndrome   . Diabetes mellitus   . Hypertension   . Hyperlipidemia   . CHF (congestive heart failure)   . Asthma   . COPD (chronic obstructive pulmonary disease)   . Blood transfusion    BP 139/77  Pulse 97  Resp  16  Ht 5' 2.5" (1.588 m)  Wt 223 lb (101.152 kg)  BMI 40.11 kg/m2  SpO2 95%     Review of Systems  Musculoskeletal: Positive for back pain.  Neurological: Positive for weakness.  All other systems reviewed and are negative.       Objective:   Physical Exam Physical Exam  Head: Normocephalic.  Neck: Neck supple.  Musculoskeletal: She exhibits tenderness.  Neurological: She is alert and oriented to person, place, and time.  Skin: Skin is warm and dry.Mild swelling of left lower le, NO redness, increased temperature or pain  Psychiatric: She has a normal mood and affect.  Symmetric normal motor tone is noted throughout. Normal muscle bulk. Muscle testing reveals 5/5 muscle strength of the upper extremity, and 5/5 of the lower extremity. Full range of motion in  upper and lower extremities. ROM of spine is restricted. Fine motor movements are slow in both hands.  DTR in the upper and lower extremity are present and symmetric 2+, except right UE trace. No clonus is noted.  Patient arises from chair with mild difficulty. Wide based gait with normal arm swing bilateral , able to walk on heels and toes . Tandem walk is stable. No pronator drift. Rhomberg negative.  Swelling of MCP joints 2-4, bilateral        Assessment & Plan:  1. Bilateral knee pain, worse on the right. Radiographs showed mild to moderate osteo arthritis  2. History of chronic low back pain, consider muscle relaxants, to help with increased muscle pain at the next visit.  3. History of left groin debridement with significant tissue loss  remotely.  4. Status post fall June 12, 2010, with history of right shoulder  dislocation and subsequent brachial plexus injury with residual  incoordination with fine motor movements. However, pain is  significantly improved over the last year.  5. COPD  6. 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  7. Elevated BP, saw PCP for this,but cancelled her follow up appointment, I strongly advised her to follow up with her PCP for her still elevated BP ! She is trying to eat a healthy diet and exercise more. I also discussed with her a healthy lifestyle again.  8. Memory difficulties, patient stated, that she woke up today, and did not know what day it was, she felt very confused one morning. She also reported, that she had a similar episode 3 month before, when she was at her PCP, and could not remember anything about her visit there. I referred her to Neurology, for an evaluation and work up for her memory difficulties, she had an appointment in [place] cancelled that appointment, she states that she does not have problems with her memory at this point, and that she did not have a similar episode again, she states that she had those  memory difficulties because she was very depressed at that time.  Patient was short of her medication for 4 days at least, at one time, she had 2 UDSs where everything shows up negative, but it was consistent, because the patient had taken her last medication 2-3 days before the UDS. We did another UDS at the last visit, which was consistent, with showing her medication in the UDS.  Refilled her Oxycodone.  We will call her in in 2 weeks for a pill count.  Follow up visit in 1 month.

## 2013-08-10 NOTE — Patient Instructions (Signed)
Try to stay as active as pain permits. 

## 2013-08-24 ENCOUNTER — Telehealth: Payer: Self-pay | Admitting: Physical Medicine and Rehabilitation

## 2013-08-24 NOTE — Telephone Encounter (Signed)
Left voicemail for patient on 08/23/13 to call our office by 4:30 to get her in for a pill count and UDS.  Did not receive a call back and was told to call this morning 08/24/13.  Tried to call patient, but son's voicemail is full and can not leave a voicemail (8:30am).

## 2013-08-26 ENCOUNTER — Telehealth: Payer: Self-pay | Admitting: Physical Medicine and Rehabilitation

## 2013-08-26 NOTE — Telephone Encounter (Signed)
Ok so he has listen to the voicemail, obviously and did not contact us, I will look back at her chart decide after whether she will be d/c

## 2013-08-26 NOTE — Telephone Encounter (Signed)
Just tried calling patient's phone number and now her sons voicemail is not full at 3:10pm

## 2013-08-27 NOTE — Telephone Encounter (Signed)
Su Monks, PA-C at 08/26/2013 3:39 PM   Status: Signed            Ok so he has listen to the voicemail, obviously and did not contact us, I will look back at her chart decide after whether she will be d/c        April L Marshall at 08/26/2013 3:12 PM    Status: Signed             Just tried calling patient's phone number and now her sons voicemail is not full at 3:10pm   Please d/c patient, she had more than one inconsistencies in the past

## 2013-08-27 NOTE — Telephone Encounter (Signed)
Discharge letter mailed to patient. ?

## 2013-09-03 ENCOUNTER — Other Ambulatory Visit: Payer: Self-pay | Admitting: Physical Medicine and Rehabilitation

## 2013-09-03 ENCOUNTER — Ambulatory Visit: Payer: Medicare Other | Admitting: Physical Medicine and Rehabilitation

## 2013-09-10 ENCOUNTER — Telehealth: Payer: Self-pay

## 2013-09-10 NOTE — Telephone Encounter (Signed)
Marcial Pacas( pt's son) called and said he didn't understand why his mother was discharged from the clinic for nonpayment that she has medicaid and medicare. He said she needed her pain medication that she was going through withdraws. A little while later the patient calls back and leaves a message and the only thing it said was she was going through withdraws call her back.

## 2013-09-10 NOTE — Telephone Encounter (Signed)
Mr Garbett has already approached the front desk about her discharge and has been told the reason.  She was non compliant with requests to return our calls and come in for pill counts and therefore has been discharged.  I explained that she is no longer under contract with our clinic and is free to go to the urgent care, ED or to her PCP to get medication.  We made every attempt to speak with Ms Mccann but the did not return our calls and have received the discharge letter.  No further attempt to reach will be made.

## 2013-10-03 ENCOUNTER — Other Ambulatory Visit: Payer: Self-pay | Admitting: Physical Medicine and Rehabilitation

## 2014-06-20 ENCOUNTER — Ambulatory Visit: Payer: Medicare Other | Admitting: Internal Medicine

## 2014-06-23 ENCOUNTER — Ambulatory Visit: Payer: Medicaid Other | Attending: Internal Medicine | Admitting: Internal Medicine

## 2014-06-23 ENCOUNTER — Encounter: Payer: Self-pay | Admitting: Internal Medicine

## 2014-06-23 VITALS — BP 175/79 | HR 92 | Temp 98.6°F | Resp 16 | Ht 62.0 in | Wt 238.0 lb

## 2014-06-23 DIAGNOSIS — E785 Hyperlipidemia, unspecified: Secondary | ICD-10-CM | POA: Diagnosis not present

## 2014-06-23 DIAGNOSIS — Z7189 Other specified counseling: Secondary | ICD-10-CM | POA: Diagnosis present

## 2014-06-23 DIAGNOSIS — E1149 Type 2 diabetes mellitus with other diabetic neurological complication: Secondary | ICD-10-CM | POA: Insufficient documentation

## 2014-06-23 DIAGNOSIS — F329 Major depressive disorder, single episode, unspecified: Secondary | ICD-10-CM | POA: Diagnosis not present

## 2014-06-23 DIAGNOSIS — J449 Chronic obstructive pulmonary disease, unspecified: Secondary | ICD-10-CM | POA: Insufficient documentation

## 2014-06-23 DIAGNOSIS — E1142 Type 2 diabetes mellitus with diabetic polyneuropathy: Secondary | ICD-10-CM | POA: Insufficient documentation

## 2014-06-23 DIAGNOSIS — I509 Heart failure, unspecified: Secondary | ICD-10-CM | POA: Insufficient documentation

## 2014-06-23 DIAGNOSIS — F17200 Nicotine dependence, unspecified, uncomplicated: Secondary | ICD-10-CM | POA: Insufficient documentation

## 2014-06-23 DIAGNOSIS — K219 Gastro-esophageal reflux disease without esophagitis: Secondary | ICD-10-CM | POA: Diagnosis not present

## 2014-06-23 DIAGNOSIS — G609 Hereditary and idiopathic neuropathy, unspecified: Secondary | ICD-10-CM

## 2014-06-23 DIAGNOSIS — E119 Type 2 diabetes mellitus without complications: Secondary | ICD-10-CM

## 2014-06-23 DIAGNOSIS — F3289 Other specified depressive episodes: Secondary | ICD-10-CM | POA: Insufficient documentation

## 2014-06-23 DIAGNOSIS — I10 Essential (primary) hypertension: Secondary | ICD-10-CM | POA: Diagnosis not present

## 2014-06-23 DIAGNOSIS — J4489 Other specified chronic obstructive pulmonary disease: Secondary | ICD-10-CM | POA: Insufficient documentation

## 2014-06-23 DIAGNOSIS — M792 Neuralgia and neuritis, unspecified: Secondary | ICD-10-CM

## 2014-06-23 DIAGNOSIS — F32A Depression, unspecified: Secondary | ICD-10-CM | POA: Insufficient documentation

## 2014-06-23 LAB — COMPLETE METABOLIC PANEL WITH GFR
ALT: 11 U/L (ref 0–35)
AST: 15 U/L (ref 0–37)
Albumin: 4.1 g/dL (ref 3.5–5.2)
Alkaline Phosphatase: 93 U/L (ref 39–117)
BUN: 8 mg/dL (ref 6–23)
CO2: 28 mEq/L (ref 19–32)
Calcium: 10.3 mg/dL (ref 8.4–10.5)
Chloride: 98 mEq/L (ref 96–112)
Creat: 0.82 mg/dL (ref 0.50–1.10)
GFR, Est African American: 84 mL/min
GFR, Est Non African American: 73 mL/min
Glucose, Bld: 195 mg/dL — ABNORMAL HIGH (ref 70–99)
Potassium: 5.3 mEq/L (ref 3.5–5.3)
Sodium: 133 mEq/L — ABNORMAL LOW (ref 135–145)
Total Bilirubin: 0.2 mg/dL (ref 0.2–1.2)
Total Protein: 6.8 g/dL (ref 6.0–8.3)

## 2014-06-23 LAB — LIPID PANEL
Cholesterol: 242 mg/dL — ABNORMAL HIGH (ref 0–200)
HDL: 35 mg/dL — ABNORMAL LOW (ref >39)
Total CHOL/HDL Ratio: 6.9 Ratio
Triglycerides: 470 mg/dL — ABNORMAL HIGH (ref <150)

## 2014-06-23 LAB — GLUCOSE, POCT (MANUAL RESULT ENTRY): POC Glucose: 223 mg/dl — AB (ref 70–99)

## 2014-06-23 LAB — POCT GLYCOSYLATED HEMOGLOBIN (HGB A1C): Hemoglobin A1C: 8.3

## 2014-06-23 MED ORDER — MIRTAZAPINE 30 MG PO TABS: 30.0000 mg | ORAL_TABLET | Freq: Every day | ORAL | Status: DC

## 2014-06-23 MED ORDER — ACETAMINOPHEN-CODEINE #3 300-30 MG PO TABS: 1.0000 | ORAL_TABLET | ORAL | Status: DC | PRN

## 2014-06-23 MED ORDER — PRAVASTATIN SODIUM 20 MG PO TABS: 20.0000 mg | ORAL_TABLET | Freq: Every day | ORAL | Status: DC

## 2014-06-23 MED ORDER — RANITIDINE HCL 150 MG PO CAPS
150.0000 mg | ORAL_CAPSULE | Freq: Two times a day (BID) | ORAL | Status: DC
Start: 2014-06-23 — End: 2015-02-21

## 2014-06-23 MED ORDER — FREESTYLE SYSTEM KIT: 1.0000 | PACK | Status: DC | PRN

## 2014-06-23 MED ORDER — GABAPENTIN 400 MG PO CAPS: ORAL_CAPSULE | ORAL | Status: DC

## 2014-06-23 MED ORDER — CLONAZEPAM 1 MG PO TABS: 1.0000 mg | ORAL_TABLET | Freq: Every day | ORAL | Status: DC

## 2014-06-23 MED ORDER — METFORMIN HCL 500 MG PO TABS: 500.0000 mg | ORAL_TABLET | Freq: Two times a day (BID) | ORAL | Status: DC

## 2014-06-23 MED ORDER — CITALOPRAM HYDROBROMIDE 40 MG PO TABS: 40.0000 mg | ORAL_TABLET | Freq: Every day | ORAL | Status: DC

## 2014-06-23 MED ORDER — INSULIN GLARGINE 100 UNIT/ML ~~LOC~~ SOLN: 20.0000 [IU] | Freq: Every day | SUBCUTANEOUS | Status: DC

## 2014-06-23 MED ORDER — MELOXICAM 15 MG PO TABS: 15.0000 mg | ORAL_TABLET | Freq: Every day | ORAL | Status: DC

## 2014-06-23 NOTE — Progress Notes (Signed)
Pt is here to establish care. Pt has a history of HTN, diabetes, COPD,CHF and asthma.

## 2014-06-23 NOTE — Patient Instructions (Signed)
Depression Depression refers to feeling sad, low, down in the dumps, blue, gloomy, or empty. In general, there are two kinds of depression: 1. Normal sadness or normal grief. This kind of depression is one that we all feel from time to time after upsetting life experiences, such as the loss of a job or the ending of a relationship. This kind of depression is considered normal, is short lived, and resolves within a few days to 2 weeks. Depression experienced after the loss of a loved one (bereavement) often lasts longer than 2 weeks but normally gets better with time. 2. Clinical depression. This kind of depression lasts longer than normal sadness or normal grief or interferes with your ability to function at home, at work, and in school. It also interferes with your personal relationships. It affects almost every aspect of your life. Clinical depression is an illness. Symptoms of depression can also be caused by conditions other than those mentioned above, such as:  Physical illness. Some physical illnesses, including underactive thyroid gland (hypothyroidism), severe anemia, specific types of cancer, diabetes, uncontrolled seizures, heart and lung problems, strokes, and chronic pain are commonly associated with symptoms of depression.  Side effects of some prescription medicine. In some people, certain types of medicine can cause symptoms of depression.  Substance abuse. Abuse of alcohol and illicit drugs can cause symptoms of depression. SYMPTOMS Symptoms of normal sadness and normal grief include the following:  Feeling sad or crying for short periods of time.  Not caring about anything (apathy).  Difficulty sleeping or sleeping too much.  No longer able to enjoy the things you used to enjoy.  Desire to be by oneself all the time (social isolation).  Lack of energy or motivation.  Difficulty concentrating or remembering.  Change in appetite or weight.  Restlessness or  agitation. Symptoms of clinical depression include the same symptoms of normal sadness or normal grief and also the following symptoms:  Feeling sad or crying all the time.  Feelings of guilt or worthlessness.  Feelings of hopelessness or helplessness.  Thoughts of suicide or the desire to harm yourself (suicidal ideation).  Loss of touch with reality (psychotic symptoms). Seeing or hearing things that are not real (hallucinations) or having false beliefs about your life or the people around you (delusions and paranoia). DIAGNOSIS  The diagnosis of clinical depression is usually based on how bad the symptoms are and how long they have lasted. Your health care provider will also ask you questions about your medical history and substance use to find out if physical illness, use of prescription medicine, or substance abuse is causing your depression. Your health care provider may also order blood tests. TREATMENT  Often, normal sadness and normal grief do not require treatment. However, sometimes antidepressant medicine is given for bereavement to ease the depressive symptoms until they resolve. The treatment for clinical depression depends on how bad the symptoms are but often includes antidepressant medicine, counseling with a mental health professional, or both. Your health care provider will help to determine what treatment is best for you. Depression caused by physical illness usually goes away with appropriate medical treatment of the illness. If prescription medicine is causing depression, talk with your health care provider about stopping the medicine, decreasing the dose, or changing to another medicine. Depression caused by the abuse of alcohol or illicit drugs goes away when you stop using these substances. Some adults need professional help in order to stop drinking or using drugs. SEEK IMMEDIATE MEDICAL   CARE IF:  You have thoughts about hurting yourself or others.  You lose touch  with reality (have psychotic symptoms).  You are taking medicine for depression and have a serious side effect. FOR MORE INFORMATION  National [employer] on Mental Illness: www.nami.[employer]: https://carter.com/ Document Released: 11/08/2000 Document Revised: 03/28/2014 Document Reviewed: 02/10/2012 [facility] Patient Information 2015 [place], [place]. This information is not intended to replace advice given to you by your health care provider. Make sure you discuss any questions you have with your health care provider. DASH Eating Plan DASH stands for "Dietary Approaches to Stop Hypertension." The DASH eating plan is a healthy eating plan that has been shown to reduce high blood pressure (hypertension). Additional health benefits may include reducing the risk of type 2 diabetes mellitus, heart disease, and stroke. The DASH eating plan may also help with weight loss. WHAT DO I NEED TO KNOW ABOUT THE DASH EATING PLAN? For the DASH eating plan, you will follow these general guidelines:  Choose foods with a percent daily value for sodium of less than 5% (as listed on the food label).  Use salt-free seasonings or herbs instead of table salt or sea salt.  Check with your health care provider or pharmacist before using salt substitutes.  Eat lower-sodium products, often labeled as "lower sodium" or "no salt added."  Eat fresh foods.  Eat more vegetables, fruits, and low-fat dairy products.  Choose whole grains. Look for the word "whole" as the first word in the ingredient list.  Choose fish and skinless chicken or [place] more often than red meat. Limit fish, poultry, and meat to 6 oz (170 g) each day.  Limit sweets, desserts, sugars, and sugary drinks.  Choose heart-healthy fats.  Limit cheese to 1 oz (28 g) per day.  Eat more home-cooked food and less restaurant, buffet, and fast food.  Limit fried foods.  Cook foods using methods other than  frying.  Limit canned vegetables. If you do use them, rinse them well to decrease the sodium.  When eating at a restaurant, ask that your food be prepared with less salt, or no salt if possible. WHAT FOODS CAN I EAT? Seek help from a dietitian for individual calorie needs. Grains Whole grain or whole wheat bread. Brown rice. Whole grain or whole wheat pasta. Quinoa, bulgur, and whole grain cereals. Low-sodium cereals. Corn or whole wheat flour tortillas. Whole grain cornbread. Whole grain crackers. Low-sodium crackers. Vegetables Fresh or frozen vegetables (raw, steamed, roasted, or grilled). Low-sodium or reduced-sodium tomato and vegetable juices. Low-sodium or reduced-sodium tomato sauce and paste. Low-sodium or reduced-sodium canned vegetables.  Fruits All fresh, canned (in natural juice), or frozen fruits. Meat and Other Protein Products Ground beef (85% or leaner), grass-fed beef, or beef trimmed of fat. Skinless chicken or [place]. Ground chicken or [place]. Pork trimmed of fat. All fish and seafood. Eggs. Dried beans, peas, or lentils. Unsalted nuts and seeds. Unsalted canned beans. Dairy Low-fat dairy products, such as skim or 1% milk, 2% or reduced-fat cheeses, low-fat ricotta or cottage cheese, or plain low-fat yogurt. Low-sodium or reduced-sodium cheeses. Fats and Oils Tub margarines without trans fats. Light or reduced-fat mayonnaise and salad dressings (reduced sodium). Avocado. Safflower, olive, or canola oils. Natural peanut or almond butter. Other Unsalted popcorn and pretzels. The items listed above may not be a complete list of recommended foods or beverages. Contact your dietitian for more options. WHAT FOODS ARE NOT RECOMMENDED? Grains White bread. White pasta. White  rice. Refined cornbread. Bagels and croissants. Crackers that contain trans fat. Vegetables Creamed or fried vegetables. Vegetables in a cheese sauce. Regular canned vegetables. Regular canned tomato sauce  and paste. Regular tomato and vegetable juices. Fruits Dried fruits. Canned fruit in light or heavy syrup. Fruit juice. Meat and Other Protein Products Fatty cuts of meat. Ribs, chicken wings, bacon, sausage, bologna, salami, chitterlings, fatback, hot dogs, bratwurst, and packaged luncheon meats. Salted nuts and seeds. Canned beans with salt. Dairy Whole or 2% milk, cream, half-and-half, and cream cheese. Whole-fat or sweetened yogurt. Full-fat cheeses or blue cheese. Nondairy creamers and whipped toppings. Processed cheese, cheese spreads, or cheese curds. Condiments Onion and garlic salt, seasoned salt, table salt, and sea salt. Canned and packaged gravies. Worcestershire sauce. Tartar sauce. Barbecue sauce. Teriyaki sauce. Soy sauce, including reduced sodium. Steak sauce. Fish sauce. Oyster sauce. Cocktail sauce. Horseradish. Ketchup and mustard. Meat flavorings and tenderizers. Bouillon cubes. Hot sauce. Tabasco sauce. Marinades. Taco seasonings. Relishes. Fats and Oils Butter, stick margarine, lard, shortening, ghee, and bacon fat. Coconut, palm kernel, or palm oils. Regular salad dressings. Other Pickles and olives. Salted popcorn and pretzels. The items listed above may not be a complete list of foods and beverages to avoid. Contact your dietitian for more information. WHERE CAN I FIND MORE INFORMATION? National Heart, Lung, and Blood Institute: travelstabloid.com Document Released: 10/31/2011 Document Revised: 03/28/2014 Document Reviewed: 09/15/2013 [facility] Patient Information 2015 [place], [place]. This information is not intended to replace advice given to you by your health care provider. Make sure you discuss any questions you have with your health care provider. Diabetes Mellitus and Food It is important for you to manage your blood sugar (glucose) level. Your blood glucose level can be greatly affected by what you eat. Eating healthier foods in the  appropriate amounts throughout the day at about the same time each day will help you control your blood glucose level. It can also help slow or prevent worsening of your diabetes mellitus. Healthy eating may even help you improve the level of your blood pressure and reach or maintain a healthy weight.  HOW CAN FOOD AFFECT ME? Carbohydrates Carbohydrates affect your blood glucose level more than any other type of food. Your dietitian will help you determine how many carbohydrates to eat at each meal and teach you how to count carbohydrates. Counting carbohydrates is important to keep your blood glucose at a healthy level, especially if you are using insulin or taking certain medicines for diabetes mellitus. Alcohol Alcohol can cause sudden decreases in blood glucose (hypoglycemia), especially if you use insulin or take certain medicines for diabetes mellitus. Hypoglycemia can be a life-threatening condition. Symptoms of hypoglycemia (sleepiness, dizziness, and disorientation) are similar to symptoms of having too much alcohol.  If your health care provider has given you approval to drink alcohol, do so in moderation and use the following guidelines:  Women should not have more than one drink per day, and men should not have more than two drinks per day. One drink is equal to:  12 oz of beer.  5 oz of wine.  1 oz of hard liquor.  Do not drink on an empty stomach.  Keep yourself hydrated. Have water, diet soda, or unsweetened iced tea.  Regular soda, juice, and other mixers might contain a lot of carbohydrates and should be counted. WHAT FOODS ARE NOT RECOMMENDED? As you make food choices, it is important to remember that all foods are not the same. Some foods have fewer  nutrients per serving than other foods, even though they might have the same number of calories or carbohydrates. It is difficult to get your body what it needs when you eat foods with fewer nutrients. Examples of foods that you  should avoid that are high in calories and carbohydrates but low in nutrients include:  Trans fats (most processed foods list trans fats on the Nutrition Facts label).  Regular soda.  Juice.  Candy.  Sweets, such as cake, pie, doughnuts, and cookies.  Fried foods. WHAT FOODS CAN I EAT? Have nutrient-rich foods, which will nourish your body and keep you healthy. The food you should eat also will depend on several factors, including:  The calories you need.  The medicines you take.  Your weight.  Your blood glucose level.  Your blood pressure level.  Your cholesterol level. You also should eat a variety of foods, including:  Protein, such as meat, poultry, fish, tofu, nuts, and seeds (lean animal proteins are best).  Fruits.  Vegetables.  Dairy products, such as milk, cheese, and yogurt (low fat is best).  Breads, grains, pasta, cereal, rice, and beans.  Fats such as olive oil, trans fat-free margarine, canola oil, avocado, and olives. DOES EVERYONE WITH DIABETES MELLITUS HAVE THE SAME MEAL PLAN? Because every person with diabetes mellitus is different, there is not one meal plan that works for everyone. It is very important that you meet with a dietitian who will help you create a meal plan that is just right for you. Document Released: 08/08/2005 Document Revised: 11/16/2013 Document Reviewed: 10/08/2013 [facility] Patient Information 2015 [place], [place]. This information is not intended to replace advice given to you by your health care provider. Make sure you discuss any questions you have with your health care provider. Diabetes and Exercise Exercising regularly is important. It is not just about losing weight. It has many health benefits, such as:  Improving your overall fitness, flexibility, and endurance.  Increasing your bone density.  Helping with weight control.  Decreasing your body fat.  Increasing your muscle strength.  Reducing stress and  tension.  Improving your overall health. People with diabetes who exercise gain additional benefits because exercise:  Reduces appetite.  Improves the body's use of blood sugar (glucose).  Helps lower or control blood glucose.  Decreases blood pressure.  Helps control blood lipids (such as cholesterol and triglycerides).  Improves the body's use of the hormone insulin by:  Increasing the body's insulin sensitivity.  Reducing the body's insulin needs.  Decreases the risk for heart disease because exercising:  Lowers cholesterol and triglycerides levels.  Increases the levels of good cholesterol (such as high-density lipoproteins [HDL]) in the body.  Lowers blood glucose levels. YOUR ACTIVITY PLAN  Choose an activity that you enjoy and set realistic goals. Your health care provider or diabetes educator can help you make an activity plan that works for you. Exercise regularly as directed by your health care provider. This includes:  Performing resistance training twice a week such as push-ups, sit-ups, lifting weights, or using resistance bands.  Performing 150 minutes of cardio exercises each week such as walking, running, or playing sports.  Staying active and spending no more than 90 minutes at one time being inactive. Even short bursts of exercise are good for you. Three 10-minute sessions spread throughout the day are just as beneficial as a single 30-minute session. Some exercise ideas include:  Taking the dog for a walk.  Taking the stairs instead of the elevator.  Dancing to your favorite song.  Doing an exercise video.  Doing your favorite exercise with a friend. RECOMMENDATIONS FOR EXERCISING WITH TYPE 1 OR TYPE 2 DIABETES   Check your blood glucose before exercising. If blood glucose levels are greater than 240 mg/dL, check for urine ketones. Do not exercise if ketones are present.  Avoid injecting insulin into areas of the body that are going to be  exercised. For example, avoid injecting insulin into:  The arms when playing tennis.  The legs when jogging.  Keep a record of:  Food intake before and after you exercise.  Expected peak times of insulin action.  Blood glucose levels before and after you exercise.  The type and amount of exercise you have done.  Review your records with your health care provider. Your health care provider will help you to develop guidelines for adjusting food intake and insulin amounts before and after exercising.  If you take insulin or oral hypoglycemic agents, watch for signs and symptoms of hypoglycemia. They include:  Dizziness.  Shaking.  Sweating.  Chills.  Confusion.  Drink plenty of water while you exercise to prevent dehydration or heat stroke. Body water is lost during exercise and must be replaced.  Talk to your health care provider before starting an exercise program to make sure it is safe for you. Remember, almost any type of activity is better than none. Document Released: 02/01/2004 Document Revised: 03/28/2014 Document Reviewed: 04/20/2013 [facility] Patient Information 2015 [place], [place]. This information is not intended to replace advice given to you by your health care provider. Make sure you discuss any questions you have with your health care provider. Hypertension Hypertension, commonly called high blood pressure, is when the force of blood pumping through your arteries is too strong. Your arteries are the blood vessels that carry blood from your heart throughout your body. A blood pressure reading consists of a higher number over a lower number, such as 110/72. The higher number (systolic) is the pressure inside your arteries when your heart pumps. The lower number (diastolic) is the pressure inside your arteries when your heart relaxes. Ideally you want your blood pressure below 120/80. Hypertension forces your heart to work harder to pump blood. Your arteries may become  narrow or stiff. Having hypertension puts you at risk for heart disease, stroke, and other problems.  RISK FACTORS Some risk factors for high blood pressure are controllable. Others are not.  Risk factors you cannot control include:   Race. You may be at higher risk if you are African American.  Age. Risk increases with age.  Gender. Men are at higher risk than women before age 25 years. After age 60, women are at higher risk than men. Risk factors you can control include:  Not getting enough exercise or physical activity.  Being overweight.  Getting too much fat, sugar, calories, or salt in your diet.  Drinking too much alcohol. SIGNS AND SYMPTOMS Hypertension does not usually cause signs or symptoms. Extremely high blood pressure (hypertensive crisis) may cause headache, anxiety, shortness of breath, and nosebleed. DIAGNOSIS  To check if you have hypertension, your health care provider will measure your blood pressure while you are seated, with your arm held at the level of your heart. It should be measured at least twice using the same arm. Certain conditions can cause a difference in blood pressure between your right and left arms. A blood pressure reading that is higher than normal on one occasion does not mean that  you need treatment. If one blood pressure reading is high, ask your health care provider about having it checked again. TREATMENT  Treating high blood pressure includes making lifestyle changes and possibly taking medicine. Living a healthy lifestyle can help lower high blood pressure. You may need to change some of your habits. Lifestyle changes may include:  Following the DASH diet. This diet is high in fruits, vegetables, and whole grains. It is low in salt, red meat, and added sugars.  Getting at least 2 hours of brisk physical activity every week.  Losing weight if necessary.  Not smoking.  Limiting alcoholic beverages.  Learning ways to reduce stress. If  lifestyle changes are not enough to get your blood pressure under control, your health care provider may prescribe medicine. You may need to take more than one. Work closely with your health care provider to understand the risks and benefits. HOME CARE INSTRUCTIONS  Have your blood pressure rechecked as directed by your health care provider.   Take medicines only as directed by your health care provider. Follow the directions carefully. Blood pressure medicines must be taken as prescribed. The medicine does not work as well when you skip doses. Skipping doses also puts you at risk for problems.   Do not smoke.   Monitor your blood pressure at home as directed by your health care provider. SEEK MEDICAL CARE IF:   You think you are having a reaction to medicines taken.  You have recurrent headaches or feel dizzy.  You have swelling in your ankles.  You have trouble with your vision. SEEK IMMEDIATE MEDICAL CARE IF:  You develop a severe headache or confusion.  You have unusual weakness, numbness, or feel faint.  You have severe chest or abdominal pain.  You vomit repeatedly.  You have trouble breathing. MAKE SURE YOU:   Understand these instructions.  Will watch your condition.  Will get help right away if you are not doing well or get worse. Document Released: 11/11/2005 Document Revised: 03/28/2014 Document Reviewed: 09/03/2013 [facility] Patient Information 2015 [place], [place]. This information is not intended to replace advice given to you by your health care provider. Make sure you discuss any questions you have with your health care provider.

## 2014-06-23 NOTE — Progress Notes (Signed)
Patient ID: Yvonne [name], female   DOB: 1946/03/08, 68 y.o.   MRN: [id number]   Yvonne [name], is a 68 y.o. female  HFW:[id number]  OYD:[id number]  DOB - 1946-07-28  CC:  Chief Complaint  Patient presents with  . Establish Care       HPI: Yvonne [name] is a 68 y.o. female here today to establish medical care. Patient has chronic pain history is a result of lumbago, chronic arthritis involving cervical spine and other joints, hypertension, diabetes, dyslipidemia, COPD, and CHF, here today for medication refill and to establish primary medical care. She was recently fired from pain clinic due to noncompliance with rules and regulations, her blood pressure has not been controlled because she ran out of her medications. She continues to smoke cigarettes about 1 pack per day. She is not on home oxygen. She is tearful during this encounter, she claims she is depressed from her chronic medical conditions but she denies suicidal ideation or thoughts.  Patient has No headache, No chest pain, No abdominal pain - No Nausea, No new weakness tingling or numbness, No Cough - SOB.  No Known Allergies Past Medical History  Diagnosis Date  . Cervicalgia   . Lumbago   . Pain in joint, hand   . Disturbance of skin sensation   . Polyneuropathy in diabetes(357.2)   . Olecranon bursitis   . Chronic pain syndrome   . Diabetes mellitus   . Hypertension   . Hyperlipidemia   . CHF (congestive heart failure)   . Asthma   . COPD (chronic obstructive pulmonary disease)   . Blood transfusion    Current Outpatient Prescriptions on File Prior to Visit  Medication Sig Dispense Refill  . B-D INS SYR ULTRAFINE 1CC/30G 30G X 1/2" 1 ML MISC as needed.      Marland Kitchen albuterol (PROVENTIL HFA;VENTOLIN HFA) 108 (90 BASE) MCG/ACT inhaler Inhale 2 puffs into the lungs every 6 (six) hours as needed for wheezing.  1 Inhaler  2  . diclofenac sodium (VOLTAREN) 1 % GEL Apply 2 g topically 4 (four) times daily.  2 Tube  2  .  levocetirizine (XYZAL) 5 MG tablet Take 1 tablet by mouth Daily.      . metroNIDAZOLE (FLAGYL) 500 MG tablet       . oxyCODONE-acetaminophen (PERCOCET) 10-325 MG per tablet Take 1 tablet by mouth 4 (four) times daily.  120 tablet  0  . promethazine (PHENERGAN) 25 MG tablet Take 12.5-25 mg by mouth 3 (three) times daily as needed. For nausea/vomiting      . PROVENTIL HFA 108 (90 BASE) MCG/ACT inhaler       . zolpidem (AMBIEN) 10 MG tablet Take 1 tablet (10 mg total) by mouth at bedtime.  15 tablet  0   No current facility-administered medications on file prior to visit.   History reviewed. No pertinent family history. History   Social History  . Marital Status: Widowed    Spouse Name: N/A    Number of Children: N/A  . Years of Education: N/A   Occupational History  . Not on file.   Social History Main Topics  . Smoking status: Current Some Day Smoker -- 1.00 packs/day for 30 years    Types: Cigarettes    Last Attempt to Quit: 02/24/2012  . Smokeless tobacco: Never Used     Comment: was dx with COPD  . Alcohol Use: No  . Drug Use: No  . Sexual Activity: Not on file   Other Topics  Concern  . Not on file   Social History Narrative   Lives at home with her son. Still ambulatory without cane or walker    Review of Systems: Constitutional: Negative for fever, chills, diaphoresis, activity change, appetite change and fatigue. HENT: Negative for ear pain, nosebleeds, congestion, facial swelling, rhinorrhea, neck pain, neck stiffness and ear discharge.  Eyes: Negative for pain, discharge, redness, itching and visual disturbance. Respiratory: Negative for cough, choking, chest tightness, shortness of breath, wheezing and stridor.  Cardiovascular: Negative for chest pain, palpitations and leg swelling. Gastrointestinal: Negative for abdominal distention. Genitourinary: Negative for dysuria, urgency, frequency, hematuria, flank pain, decreased urine volume, difficulty urinating and  dyspareunia.  Musculoskeletal: Negative for back pain, joint swelling, arthralgia and gait problem. Neurological: Negative for dizziness, tremors, seizures, syncope, facial asymmetry, speech difficulty, weakness, light-headedness, numbness and headaches.  Hematological: Negative for adenopathy. Does not bruise/bleed easily. Psychiatric/Behavioral: Negative for hallucinations, behavioral problems, confusion, dysphoric mood, decreased concentration and agitation.    Objective:   Filed Vitals:   06/23/14 1514  BP: 175/79  Pulse: 92  Temp: 98.6 F (37 C)  Resp: 16    Physical Exam: Constitutional: Patient appears well-developed and well-nourished. No distress. HENT: Normocephalic, atraumatic, External right and left ear normal. Oropharynx is clear and moist.  Eyes: Conjunctivae and EOM are normal. PERRLA, no scleral icterus. Neck: Normal ROM. Neck supple. No JVD. No tracheal deviation. No thyromegaly. CVS: RRR, S1/S2 +, no murmurs, no gallops, no carotid bruit.  Pulmonary: Effort and breath sounds normal, no stridor, rhonchi, wheezes, rales.  Abdominal: Soft. BS +, no distension, tenderness, rebound or guarding.  Musculoskeletal: Normal range of motion. No edema and no tenderness.  Lymphadenopathy: No lymphadenopathy noted, cervical, inguinal or axillary Neuro: Alert. Normal reflexes, muscle tone coordination. No cranial nerve deficit. Skin: Skin is warm and dry. No rash noted. Not diaphoretic. No erythema. No pallor. Psychiatric: Normal mood and affect. Behavior, judgment, thought content normal.  Lab Results  Component Value Date   WBC 15.5* 02/03/2012   HGB 10.6* 02/03/2012   HCT 33.8* 02/03/2012   MCV 76.1* 02/03/2012   PLT 201 02/03/2012   Lab Results  Component Value Date   CREATININE 0.85 02/03/2012   BUN 13 02/03/2012   NA 139 02/03/2012   K 4.8 02/03/2012   CL 105 02/03/2012   CO2 28 02/03/2012    Lab Results  Component Value Date   HGBA1C 8.3 06/23/2014   Lipid Panel       Component Value Date/Time   CHOL 224* 04/03/2011 0356   TRIG 249* 04/03/2011 0356   HDL 43 04/03/2011 0356   CHOLHDL 5.2 04/03/2011 0356   VLDL 50* 04/03/2011 0356   LDLCALC  Value: 131        Total Cholesterol/HDL:CHD Risk Coronary Heart Disease Risk Table                     Men   Women  1/2 Average Risk   3.4   3.3  Average Risk       5.0   4.4  2 X Average Risk   9.6   7.1  3 X Average Risk  23.4   11.0        Use the calculated Patient Ratio above and the CHD Risk Table to determine the patient's CHD Risk.        ATP III CLASSIFICATION (LDL):  <100     mg/dL   Optimal  100-129  mg/dL  Near or Above                    Optimal  130-159  mg/dL   Borderline  160-189  mg/dL   High  >190     mg/dL   Very High* 04/03/2011 0356   Lab Results  Component Value Date   HGBA1C 8.3 06/23/2014   HGBA1C  Value: 10.5 (NOTE)                                                                       According to the ADA Clinical Practice Recommendations for 2011, when HbA1c is used as a screening test:   >=6.5%   Diagnostic of Diabetes Mellitus           (if abnormal result  is confirmed)  5.7-6.4%   Increased risk of developing Diabetes Mellitus  References:Diagnosis and Classification of Diabetes Mellitus,Diabetes Care,2011,34(Suppl 1):S62-S69 and Standards of Medical Care in         Diabetes - 2011,Diabetes LNLG,9211,94  (Suppl 1):S11-S61.* 04/03/2011   HGBA1C  Value: 6.6 (NOTE)   The ADA recommends the following therapeutic goals for glycemic   control related to Hgb A1C measurement:   Goal of Therapy:   < 7.0% Hgb A1C   Action Suggested:  > 8.0% Hgb A1C   Ref:  Diabetes Care, 22, Suppl. 1, 1999* 05/08/2008    Assessment and plan:   1. Type 2 diabetes mellitus without complication  - Glucose (CBG) - HgB A1c  - metFORMIN (GLUCOPHAGE) 500 MG tablet; Take 1 tablet (500 mg total) by mouth 2 (two) times daily with a meal.  Dispense: 180 tablet; Refill: 3 - insulin glargine (LANTUS) 100 UNIT/ML injection; Inject 0.2 mLs  (20 Units total) into the skin at bedtime. Inject 20 units daily per sliding scale.  Dispense: 10 mL; Refill: 3  2. Depression  - mirtazapine (REMERON) 30 MG tablet; Take 1 tablet (30 mg total) by mouth at bedtime.  Dispense: 30 tablet; Refill: 0 - clonazePAM (KLONOPIN) 1 MG tablet; Take 1 tablet (1 mg total) by mouth at bedtime. For anxiety.  Dispense: 30 tablet; Refill: 0 - citalopram (CELEXA) 40 MG tablet; Take 1 tablet (40 mg total) by mouth daily. Family unsure of dose  Dispense: 30 tablet; Refill: 3  Patient will be referred to a psychiatrist for ongoing prescription of clonazepam and other medications needed. We may not prescribe clonazepam on chronic basis  3. Essential hypertension  - CBC with Differential - COMPLETE METABOLIC PANEL WITH GFR - Lipid panel - Urinalysis, Complete - TSH  4. Peripheral neuropathic pain  - meloxicam (MOBIC) 15 MG tablet; Take 1 tablet (15 mg total) by mouth daily.  Dispense: 30 tablet; Refill: 2 - gabapentin (NEURONTIN) 400 MG capsule; TAKE 1 CAPSULE (400 MG TOTAL) BY MOUTH 4 (FOUR) TIMES DAILY. If your foot pain is severe, you can take one extra tablet in the afternoon  Dispense: 180 capsule; Refill: 3 - acetaminophen-codeine (TYLENOL #3) 300-30 MG per tablet; Take 1 tablet by mouth every 4 (four) hours as needed.  Dispense: 60 tablet; Refill: 0  5. Gastroesophageal reflux disease without esophagitis  - ranitidine (ZANTAC) 150 MG capsule; Take 1 capsule (150 mg total) by mouth 2 (two)  times daily.  Dispense: 180 capsule; Refill: 3  6. Dyslipidemia  - pravastatin (PRAVACHOL) 20 MG tablet; Take 1 tablet (20 mg total) by mouth daily.  Dispense: 90 tablet; Refill: 3  Patient was extensively counseled about smoking cessation Patient will see our social worker here in 2 weeks for counseling  Return in about 3 months (around 09/23/2014) for Hemoglobin A1C and Follow up, DM, Follow up HTN, Follow up Pain and comorbidities, Generalized Anxie.  The  patient was given clear instructions to go to ER or return to medical center if symptoms don't improve, worsen or new problems develop. The patient verbalized understanding. The patient was told to call to get lab results if they haven't heard anything in the next week.     This note has been created with Surveyor, quantity. Any transcriptional errors are unintentional.    Angelica Chessman, MD, [place], [place], [place], [place]   06/23/2014, 4:37 PM

## 2014-06-24 LAB — CBC WITH DIFFERENTIAL/PLATELET
Basophils Absolute: 0.1 10*3/uL (ref 0.0–0.1)
Basophils Relative: 1 % (ref 0–1)
Eosinophils Absolute: 0.4 10*3/uL (ref 0.0–0.7)
Eosinophils Relative: 3 % (ref 0–5)
HCT: 41.4 % (ref 36.0–46.0)
Hemoglobin: 13.2 g/dL (ref 12.0–15.0)
Lymphocytes Relative: 36 % (ref 12–46)
Lymphs Abs: 4.4 10*3/uL — ABNORMAL HIGH (ref 0.7–4.0)
MCH: 23.6 pg — ABNORMAL LOW (ref 26.0–34.0)
MCHC: 31.9 g/dL (ref 30.0–36.0)
MCV: 73.9 fL — ABNORMAL LOW (ref 78.0–100.0)
Monocytes Absolute: 0.9 10*3/uL (ref 0.1–1.0)
Monocytes Relative: 7 % (ref 3–12)
Neutro Abs: 6.5 10*3/uL (ref 1.7–7.7)
Neutrophils Relative %: 53 % (ref 43–77)
Platelets: 366 10*3/uL (ref 150–400)
RBC: 5.6 MIL/uL — ABNORMAL HIGH (ref 3.87–5.11)
RDW: 14.7 % (ref 11.5–15.5)
WBC: 12.2 10*3/uL — ABNORMAL HIGH (ref 4.0–10.5)

## 2014-06-24 LAB — TSH: TSH: 1.557 u[IU]/mL (ref 0.350–4.500)

## 2014-06-30 ENCOUNTER — Other Ambulatory Visit: Payer: Commercial Managed Care - HMO

## 2014-07-04 ENCOUNTER — Telehealth: Payer: Self-pay | Admitting: *Deleted

## 2014-07-04 NOTE — Telephone Encounter (Signed)
Left message

## 2014-07-04 NOTE — Telephone Encounter (Signed)
Message copied by Joan Mayans on Mon Jul 04, 2014  9:45 AM ------      Message from: Tresa Garter      Created: Sun Jul 03, 2014 10:15 PM       Please inform patient that her laboratory tests results shows very high cholesterol level. We encouraged her to restart her Pravachol, reduce cholesterol and fat intake and engage in regular physical exercise. We also want her to have better control on her blood sugar. ------

## 2014-07-25 ENCOUNTER — Other Ambulatory Visit: Payer: Self-pay | Admitting: Internal Medicine

## 2014-07-26 ENCOUNTER — Other Ambulatory Visit: Payer: Self-pay | Admitting: Internal Medicine

## 2014-07-31 ENCOUNTER — Other Ambulatory Visit: Payer: Self-pay | Admitting: Internal Medicine

## 2014-08-05 ENCOUNTER — Other Ambulatory Visit: Payer: Self-pay | Admitting: Internal Medicine

## 2014-08-17 ENCOUNTER — Other Ambulatory Visit: Payer: Self-pay | Admitting: Internal Medicine

## 2014-08-31 ENCOUNTER — Telehealth: Payer: Self-pay | Admitting: Internal Medicine

## 2014-08-31 ENCOUNTER — Other Ambulatory Visit: Payer: Self-pay | Admitting: Internal Medicine

## 2014-08-31 NOTE — Telephone Encounter (Signed)
Pt. Needs refills on pain medication and nerve medication...pt. Did not know the name of the medications....please follow up with patient

## 2014-09-02 ENCOUNTER — Other Ambulatory Visit: Payer: Self-pay | Admitting: *Deleted

## 2014-09-02 ENCOUNTER — Telehealth: Payer: Self-pay | Admitting: Internal Medicine

## 2014-09-02 MED ORDER — CLONAZEPAM 1 MG PO TABS: 1.0000 mg | ORAL_TABLET | Freq: Every day | ORAL | Status: DC

## 2014-09-02 MED ORDER — MIRTAZAPINE 30 MG PO TABS: 30.0000 mg | ORAL_TABLET | Freq: Every day | ORAL | Status: DC

## 2014-09-02 MED ORDER — ACETAMINOPHEN-CODEINE #3 300-30 MG PO TABS: 1.0000 | ORAL_TABLET | Freq: Three times a day (TID) | ORAL | Status: DC | PRN

## 2014-09-02 NOTE — Telephone Encounter (Signed)
Patient calling to request med refill on the following medications:  acetaminophen-codeine (TYLENOL #3)    mirtazapine (REMERON) 30 MG tablet And her nerve pills.  Please assist patient.

## 2014-09-26 ENCOUNTER — Other Ambulatory Visit: Payer: Self-pay | Admitting: Internal Medicine

## 2014-09-27 ENCOUNTER — Ambulatory Visit: Payer: Commercial Managed Care - HMO | Admitting: Internal Medicine

## 2014-10-03 ENCOUNTER — Encounter: Payer: Self-pay | Admitting: Internal Medicine

## 2014-10-03 ENCOUNTER — Ambulatory Visit: Payer: Commercial Managed Care - HMO | Attending: Internal Medicine | Admitting: Internal Medicine

## 2014-10-03 VITALS — BP 166/76 | HR 87 | Temp 98.4°F | Resp 18 | Ht 62.0 in | Wt 254.0 lb

## 2014-10-03 DIAGNOSIS — J449 Chronic obstructive pulmonary disease, unspecified: Secondary | ICD-10-CM | POA: Insufficient documentation

## 2014-10-03 DIAGNOSIS — G629 Polyneuropathy, unspecified: Secondary | ICD-10-CM

## 2014-10-03 DIAGNOSIS — J45909 Unspecified asthma, uncomplicated: Secondary | ICD-10-CM | POA: Diagnosis not present

## 2014-10-03 DIAGNOSIS — F419 Anxiety disorder, unspecified: Secondary | ICD-10-CM | POA: Diagnosis not present

## 2014-10-03 DIAGNOSIS — E785 Hyperlipidemia, unspecified: Secondary | ICD-10-CM | POA: Insufficient documentation

## 2014-10-03 DIAGNOSIS — M792 Neuralgia and neuritis, unspecified: Secondary | ICD-10-CM | POA: Diagnosis not present

## 2014-10-03 DIAGNOSIS — Z23 Encounter for immunization: Secondary | ICD-10-CM | POA: Insufficient documentation

## 2014-10-03 DIAGNOSIS — E119 Type 2 diabetes mellitus without complications: Secondary | ICD-10-CM | POA: Insufficient documentation

## 2014-10-03 DIAGNOSIS — I10 Essential (primary) hypertension: Secondary | ICD-10-CM | POA: Diagnosis not present

## 2014-10-03 DIAGNOSIS — Z794 Long term (current) use of insulin: Secondary | ICD-10-CM | POA: Diagnosis not present

## 2014-10-03 DIAGNOSIS — Z79891 Long term (current) use of opiate analgesic: Secondary | ICD-10-CM | POA: Insufficient documentation

## 2014-10-03 DIAGNOSIS — Z0000 Encounter for general adult medical examination without abnormal findings: Secondary | ICD-10-CM

## 2014-10-03 LAB — POCT GLYCOSYLATED HEMOGLOBIN (HGB A1C): Hemoglobin A1C: 9.7

## 2014-10-03 LAB — GLUCOSE, POCT (MANUAL RESULT ENTRY): POC Glucose: 246 mg/dl — AB (ref 70–99)

## 2014-10-03 MED ORDER — CLONAZEPAM 1 MG PO TABS: 1.0000 mg | ORAL_TABLET | Freq: Every day | ORAL | Status: DC

## 2014-10-03 MED ORDER — MELOXICAM 15 MG PO TABS: 15.0000 mg | ORAL_TABLET | Freq: Every day | ORAL | Status: DC

## 2014-10-03 MED ORDER — ACETAMINOPHEN-CODEINE #3 300-30 MG PO TABS: 1.0000 | ORAL_TABLET | Freq: Three times a day (TID) | ORAL | Status: DC | PRN

## 2014-10-03 NOTE — Patient Instructions (Signed)
Generalized Anxiety Disorder Generalized anxiety disorder (GAD) is a mental disorder. It interferes with life functions, including relationships, work, and school. GAD is different from normal anxiety, which everyone experiences at some point in their lives in response to specific life events and activities. Normal anxiety actually helps [name] prepare for and get through these life events and activities. Normal anxiety goes away after the event or activity is over.  GAD causes anxiety that is not necessarily related to specific events or activities. It also causes excess anxiety in proportion to specific events or activities. The anxiety associated with GAD is also difficult to control. GAD can vary from mild to severe. People with severe GAD can have intense waves of anxiety with physical symptoms (panic attacks).  SYMPTOMS The anxiety and worry associated with GAD are difficult to control. This anxiety and worry are related to many life events and activities and also occur more days than not for 6 months or longer. People with GAD also have three or more of the following symptoms (one or more in children):  Restlessness.   Fatigue.  Difficulty concentrating.   Irritability.  Muscle tension.  Difficulty sleeping or unsatisfying sleep. DIAGNOSIS GAD is diagnosed through an assessment by your health care provider. Your health care provider will ask you questions aboutyour mood,physical symptoms, and events in your life. Your health care provider may ask you about your medical history and use of alcohol or drugs, including prescription medicines. Your health care provider may also do a physical exam and blood tests. Certain medical conditions and the use of certain substances can cause symptoms similar to those associated with GAD. Your health care provider may refer you to a mental health specialist for further evaluation. TREATMENT The following therapies are usually used to treat GAD:    Medication. Antidepressant medication usually is prescribed for long-term daily control. Antianxiety medicines may be added in severe cases, especially when panic attacks occur.   Talk therapy (psychotherapy). Certain types of talk therapy can be helpful in treating GAD by providing support, education, and guidance. A form of talk therapy called cognitive behavioral therapy can teach you healthy ways to think about and react to daily life events and activities.  Stress managementtechniques. These include yoga, meditation, and exercise and can be very helpful when they are practiced regularly. A mental health specialist can help determine which treatment is best for you. Some people see improvement with one therapy. However, other people require a combination of therapies. Document Released: 03/08/2013 Document Revised: 03/28/2014 Document Reviewed: 03/08/2013 [facility] Patient Information 2015 [place], [place]. This information is not intended to replace advice given to you by your health care provider. Make sure you discuss any questions you have with your health care provider. Diabetes and Exercise Exercising regularly is important. It is not just about losing weight. It has many health benefits, such as:  Improving your overall fitness, flexibility, and endurance.  Increasing your bone density.  Helping with weight control.  Decreasing your body fat.  Increasing your muscle strength.  Reducing stress and tension.  Improving your overall health. People with diabetes who exercise gain additional benefits because exercise:  Reduces appetite.  Improves the body's use of blood sugar (glucose).  Helps lower or control blood glucose.  Decreases blood pressure.  Helps control blood lipids (such as cholesterol and triglycerides).  Improves the body's use of the hormone insulin by:  Increasing the body's insulin sensitivity.  Reducing the body's insulin needs.  Decreases the  risk for  heart disease because exercising:  Lowers cholesterol and triglycerides levels.  Increases the levels of good cholesterol (such as high-density lipoproteins [HDL]) in the body.  Lowers blood glucose levels. YOUR ACTIVITY PLAN  Choose an activity that you enjoy and set realistic goals. Your health care provider or diabetes educator can help you make an activity plan that works for you. Exercise regularly as directed by your health care provider. This includes:  Performing resistance training twice a week such as push-ups, sit-ups, lifting weights, or using resistance bands.  Performing 150 minutes of cardio exercises each week such as walking, running, or playing sports.  Staying active and spending no more than 90 minutes at one time being inactive. Even short bursts of exercise are good for you. Three 10-minute sessions spread throughout the day are just as beneficial as a single 30-minute session. Some exercise ideas include:  Taking the dog for a walk.  Taking the stairs instead of the elevator.  Dancing to your favorite song.  Doing an exercise video.  Doing your favorite exercise with a friend. RECOMMENDATIONS FOR EXERCISING WITH TYPE 1 OR TYPE 2 DIABETES   Check your blood glucose before exercising. If blood glucose levels are greater than 240 mg/dL, check for urine ketones. Do not exercise if ketones are present.  Avoid injecting insulin into areas of the body that are going to be exercised. For example, avoid injecting insulin into:  The arms when playing tennis.  The legs when jogging.  Keep a record of:  Food intake before and after you exercise.  Expected peak times of insulin action.  Blood glucose levels before and after you exercise.  The type and amount of exercise you have done.  Review your records with your health care provider. Your health care provider will help you to develop guidelines for adjusting food intake and insulin amounts before  and after exercising.  If you take insulin or oral hypoglycemic agents, watch for signs and symptoms of hypoglycemia. They include:  Dizziness.  Shaking.  Sweating.  Chills.  Confusion.  Drink plenty of water while you exercise to prevent dehydration or heat stroke. Body water is lost during exercise and must be replaced.  Talk to your health care provider before starting an exercise program to make sure it is safe for you. Remember, almost any type of activity is better than none. Document Released: 02/01/2004 Document Revised: 03/28/2014 Document Reviewed: 04/20/2013 [facility] Patient Information 2015 [place], [place]. This information is not intended to replace advice given to you by your health care provider. Make sure you discuss any questions you have with your health care provider. Diabetes Mellitus and Food It is important for you to manage your blood sugar (glucose) level. Your blood glucose level can be greatly affected by what you eat. Eating healthier foods in the appropriate amounts throughout the day at about the same time each day will help you control your blood glucose level. It can also help slow or prevent worsening of your diabetes mellitus. Healthy eating may even help you improve the level of your blood pressure and reach or maintain a healthy weight.  HOW CAN FOOD AFFECT ME? Carbohydrates Carbohydrates affect your blood glucose level more than any other type of food. Your dietitian will help you determine how many carbohydrates to eat at each meal and teach you how to count carbohydrates. Counting carbohydrates is important to keep your blood glucose at a healthy level, especially if you are using insulin or taking certain medicines for  diabetes mellitus. Alcohol Alcohol can cause sudden decreases in blood glucose (hypoglycemia), especially if you use insulin or take certain medicines for diabetes mellitus. Hypoglycemia can be a life-threatening condition. Symptoms of  hypoglycemia (sleepiness, dizziness, and disorientation) are similar to symptoms of having too much alcohol.  If your health care provider has given you approval to drink alcohol, do so in moderation and use the following guidelines:  Women should not have more than one drink per day, and men should not have more than two drinks per day. One drink is equal to:  12 oz of beer.  5 oz of wine.  1 oz of hard liquor.  Do not drink on an empty stomach.  Keep yourself hydrated. Have water, diet soda, or unsweetened iced tea.  Regular soda, juice, and other mixers might contain a lot of carbohydrates and should be counted. WHAT FOODS ARE NOT RECOMMENDED? As you make food choices, it is important to remember that all foods are not the same. Some foods have fewer nutrients per serving than other foods, even though they might have the same number of calories or carbohydrates. It is difficult to get your body what it needs when you eat foods with fewer nutrients. Examples of foods that you should avoid that are high in calories and carbohydrates but low in nutrients include:  Trans fats (most processed foods list trans fats on the Nutrition Facts label).  Regular soda.  Juice.  Candy.  Sweets, such as cake, pie, doughnuts, and cookies.  Fried foods. WHAT FOODS CAN I EAT? Have nutrient-rich foods, which will nourish your body and keep you healthy. The food you should eat also will depend on several factors, including:  The calories you need.  The medicines you take.  Your weight.  Your blood glucose level.  Your blood pressure level.  Your cholesterol level. You also should eat a variety of foods, including:  Protein, such as meat, poultry, fish, tofu, nuts, and seeds (lean animal proteins are best).  Fruits.  Vegetables.  Dairy products, such as milk, cheese, and yogurt (low fat is best).  Breads, grains, pasta, cereal, rice, and beans.  Fats such as olive oil, trans  fat-free margarine, canola oil, avocado, and olives. DOES EVERYONE WITH DIABETES MELLITUS HAVE THE SAME MEAL PLAN? Because every person with diabetes mellitus is different, there is not one meal plan that works for everyone. It is very important that you meet with a dietitian who will help you create a meal plan that is just right for you. Document Released: 08/08/2005 Document Revised: 11/16/2013 Document Reviewed: 10/08/2013 [facility] Patient Information 2015 [place], [place]. This information is not intended to replace advice given to you by your health care provider. Make sure you discuss any questions you have with your health care provider.

## 2014-10-03 NOTE — Progress Notes (Signed)
Pt comes in to f/u Diabetes,Htn with medication management Pt is very tearful today. States she found her 70 yr old son dead in the bathroom 2 mnths ago States since then things are really bad for her Marena Chancy of correct Lantus injections or oral meds [id number], A1c 9.7 Denies chest pain,sob or swelling Need medication refills Flu/PNa vaccine given

## 2014-10-03 NOTE — Progress Notes (Signed)
Patient ID: Yvonne [name], female   DOB: 1946/01/05, 68 y.o.   MRN: [id number]   Yvonne [name], is a 68 y.o. female  HFG:[id number]  CEY:[id number]  DOB - November 04, 1946  Chief Complaint  Patient presents with  . Follow-up  . Hypertension  . Hyperlipidemia  . Diabetes        Subjective:   Yvonne [name] is a 68 y.o. female here today for a follow up visit. Patient with extensive medical history as listed below came to clinic today to f/u Diabetes, Htn and medication management. Pt is very tearful today. States she found her 27 yr old son dead in the bathroom 2 mnths ago. States since then things are really bad for her. Unsure of correct Lantus injections or oral meds. CBG-246, A1c 9.7 today. Denies chest pain,sob or swelling. Need medication refills. Patient has No headache, No chest pain, No abdominal pain - No Nausea, No new weakness tingling or numbness, No Cough - SOB.  Problem  Anxiety Disorder  Preventative Health Care    ALLERGIES: No Known Allergies  PAST MEDICAL HISTORY: Past Medical History  Diagnosis Date  . Cervicalgia   . Lumbago   . Pain in joint, hand   . Disturbance of skin sensation   . Polyneuropathy in diabetes(357.2)   . Olecranon bursitis   . Chronic pain syndrome   . Diabetes mellitus   . Hypertension   . Hyperlipidemia   . CHF (congestive heart failure)   . Asthma   . COPD (chronic obstructive pulmonary disease)   . Blood transfusion     MEDICATIONS AT HOME: Prior to Admission medications   Medication Sig Start Date End Date Taking? Authorizing Provider  acetaminophen-codeine (TYLENOL #3) 300-30 MG per tablet Take 1 tablet by mouth every 8 (eight) hours as needed for moderate pain. 10/03/14  Yes Tresa Garter, MD  citalopram (CELEXA) 40 MG tablet Take 1 tablet (40 mg total) by mouth daily. Family unsure of dose 06/23/14  Yes  E Doreene Burke, MD  clonazePAM (KLONOPIN) 1 MG tablet Take 1 tablet (1 mg total) by mouth daily. 10/03/14  Yes   Essie Christine, MD  gabapentin (NEURONTIN) 400 MG capsule TAKE 1 CAPSULE (400 MG TOTAL) BY MOUTH 4 (FOUR) TIMES DAILY. If your foot pain is severe, you can take one extra tablet in the afternoon 06/23/14  Yes  E , MD  insulin glargine (LANTUS) 100 UNIT/ML injection Inject 0.2 mLs (20 Units total) into the skin at bedtime. Inject 20 units daily per sliding scale. 06/23/14  Yes Tresa Garter, MD  metFORMIN (GLUCOPHAGE) 500 MG tablet Take 1 tablet (500 mg total) by mouth 2 (two) times daily with a meal. 06/23/14  Yes Tresa Garter, MD  pravastatin (PRAVACHOL) 20 MG tablet Take 1 tablet (20 mg total) by mouth daily. 06/23/14  Yes Tresa Garter, MD  ranitidine (ZANTAC) 150 MG capsule Take 1 capsule (150 mg total) by mouth 2 (two) times daily. 06/23/14  Yes Tresa Garter, MD  zolpidem (AMBIEN) 10 MG tablet Take 1 tablet (10 mg total) by mouth at bedtime. 02/05/12  Yes Estela Leonie Green, MD  albuterol (PROVENTIL HFA;VENTOLIN HFA) 108 (90 BASE) MCG/ACT inhaler Inhale 2 puffs into the lungs every 6 (six) hours as needed for wheezing. 02/05/12 06/07/13  Erline Hau, MD  B-D INS SYR ULTRAFINE 1CC/30G 30G X 1/2" 1 ML MISC as needed. 07/20/12   Historical Provider, MD  diclofenac sodium (VOLTAREN) 1 % GEL Apply 2 g  topically 4 (four) times daily. 02/09/13   Santiago Glad Prueter, PA-C  glucose monitoring kit (FREESTYLE) monitoring kit 1 each by Does not apply route as needed for other. Test blood sugar 3 times daily 06/23/14   Tresa Garter, MD  levocetirizine (XYZAL) 5 MG tablet Take 1 tablet by mouth Daily. 06/25/12   Historical Provider, MD  meloxicam (MOBIC) 15 MG tablet Take 1 tablet (15 mg total) by mouth daily. 10/03/14   Tresa Garter, MD  metroNIDAZOLE (FLAGYL) 500 MG tablet  07/08/13   Historical Provider, MD  mirtazapine (REMERON) 30 MG tablet Take 1 tablet (30 mg total) by mouth at bedtime. 09/02/14   Tresa Garter, MD    oxyCODONE-acetaminophen (PERCOCET) 10-325 MG per tablet Take 1 tablet by mouth 4 (four) times daily. 08/10/13   Santiago Glad Prueter, PA-C  promethazine (PHENERGAN) 25 MG tablet Take 12.5-25 mg by mouth 3 (three) times daily as needed. For nausea/vomiting    Historical Provider, MD  PROVENTIL HFA 108 (90 BASE) MCG/ACT inhaler  01/02/13   Historical Provider, MD     Objective:   Filed Vitals:   10/03/14 1703  BP: 166/76  Pulse: 87  Temp: 98.4 F (36.9 C)  TempSrc: Oral  Resp: 18  Height: 5' 2" (1.575 m)  Weight: 254 lb (115.214 kg)  SpO2: 97%    Exam General appearance : Awake, alert, not in any distress. Speech Clear. Not toxic looking HEENT: Atraumatic and Normocephalic, pupils equally reactive to light and accomodation Neck: supple, no JVD. No cervical lymphadenopathy.  Chest:Good air entry bilaterally, no added sounds  CVS: S1 S2 regular, no murmurs.  Abdomen: Bowel sounds present, Non tender and not distended with no gaurding, rigidity or rebound. Extremities: B/L Lower Ext shows no edema, both legs are warm to touch Neurology: Awake alert, and oriented X 3, CN II-XII intact, Non focal   Data Review Lab Results  Component Value Date   HGBA1C 9.7 10/03/2014   HGBA1C 8.3 06/23/2014   HGBA1C * 04/03/2011    10.5 (NOTE)                                                                       According to the ADA Clinical Practice Recommendations for 2011, when HbA1c is used as a screening test:   >=6.5%   Diagnostic of Diabetes Mellitus           (if abnormal result  is confirmed)  5.7-6.4%   Increased risk of developing Diabetes Mellitus  References:Diagnosis and Classification of Diabetes Mellitus,Diabetes PHXT,0569,79(YIAXK 1):S62-S69 and Standards of Medical Care in         Diabetes - 2011,Diabetes Care,2011,34  (Suppl 1):S11-S61.     Assessment & Plan   1. Type 2 diabetes mellitus with complication  - HgB P5V is 9.7% today, up from 8.3%. We had gone through patient's  medication and reevaluated how she needs to take her insulin and oral medications for diabetes. Lantus insulin 20 units daily at bedtime and metformin 500 mg tablet by mouth twice a day. Patient verbalized understanding.  Aim for 2-3 Carb Choices per meal (30-45 grams) +/- 1 either way  Aim for 0-15 Carbs per snack if hungry  Include protein in moderation with your meals  and snacks  Consider reading food labels for Total Carbohydrate and Fat Grams of foods  Consider checking BG at alternate times per day  Continue taking medication as directed Fruit Punch - find one with no sugar  Measure and decrease portions of carbohydrate foods  Make your plate and don't go back for seconds   2. Essential hypertension  - We have discussed target BP range and blood pressure goal - I have advised patient to check BP regularly and to call us back or report to clinic if the numbers are consistently higher than 140/90  - We discussed the importance of compliance with medical therapy and DASH diet recommended, consequences of uncontrolled hypertension discussed.  - continue current BP medications  3. Peripheral neuropathic pain  - meloxicam (MOBIC) 15 MG tablet; Take 1 tablet (15 mg total) by mouth daily.  Dispense: 30 tablet; Refill: 2 - acetaminophen-codeine (TYLENOL #3) 300-30 MG per tablet; Take 1 tablet by mouth every 8 (eight) hours as needed for moderate pain.  Dispense: 60 tablet; Refill: 0  4. Anxiety disorder, unspecified anxiety disorder type Patient was referred to our medical social worker for counseling and emotional support, will give her a short course of anxiolytic.  - clonazePAM (KLONOPIN) 1 MG tablet; Take 1 tablet (1 mg total) by mouth daily.  Dispense: 30 tablet; Refill: 0  5. Preventative health care  - Ambulatory referral to Psychiatry - Ambulatory referral to Gastroenterology - MM Digital Screening; Future - HM COLONOSCOPY  Return in about 3 months (around 01/03/2015), or if  symptoms worsen or fail to improve, for Hemoglobin A1C and Follow up, DM, Follow up HTN.  The patient was given clear instructions to go to ER or return to medical center if symptoms don't improve, worsen or new problems develop. The patient verbalized understanding. The patient was told to call to get lab results if they haven't heard anything in the next week.   This note has been created with Surveyor, quantity. Any transcriptional errors are unintentional.    Angelica Chessman, MD, [place], [place], [place] and [place], [place]   10/03/2014, 5:32 PM

## 2014-10-05 ENCOUNTER — Telehealth: Payer: Self-pay | Admitting: Internal Medicine

## 2014-10-26 ENCOUNTER — Other Ambulatory Visit: Payer: Self-pay | Admitting: Internal Medicine

## 2014-10-27 ENCOUNTER — Other Ambulatory Visit: Payer: Self-pay

## 2014-10-27 ENCOUNTER — Telehealth: Payer: Self-pay | Admitting: Internal Medicine

## 2014-10-27 NOTE — Progress Notes (Unsigned)
Patient stopped in office requesting a refill on tylenol #3 and clonazepam Patient last had filled 10/03/2014 she is about a week to soon to have refilled at this time Patient is aware

## 2014-11-22 ENCOUNTER — Other Ambulatory Visit: Payer: Self-pay | Admitting: *Deleted

## 2014-11-22 ENCOUNTER — Telehealth: Payer: Self-pay | Admitting: Internal Medicine

## 2014-11-22 DIAGNOSIS — M792 Neuralgia and neuritis, unspecified: Secondary | ICD-10-CM

## 2014-11-22 MED ORDER — MIRTAZAPINE 30 MG PO TABS: 30.0000 mg | ORAL_TABLET | Freq: Every day | ORAL | Status: DC

## 2014-11-22 MED ORDER — ACETAMINOPHEN-CODEINE #3 300-30 MG PO TABS: 1.0000 | ORAL_TABLET | Freq: Three times a day (TID) | ORAL | Status: DC | PRN

## 2014-11-22 MED ORDER — GABAPENTIN 400 MG PO CAPS: ORAL_CAPSULE | ORAL | Status: DC

## 2014-11-22 NOTE — Progress Notes (Signed)
Pt called requesting refills for neurontin, tylenol #3, and remeron. Dr. Doreene Burke approved and I sent the medications up front for pt to pick up tomorrow.

## 2014-11-22 NOTE — Telephone Encounter (Signed)
Patient is calling to request a medication refills for Tylenol #3, Klonopin 1mg , Gabapentin 400mg , and another medication but the patient does not remember the last medication and she would like to speak to nurse. Please f/u with nurse.

## 2014-12-20 ENCOUNTER — Other Ambulatory Visit: Payer: Self-pay | Admitting: Internal Medicine

## 2014-12-20 ENCOUNTER — Other Ambulatory Visit: Payer: Self-pay | Admitting: *Deleted

## 2014-12-20 DIAGNOSIS — F419 Anxiety disorder, unspecified: Secondary | ICD-10-CM

## 2014-12-20 DIAGNOSIS — M792 Neuralgia and neuritis, unspecified: Secondary | ICD-10-CM

## 2014-12-20 MED ORDER — CLONAZEPAM 1 MG PO TABS: 1.0000 mg | ORAL_TABLET | Freq: Every day | ORAL | Status: DC

## 2014-12-20 MED ORDER — GABAPENTIN 400 MG PO CAPS: ORAL_CAPSULE | ORAL | Status: DC

## 2014-12-20 MED ORDER — ACETAMINOPHEN-CODEINE #3 300-30 MG PO TABS: 1.0000 | ORAL_TABLET | Freq: Three times a day (TID) | ORAL | Status: DC | PRN

## 2014-12-20 NOTE — Progress Notes (Signed)
Pt called needing refills. I refilled these medications and told the pt that she could pick up her rx up at the front window.

## 2014-12-30 ENCOUNTER — Telehealth: Payer: Self-pay | Admitting: Emergency Medicine

## 2014-12-30 ENCOUNTER — Telehealth: Payer: Self-pay | Admitting: Internal Medicine

## 2014-12-30 NOTE — Telephone Encounter (Signed)
Left VM for pt to call regarding foot blisters

## 2014-12-30 NOTE — Telephone Encounter (Signed)
Pt is hoping to be referred [facility]. She has two big blisters that busted on each side of foot down below big toe. Please follow up with pt for advise before her appointment on Tuesday.

## 2015-01-03 ENCOUNTER — Ambulatory Visit: Payer: Commercial Managed Care - HMO | Admitting: Internal Medicine

## 2015-01-04 ENCOUNTER — Telehealth: Payer: Self-pay | Admitting: Internal Medicine

## 2015-01-04 NOTE — Telephone Encounter (Signed)
Pt stated had appointment yesterday. Advised to schedule F/U appointment with PCP

## 2015-01-04 NOTE — Telephone Encounter (Signed)
Patient called stating that she has blisters on both of her feet, patient is concerned because she is a diabetic and has been having pain. Please f/u with pt.

## 2015-01-05 ENCOUNTER — Emergency Department (HOSPITAL_COMMUNITY)
Admission: EM | Admit: 2015-01-05 | Discharge: 2015-01-05 | Disposition: A | Discharge status: Home / Self Care | Payer: Commercial Managed Care - HMO | Attending: Emergency Medicine | Admitting: Emergency Medicine

## 2015-01-05 ENCOUNTER — Encounter (HOSPITAL_COMMUNITY): Payer: Self-pay | Admitting: *Deleted

## 2015-01-05 DIAGNOSIS — L97501 Non-pressure chronic ulcer of other part of unspecified foot limited to breakdown of skin: Secondary | ICD-10-CM | POA: Insufficient documentation

## 2015-01-05 DIAGNOSIS — G894 Chronic pain syndrome: Secondary | ICD-10-CM | POA: Insufficient documentation

## 2015-01-05 DIAGNOSIS — E1142 Type 2 diabetes mellitus with diabetic polyneuropathy: Secondary | ICD-10-CM | POA: Diagnosis not present

## 2015-01-05 DIAGNOSIS — Z791 Long term (current) use of non-steroidal anti-inflammatories (NSAID): Secondary | ICD-10-CM | POA: Insufficient documentation

## 2015-01-05 DIAGNOSIS — Z79899 Other long term (current) drug therapy: Secondary | ICD-10-CM | POA: Insufficient documentation

## 2015-01-05 DIAGNOSIS — I509 Heart failure, unspecified: Secondary | ICD-10-CM | POA: Insufficient documentation

## 2015-01-05 DIAGNOSIS — E785 Hyperlipidemia, unspecified: Secondary | ICD-10-CM | POA: Diagnosis not present

## 2015-01-05 DIAGNOSIS — L989 Disorder of the skin and subcutaneous tissue, unspecified: Secondary | ICD-10-CM | POA: Diagnosis present

## 2015-01-05 DIAGNOSIS — I10 Essential (primary) hypertension: Secondary | ICD-10-CM | POA: Insufficient documentation

## 2015-01-05 DIAGNOSIS — J449 Chronic obstructive pulmonary disease, unspecified: Secondary | ICD-10-CM | POA: Diagnosis not present

## 2015-01-05 LAB — CBG MONITORING, ED: Glucose-Capillary: 271 mg/dL — ABNORMAL HIGH (ref 70–99)

## 2015-01-05 MED ORDER — BACITRACIN 500 UNIT/GM EX OINT
1.0000 "application " | TOPICAL_OINTMENT | Freq: Two times a day (BID) | CUTANEOUS | Status: DC
  Filled 2015-01-05 (×4): qty 0.9

## 2015-01-05 MED ORDER — BACITRACIN ZINC 500 UNIT/GM EX OINT: 1.0000 "application " | TOPICAL_OINTMENT | Freq: Two times a day (BID) | CUTANEOUS | Status: DC

## 2015-01-05 NOTE — ED Notes (Signed)
Bed: [facility] Expected date:  Expected time:  Means of arrival:  Comments: EMS Blisters on Feet

## 2015-01-05 NOTE — ED Provider Notes (Signed)
CSN: [id number]     Arrival date & time 01/05/15  1226 History   First MD Initiated Contact with Patient 01/05/15 1251     Chief Complaint  Patient presents with  . Skin Ulcer     (Consider location/radiation/quality/duration/timing/severity/associated sxs/prior Treatment) HPI Comments: Patient presents to the emergency department with past medical history of diabetes, with a chief complaint of bilateral blisters on her feet. She states that she noticed the blisters 2-3 days ago. She denies any discharge. She states that 2 of the large blisters burst a day or so ago. She reports only minimal pain. She denies wearing any new shoes. She denies any fevers, chills, nausea, or vomiting.  The history is provided by the patient. No language interpreter was used.    Past Medical History  Diagnosis Date  . Cervicalgia   . Lumbago   . Pain in joint, hand   . Disturbance of skin sensation   . Polyneuropathy in diabetes(357.2)   . Olecranon bursitis   . Chronic pain syndrome   . Diabetes mellitus   . Hypertension   . Hyperlipidemia   . CHF (congestive heart failure)   . Asthma   . COPD (chronic obstructive pulmonary disease)   . Blood transfusion    Past Surgical History  Procedure Laterality Date  . Back surgery      from bacteria  . Abdominal hysterectomy     No family history on file. History  Substance Use Topics  . Smoking status: Current Some Day Smoker -- 1.00 packs/day for 30 years    Types: Cigarettes    Last Attempt to Quit: 02/24/2012  . Smokeless tobacco: Never Used     Comment: was dx with COPD  . Alcohol Use: No   OB History    No data available     Review of Systems  Constitutional: Negative for fever and chills.  Respiratory: Negative for shortness of breath.   Cardiovascular: Negative for chest pain.  Gastrointestinal: Negative for nausea, vomiting, diarrhea and constipation.  Genitourinary: Negative for dysuria.  Skin: Positive for wound.  All other  systems reviewed and are negative.     Allergies  Review of patient's allergies indicates no known allergies.  Home Medications   Prior to Admission medications   Medication Sig Start Date End Date Taking? Authorizing Provider  acetaminophen-codeine (TYLENOL #3) 300-30 MG per tablet Take 1 tablet by mouth every 8 (eight) hours as needed for moderate pain. 12/20/14   Tresa Garter, MD  albuterol (PROVENTIL HFA;VENTOLIN HFA) 108 (90 BASE) MCG/ACT inhaler Inhale 2 puffs into the lungs every 6 (six) hours as needed for wheezing. 02/05/12 06/07/13  Erline Hau, MD  B-D INS SYR ULTRAFINE 1CC/30G 30G X 1/2" 1 ML MISC as needed. 07/20/12   Historical Provider, MD  citalopram (CELEXA) 40 MG tablet Take 1 tablet (40 mg total) by mouth daily. Family unsure of dose 06/23/14   Tresa Garter, MD  clonazePAM (KLONOPIN) 1 MG tablet Take 1 tablet (1 mg total) by mouth daily. 12/20/14   Tresa Garter, MD  diclofenac sodium (VOLTAREN) 1 % GEL Apply 2 g topically 4 (four) times daily. 02/09/13   Santiago Glad Prueter, PA-C  gabapentin (NEURONTIN) 400 MG capsule TAKE 1 CAPSULE (400 MG TOTAL) BY MOUTH 4 (FOUR) TIMES DAILY. If your foot pain is severe, you can take one extra tablet in the afternoon 12/20/14   Tresa Garter, MD  glucose monitoring kit (FREESTYLE) monitoring kit 1 each by  Does not apply route as needed for other. Test blood sugar 3 times daily 06/23/14   Tresa Garter, MD  LANTUS 100 UNIT/ML injection INJECT 20 UNITS UNDER THE SKIN EVERY NIGHT AT BEDTIME. 11/16/14   Tresa Garter, MD  levocetirizine (XYZAL) 5 MG tablet Take 1 tablet by mouth Daily. 06/25/12   Historical Provider, MD  meloxicam (MOBIC) 15 MG tablet Take 1 tablet (15 mg total) by mouth daily. 10/03/14   Tresa Garter, MD  metFORMIN (GLUCOPHAGE) 500 MG tablet Take 1 tablet (500 mg total) by mouth 2 (two) times daily with a meal. 06/23/14   Tresa Garter, MD  metroNIDAZOLE (FLAGYL) 500 MG tablet   07/08/13   Historical Provider, MD  mirtazapine (REMERON) 30 MG tablet Take 1 tablet (30 mg total) by mouth at bedtime. 11/22/14   Tresa Garter, MD  oxyCODONE-acetaminophen (PERCOCET) 10-325 MG per tablet Take 1 tablet by mouth 4 (four) times daily. 08/10/13   Santiago Glad Prueter, PA-C  pravastatin (PRAVACHOL) 20 MG tablet Take 1 tablet (20 mg total) by mouth daily. 06/23/14   Tresa Garter, MD  promethazine (PHENERGAN) 25 MG tablet Take 12.5-25 mg by mouth 3 (three) times daily as needed. For nausea/vomiting    Historical Provider, MD  PROVENTIL HFA 108 (90 BASE) MCG/ACT inhaler  01/02/13   Historical Provider, MD  ranitidine (ZANTAC) 150 MG capsule Take 1 capsule (150 mg total) by mouth 2 (two) times daily. 06/23/14   Tresa Garter, MD  zolpidem (AMBIEN) 10 MG tablet Take 1 tablet (10 mg total) by mouth at bedtime. 02/05/12   Erline Hau, MD   BP 126/63 mmHg  Pulse 84  Temp(Src) 98.1 F (36.7 C) (Oral)  Resp 16  SpO2 98% Physical Exam  Constitutional: She is oriented to person, place, and time. She appears well-developed and well-nourished.  HENT:  Head: Normocephalic and atraumatic.  Eyes: Conjunctivae and EOM are normal. Pupils are equal, round, and reactive to light.  Neck: Normal range of motion. Neck supple.  Cardiovascular: Normal rate and regular rhythm.  Exam reveals no gallop and no friction rub.   No murmur heard. Pulmonary/Chest: Effort normal and breath sounds normal. No respiratory distress. She has no wheezes. She has no rales. She exhibits no tenderness.  Abdominal: Soft. Bowel sounds are normal. She exhibits no distension and no mass. There is no tenderness. There is no rebound and no guarding.  Musculoskeletal: Normal range of motion. She exhibits no edema or tenderness.  Neurological: She is alert and oriented to person, place, and time.  Skin: Skin is warm and dry.  Bilateral mild ulcerations of the medial forefoot, no abscess, no cellulitis, no  discharge  Psychiatric: She has a normal mood and affect. Her behavior is normal. Judgment and thought content normal.  Nursing note and vitals reviewed.   ED Course  Procedures (including critical care time) Results for orders placed or performed during the hospital encounter of 01/05/15  CBG monitoring, ED  Result Value Ref Range   Glucose-Capillary 271 (H) 70 - 99 mg/dL   No results found.    EKG Interpretation None      MDM   Final diagnoses:  Foot ulcer, unspecified laterality, limited to breakdown of skin    Patient with mild ulcerations, appear to be blisters that have been unroofed.  No discharge, no sign of abscess or deep infection.  Patient seen by and discussed with Dr. Rogene Houston, who agrees with plan for discharge to home.  Local wound care.  Recommend warm soaks, bacitracin, and dressing changes.  Patient understands and agrees with the plan.    Montine Circle, PA-C 01/05/15 1525

## 2015-01-05 NOTE — Discharge Instructions (Signed)
Skin Ulcer  A skin ulcer is an open sore that can be shallow or deep. Skin ulcers sometimes become infected and are difficult to treat. It may be 1 month or longer before real healing progress is made.  CAUSES    Injury.   Problems with the veins or arteries.   Diabetes.   Insect bites.   Bedsores.   Inflammatory conditions.  SYMPTOMS    Pain, redness, swelling, and tenderness around the ulcer.   Fever.   Bleeding from the ulcer.   Yellow or clear fluid coming from the ulcer.  DIAGNOSIS   There are many types of skin ulcers. Any open sores will be examined. Certain tests will be done to determine the kind of ulcer you have. The right treatment depends on the type of ulcer you have.  TREATMENT   Treatment is a long-term challenge. It may include:   Wearing an elastic wrap, compression stockings, or gel cast over the ulcer area.   Taking antibiotic medicines or putting antibiotic creams on the affected area if there is an infection.  HOME CARE INSTRUCTIONS   Put on your bandages (dressings), wraps, or casts over the ulcer as directed by your caregiver.   Change all dressings as directed by your caregiver.   Take all medicines as directed by your caregiver.   Keep the affected area clean and dry.   Avoid injuries to the affected area.   Eat a well-balanced, healthy diet that includes plenty of fruit and vegetables.   If you smoke, consider quitting or decreasing the amount of cigarettes you smoke.   Once the ulcer heals, get regular exercise as directed by your caregiver.   Work with your caregiver to make sure your blood pressure, cholesterol, and diabetes are well-controlled.   Keep your skin moisturized. Dry skin can crack and lead to skin ulcers.  SEEK IMMEDIATE MEDICAL CARE IF:    Your pain gets worse.   You have swelling, redness, or fluids around the ulcer.   You have chills.   You have a fever.  MAKE SURE YOU:    Understand these instructions.   Will watch your condition.   Will get  help right away if you are not doing well or get worse.  Document Released: 12/19/2004 Document Revised: 02/03/2012 Document Reviewed: 06/28/2011  ExitCare Patient Information 2015 [employer]. This information is not intended to replace advice given to you by your health care provider. Make sure you discuss any questions you have with your health care provider.

## 2015-01-05 NOTE — ED Provider Notes (Signed)
Medical screening examination/treatment/procedure(s) were conducted as a shared visit with non-physician practitioner(s) and myself.  I personally evaluated the patient during the encounter.   EKG Interpretation None      Results for orders placed or performed during the hospital encounter of 01/05/15  CBG monitoring, ED  Result Value Ref Range   Glucose-Capillary 271 (H) 70 - 99 mg/dL    Results for orders placed or performed during the hospital encounter of 01/05/15  CBG monitoring, ED  Result Value Ref Range   Glucose-Capillary 271 (H) 70 - 99 mg/dL   No results found.  Patient seen by me. Patient with blisters that of unroofed on both feet. No evidence of secondary infection. Patient's been doing wound care at home. Patient is a diabetic however still does have feeling in her feet. Did not occur due to cold weather exposure.  Patient can be treated with local wound care recommend soaking in warm water twice a day and then dressing with Neosporin ointment and the Band-Aids. And then follow-up for further wound care. No evidence of deep space infection.  Fredia Sorrow, MD 01/05/15 [phone]

## 2015-01-05 NOTE — ED Notes (Signed)
Patient has open ulcers on the medial aspects of her feet bilaterally.  Patient states she "just woke up" five days ago with them on her feet.  Patient also has a closed blister on her medial great left toe.  Patient denies SOB, chest pain, N/V/D and dizziness.  Patient denies current peripheral edema, but states she had peripheral edema before the ulceration/blistering started.  Patient denies pain in feet or legs.  On exam, patient's lung sounds are clear in all lobes bilaterally.  Heart rr and S1/S2 noted.  No murmur.  Patient has +2 radial and pedal pulses bilaterally.  No edema noted to lower extremities currently.  Patient can wiggle digits.  Patient's skin is warm and dry.

## 2015-01-05 NOTE — ED Notes (Signed)
Per EMS - Patient comes from home where she lives alone with c/o blisters and pressure ulcers on her feet.  Patient has neuropathy.  Patient's CBG was 277 on scene.  Patient has not taken her insulin for 5 days.  Patient's BP 140/100, HR 88, 95% on RA, 20 RR.

## 2015-01-10 ENCOUNTER — Other Ambulatory Visit: Payer: Self-pay | Admitting: Internal Medicine

## 2015-01-16 ENCOUNTER — Ambulatory Visit: Payer: Commercial Managed Care - HMO | Admitting: Internal Medicine

## 2015-01-24 ENCOUNTER — Ambulatory Visit: Payer: Commercial Managed Care - HMO | Admitting: Internal Medicine

## 2015-01-31 ENCOUNTER — Telehealth: Payer: Self-pay | Admitting: Internal Medicine

## 2015-01-31 NOTE — Telephone Encounter (Signed)
Pt called regarding  gabapentin (NEURONTIN) 400 MG capsule pt states that she lost her medication and is too early for pharmacy to refill med. Pt would like her PCP to call pharmacy in order to get her meds.Please f/u with pt

## 2015-02-06 ENCOUNTER — Ambulatory Visit: Payer: Commercial Managed Care - HMO | Admitting: Internal Medicine

## 2015-02-21 ENCOUNTER — Encounter: Payer: Self-pay | Admitting: Internal Medicine

## 2015-02-21 ENCOUNTER — Ambulatory Visit: Payer: Commercial Managed Care - HMO | Attending: Internal Medicine | Admitting: Internal Medicine

## 2015-02-21 VITALS — BP 146/82 | HR 84 | Temp 98.0°F | Resp 18 | Ht 62.0 in | Wt 238.0 lb

## 2015-02-21 DIAGNOSIS — Z794 Long term (current) use of insulin: Secondary | ICD-10-CM | POA: Diagnosis not present

## 2015-02-21 DIAGNOSIS — F5105 Insomnia due to other mental disorder: Secondary | ICD-10-CM | POA: Diagnosis not present

## 2015-02-21 DIAGNOSIS — G629 Polyneuropathy, unspecified: Secondary | ICD-10-CM

## 2015-02-21 DIAGNOSIS — K219 Gastro-esophageal reflux disease without esophagitis: Secondary | ICD-10-CM

## 2015-02-21 DIAGNOSIS — F419 Anxiety disorder, unspecified: Secondary | ICD-10-CM | POA: Diagnosis not present

## 2015-02-21 DIAGNOSIS — F409 Phobic anxiety disorder, unspecified: Secondary | ICD-10-CM | POA: Diagnosis not present

## 2015-02-21 DIAGNOSIS — E119 Type 2 diabetes mellitus without complications: Secondary | ICD-10-CM | POA: Diagnosis not present

## 2015-02-21 DIAGNOSIS — E785 Hyperlipidemia, unspecified: Secondary | ICD-10-CM | POA: Insufficient documentation

## 2015-02-21 DIAGNOSIS — M792 Neuralgia and neuritis, unspecified: Secondary | ICD-10-CM

## 2015-02-21 LAB — GLUCOSE, POCT (MANUAL RESULT ENTRY): POC Glucose: 206 mg/dl — AB (ref 70–99)

## 2015-02-21 LAB — POCT GLYCOSYLATED HEMOGLOBIN (HGB A1C): Hemoglobin A1C: 10.3

## 2015-02-21 MED ORDER — CLONAZEPAM 1 MG PO TABS: 1.0000 mg | ORAL_TABLET | Freq: Every day | ORAL | Status: DC

## 2015-02-21 MED ORDER — PRAVASTATIN SODIUM 20 MG PO TABS: 20.0000 mg | ORAL_TABLET | Freq: Every day | ORAL | Status: DC

## 2015-02-21 MED ORDER — ALBUTEROL SULFATE HFA 108 (90 BASE) MCG/ACT IN AERS: 2.0000 | INHALATION_SPRAY | Freq: Four times a day (QID) | RESPIRATORY_TRACT | Status: DC | PRN

## 2015-02-21 MED ORDER — MIRTAZAPINE 30 MG PO TABS: 30.0000 mg | ORAL_TABLET | Freq: Every day | ORAL | Status: DC

## 2015-02-21 MED ORDER — RANITIDINE HCL 150 MG PO CAPS: 150.0000 mg | ORAL_CAPSULE | Freq: Two times a day (BID) | ORAL | Status: DC

## 2015-02-21 MED ORDER — ACETAMINOPHEN-CODEINE #3 300-30 MG PO TABS: 1.0000 | ORAL_TABLET | Freq: Three times a day (TID) | ORAL | Status: DC | PRN

## 2015-02-21 MED ORDER — METFORMIN HCL 1000 MG PO TABS: 1000.0000 mg | ORAL_TABLET | Freq: Two times a day (BID) | ORAL | Status: DC

## 2015-02-21 MED ORDER — GABAPENTIN 400 MG PO CAPS: ORAL_CAPSULE | ORAL | Status: DC

## 2015-02-21 NOTE — Progress Notes (Signed)
Pt here to f/u with DM,cholesterol and Depression with medical management Pt c/o neuropathy in both feet with taking Gabapentin 400 mg tab Requesting medication refills on Albuterol inhaler CBg- 206 A1c-10.3 Pt is very tearful regarding death of son  C/o Insomnia

## 2015-02-21 NOTE — Patient Instructions (Signed)
Diabetes and Exercise Exercising regularly is important. It is not just about losing weight. It has many health benefits, such as:  Improving your overall fitness, flexibility, and endurance.  Increasing your bone density.  Helping with weight control.  Decreasing your body fat.  Increasing your muscle strength.  Reducing stress and tension.  Improving your overall health. People with diabetes who exercise gain additional benefits because exercise:  Reduces appetite.  Improves the body's use of blood sugar (glucose).  Helps lower or control blood glucose.  Decreases blood pressure.  Helps control blood lipids (such as cholesterol and triglycerides).  Improves the body's use of the hormone insulin by:  Increasing the body's insulin sensitivity.  Reducing the body's insulin needs.  Decreases the risk for heart disease because exercising:  Lowers cholesterol and triglycerides levels.  Increases the levels of good cholesterol (such as high-density lipoproteins [HDL]) in the body.  Lowers blood glucose levels. YOUR ACTIVITY PLAN  Choose an activity that you enjoy and set realistic goals. Your health care provider or diabetes educator can help you make an activity plan that works for you. Exercise regularly as directed by your health care provider. This includes:  Performing resistance training twice a week such as push-ups, sit-ups, lifting weights, or using resistance bands.  Performing 150 minutes of cardio exercises each week such as walking, running, or playing sports.  Staying active and spending no more than 90 minutes at one time being inactive. Even short bursts of exercise are good for you. Three 10-minute sessions spread throughout the day are just as beneficial as a single 30-minute session. Some exercise ideas include:  Taking the dog for a walk.  Taking the stairs instead of the elevator.  Dancing to your favorite song.  Doing an exercise  video.  Doing your favorite exercise with a friend. RECOMMENDATIONS FOR EXERCISING WITH TYPE 1 OR TYPE 2 DIABETES   Check your blood glucose before exercising. If blood glucose levels are greater than 240 mg/dL, check for urine ketones. Do not exercise if ketones are present.  Avoid injecting insulin into areas of the body that are going to be exercised. For example, avoid injecting insulin into:  The arms when playing tennis.  The legs when jogging.  Keep a record of:  Food intake before and after you exercise.  Expected peak times of insulin action.  Blood glucose levels before and after you exercise.  The type and amount of exercise you have done.  Review your records with your health care provider. Your health care provider will help you to develop guidelines for adjusting food intake and insulin amounts before and after exercising.  If you take insulin or oral hypoglycemic agents, watch for signs and symptoms of hypoglycemia. They include:  Dizziness.  Shaking.  Sweating.  Chills.  Confusion.  Drink plenty of water while you exercise to prevent dehydration or heat stroke. Body water is lost during exercise and must be replaced.  Talk to your health care provider before starting an exercise program to make sure it is safe for you. Remember, almost any type of activity is better than none. Document Released: 02/01/2004 Document Revised: 03/28/2014 Document Reviewed: 04/20/2013 ExitCare Patient Information 2015 [employer]. This information is not intended to replace advice given to you by your health care provider. Make sure you discuss any questions you have with your health care provider. Basic Carbohydrate Counting for Diabetes Mellitus Carbohydrate counting is a method for keeping track of the amount of carbohydrates you eat.   Eating carbohydrates naturally increases the level of sugar (glucose) in your blood, so it is important for you to know the amount that is  okay for you to have in every meal. Carbohydrate counting helps keep the level of glucose in your blood within normal limits. The amount of carbohydrates allowed is different for every person. A dietitian can help you calculate the amount that is right for you. Once you know the amount of carbohydrates you can have, you can count the carbohydrates in the foods you want to eat. Carbohydrates are found in the following foods:  Grains, such as breads and cereals.  Dried beans and soy products.  Starchy vegetables, such as potatoes, peas, and corn.  Fruit and fruit juices.  Milk and yogurt.  Sweets and snack foods, such as cake, cookies, candy, chips, soft drinks, and fruit drinks. CARBOHYDRATE COUNTING There are two ways to count the carbohydrates in your food. You can use either of the methods or a combination of both. Reading the "Nutrition Facts" on Packaged Food The "Nutrition Facts" is an area that is included on the labels of almost all packaged food and beverages in the [place]. It includes the serving size of that food or beverage and information about the nutrients in each serving of the food, including the grams (g) of carbohydrate per serving.  Decide the number of servings of this food or beverage that you will be able to eat or drink. Multiply that number of servings by the number of grams of carbohydrate that is listed on the label for that serving. The total will be the amount of carbohydrates you will be having when you eat or drink this food or beverage. Learning Standard Serving Sizes of Food When you eat food that is not packaged or does not include "Nutrition Facts" on the label, you need to measure the servings in order to count the amount of carbohydrates.A serving of most carbohydrate-rich foods contains about 15 g of carbohydrates. The following list includes serving sizes of carbohydrate-rich foods that provide 15 g ofcarbohydrate per serving:   1 slice of bread  (1 oz) or 1 six-inch tortilla.    of a hamburger bun or English muffin.  4-6 crackers.   cup unsweetened dry cereal.    cup hot cereal.   cup rice or pasta.    cup mashed potatoes or  of a large baked potato.  1 cup fresh fruit or one small piece of fruit.    cup canned or frozen fruit or fruit juice.  1 cup milk.   cup plain fat-free yogurt or yogurt sweetened with artificial sweeteners.   cup cooked dried beans or starchy vegetable, such as peas, corn, or potatoes.  Decide the number of standard-size servings that you will eat. Multiply that number of servings by 15 (the grams of carbohydrates in that serving). For example, if you eat 2 cups of strawberries, you will have eaten 2 servings and 30 g of carbohydrates (2 servings x 15 g = 30 g). For foods such as soups and casseroles, in which more than one food is mixed in, you will need to count the carbohydrates in each food that is included. EXAMPLE OF CARBOHYDRATE COUNTING Sample Dinner  3 oz chicken breast.   cup of brown rice.   cup of corn.  1 cup milk.   1 cup strawberries with sugar-free whipped topping.  Carbohydrate Calculation Step 1: Identify the foods that contain carbohydrates:   Rice.   Corn.     Milk.   Strawberries. Step 2:Calculate the number of servings eaten of each:   2 servings of rice.   1 serving of corn.   1 serving of milk.   1 serving of strawberries. Step 3: Multiply each of those number of servings by 15 g:   2 servings of rice x 15 g = 30 g.   1 serving of corn x 15 g = 15 g.   1 serving of milk x 15 g = 15 g.   1 serving of strawberries x 15 g = 15 g. Step 4: Add together all of the amounts to find the total grams of carbohydrates eaten: 30 g + 15 g + 15 g + 15 g = 75 g. Document Released: 11/11/2005 Document Revised: 03/28/2014 Document Reviewed: 10/08/2013 ExitCare Patient Information 2015 [employer]. This information is not intended to  replace advice given to you by your health care provider. Make sure you discuss any questions you have with your health care provider.  

## 2015-02-21 NOTE — Progress Notes (Signed)
Patient ID: Yvonne [name], female   DOB: [birth year], 69 y.o.   MRN: [id number]   Yvonne [name], is a 69 y.o. female  PIR:[id number]  YTK:[id number]  DOB - 15-Apr-1946  Chief Complaint  Patient presents with  . Follow-up  . Diabetes  . Hypertension        Subjective:   Yvonne [name] is a 69 y.o. female here today for a follow up visit. Patient has diabetes mellitus, hypertension, hyperlipidemia and diabetic neuropathy associated with chronic pain syndrome. She is here today for follow-up of diabetes and she needs medication for anxiety and depression. She also needs medication refills. Patient is currently on 400 mg tablet by mouth 3 times a day gabapentin for diabetic neuropathy. She is complaining of insomnia and she is severely depressed, tearful during this encounter, she reported that her son passed away recently. She denies any suicidal ideation or thoughts. She had told me in November 2015 about her son passing away and that contributed to her anxiety. Patient has No headache, No chest pain, No abdominal pain - No Nausea, No new weakness tingling or numbness, No Cough - SOB.  No problems updated.  ALLERGIES: No Known Allergies  PAST MEDICAL HISTORY: Past Medical History  Diagnosis Date  . Cervicalgia   . Lumbago   . Pain in joint, hand   . Disturbance of skin sensation   . Polyneuropathy in diabetes(357.2)   . Olecranon bursitis   . Chronic pain syndrome   . Diabetes mellitus   . Hypertension   . Hyperlipidemia   . CHF (congestive heart failure)   . Asthma   . COPD (chronic obstructive pulmonary disease)   . Blood transfusion     MEDICATIONS AT HOME: Prior to Admission medications   Medication Sig Start Date End Date Taking? Authorizing Provider  clonazePAM (KLONOPIN) 1 MG tablet Take 1 tablet (1 mg total) by mouth daily. 02/21/15  Yes Karma Ansley Essie Christine, MD  gabapentin (NEURONTIN) 400 MG capsule TAKE 1 CAPSULE (400 MG TOTAL) BY MOUTH 4 (FOUR) TIMES DAILY. If your  foot pain is severe, you can take one extra tablet in the afternoon 02/21/15  Yes Tresa Garter, MD  metFORMIN (GLUCOPHAGE) 1000 MG tablet Take 1 tablet (1,000 mg total) by mouth 2 (two) times daily with a meal. 02/21/15  Yes Tresa Garter, MD  pravastatin (PRAVACHOL) 20 MG tablet Take 1 tablet (20 mg total) by mouth daily. 02/21/15  Yes Tresa Garter, MD  ranitidine (ZANTAC) 150 MG capsule Take 1 capsule (150 mg total) by mouth 2 (two) times daily. 02/21/15  Yes Tresa Garter, MD  acetaminophen-codeine (TYLENOL #3) 300-30 MG per tablet Take 1 tablet by mouth every 8 (eight) hours as needed for moderate pain. 02/21/15   Tresa Garter, MD  albuterol (PROVENTIL HFA;VENTOLIN HFA) 108 (90 BASE) MCG/ACT inhaler Inhale 2 puffs into the lungs every 6 (six) hours as needed for wheezing. 02/21/15 06/23/16  Tresa Garter, MD  bacitracin ointment Apply 1 application topically 2 (two) times daily. Patient not taking: Reported on 02/21/2015 01/05/15   Montine Circle, PA-C  diclofenac sodium (VOLTAREN) 1 % GEL Apply 2 g topically 4 (four) times daily. Patient not taking: Reported on 02/21/2015 02/09/13   Aundria Mems, PA-C  glucose monitoring kit (FREESTYLE) monitoring kit 1 each by Does not apply route as needed for other. Test blood sugar 3 times daily 06/23/14   Tresa Garter, MD  LANTUS 100 UNIT/ML injection INJECT 20 UNITS UNDER THE SKIN  EVERY NIGHT AT BEDTIME. 11/16/14   Tresa Garter, MD  meloxicam (MOBIC) 15 MG tablet Take 1 tablet (15 mg total) by mouth daily. Patient not taking: Reported on 01/05/2015 10/03/14   Tresa Garter, MD  mirtazapine (REMERON) 30 MG tablet Take 1 tablet (30 mg total) by mouth at bedtime. 02/21/15   Tresa Garter, MD  oxyCODONE-acetaminophen (PERCOCET) 10-325 MG per tablet Take 1 tablet by mouth 4 (four) times daily. Patient not taking: Reported on 02/21/2015 08/10/13   Aundria Mems, PA-C     Objective:   Filed Vitals:    02/21/15 1200  BP: 146/82  Pulse: 84  Temp: 98 F (36.7 C)  TempSrc: Oral  Resp: 18  Height: 5' 2"  (1.575 m)  Weight: 238 lb (107.956 kg)  SpO2: 99%    Exam General appearance : Awake, alert, not in any distress. Speech Clear. Not toxic looking HEENT: Atraumatic and Normocephalic, pupils equally reactive to light and accomodation Neck: supple, no JVD. No cervical lymphadenopathy.  Chest:Good air entry bilaterally, no added sounds  CVS: S1 S2 regular, no murmurs.  Abdomen: Bowel sounds present, Non tender and not distended with no gaurding, rigidity or rebound. Extremities: B/L Lower Ext shows no edema, both legs are warm to touch Neurology: Awake alert, and oriented X 3, CN II-XII intact, Non focal Skin:No Rash  Data Review Lab Results  Component Value Date   HGBA1C 10.30 02/21/2015   HGBA1C 9.7 10/03/2014   HGBA1C 8.3 06/23/2014     Assessment & Plan   1. Type 2 diabetes mellitus without complication  - HgB Z6X is gone up to 10.3% - Glucose (CBG) Increase - metFORMIN (GLUCOPHAGE) 1000 MG tablet; Take 1 tablet (1,000 mg total) by mouth 2 (two) times daily with a meal.  Dispense: 180 tablet; Refill: 3  Aim for 30 minutes of exercise most days. Rethink what you drink. Water is great! Aim for 2-3 Carb Choices per meal (30-45 grams) +/- 1 either way  Aim for 0-15 Carbs per snack if hungry  Include protein in moderation with your meals and snacks  Consider reading food labels for Total Carbohydrate and Fat Grams of foods  Consider checking BG at alternate times per day  Continue taking medication as directed Be mindful about how much sugar you are adding to beverages and other foods. Fruit Punch - find one with no sugar  Measure and decrease portions of carbohydrate foods  Make your plate and don't go back for seconds  2. Anxiety disorder, unspecified anxiety disorder type  - clonazePAM (KLONOPIN) 1 MG tablet; Take 1 tablet (1 mg total) by mouth daily.  Dispense:  30 tablet; Refill: 0  3. Peripheral neuropathic pain  - gabapentin (NEURONTIN) 400 MG capsule; TAKE 1 CAPSULE (400 MG TOTAL) BY MOUTH 4 (FOUR) TIMES DAILY. If your foot pain is severe, you can take one extra tablet in the afternoon  Dispense: 180 capsule; Refill: 3 - acetaminophen-codeine (TYLENOL #3) 300-30 MG per tablet; Take 1 tablet by mouth every 8 (eight) hours as needed for moderate pain.  Dispense: 60 tablet; Refill: 0  4. Dyslipidemia  - pravastatin (PRAVACHOL) 20 MG tablet; Take 1 tablet (20 mg total) by mouth daily.  Dispense: 90 tablet; Refill: 3  To address this please limit saturated fat to no more than 7% of your calories, limit cholesterol to 200 mg/day, increase fiber and exercise as tolerated. If needed we may add another cholesterol lowering medication to your regimen.   5. Gastroesophageal reflux  disease without esophagitis  - ranitidine (ZANTAC) 150 MG capsule; Take 1 capsule (150 mg total) by mouth 2 (two) times daily.  Dispense: 180 capsule; Refill: 3  6. Insomnia due to anxiety and fear  - mirtazapine (REMERON) 30 MG tablet; Take 1 tablet (30 mg total) by mouth at bedtime.  Dispense: 30 tablet; Refill: 3  Patient have been counseled extensively about nutrition and exercise  Return in about 3 months (around 05/24/2015), or if symptoms worsen or fail to improve, for Hemoglobin A1C and Follow up, DM, Follow up HTN, Generalized Anxiety Disorder.  The patient was given clear instructions to go to ER or return to medical center if symptoms don't improve, worsen or new problems develop. The patient verbalized understanding. The patient was told to call to get lab results if they haven't heard anything in the next week.   This note has been created with Surveyor, quantity. Any transcriptional errors are unintentional.    Angelica Chessman, MD, MHA, [place], [place], [place] and [place],  [place]   02/21/2015, 12:32 PM

## 2015-02-21 NOTE — Progress Notes (Deleted)
Patient has open ulcers on the medial aspects of her feet bilaterally. Patient states she "just woke up" five days ago with them on her feet. Patient also has a closed blister on her medial great left toe. Patient denies SOB, chest pain, N/V/D and dizziness. Patient denies current peripheral edema, but states she had peripheral edema before the ulceration/blistering started. Patient denies pain in feet or legs.  On exam, patient's lung sounds are clear in all lobes bilaterally. Heart rr and S1/S2 noted. No murmur. Patient has +2 radial and pedal pulses bilaterally. No edema noted to lower extremities currently. Patient can wiggle digits. Patient's skin is warm and dry.         Electronically signed by Virginia Crews, RN at 01/05/2015 1:34 PM

## 2015-03-31 ENCOUNTER — Other Ambulatory Visit: Payer: Self-pay | Admitting: *Deleted

## 2015-03-31 DIAGNOSIS — F419 Anxiety disorder, unspecified: Secondary | ICD-10-CM

## 2015-03-31 NOTE — Telephone Encounter (Signed)
Patient had [facility] pharmacy fax refill request for her clonazepam 1 mg tablets.  Will attempt to get PCP approval for refill.

## 2015-04-10 MED ORDER — CLONAZEPAM 1 MG PO TABS: 1.0000 mg | ORAL_TABLET | Freq: Every day | ORAL | Status: DC

## 2015-05-19 ENCOUNTER — Other Ambulatory Visit: Payer: Self-pay | Admitting: Internal Medicine

## 2015-06-14 ENCOUNTER — Other Ambulatory Visit: Payer: Self-pay | Admitting: Internal Medicine

## 2015-06-15 ENCOUNTER — Other Ambulatory Visit: Payer: Self-pay

## 2015-06-15 DIAGNOSIS — F409 Phobic anxiety disorder, unspecified: Secondary | ICD-10-CM

## 2015-06-15 DIAGNOSIS — F5105 Insomnia due to other mental disorder: Principal | ICD-10-CM

## 2015-06-15 MED ORDER — MIRTAZAPINE 30 MG PO TABS: 30.0000 mg | ORAL_TABLET | Freq: Every day | ORAL | Status: DC

## 2015-06-21 ENCOUNTER — Telehealth: Payer: Self-pay | Admitting: Internal Medicine

## 2015-06-21 NOTE — Telephone Encounter (Signed)
Patient called to request a med refill for her insulin, she stated that she has been out for a week. Please f/u with pt.

## 2015-07-13 ENCOUNTER — Ambulatory Visit: Payer: Commercial Managed Care - HMO | Admitting: Internal Medicine

## 2015-07-20 ENCOUNTER — Ambulatory Visit: Payer: Medicare Other | Attending: Internal Medicine | Admitting: Internal Medicine

## 2015-07-20 ENCOUNTER — Encounter: Payer: Self-pay | Admitting: Internal Medicine

## 2015-07-20 ENCOUNTER — Other Ambulatory Visit: Payer: Self-pay | Admitting: Pharmacist

## 2015-07-20 VITALS — BP 113/74 | HR 93 | Temp 97.9°F | Resp 18 | Wt 230.8 lb

## 2015-07-20 DIAGNOSIS — F409 Phobic anxiety disorder, unspecified: Secondary | ICD-10-CM | POA: Insufficient documentation

## 2015-07-20 DIAGNOSIS — E118 Type 2 diabetes mellitus with unspecified complications: Secondary | ICD-10-CM | POA: Diagnosis not present

## 2015-07-20 DIAGNOSIS — E1140 Type 2 diabetes mellitus with diabetic neuropathy, unspecified: Secondary | ICD-10-CM | POA: Diagnosis not present

## 2015-07-20 DIAGNOSIS — F5105 Insomnia due to other mental disorder: Secondary | ICD-10-CM

## 2015-07-20 DIAGNOSIS — F419 Anxiety disorder, unspecified: Secondary | ICD-10-CM

## 2015-07-20 DIAGNOSIS — I10 Essential (primary) hypertension: Secondary | ICD-10-CM

## 2015-07-20 LAB — GLUCOSE, POCT (MANUAL RESULT ENTRY): POC Glucose: 242 mg/dl — AB (ref 70–99)

## 2015-07-20 LAB — POCT GLYCOSYLATED HEMOGLOBIN (HGB A1C): Hemoglobin A1C: 10.3

## 2015-07-20 MED ORDER — CLONAZEPAM 1 MG PO TABS: 1.0000 mg | ORAL_TABLET | Freq: Every day | ORAL | Status: DC

## 2015-07-20 MED ORDER — INSULIN GLARGINE 100 UNIT/ML ~~LOC~~ SOLN: 25.0000 [IU] | Freq: Every day | SUBCUTANEOUS | Status: DC

## 2015-07-20 MED ORDER — TRAZODONE HCL 50 MG PO TABS: 50.0000 mg | ORAL_TABLET | Freq: Every day | ORAL | Status: DC

## 2015-07-20 MED ORDER — DICLOFENAC SODIUM 1 % TD GEL: 2.0000 g | Freq: Four times a day (QID) | TRANSDERMAL | Status: DC

## 2015-07-20 MED ORDER — ALBUTEROL SULFATE HFA 108 (90 BASE) MCG/ACT IN AERS: 2.0000 | INHALATION_SPRAY | Freq: Four times a day (QID) | RESPIRATORY_TRACT | Status: AC | PRN

## 2015-07-20 NOTE — Progress Notes (Signed)
Follow up on dm and htn States she has bilateral leg and feet numbness and pain States she is not sleeping well at night  States her nerves is shocked Wants to change meds or up dose on meds

## 2015-07-20 NOTE — Progress Notes (Signed)
Patient ID: Dicie Edelen, female   DOB: November 13, 1946, 70 y.o.   MRN: [id number]   Brandace Cargle, is a 69 y.o. female  [id number]:[id number]  [id number]  DOB - 1946-05-24  Chief Complaint  Patient presents with  . Diabetes  . Numbness        Subjective:   Annete Ayuso is a 69 y.o. female here today for a follow up visit. Patient has history of diabetes and hypertension with diabetic neuropathy and chronic pain syndrome. She is here today with major complaint of bilateral leg and feet numbness and pain, not new symptoms but patient claims is not getting better. She feels as if her nerves are shocked. She is currently on gabapentin 400 mg tablet by mouth every 6 hours. She requests a change in medication or increasing the dosage of her anxiety medication because she is not sleeping well and she constantly feels anxious. She has been on anxiety medication for over 20 years. She has been referred to the psychiatrist/social worker in the past. She claims her diabetes is under control, she did not bring her blood sugar log to the clinic today. She claims compliant with medications, she reports no side effects. Patient has No headache, No chest pain, No abdominal pain - No Nausea, No new weakness tingling or numbness, No Cough - SOB.  Problem  Diabetic Neuropathy, Painful  Insomnia Due to Anxiety and Fear    ALLERGIES: No Known Allergies  PAST MEDICAL HISTORY: Past Medical History  Diagnosis Date  . Cervicalgia   . Lumbago   . Pain in joint, hand   . Disturbance of skin sensation   . Polyneuropathy in diabetes(357.2)   . Olecranon bursitis   . Chronic pain syndrome   . Diabetes mellitus   . Hypertension   . Hyperlipidemia   . CHF (congestive heart failure)   . Asthma   . COPD (chronic obstructive pulmonary disease)   . Blood transfusion     MEDICATIONS AT HOME: Prior to Admission medications   Medication Sig Start Date End Date Taking? Authorizing Provider  acetaminophen-codeine  (TYLENOL #3) 300-30 MG per tablet Take 1 tablet by mouth every 8 (eight) hours as needed for moderate pain. 02/21/15  Yes Tresa Garter, MD  albuterol (PROVENTIL HFA;VENTOLIN HFA) 108 (90 BASE) MCG/ACT inhaler Inhale 2 puffs into the lungs every 6 (six) hours as needed for wheezing. 07/20/15 11/19/16 Yes Cynia Abruzzo Essie Christine, MD  clonazePAM (KLONOPIN) 1 MG tablet Take 1 tablet (1 mg total) by mouth daily. 07/20/15  Yes Tresa Garter, MD  diclofenac sodium (VOLTAREN) 1 % GEL Apply 2 g topically 4 (four) times daily. 07/20/15  Yes Jazzmine Kleiman Essie Christine, MD  gabapentin (NEURONTIN) 400 MG capsule TAKE 1 CAPSULE (400 MG TOTAL) BY MOUTH 4 (FOUR) TIMES DAILY. If your foot pain is severe, you can take one extra tablet in the afternoon 02/21/15  Yes Noeh Sparacino E Doreene Burke, MD  glucose monitoring kit (FREESTYLE) monitoring kit 1 each by Does not apply route as needed for other. Test blood sugar 3 times daily 06/23/14  Yes Jhayla Podgorski E Doreene Burke, MD  insulin glargine (LANTUS) 100 UNIT/ML injection Inject 0.25 mLs (25 Units total) into the skin at bedtime. 07/20/15  Yes Tresa Garter, MD  meloxicam (MOBIC) 15 MG tablet Take 1 tablet (15 mg total) by mouth daily. 10/03/14  Yes Tresa Garter, MD  metFORMIN (GLUCOPHAGE) 1000 MG tablet Take 1 tablet (1,000 mg total) by mouth 2 (two) times daily with a meal. 02/21/15  Yes Tresa Garter, MD  mirtazapine (REMERON) 30 MG tablet Take 1 tablet (30 mg total) by mouth at bedtime. 06/15/15  Yes Tresa Garter, MD  oxyCODONE-acetaminophen (PERCOCET) 10-325 MG per tablet Take 1 tablet by mouth 4 (four) times daily. 08/10/13  Yes Santiago Glad Prueter, PA-C  pravastatin (PRAVACHOL) 20 MG tablet Take 1 tablet (20 mg total) by mouth daily. 02/21/15  Yes Tresa Garter, MD  ranitidine (ZANTAC) 150 MG capsule Take 1 capsule (150 mg total) by mouth 2 (two) times daily. 02/21/15  Yes Tresa Garter, MD  bacitracin ointment Apply 1 application topically 2 (two) times  daily. Patient not taking: Reported on 07/20/2015 01/05/15   Montine Circle, PA-C  traZODone (DESYREL) 50 MG tablet Take 1 tablet (50 mg total) by mouth at bedtime. 07/20/15   Tresa Garter, MD     Objective:   Filed Vitals:   07/20/15 1128  BP: 113/74  Pulse: 93  Temp: 97.9 F (36.6 C)  TempSrc: Oral  Resp: 18  Weight: 230 lb 12.8 oz (104.69 kg)  SpO2: 98%    Exam General appearance : Awake, alert, not in any distress. Speech Clear. Not toxic looking HEENT: Atraumatic and Normocephalic, pupils equally reactive to light and accomodation Neck: supple, no JVD. No cervical lymphadenopathy.  Chest:Good air entry bilaterally, no added sounds  CVS: S1 S2 regular, no murmurs.  Abdomen: Bowel sounds present, Non tender and not distended with no gaurding, rigidity or rebound. Extremities: B/L Lower Ext shows no edema, both legs are warm to touch Neurology: Awake alert, and oriented X 3, CN II-XII intact, Non focal Skin:No Rash  Data Review Lab Results  Component Value Date   HGBA1C 10.30 07/20/2015   HGBA1C 10.30 02/21/2015   HGBA1C 9.7 10/03/2014     Assessment & Plan   1. Type 2 diabetes with complication  - Glucose (CBG) - POCT A1C - insulin glargine (LANTUS) 100 UNIT/ML injection; Inject 0.25 mLs (25 Units total) into the skin at bedtime.  Dispense: 30 mL; Refill: 3  Aim for 30 minutes of exercise most days. Rethink what you drink. Water is great! Aim for 2-3 Carb Choices per meal (30-45 grams) +/- 1 either way  Aim for 0-15 Carbs per snack if hungry  Include protein in moderation with your meals and snacks  Consider reading food labels for Total Carbohydrate and Fat Grams of foods  Consider checking BG at alternate times per day  Continue taking medication as directed Be mindful about how much sugar you are adding to beverages and other foods. Fruit Punch - find one with no sugar  Measure and decrease portions of carbohydrate foods  Make your plate and  don't go back for seconds   2. Anxiety disorder, unspecified anxiety disorder type  - clonazePAM (KLONOPIN) 1 MG tablet; Take 1 tablet (1 mg total) by mouth daily.  Dispense: 60 tablet; Refill: 0 - albuterol (PROVENTIL HFA;VENTOLIN HFA) 108 (90 BASE) MCG/ACT inhaler; Inhale 2 puffs into the lungs every 6 (six) hours as needed for wheezing.  Dispense: 1 Inhaler; Refill: 3 - traZODone (DESYREL) 50 MG tablet; Take 1 tablet (50 mg total) by mouth at bedtime.  Dispense: 30 tablet; Refill: 3  3. Diabetic neuropathy, painful  - diclofenac sodium (VOLTAREN) 1 % GEL; Apply 2 g topically 4 (four) times daily.  Dispense: 2 Tube; Refill: 2  - Ambulatory referral to Podiatry  4. Insomnia due to anxiety and fear  Start - traZODone (DESYREL) 50 MG tablet; Take  1 tablet (50 mg total) by mouth at bedtime.  Dispense: 30 tablet; Refill: 3  5. Essential hypertension  We have discussed target BP range and blood pressure goal. I have advised patient to check BP regularly and to call us back or report to clinic if the numbers are consistently higher than 140/90. We discussed the importance of compliance with medical therapy and DASH diet recommended, consequences of uncontrolled hypertension discussed.  - continue current BP medications Patient have been counseled extensively about nutrition and exercise  Return in about 4 weeks (around 08/17/2015), or if symptoms worsen or fail to improve, for CBG, Lab/Nurse Visit.  The patient was given clear instructions to go to ER or return to medical center if symptoms don't improve, worsen or new problems develop. The patient verbalized understanding. The patient was told to call to get lab results if they haven't heard anything in the next week.   This note has been created with Surveyor, quantity. Any transcriptional errors are unintentional.    Angelica Chessman, MD, [place], [place], [place], [place] and  [place], [place]   07/20/2015, 12:11 PM

## 2015-07-20 NOTE — Patient Instructions (Signed)
Peripheral Neuropathy Peripheral neuropathy is a type of nerve damage. It affects nerves that carry signals between the spinal cord and other parts of the body. These are called peripheral nerves. With peripheral neuropathy, one nerve or a group of nerves may be damaged.  CAUSES  Many things can damage peripheral nerves. For some people with peripheral neuropathy, the cause is unknown. Some causes include:  Diabetes. This is the most common cause of peripheral neuropathy.  Injury to a nerve.  Pressure or stress on a nerve that lasts a long time.  Too little vitamin B. Alcoholism can lead to this.  Infections.  Autoimmune diseases, such as multiple sclerosis and systemic lupus erythematosus.  Inherited nerve diseases.  Some medicines, such as cancer drugs.  Toxic substances, such as lead and mercury.  Too little blood flowing to the legs.  Kidney disease.  Thyroid disease. SIGNS AND SYMPTOMS  Different people have different symptoms. The symptoms you have will depend on which of your nerves is damaged. Common symptoms include:  Loss of feeling (numbness) in the feet and hands.  Tingling in the feet and hands.  Pain that burns.  Very sensitive skin.  Weakness.  Not being able to move a part of the body (paralysis).  Muscle twitching.  Clumsiness or poor coordination.  Loss of balance.  Not being able to control your bladder.  Feeling dizzy.  Sexual problems. DIAGNOSIS  Peripheral neuropathy is a symptom, not a disease. Finding the cause of peripheral neuropathy can be hard. To figure that out, your health care provider will take a medical history and do a physical exam. A neurological exam will also be done. This involves checking things affected by your brain, spinal cord, and nerves (nervous system). For example, your health care provider will check your reflexes, how you move, and what you can feel.  Other types of tests may also be ordered, such as:  Blood  tests.  A test of the fluid in your spinal cord.  Imaging tests, such as CT scans or an MRI.  Electromyography (EMG). This test checks the nerves that control muscles.  Nerve conduction velocity tests. These tests check how fast messages pass through your nerves.  Nerve biopsy. A small piece of nerve is removed. It is then checked under a microscope. TREATMENT   Medicine is often used to treat peripheral neuropathy. Medicines may include:  Pain-relieving medicines. Prescription or over-the-counter medicine may be suggested.  Antiseizure medicine. This may be used for pain.  Antidepressants. These also may help ease pain from neuropathy.  Lidocaine. This is a numbing medicine. You might wear a patch or be given a shot.  Mexiletine. This medicine is typically used to help control irregular heart rhythms.  Surgery. Surgery may be needed to relieve pressure on a nerve or to destroy a nerve that is causing pain.  Physical therapy to help movement.  Assistive devices to help movement. HOME CARE INSTRUCTIONS   Only take over-the-counter or prescription medicines as directed by your health care provider. Follow the instructions carefully for any given medicines. Do not take any other medicines without first getting approval from your health care provider.  If you have diabetes, work closely with your health care provider to keep your blood sugar under control.  If you have numbness in your feet:  Check every day for signs of injury or infection. Watch for redness, warmth, and swelling.  Wear padded socks and comfortable shoes. These help protect your feet.  Do not do   things that put pressure on your damaged nerve.  Do not smoke. Smoking keeps blood from getting to damaged nerves.  Avoid or limit alcohol. Too much alcohol can cause a lack of B vitamins. These vitamins are needed for healthy nerves.  Develop a good support system. Coping with peripheral neuropathy can be  stressful. Talk to a mental health specialist or join a support group if you are struggling.  Follow up with your health care provider as directed. SEEK MEDICAL CARE IF:   You have new signs or symptoms of peripheral neuropathy.  You are struggling emotionally from dealing with peripheral neuropathy.  You have a fever. SEEK IMMEDIATE MEDICAL CARE IF:   You have an injury or infection that is not healing.  You feel very dizzy or begin vomiting.  You have chest pain.  You have trouble breathing. Document Released: 11/01/2002 Document Revised: 07/24/2011 Document Reviewed: 07/19/2013 [facility] Patient Information 2015 [place], [place]. This information is not intended to replace advice given to you by your health care provider. Make sure you discuss any questions you have with your health care provider. Diabetes and Exercise Exercising regularly is important. It is not just about losing weight. It has many health benefits, such as:  Improving your overall fitness, flexibility, and endurance.  Increasing your bone density.  Helping with weight control.  Decreasing your body fat.  Increasing your muscle strength.  Reducing stress and tension.  Improving your overall health. People with diabetes who exercise gain additional benefits because exercise:  Reduces appetite.  Improves the body's use of blood sugar (glucose).  Helps lower or control blood glucose.  Decreases blood pressure.  Helps control blood lipids (such as cholesterol and triglycerides).  Improves the body's use of the hormone insulin by:  Increasing the body's insulin sensitivity.  Reducing the body's insulin needs.  Decreases the risk for heart disease because exercising:  Lowers cholesterol and triglycerides levels.  Increases the levels of good cholesterol (such as high-density lipoproteins [HDL]) in the body.  Lowers blood glucose levels. YOUR ACTIVITY PLAN  Choose an activity that you enjoy  and set realistic goals. Your health care provider or diabetes educator can help you make an activity plan that works for you. Exercise regularly as directed by your health care provider. This includes:  Performing resistance training twice a week such as push-ups, sit-ups, lifting weights, or using resistance bands.  Performing 150 minutes of cardio exercises each week such as walking, running, or playing sports.  Staying active and spending no more than 90 minutes at one time being inactive. Even short bursts of exercise are good for you. Three 10-minute sessions spread throughout the day are just as beneficial as a single 30-minute session. Some exercise ideas include:  Taking the dog for a walk.  Taking the stairs instead of the elevator.  Dancing to your favorite song.  Doing an exercise video.  Doing your favorite exercise with a friend. RECOMMENDATIONS FOR EXERCISING WITH TYPE 1 OR TYPE 2 DIABETES   Check your blood glucose before exercising. If blood glucose levels are greater than 240 mg/dL, check for urine ketones. Do not exercise if ketones are present.  Avoid injecting insulin into areas of the body that are going to be exercised. For example, avoid injecting insulin into:  The arms when playing tennis.  The legs when jogging.  Keep a record of:  Food intake before and after you exercise.  Expected peak times of insulin action.  Blood glucose levels before and  after you exercise.  The type and amount of exercise you have done.  Review your records with your health care provider. Your health care provider will help you to develop guidelines for adjusting food intake and insulin amounts before and after exercising.  If you take insulin or oral hypoglycemic agents, watch for signs and symptoms of hypoglycemia. They include:  Dizziness.  Shaking.  Sweating.  Chills.  Confusion.  Drink plenty of water while you exercise to prevent dehydration or heat stroke.  Body water is lost during exercise and must be replaced.  Talk to your health care provider before starting an exercise program to make sure it is safe for you. Remember, almost any type of activity is better than none. Document Released: 02/01/2004 Document Revised: 03/28/2014 Document Reviewed: 04/20/2013 [facility] Patient Information 2015 [place], [place]. This information is not intended to replace advice given to you by your health care provider. Make sure you discuss any questions you have with your health care provider. Basic Carbohydrate Counting for Diabetes Mellitus Carbohydrate counting is a method for keeping track of the amount of carbohydrates you eat. Eating carbohydrates naturally increases the level of sugar (glucose) in your blood, so it is important for you to know the amount that is okay for you to have in every meal. Carbohydrate counting helps keep the level of glucose in your blood within normal limits. The amount of carbohydrates allowed is different for every person. A dietitian can help you calculate the amount that is right for you. Once you know the amount of carbohydrates you can have, you can count the carbohydrates in the foods you want to eat. Carbohydrates are found in the following foods:  Grains, such as breads and cereals.  Dried beans and soy products.  Starchy vegetables, such as potatoes, peas, and corn.  Fruit and fruit juices.  Milk and yogurt.  Sweets and snack foods, such as cake, cookies, candy, chips, soft drinks, and fruit drinks. CARBOHYDRATE COUNTING There are two ways to count the carbohydrates in your food. You can use either of the methods or a combination of both. Reading the "Nutrition Facts" on [place] The "Nutrition Facts" is an area that is included on the labels of almost all packaged food and beverages in the [place]. It includes the serving size of that food or beverage and information about the nutrients in each serving of  the food, including the grams (g) of carbohydrate per serving.  Decide the number of servings of this food or beverage that you will be able to eat or drink. Multiply that number of servings by the number of grams of carbohydrate that is listed on the label for that serving. The total will be the amount of carbohydrates you will be having when you eat or drink this food or beverage. Learning Standard Serving Sizes of Food When you eat food that is not packaged or does not include "Nutrition Facts" on the label, you need to measure the servings in order to count the amount of carbohydrates.A serving of most carbohydrate-rich foods contains about 15 g of carbohydrates. The following list includes serving sizes of carbohydrate-rich foods that provide 15 g ofcarbohydrate per serving:   1 slice of bread (1 oz) or 1 six-inch tortilla.    of a hamburger bun or English muffin.  4-6 crackers.   cup unsweetened dry cereal.    cup hot cereal.   cup rice or pasta.    cup mashed potatoes or  of a  large baked potato.  1 cup fresh fruit or one small piece of fruit.    cup canned or frozen fruit or fruit juice.  1 cup milk.   cup plain fat-free yogurt or yogurt sweetened with artificial sweeteners.   cup cooked dried beans or starchy vegetable, such as peas, corn, or potatoes.  Decide the number of standard-size servings that you will eat. Multiply that number of servings by 15 (the grams of carbohydrates in that serving). For example, if you eat 2 cups of strawberries, you will have eaten 2 servings and 30 g of carbohydrates (2 servings x 15 g = 30 g). For foods such as soups and casseroles, in which more than one food is mixed in, you will need to count the carbohydrates in each food that is included. EXAMPLE OF CARBOHYDRATE COUNTING Sample Dinner  3 oz chicken breast.   cup of brown rice.   cup of corn.  1 cup milk.   1 cup strawberries with sugar-free whipped topping.   Carbohydrate Calculation Step 1: Identify the foods that contain carbohydrates:   Rice.   Corn.   Milk.   Strawberries. Step 2:Calculate the number of servings eaten of each:   2 servings of rice.   1 serving of corn.   1 serving of milk.   1 serving of strawberries. Step 3: Multiply each of those number of servings by 15 g:   2 servings of rice x 15 g = 30 g.   1 serving of corn x 15 g = 15 g.   1 serving of milk x 15 g = 15 g.   1 serving of strawberries x 15 g = 15 g. Step 4: Add together all of the amounts to find the total grams of carbohydrates eaten: 30 g + 15 g + 15 g + 15 g = 75 g. Document Released: 11/11/2005 Document Revised: 03/28/2014 Document Reviewed: 10/08/2013 [facility] Patient Information 2015 [place], [place]. This information is not intended to replace advice given to you by your health care provider. Make sure you discuss any questions you have with your health care provider.

## 2015-08-17 ENCOUNTER — Encounter: Payer: Commercial Managed Care - HMO | Admitting: Pharmacist

## 2015-09-04 DIAGNOSIS — Z23 Encounter for immunization: Secondary | ICD-10-CM | POA: Diagnosis not present

## 2015-09-04 DIAGNOSIS — E785 Hyperlipidemia, unspecified: Secondary | ICD-10-CM | POA: Diagnosis not present

## 2015-09-04 DIAGNOSIS — F4320 Adjustment disorder, unspecified: Secondary | ICD-10-CM | POA: Diagnosis not present

## 2015-09-04 DIAGNOSIS — K219 Gastro-esophageal reflux disease without esophagitis: Secondary | ICD-10-CM | POA: Diagnosis not present

## 2015-09-04 DIAGNOSIS — F5104 Psychophysiologic insomnia: Secondary | ICD-10-CM | POA: Diagnosis not present

## 2015-09-04 DIAGNOSIS — E1140 Type 2 diabetes mellitus with diabetic neuropathy, unspecified: Secondary | ICD-10-CM | POA: Diagnosis not present

## 2015-09-18 DIAGNOSIS — E1140 Type 2 diabetes mellitus with diabetic neuropathy, unspecified: Secondary | ICD-10-CM | POA: Diagnosis not present

## 2015-10-09 DIAGNOSIS — F5104 Psychophysiologic insomnia: Secondary | ICD-10-CM | POA: Diagnosis not present

## 2015-10-09 DIAGNOSIS — D72829 Elevated white blood cell count, unspecified: Secondary | ICD-10-CM | POA: Diagnosis not present

## 2015-10-09 DIAGNOSIS — F4320 Adjustment disorder, unspecified: Secondary | ICD-10-CM | POA: Diagnosis not present

## 2015-10-09 DIAGNOSIS — R300 Dysuria: Secondary | ICD-10-CM | POA: Diagnosis not present

## 2015-10-09 DIAGNOSIS — F324 Major depressive disorder, single episode, in partial remission: Secondary | ICD-10-CM | POA: Diagnosis not present

## 2016-09-04 ENCOUNTER — Other Ambulatory Visit: Payer: Self-pay | Admitting: Family Medicine

## 2016-09-04 DIAGNOSIS — Z1231 Encounter for screening mammogram for malignant neoplasm of breast: Secondary | ICD-10-CM

## 2016-09-05 ENCOUNTER — Other Ambulatory Visit: Payer: Self-pay | Admitting: Family Medicine

## 2016-09-05 DIAGNOSIS — R0989 Other specified symptoms and signs involving the circulatory and respiratory systems: Secondary | ICD-10-CM

## 2016-09-16 ENCOUNTER — Other Ambulatory Visit: Payer: Self-pay

## 2016-09-23 ENCOUNTER — Encounter: Payer: Self-pay | Admitting: Podiatry

## 2016-09-23 ENCOUNTER — Ambulatory Visit (INDEPENDENT_AMBULATORY_CARE_PROVIDER_SITE_OTHER): Payer: Medicare Other | Admitting: Podiatry

## 2016-09-23 VITALS — BP 126/70 | HR 69 | Resp 16 | Ht 62.0 in

## 2016-09-23 DIAGNOSIS — B351 Tinea unguium: Secondary | ICD-10-CM | POA: Diagnosis not present

## 2016-09-23 DIAGNOSIS — E0843 Diabetes mellitus due to underlying condition with diabetic autonomic (poly)neuropathy: Secondary | ICD-10-CM

## 2016-09-23 DIAGNOSIS — L608 Other nail disorders: Secondary | ICD-10-CM | POA: Diagnosis not present

## 2016-09-23 DIAGNOSIS — L603 Nail dystrophy: Secondary | ICD-10-CM

## 2016-09-23 NOTE — Progress Notes (Signed)
   Subjective:    Patient ID: Yvonne [name], female    DOB: 26-Aug-1946, 70 y.o.   MRN: [id number]  HPI    Review of Systems  Cardiovascular: Positive for leg swelling.  Psychiatric/Behavioral: The patient is nervous/anxious.   All other systems reviewed and are negative.      Objective:   Physical Exam        Assessment & Plan:

## 2016-09-29 NOTE — Progress Notes (Signed)
Patient ID: ROSCHELL HERMON, female   DOB: [birth year], 70 y.o.   MRN: [id number] SUBJECTIVE Patient with a history of diabetes mellitus presents to office today complaining of elongated, thickened nails. Pain while ambulating in shoes. Patient is unable to trim their own nails.   No Known Allergies  OBJECTIVE General Patient is awake, alert, and oriented x 3 and in no acute distress. Derm Skin is dry and supple bilateral. Negative open lesions or macerations. Remaining integument unremarkable. Nails are tender, long, thickened and dystrophic with subungual debris, consistent with onychomycosis, 1-5 bilateral. No signs of infection noted. Vasc  DP and PT pedal pulses palpable bilaterally. Temperature gradient within normal limits.  Neuro Epicritic and protective threshold sensation diminished bilaterally.  Musculoskeletal Exam No symptomatic pedal deformities noted bilateral. Muscular strength within normal limits.  ASSESSMENT 1. Diabetes Mellitus w/ peripheral neuropathy 2. Onychomycosis of nail due to dermatophyte bilateral 3. Pain in foot bilateral  PLAN OF CARE 1. Patient evaluated today. 2. Instructed to maintain good pedal hygiene and foot care. Stressed importance of controlling blood sugar.  3. Mechanical debridement of nails 1-5 bilaterally performed using a nail nipper. Filed with dremel without incident.  4. Return to clinic in 3 mos.    Edrick Kins, DPM

## 2016-09-30 ENCOUNTER — Ambulatory Visit
Admission: RE | Admit: 2016-09-30 | Discharge: 2016-09-30 | Disposition: A | Discharge status: Home / Self Care | Payer: Medicare Other | Source: Ambulatory Visit | Attending: Family Medicine | Admitting: Family Medicine

## 2016-09-30 DIAGNOSIS — R0989 Other specified symptoms and signs involving the circulatory and respiratory systems: Secondary | ICD-10-CM

## 2016-09-30 DIAGNOSIS — Z1231 Encounter for screening mammogram for malignant neoplasm of breast: Secondary | ICD-10-CM

## 2016-12-23 ENCOUNTER — Ambulatory Visit: Payer: Medicare Other | Admitting: Podiatry

## 2017-10-17 ENCOUNTER — Telehealth: Payer: Self-pay | Admitting: Hematology

## 2017-10-17 ENCOUNTER — Telehealth: Payer: Self-pay | Admitting: *Deleted

## 2017-10-17 NOTE — Telephone Encounter (Signed)
"  This is Yvonne [name] calling for this patient who only has transportation on Monday's so wee need appointment changed."  Scheduler choices are to keep appointment with this provider who is out for several Mondays or switch providers with first available December 10 th.  Patient "agrees to change providers.  I have not been there in over eleven years or more so it's okay.  My PCP wants me to come for a check up."

## 2017-10-17 NOTE — Telephone Encounter (Signed)
Patient dtr called to reschedule appointment. Per dtr patient can only come on Monday's due to transportation. Patient appointment rescheduled from 11/27 to 12/10 and moved from Dr. Lebron Conners to Dr. Irene Limbo. Patient dtr has new date/time and is aware provider has been changed due to no availability with Dr. Lebron Conners on Monday ([facility]).

## 2017-10-21 ENCOUNTER — Ambulatory Visit: Payer: Medicare Other | Admitting: Hematology and Oncology

## 2017-10-31 ENCOUNTER — Telehealth: Payer: Self-pay | Admitting: *Deleted

## 2017-10-31 NOTE — Telephone Encounter (Signed)
Sw pt regarding upcoming inclement weather.  Pt would like to reschedule apt.  Pt can only come Mondays.  Informed pt that scheduler would call to reschedule apt.  Pt verbalized understanding.  High priority message sent to scheduling

## 2017-11-03 ENCOUNTER — Ambulatory Visit: Payer: Medicare Other | Admitting: Hematology

## 2017-11-04 ENCOUNTER — Telehealth: Payer: Self-pay | Admitting: Hematology

## 2017-11-04 NOTE — Telephone Encounter (Signed)
Spoke with patient regarding appointment per scheduling message 12/7

## 2017-12-01 ENCOUNTER — Ambulatory Visit: Payer: Medicare Other | Admitting: Hematology

## 2017-12-02 ENCOUNTER — Telehealth: Payer: Self-pay | Admitting: Hematology

## 2017-12-02 NOTE — Telephone Encounter (Signed)
Spoke with patients daughter regarding appointment D/T/Loc..  She requested first Monday in February due to her work schedule.

## 2017-12-25 ENCOUNTER — Ambulatory Visit: Payer: Medicare Other | Admitting: Hematology

## 2017-12-26 NOTE — Progress Notes (Signed)
HEMATOLOGY ONCOLOGY CONSULT NOTE  Patient Care Team: Tresa Garter, MD as PCP - General (Internal Medicine)  CHIEF COMPLAINTS/PURPOSE OF CONSULTATION:  Lymphocytosis  HISTORY OF PRESENTING ILLNESS:   Yvonne [name] 72 y.o. female is here because of a referral from Dr. Dema Severin from [facility] regarding lymphocytosis.   She is accompanied today by her daughter in law. The pt reports that she is doing well overall. She reports not feeling any different over the last 2 years. She reports using an inhaler and taking prednisone. She has neuropathy and also has DM. Of note prior to today's visit, the pt had a severe flesh-eating infection in her groin several years ago. She reports some difficulty walking down steps with her cane and has not seen PT about her waning muscle strength in her legs.  Most recent lab results from 08/04/17 of CBC revealed a Lymph counts at 5.5k, and labs on 06/23/14 showed Lymph Abs at 4.4k.  On review of systems, pt reports stable weight, waning muscle strength (which she attributes to her inactivity) and denies fevers, chills, night sweats, abdominal pains, lymph node swelling, and any other symptoms.   On PMHx she has COPD and emphysema, and smokes 5 packs/week. She also has DM2. She reports that her DM is not well controlled and blood sugar stays consistently around 300.    MEDICAL HISTORY:  Past Medical History:  Diagnosis Date  . Asthma   . Blood transfusion   . Cervicalgia   . CHF (congestive heart failure) ([place])   . Chronic pain syndrome   . COPD (chronic obstructive pulmonary disease) ([place])   . Diabetes mellitus   . Disturbance of skin sensation   . Hyperlipidemia   . Hypertension   . Lumbago   . Olecranon bursitis   . Pain in joint, hand   . Polyneuropathy in diabetes(357.2)     SURGICAL HISTORY: Past Surgical History:  Procedure Laterality Date  . ABDOMINAL HYSTERECTOMY    . BACK SURGERY     from bacteria     SOCIAL HISTORY: Social History   Socioeconomic History  . Marital status: Widowed    Spouse name: Not on file  . Number of children: Not on file  . Years of education: Not on file  . Highest education level: Not on file  Social Needs  . Financial resource strain: Not on file  . Food insecurity - worry: Not on file  . Food insecurity - inability: Not on file  . Transportation needs - medical: Not on file  . Transportation needs - non-medical: Not on file  Occupational History  . Not on file  Tobacco Use  . Smoking status: Current Some Day Smoker    Packs/day: 1.00    Years: 30.00    Pack years: 30.00    Types: Cigarettes    Last attempt to quit: 02/24/2012    Years since quitting: 5.8  . Smokeless tobacco: Never Used  . Tobacco comment: was dx with COPD  Substance and Sexual Activity  . Alcohol use: No  . Drug use: No  . Sexual activity: Not on file  Other Topics Concern  . Not on file  Social History Narrative   Lives at home with her son. Still ambulatory without cane or walker    FAMILY HISTORY: No family history on file.  ALLERGIES:  has No Known Allergies.  MEDICATIONS:  Current Outpatient Medications  Medication Sig Dispense Refill  . acetaminophen-codeine (TYLENOL #3) 300-30  MG per tablet Take 1 tablet by mouth every 8 (eight) hours as needed for moderate pain. 60 tablet 0  . albuterol (PROVENTIL HFA;VENTOLIN HFA) 108 (90 BASE) MCG/ACT inhaler Inhale 2 puffs into the lungs every 6 (six) hours as needed for wheezing. 1 Inhaler 3  . ALPRAZolam (XANAX) 0.25 MG tablet Take 0.25 mg by mouth at bedtime as needed.     . BELSOMRA 20 MG TABS Take 20 mg by mouth daily.     Marland Kitchen gabapentin (NEURONTIN) 400 MG capsule TAKE 1 CAPSULE (400 MG TOTAL) BY MOUTH 4 (FOUR) TIMES DAILY. If your foot pain is severe, you can take one extra tablet in the afternoon 180 capsule 3  . glucose monitoring kit (FREESTYLE) monitoring kit 1 each by Does not apply route as needed for other.  Test blood sugar 3 times daily 1 each 1  . insulin glargine (LANTUS) 100 UNIT/ML injection Inject 0.25 mLs (25 Units total) into the skin at bedtime. 30 mL 3  . metFORMIN (GLUCOPHAGE) 1000 MG tablet Take 1 tablet (1,000 mg total) by mouth 2 (two) times daily with a meal. 180 tablet 3  . pravastatin (PRAVACHOL) 20 MG tablet Take 1 tablet (20 mg total) by mouth daily. 90 tablet 3  . ranitidine (ZANTAC) 150 MG capsule Take 1 capsule (150 mg total) by mouth 2 (two) times daily. 180 capsule 3   No current facility-administered medications for this visit.     REVIEW OF SYSTEMS:   Constitutional: Denies fevers, chills or abnormal night sweats Eyes: Denies blurriness of vision, double vision or watery eyes Ears, nose, mouth, throat, and face: Denies mucositis or sore throat Respiratory: Denies cough, dyspnea or wheezes Cardiovascular: Denies palpitation, chest discomfort or lower extremity swelling Gastrointestinal:  Denies nausea, heartburn or change in bowel habits Skin: Denies abnormal skin rashes Lymphatics: Denies new lymphadenopathy or easy bruising Neurological:Denies numbness, tingling or new weaknesses Behavioral/Psych: Mood is stable, no new changes  All other systems were reviewed with the patient and are negative.  PHYSICAL EXAMINATION:  Vitals:   12/29/17 1131  BP: (!) 134/91  Pulse: (!) 106  Resp: (!) 24  Temp: 99.1 F (37.3 C)  SpO2: 97%   Filed Weights   12/29/17 1131  Weight: 227 lb (103 kg)    GENERAL:alert, no distress and comfortable SKIN: skin color, texture, turgor are normal, no rashes or significant lesions EYES: normal, conjunctiva are pink and non-injected, sclera clear OROPHARYNX:no exudate, no erythema and lips, buccal mucosa, and tongue normal  NECK: supple, thyroid normal size, non-tender, without nodularity LYMPH:  no palpable lymphadenopathy in the cervical, axillary or inguinal LUNGS: clear to auscultation and percussion with normal breathing  effort, few scattered rhonci HEART: regular rate & rhythm and no murmurs and no lower extremity edema ABDOMEN:abdomen soft, non-tender and normal bowel sounds, no palpable hepatosplenomegaly. Musculoskeletal:no cyanosis of digits and no clubbing  PSYCH: alert & oriented x 3 with fluent speech NEURO: no focal motor/sensory deficits  LABORATORY DATA:  I have reviewed the data as listed Recent Results (from the past 2160 hour(s))  CMP ([place] only)     Status: Abnormal   Collection Time: 12/29/17 12:20 PM  Result Value Ref Range   Sodium 133 (L) 136 - 145 mmol/L   Potassium 4.6 3.5 - 5.1 mmol/L   Chloride 97 (L) 98 - 109 mmol/L   CO2 26 22 - 29 mmol/L   Glucose, Bld 431 (H) 70 - 140 mg/dL   BUN 13 7 - 26  mg/dL   Creatinine 1.35 (H) 0.60 - 1.10 mg/dL   Calcium 10.2 8.4 - 10.4 mg/dL   Total Protein 6.9 6.4 - 8.3 g/dL   Albumin 3.7 3.5 - 5.0 g/dL   AST 11 5 - 34 U/L   ALT 8 0 - 55 U/L   Alkaline Phosphatase 93 40 - 150 U/L   Total Bilirubin <0.2 (L) 0.2 - 1.2 mg/dL   GFR, Est Non Af Am 38 (L) >60 mL/min   GFR, Est AFR Am 44 (L) >60 mL/min    Comment: (NOTE) The eGFR has been calculated using the CKD EPI equation. This calculation has not been validated in all clinical situations. eGFR's persistently <60 mL/min signify possible Chronic Kidney Disease.    Anion gap 10 3 - 11    Comment: Performed at [facility], [address] [place], [place] [address]  Flow Cytometry     Status: None   Collection Time: 12/29/17 12:20 PM  Result Value Ref Range   Flow Cytometry See Scanned report in [place]: Performed at [facility], [place] [address], [place], [place] [address]  Lactate dehydrogenase     Status: Abnormal   Collection Time: 12/29/17 12:20 PM  Result Value Ref Range   LDH 118 (L) 125 - 245 U/L    Comment: Performed at [facility], [place] [address] [place], [place] [address]   Hepatitis C antibody     Status: None   Collection Time: 12/29/17 12:20 PM  Result Value Ref Range   HCV Ab <0.1 0.0 - 0.9 s/co ratio    Comment: (NOTE)                                  Negative:     < 0.8                             Indeterminate: 0.8 - 0.9                                  Positive:     > 0.9 The CDC recommends that a positive HCV antibody result be followed up with a HCV Nucleic Acid Amplification test (219758). Performed At: [facility] [place], [place] [id number] Rush Farmer MD EB:[id number] Performed at [facility], [place] [address] [place], [place] [address]    CBC -pending    RADIOGRAPHIC STUDIES: I have personally reviewed the radiological images as listed and agreed with the findings in the report. No results found.  ASSESSMENT & PLAN:   72 y.o. is a  female with   1.Lymphocytosis - noted incidentally on routine labs ?reactive lymphocytosis due to smoking vs clonal lymphocytosis. No overt constitutional symptoms noted below the patient has fatigue which is multifactorial. No overt fevers chills night sweats unexpected weight loss. No associated anemia or thrombocytopenia noted. LDH within normal limits PLAN -workup ordered as noted below -Discussed with the patient and her daughter available outside [place] today and diagnostic considerations. - her lymphocytes have been border-line elevated for the past couple years without much change. Absolute lymphocyte count 7 increased from 4.4k to 5.5k over the last 3 years. -Flow cytometry was done as a part of her  workup today and showed a 16% clonal B lymphocytes which are CD5, CD20 and CD23 positive. 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 Would need to rule out presence of lymphadenopathy or hepatosplenomegaly to definitively rule out CLL/SLL. -With either diagnosis would not have a strong indication  for urgent treatment at this time given significant burden of other comorbidities and otherwise asymptomatic low-grade lymphoproliferative process. -Counseled on smoking cessation since this is also causing some of her lymphocytosis. -We will set up patient for CT chest abdomen pelvis without contrast due to her chronic kidney disease with poorly controlled diabetes.  2. RBC microcytosis without anemia  Her RBC counts are microcytic, likely indicating alpha-thal or beta thal trait.  PLAN -Hemoglobin electrophoresis -Ferritin, iron profile -monitor . Orders Placed This Encounter  Procedures  . CBC & Diff and Retic    Standing Status:   Future    Number of Occurrences:   1    Standing Expiration Date:   12/29/2018  . CMP ([place] [facility] only)    Standing Status:   Future    Number of Occurrences:   1    Standing Expiration Date:   12/29/2018  . Flow Cytometry    Patient with persistent lymphocytosis r/o clonal lymphoproliferative disorder    Standing Status:   Future    Number of Occurrences:   1    Standing Expiration Date:   12/29/2018  . Lactate dehydrogenase    Standing Status:   Future    Number of Occurrences:   1    Standing Expiration Date:   12/29/2018  . Smear    Standing Status:   Future    Number of Occurrences:   1    Standing Expiration Date:   12/29/2018  . Hepatitis C antibody    Standing Status:   Future    Number of Occurrences:   1    Standing Expiration Date:   12/29/2018    3. Concerns for Self-Care at Home -Encouraged pt to follow up with her PCP regarding her waning muscle strength and seeing PT and in-home evaluation.   4. . Patient Active Problem List   Diagnosis Date Noted  . Diabetic neuropathy, painful ([place]) 07/20/2015  . Insomnia due to anxiety and fear 07/20/2015  . Anxiety disorder 10/03/2014  . Preventative health care 10/03/2014  . Type 2 diabetes mellitus without complication ([place]) 35/24/8185  . Depression 06/23/2014  . Essential hypertension  06/23/2014  . Peripheral neuropathic pain 06/23/2014  . Gastroesophageal reflux disease without esophagitis 06/23/2014  . Dyslipidemia 06/23/2014  . Chronic neck pain 03/09/2012  . Right arm pain 03/09/2012  . Chronic low back pain 03/09/2012  . Right knee pain 03/09/2012  . Joint stiffness of hand 03/09/2012  . Encephalopathy 02/05/2012  . Hypoxia 02/01/2012  . Fever 02/01/2012  . Leukocytosis 02/01/2012  . Bronchitis 02/01/2012  . Confusion 02/01/2012  . Diarrhea 02/01/2012  . COPD (chronic obstructive pulmonary disease) ([place]) 02/01/2012  . CAP (community acquired pneumonia) 02/01/2012  . Diabetes mellitus ([place])    -Uncontrolled diabetes. -Patient was recommended to maintain close f/u with PCP for management of other medical co-morbidities.  Labs today CT chest/abd/pelvis without contrast RTC with Dr Irene Limbo in 2 weeks   All questions were answered. The patient knows to call the clinic with any problems, questions or concerns. I spent 45 minutes counseling the patient face to face. The total time spent in the appointment was 60 minutes and more than 50%  was on counseling.  This document serves as a record of services personally performed by Sullivan Lone, MD. It was created on his behalf by [place], a trained medical scribe. The creation of this record is based on the scribe's personal observations and the provider's statements to them.   .I have reviewed the above documentation for accuracy and completeness, and I agree with the above. Brunetta Genera MD MS

## 2017-12-29 ENCOUNTER — Encounter: Payer: Self-pay | Admitting: Hematology

## 2017-12-29 ENCOUNTER — Inpatient Hospital Stay: Payer: Medicare Other | Attending: Hematology | Admitting: Hematology

## 2017-12-29 ENCOUNTER — Inpatient Hospital Stay: Payer: Medicare Other

## 2017-12-29 ENCOUNTER — Telehealth: Payer: Self-pay | Admitting: Hematology

## 2017-12-29 VITALS — BP 134/91 | HR 106 | Temp 99.1°F | Resp 24 | Ht 62.0 in | Wt 227.0 lb

## 2017-12-29 DIAGNOSIS — Z79899 Other long term (current) drug therapy: Secondary | ICD-10-CM

## 2017-12-29 DIAGNOSIS — E1140 Type 2 diabetes mellitus with diabetic neuropathy, unspecified: Secondary | ICD-10-CM | POA: Diagnosis not present

## 2017-12-29 DIAGNOSIS — E1165 Type 2 diabetes mellitus with hyperglycemia: Secondary | ICD-10-CM

## 2017-12-29 DIAGNOSIS — E1122 Type 2 diabetes mellitus with diabetic chronic kidney disease: Secondary | ICD-10-CM | POA: Diagnosis not present

## 2017-12-29 DIAGNOSIS — D72820 Lymphocytosis (symptomatic): Secondary | ICD-10-CM | POA: Diagnosis not present

## 2017-12-29 DIAGNOSIS — F17210 Nicotine dependence, cigarettes, uncomplicated: Secondary | ICD-10-CM

## 2017-12-29 DIAGNOSIS — C8300 Small cell B-cell lymphoma, unspecified site: Secondary | ICD-10-CM

## 2017-12-29 DIAGNOSIS — Z794 Long term (current) use of insulin: Secondary | ICD-10-CM

## 2017-12-29 DIAGNOSIS — J449 Chronic obstructive pulmonary disease, unspecified: Secondary | ICD-10-CM | POA: Diagnosis not present

## 2017-12-29 DIAGNOSIS — D479 Neoplasm of uncertain behavior of lymphoid, hematopoietic and related tissue, unspecified: Secondary | ICD-10-CM

## 2017-12-29 DIAGNOSIS — R718 Other abnormality of red blood cells: Secondary | ICD-10-CM

## 2017-12-29 LAB — LACTATE DEHYDROGENASE: LDH: 118 U/L — ABNORMAL LOW (ref 125–245)

## 2017-12-29 LAB — CMP (CANCER CENTER ONLY)
ALT: 8 U/L (ref 0–55)
AST: 11 U/L (ref 5–34)
Albumin: 3.7 g/dL (ref 3.5–5.0)
Alkaline Phosphatase: 93 U/L (ref 40–150)
Anion gap: 10 (ref 3–11)
BUN: 13 mg/dL (ref 7–26)
CO2: 26 mmol/L (ref 22–29)
Calcium: 10.2 mg/dL (ref 8.4–10.4)
Chloride: 97 mmol/L — ABNORMAL LOW (ref 98–109)
Creatinine: 1.35 mg/dL — ABNORMAL HIGH (ref 0.60–1.10)
GFR, Est AFR Am: 44 mL/min — ABNORMAL LOW (ref >60)
GFR, Estimated: 38 mL/min — ABNORMAL LOW (ref >60)
Glucose, Bld: 431 mg/dL — ABNORMAL HIGH (ref 70–140)
Potassium: 4.6 mmol/L (ref 3.5–5.1)
Sodium: 133 mmol/L — ABNORMAL LOW (ref 136–145)
Total Bilirubin: <0.2 mg/dL — ABNORMAL LOW (ref 0.2–1.2)
Total Protein: 6.9 g/dL (ref 6.4–8.3)

## 2017-12-29 NOTE — Telephone Encounter (Signed)
Scheduled appt per 2/4 los - Gave patient AVS and calender per los.  

## 2017-12-30 LAB — HEPATITIS C ANTIBODY: HCV Ab: <0.1 s/co ratio (ref 0.0–0.9)

## 2018-01-02 LAB — FLOW CYTOMETRY

## 2018-01-07 ENCOUNTER — Telehealth: Payer: Self-pay | Admitting: *Deleted

## 2018-01-07 NOTE — Telephone Encounter (Signed)
Spoke with patient regarding CT scan for Friday, 2/15 at 12:30 pm. Patient stated,"they picked up the contrast today, and I know when to drink it. Instructed her to be NPO four hours before procedure. She has transportation for scan." Instructed patient that if she has any questions before Friday to call the office. She verbalized understanding.

## 2018-01-09 ENCOUNTER — Ambulatory Visit (HOSPITAL_COMMUNITY)
Admission: RE | Admit: 2018-01-09 | Discharge: 2018-01-09 | Disposition: A | Discharge status: Home / Self Care | Payer: Medicare Other | Source: Ambulatory Visit | Attending: Hematology | Admitting: Hematology

## 2018-01-09 DIAGNOSIS — D72820 Lymphocytosis (symptomatic): Secondary | ICD-10-CM | POA: Insufficient documentation

## 2018-01-09 DIAGNOSIS — I2510 Atherosclerotic heart disease of native coronary artery without angina pectoris: Secondary | ICD-10-CM | POA: Insufficient documentation

## 2018-01-09 DIAGNOSIS — D479 Neoplasm of uncertain behavior of lymphoid, hematopoietic and related tissue, unspecified: Secondary | ICD-10-CM | POA: Diagnosis present

## 2018-01-09 DIAGNOSIS — K8020 Calculus of gallbladder without cholecystitis without obstruction: Secondary | ICD-10-CM | POA: Insufficient documentation

## 2018-01-09 DIAGNOSIS — C8300 Small cell B-cell lymphoma, unspecified site: Secondary | ICD-10-CM | POA: Diagnosis present

## 2018-01-12 ENCOUNTER — Telehealth: Payer: Self-pay | Admitting: *Deleted

## 2018-01-12 ENCOUNTER — Telehealth: Payer: Self-pay | Admitting: Hematology

## 2018-01-12 ENCOUNTER — Ambulatory Visit: Payer: Medicare Other | Admitting: Hematology

## 2018-01-12 NOTE — Telephone Encounter (Signed)
Relative of pt called stating pt unable to make office visit today due to transportation issues.  Unsure when patient could reschedule also due to transportation.  Caller requesting Dr. Irene Limbo call pt's daughter Yvonne [name] [phone]) with results/plan.  Per caller, Timmothy Euler it pt's POA, but is not listed under contact information.  Message sent to Dr. Irene Limbo to find out how to proceed.

## 2018-01-12 NOTE — Telephone Encounter (Signed)
Called left vm ..

## 2018-01-13 ENCOUNTER — Telehealth: Payer: Self-pay

## 2018-01-13 NOTE — Telephone Encounter (Signed)
Spoke with patient concerning scheduled appointment, and she acceted this date and time. Per 2/19 inbasket

## 2018-03-03 ENCOUNTER — Ambulatory Visit: Payer: Medicare Other | Admitting: Hematology

## 2018-03-03 ENCOUNTER — Telehealth: Payer: Self-pay

## 2018-03-03 ENCOUNTER — Other Ambulatory Visit: Payer: Medicare Other

## 2018-03-03 NOTE — Telephone Encounter (Signed)
Called pt to inform her that she was missed at her lab and doctor visit appt today at [facility]. No answer on pt phone. Marked as no show today.

## 2018-04-01 ENCOUNTER — Inpatient Hospital Stay: Payer: Medicare Other

## 2018-04-01 ENCOUNTER — Inpatient Hospital Stay: Payer: Medicare Other | Attending: Hematology | Admitting: Hematology

## 2018-04-27 ENCOUNTER — Emergency Department (HOSPITAL_COMMUNITY)
Admission: EM | Admit: 2018-04-27 | Discharge: 2018-04-27 | Disposition: A | Discharge status: Home / Self Care | Payer: Medicare Other | Attending: Emergency Medicine | Admitting: Emergency Medicine

## 2018-04-27 ENCOUNTER — Encounter (HOSPITAL_COMMUNITY): Payer: Self-pay | Admitting: *Deleted

## 2018-04-27 DIAGNOSIS — M79675 Pain in left toe(s): Secondary | ICD-10-CM | POA: Diagnosis not present

## 2018-04-27 DIAGNOSIS — Z5321 Procedure and treatment not carried out due to patient leaving prior to being seen by health care provider: Secondary | ICD-10-CM | POA: Diagnosis not present

## 2018-04-27 LAB — CBC WITH DIFFERENTIAL/PLATELET
Abs Immature Granulocytes: 0 10*3/uL (ref 0.0–0.1)
Basophils Absolute: 0.1 10*3/uL (ref 0.0–0.1)
Basophils Relative: 1 %
Eosinophils Absolute: 0.2 10*3/uL (ref 0.0–0.7)
Eosinophils Relative: 2 %
HCT: 43.8 % (ref 36.0–46.0)
Hemoglobin: 13.7 g/dL (ref 12.0–15.0)
Immature Granulocytes: 0 %
Lymphocytes Relative: 36 %
Lymphs Abs: 4.2 10*3/uL — ABNORMAL HIGH (ref 0.7–4.0)
MCH: 22.8 pg — ABNORMAL LOW (ref 26.0–34.0)
MCHC: 31.3 g/dL (ref 30.0–36.0)
MCV: 72.9 fL — ABNORMAL LOW (ref 78.0–100.0)
Monocytes Absolute: 0.6 10*3/uL (ref 0.1–1.0)
Monocytes Relative: 6 %
Neutro Abs: 6.3 10*3/uL (ref 1.7–7.7)
Neutrophils Relative %: 55 %
Platelets: 361 10*3/uL (ref 150–400)
RBC: 6.01 MIL/uL — ABNORMAL HIGH (ref 3.87–5.11)
RDW: 14.5 % (ref 11.5–15.5)
WBC: 11.4 10*3/uL — ABNORMAL HIGH (ref 4.0–10.5)

## 2018-04-27 LAB — BASIC METABOLIC PANEL
Anion gap: 11 (ref 5–15)
BUN: 13 mg/dL (ref 6–20)
CO2: 25 mmol/L (ref 22–32)
Calcium: 10.4 mg/dL — ABNORMAL HIGH (ref 8.9–10.3)
Chloride: 98 mmol/L — ABNORMAL LOW (ref 101–111)
Creatinine, Ser: 0.95 mg/dL (ref 0.44–1.00)
GFR calc Af Amer: >60 mL/min (ref >60)
GFR calc non Af Amer: 58 mL/min — ABNORMAL LOW (ref >60)
Glucose, Bld: 267 mg/dL — ABNORMAL HIGH (ref 65–99)
Potassium: 4 mmol/L (ref 3.5–5.1)
Sodium: 134 mmol/L — ABNORMAL LOW (ref 135–145)

## 2018-04-27 NOTE — ED Provider Notes (Signed)
Patient placed in Quick Look pathway, seen and evaluated   Chief Complaint: Toe pain  HPI:   Patient presented to the ED via EMS for pain to left fourth toe and color changes x4 days.  States it stings intermittently.  Also states she had similar symptoms to her right fourth toe about 4 months ago.  ROS: + toe pain  Physical Exam:   Gen: No distress  Neuro: Awake and Alert  Skin: Warm    Focused Exam: Left fourth toe with darkening of the skin and chronic appearing ulcer in the intertriginous space.  Similar ulcer appearance to right foot.  All toes are warm and perfused though with some decreased sensation.  No purulent drainage.   Initiation of care has begun. The patient has been counseled on the process, plan, and necessity for staying for the completion/evaluation, and the remainder of the medical screening examination    Robinson, [place] N, PA-C 04/27/18 [place], Wenda Overland, MD 05/04/18 [phone]

## 2018-04-27 NOTE — ED Triage Notes (Addendum)
Patient requesting to know how much longer it will be before she's seen by a doctor. RN explained to patient that her care had been started in triage and she was seen by our [place] provider for initial assessment. Updated patient on her status and she stated that she was going to leave. Patient walking out doors as RN encouraging her to stay. Pt ambulatory with steady gait. No apparent distress noted. Patient's armband cut off.

## 2018-04-27 NOTE — ED Triage Notes (Addendum)
Pt in via EMS to triage c/o pain and color changes to her left 4th toe x 4 days, and also her right 4th toe x 4 months, both toes are blue in color per EMS, pt reports decreased sensation, her right 4th toe also has a wound in between her toes, no distress noted

## 2018-04-30 NOTE — ED Notes (Signed)
Follow up call made  Pt states she was going to follow up with her pcp   04/30/18  1313 s Merna Baldi rn

## 2018-08-17 ENCOUNTER — Other Ambulatory Visit: Payer: Self-pay | Admitting: Family Medicine

## 2018-08-17 ENCOUNTER — Ambulatory Visit
Admission: RE | Admit: 2018-08-17 | Discharge: 2018-08-17 | Disposition: A | Discharge status: Home / Self Care | Payer: Medicare Other | Source: Ambulatory Visit | Attending: Family Medicine | Admitting: Family Medicine

## 2018-08-17 DIAGNOSIS — M25512 Pain in left shoulder: Secondary | ICD-10-CM

## 2018-08-17 DIAGNOSIS — L97522 Non-pressure chronic ulcer of other part of left foot with fat layer exposed: Secondary | ICD-10-CM

## 2018-08-20 ENCOUNTER — Ambulatory Visit: Payer: Medicare Other | Admitting: Podiatry

## 2019-04-12 ENCOUNTER — Telehealth: Payer: Self-pay | Admitting: Hematology

## 2019-04-12 NOTE — Telephone Encounter (Signed)
Called patient per 5/14 sch message - unable to reach patient . Left message for patient to call back and set up appt at their convenience. Let RN know

## 2019-04-15 ENCOUNTER — Telehealth: Payer: Self-pay | Admitting: Hematology

## 2019-04-15 NOTE — Telephone Encounter (Signed)
Scheduled appt per sch msg. Called and left msg for patient.  °

## 2019-04-22 ENCOUNTER — Inpatient Hospital Stay: Payer: Medicare Other | Attending: Hematology | Admitting: Hematology

## 2019-04-22 ENCOUNTER — Inpatient Hospital Stay: Payer: Medicare Other

## 2019-04-22 ENCOUNTER — Other Ambulatory Visit: Payer: Self-pay | Admitting: *Deleted

## 2019-04-22 DIAGNOSIS — D72820 Lymphocytosis (symptomatic): Secondary | ICD-10-CM

## 2019-04-28 ENCOUNTER — Telehealth: Payer: Self-pay | Admitting: Hematology

## 2019-04-28 NOTE — Telephone Encounter (Signed)
Scheduled appt per sch msg. Called and left msg.  °

## 2019-05-05 ENCOUNTER — Ambulatory Visit: Payer: Medicare Other | Admitting: Family Medicine

## 2019-05-19 ENCOUNTER — Telehealth: Payer: Self-pay

## 2019-05-19 NOTE — Telephone Encounter (Signed)
Unable to leave vm for pt regarding prescreening questions for appointment on 6/25.

## 2019-05-20 ENCOUNTER — Inpatient Hospital Stay: Payer: Medicare Other | Attending: Hematology | Admitting: Hematology

## 2019-05-20 ENCOUNTER — Inpatient Hospital Stay: Payer: Medicare Other

## 2020-02-27 ENCOUNTER — Inpatient Hospital Stay (HOSPITAL_COMMUNITY)
Admission: EM | Admit: 2020-02-27 | Discharge: 2020-03-29 | DRG: 637 | Disposition: A | Discharge status: Skilled Nursing Facility | Payer: Medicare Other | Attending: Internal Medicine | Admitting: Internal Medicine

## 2020-02-27 ENCOUNTER — Emergency Department (HOSPITAL_COMMUNITY): Payer: Medicare Other

## 2020-02-27 ENCOUNTER — Encounter (HOSPITAL_COMMUNITY): Payer: Self-pay

## 2020-02-27 ENCOUNTER — Other Ambulatory Visit: Payer: Self-pay

## 2020-02-27 DIAGNOSIS — E876 Hypokalemia: Secondary | ICD-10-CM | POA: Diagnosis present

## 2020-02-27 DIAGNOSIS — K2980 Duodenitis without bleeding: Secondary | ICD-10-CM | POA: Diagnosis present

## 2020-02-27 DIAGNOSIS — Z66 Do not resuscitate: Secondary | ICD-10-CM | POA: Diagnosis not present

## 2020-02-27 DIAGNOSIS — I471 Supraventricular tachycardia: Secondary | ICD-10-CM | POA: Diagnosis present

## 2020-02-27 DIAGNOSIS — I110 Hypertensive heart disease with heart failure: Secondary | ICD-10-CM | POA: Diagnosis present

## 2020-02-27 DIAGNOSIS — R070 Pain in throat: Secondary | ICD-10-CM | POA: Diagnosis not present

## 2020-02-27 DIAGNOSIS — L97509 Non-pressure chronic ulcer of other part of unspecified foot with unspecified severity: Secondary | ICD-10-CM | POA: Diagnosis present

## 2020-02-27 DIAGNOSIS — M545 Low back pain: Secondary | ICD-10-CM | POA: Diagnosis present

## 2020-02-27 DIAGNOSIS — I5033 Acute on chronic diastolic (congestive) heart failure: Secondary | ICD-10-CM | POA: Diagnosis not present

## 2020-02-27 DIAGNOSIS — E860 Dehydration: Secondary | ICD-10-CM | POA: Diagnosis present

## 2020-02-27 DIAGNOSIS — K220 Achalasia of cardia: Secondary | ICD-10-CM

## 2020-02-27 DIAGNOSIS — F0390 Unspecified dementia without behavioral disturbance: Secondary | ICD-10-CM | POA: Diagnosis present

## 2020-02-27 DIAGNOSIS — M7020 Olecranon bursitis, unspecified elbow: Secondary | ICD-10-CM | POA: Diagnosis present

## 2020-02-27 DIAGNOSIS — E871 Hypo-osmolality and hyponatremia: Secondary | ICD-10-CM | POA: Diagnosis present

## 2020-02-27 DIAGNOSIS — R630 Anorexia: Secondary | ICD-10-CM | POA: Diagnosis present

## 2020-02-27 DIAGNOSIS — E11621 Type 2 diabetes mellitus with foot ulcer: Secondary | ICD-10-CM | POA: Diagnosis present

## 2020-02-27 DIAGNOSIS — Z794 Long term (current) use of insulin: Secondary | ICD-10-CM

## 2020-02-27 DIAGNOSIS — E785 Hyperlipidemia, unspecified: Secondary | ICD-10-CM | POA: Diagnosis present

## 2020-02-27 DIAGNOSIS — G9341 Metabolic encephalopathy: Secondary | ICD-10-CM | POA: Diagnosis present

## 2020-02-27 DIAGNOSIS — F419 Anxiety disorder, unspecified: Secondary | ICD-10-CM | POA: Diagnosis present

## 2020-02-27 DIAGNOSIS — Z20822 Contact with and (suspected) exposure to covid-19: Secondary | ICD-10-CM | POA: Diagnosis present

## 2020-02-27 DIAGNOSIS — E873 Alkalosis: Secondary | ICD-10-CM | POA: Diagnosis present

## 2020-02-27 DIAGNOSIS — Z515 Encounter for palliative care: Secondary | ICD-10-CM | POA: Diagnosis not present

## 2020-02-27 DIAGNOSIS — Z6841 Body Mass Index (BMI) 40.0 and over, adult: Secondary | ICD-10-CM

## 2020-02-27 DIAGNOSIS — G4700 Insomnia, unspecified: Secondary | ICD-10-CM | POA: Diagnosis present

## 2020-02-27 DIAGNOSIS — K2101 Gastro-esophageal reflux disease with esophagitis, with bleeding: Secondary | ICD-10-CM | POA: Diagnosis present

## 2020-02-27 DIAGNOSIS — E11649 Type 2 diabetes mellitus with hypoglycemia without coma: Secondary | ICD-10-CM | POA: Diagnosis not present

## 2020-02-27 DIAGNOSIS — E1110 Type 2 diabetes mellitus with ketoacidosis without coma: Principal | ICD-10-CM | POA: Diagnosis present

## 2020-02-27 DIAGNOSIS — N390 Urinary tract infection, site not specified: Secondary | ICD-10-CM | POA: Diagnosis present

## 2020-02-27 DIAGNOSIS — Z79899 Other long term (current) drug therapy: Secondary | ICD-10-CM

## 2020-02-27 DIAGNOSIS — I4510 Unspecified right bundle-branch block: Secondary | ICD-10-CM | POA: Diagnosis present

## 2020-02-27 DIAGNOSIS — J449 Chronic obstructive pulmonary disease, unspecified: Secondary | ICD-10-CM | POA: Diagnosis present

## 2020-02-27 DIAGNOSIS — E1142 Type 2 diabetes mellitus with diabetic polyneuropathy: Secondary | ICD-10-CM | POA: Diagnosis present

## 2020-02-27 DIAGNOSIS — K920 Hematemesis: Secondary | ICD-10-CM | POA: Diagnosis present

## 2020-02-27 DIAGNOSIS — F17210 Nicotine dependence, cigarettes, uncomplicated: Secondary | ICD-10-CM | POA: Diagnosis present

## 2020-02-27 DIAGNOSIS — R4182 Altered mental status, unspecified: Secondary | ICD-10-CM

## 2020-02-27 DIAGNOSIS — E875 Hyperkalemia: Secondary | ICD-10-CM | POA: Diagnosis not present

## 2020-02-27 DIAGNOSIS — R112 Nausea with vomiting, unspecified: Secondary | ICD-10-CM

## 2020-02-27 DIAGNOSIS — K2970 Gastritis, unspecified, without bleeding: Secondary | ICD-10-CM | POA: Diagnosis present

## 2020-02-27 DIAGNOSIS — B379 Candidiasis, unspecified: Secondary | ICD-10-CM | POA: Diagnosis not present

## 2020-02-27 DIAGNOSIS — K264 Chronic or unspecified duodenal ulcer with hemorrhage: Secondary | ICD-10-CM | POA: Diagnosis present

## 2020-02-27 DIAGNOSIS — R1310 Dysphagia, unspecified: Secondary | ICD-10-CM | POA: Diagnosis present

## 2020-02-27 DIAGNOSIS — E1151 Type 2 diabetes mellitus with diabetic peripheral angiopathy without gangrene: Secondary | ICD-10-CM | POA: Diagnosis present

## 2020-02-27 DIAGNOSIS — Z9114 Patient's other noncompliance with medication regimen: Secondary | ICD-10-CM

## 2020-02-27 DIAGNOSIS — G894 Chronic pain syndrome: Secondary | ICD-10-CM | POA: Diagnosis present

## 2020-02-27 LAB — POCT I-STAT EG7
Acid-Base Excess: 8 mmol/L — ABNORMAL HIGH (ref 0.0–2.0)
Bicarbonate: 30.4 mmol/L — ABNORMAL HIGH (ref 20.0–28.0)
Calcium, Ion: 0.86 mmol/L — CL (ref 1.15–1.40)
HCT: 44 % (ref 36.0–46.0)
Hemoglobin: 15 g/dL (ref 12.0–15.0)
O2 Saturation: 81 %
Patient temperature: 97.7
Potassium: 2.3 mmol/L — CL (ref 3.5–5.1)
Sodium: 127 mmol/L — ABNORMAL LOW (ref 135–145)
TCO2: 31 mmol/L (ref 22–32)
pCO2, Ven: 33.6 mmHg — ABNORMAL LOW (ref 44.0–60.0)
pH, Ven: 7.564 — ABNORMAL HIGH (ref 7.250–7.430)
pO2, Ven: 37 mmHg (ref 32.0–45.0)

## 2020-02-27 LAB — CBC WITH DIFFERENTIAL/PLATELET
Abs Immature Granulocytes: 0.1 10*3/uL — ABNORMAL HIGH (ref 0.00–0.07)
Basophils Absolute: 0 10*3/uL (ref 0.0–0.1)
Basophils Relative: 0 %
Eosinophils Absolute: 0 10*3/uL (ref 0.0–0.5)
Eosinophils Relative: 0 %
HCT: 44.7 % (ref 36.0–46.0)
Hemoglobin: 14.2 g/dL (ref 12.0–15.0)
Immature Granulocytes: 1 %
Lymphocytes Relative: 10 %
Lymphs Abs: 1.6 10*3/uL (ref 0.7–4.0)
MCH: 23.8 pg — ABNORMAL LOW (ref 26.0–34.0)
MCHC: 31.8 g/dL (ref 30.0–36.0)
MCV: 74.9 fL — ABNORMAL LOW (ref 80.0–100.0)
Monocytes Absolute: 0.4 10*3/uL (ref 0.1–1.0)
Monocytes Relative: 2 %
Neutro Abs: 13.8 10*3/uL — ABNORMAL HIGH (ref 1.7–7.7)
Neutrophils Relative %: 87 %
Platelets: 239 10*3/uL (ref 150–400)
RBC: 5.97 MIL/uL — ABNORMAL HIGH (ref 3.87–5.11)
RDW: 13.9 % (ref 11.5–15.5)
WBC: 15.9 10*3/uL — ABNORMAL HIGH (ref 4.0–10.5)
nRBC: 0 % (ref 0.0–0.2)

## 2020-02-27 LAB — COMPREHENSIVE METABOLIC PANEL
ALT: 12 U/L (ref 0–44)
AST: 15 U/L (ref 15–41)
Albumin: 2.6 g/dL — ABNORMAL LOW (ref 3.5–5.0)
Alkaline Phosphatase: 108 U/L (ref 38–126)
Anion gap: 18 — ABNORMAL HIGH (ref 5–15)
BUN: 8 mg/dL (ref 8–23)
CO2: 26 mmol/L (ref 22–32)
Calcium: 8 mg/dL — ABNORMAL LOW (ref 8.9–10.3)
Chloride: 85 mmol/L — ABNORMAL LOW (ref 98–111)
Creatinine, Ser: 0.94 mg/dL (ref 0.44–1.00)
GFR calc Af Amer: >60 mL/min (ref >60)
GFR calc non Af Amer: 59 mL/min — ABNORMAL LOW (ref >60)
Glucose, Bld: 383 mg/dL — ABNORMAL HIGH (ref 70–99)
Potassium: 2.4 mmol/L — CL (ref 3.5–5.1)
Sodium: 129 mmol/L — ABNORMAL LOW (ref 135–145)
Total Bilirubin: 0.9 mg/dL (ref 0.3–1.2)
Total Protein: 5.8 g/dL — ABNORMAL LOW (ref 6.5–8.1)

## 2020-02-27 LAB — ETHANOL: Alcohol, Ethyl (B): <10 mg/dL (ref <10)

## 2020-02-27 LAB — POC SARS CORONAVIRUS 2 AG -  ED: SARS Coronavirus 2 Ag: NEGATIVE

## 2020-02-27 LAB — CBG MONITORING, ED: Glucose-Capillary: 420 mg/dL — ABNORMAL HIGH (ref 70–99)

## 2020-02-27 LAB — BETA-HYDROXYBUTYRIC ACID: Beta-Hydroxybutyric Acid: 2.42 mmol/L — ABNORMAL HIGH (ref 0.05–0.27)

## 2020-02-27 LAB — SALICYLATE LEVEL: Salicylate Lvl: <7 mg/dL — ABNORMAL LOW (ref 7.0–30.0)

## 2020-02-27 LAB — LIPASE, BLOOD: Lipase: 15 U/L (ref 11–51)

## 2020-02-27 LAB — PROTIME-INR
INR: 1 (ref 0.8–1.2)
Prothrombin Time: 13.4 seconds (ref 11.4–15.2)

## 2020-02-27 LAB — TYPE AND SCREEN
ABO/RH(D): A POS
Antibody Screen: NEGATIVE

## 2020-02-27 LAB — LACTIC ACID, PLASMA: Lactic Acid, Venous: 2.8 mmol/L (ref 0.5–1.9)

## 2020-02-27 LAB — ACETAMINOPHEN LEVEL: Acetaminophen (Tylenol), Serum: <10 ug/mL — ABNORMAL LOW (ref 10–30)

## 2020-02-27 LAB — OSMOLALITY: Osmolality: 286 mOsm/kg (ref 275–295)

## 2020-02-27 MED ORDER — SODIUM CHLORIDE 0.9 % IV SOLN
80.0000 mg | Freq: Once | INTRAVENOUS | Status: AC
  Administered 2020-02-27: 80 mg via INTRAVENOUS
  Filled 2020-02-27: qty 80

## 2020-02-27 MED ORDER — SODIUM CHLORIDE 0.9 % IV BOLUS
500.0000 mL | Freq: Once | INTRAVENOUS | Status: AC
  Administered 2020-02-27: 500 mL via INTRAVENOUS

## 2020-02-27 MED ORDER — POTASSIUM CHLORIDE 10 MEQ/100ML IV SOLN
10.0000 meq | INTRAVENOUS | Status: AC
  Administered 2020-02-27 – 2020-02-28 (×5): 10 meq via INTRAVENOUS
  Filled 2020-02-27 (×5): qty 100

## 2020-02-27 NOTE — ED Provider Notes (Signed)
[facility] EMERGENCY DEPARTMENT Provider Note   CSN: [id number] Arrival date & time: 02/27/20  2022     History Chief Complaint  Patient presents with  . hematemesis  . Fatigue  . Hyperglycemia    Yvonne [name] is a 74 y.o. female with a past medical history of CHF, COPD, hypertension, hyperlipidemia, diabetes, chronic pain syndrome, who presents today for evaluation of reported hematemesis, malaise, and hyperglycemia. History is limited as patient is unable to provide history.  Level 5 caveat altered mental status  According to EMS patient was on her bed surrounded by a large amount of blood.  Patient's family member arrives and reports that she had found patient in her bed today with large volume hematemesis.  She is unsure of patient's recent illness as she has not seen patient in "a while."   Patient was hyperglycemic with EMS, she was given 750 mL normal saline, and 4 mg of Zofran.    HPI     Past Medical History:  Diagnosis Date  . Asthma   . Blood transfusion   . Cervicalgia   . CHF (congestive heart failure) ([place])   . Chronic pain syndrome   . COPD (chronic obstructive pulmonary disease) ([place])   . Diabetes mellitus   . Disturbance of skin sensation   . Hyperlipidemia   . Hypertension   . Lumbago   . Olecranon bursitis   . Pain in joint, hand   . Polyneuropathy in diabetes(357.2)     Patient Active Problem List   Diagnosis Date Noted  . Diabetic neuropathy, painful ([place]) 07/20/2015  . Insomnia due to anxiety and fear 07/20/2015  . Anxiety disorder 10/03/2014  . Preventative health care 10/03/2014  . Type 2 diabetes mellitus without complication ([place]) 41/96/2229  . Depression 06/23/2014  . Essential hypertension 06/23/2014  . Peripheral neuropathic pain 06/23/2014  . Gastroesophageal reflux disease without esophagitis 06/23/2014  . Dyslipidemia 06/23/2014  . Chronic neck pain 03/09/2012  . Right arm pain 03/09/2012  . Chronic  low back pain 03/09/2012  . Right knee pain 03/09/2012  . Joint stiffness of hand 03/09/2012  . Encephalopathy 02/05/2012  . Hypoxia 02/01/2012  . Fever 02/01/2012  . Leukocytosis 02/01/2012  . Bronchitis 02/01/2012  . Confusion 02/01/2012  . Diarrhea 02/01/2012  . COPD (chronic obstructive pulmonary disease) ([place]) 02/01/2012  . CAP (community acquired pneumonia) 02/01/2012  . Diabetes mellitus ([place])     Past Surgical History:  Procedure Laterality Date  . ABDOMINAL HYSTERECTOMY    . BACK SURGERY     from bacteria     OB History   No obstetric history on file.     History reviewed. No pertinent family history.  Social History   Tobacco Use  . Smoking status: Current Some Day Smoker    Packs/day: 0.50    Years: 30.00    Pack years: 15.00    Types: Cigarettes  . Smokeless tobacco: Never Used  . Tobacco comment: was dx with COPD  Substance Use Topics  . Alcohol use: No  . Drug use: No    Home Medications Prior to Admission medications   Medication Sig Start Date End Date Taking? Authorizing Provider  acetaminophen-codeine (TYLENOL #3) 300-30 MG per tablet Take 1 tablet by mouth every 8 (eight) hours as needed for moderate pain. 02/21/15   Tresa Garter, MD  albuterol (PROVENTIL HFA;VENTOLIN HFA) 108 (90 BASE) MCG/ACT inhaler Inhale 2 puffs into the lungs every 6 (six) hours as needed for  wheezing. 07/20/15 11/19/16  Tresa Garter, MD  ALPRAZolam Duanne Moron) 0.25 MG tablet Take 0.25 mg by mouth at bedtime as needed.  08/27/16   [provider]  BELSOMRA 20 MG TABS Take 20 mg by mouth daily.  08/27/16   [provider]  gabapentin (NEURONTIN) 400 MG capsule TAKE 1 CAPSULE (400 MG TOTAL) BY MOUTH 4 (FOUR) TIMES DAILY. If your foot pain is severe, you can take one extra tablet in the afternoon 02/21/15   Angelica Chessman E, MD  glucose monitoring kit (FREESTYLE) monitoring kit 1 each by Does not apply route as needed for other. Test blood sugar 3  times daily 06/23/14   Angelica Chessman E, MD  insulin glargine (LANTUS) 100 UNIT/ML injection Inject 0.25 mLs (25 Units total) into the skin at bedtime. 07/20/15   Tresa Garter, MD  metFORMIN (GLUCOPHAGE) 1000 MG tablet Take 1 tablet (1,000 mg total) by mouth 2 (two) times daily with a meal. 02/21/15   Jegede, Olugbemiga E, MD  pravastatin (PRAVACHOL) 20 MG tablet Take 1 tablet (20 mg total) by mouth daily. 02/21/15   Tresa Garter, MD  ranitidine (ZANTAC) 150 MG capsule Take 1 capsule (150 mg total) by mouth 2 (two) times daily. 02/21/15   Tresa Garter, MD    Allergies    Patient has no known allergies.  Review of Systems   Review of Systems  Unable to perform ROS: Mental status change    Physical Exam Updated Vital Signs BP (!) 139/93   Pulse 91   Temp 97.7 F (36.5 C) (Oral)   Resp 19   Ht 5' 2"  (1.575 m)   Wt 103 kg   SpO2 97%   BMI 41.53 kg/m   Physical Exam Vitals and nursing note reviewed.  Constitutional:      Appearance: She is well-developed. She is ill-appearing and diaphoretic.  HENT:     Head: Normocephalic and atraumatic.  Eyes:     General: No scleral icterus.       Right eye: No discharge.        Left eye: No discharge.     Conjunctiva/sclera: Conjunctivae normal.  Cardiovascular:     Rate and Rhythm: Normal rate and regular rhythm.     Comments: 2+ radial pulses bilaterally. Pulmonary:     Effort: Pulmonary effort is normal. No respiratory distress.     Breath sounds: No stridor.  Abdominal:     General: There is no distension.  Musculoskeletal:        General: No deformity.     Cervical back: Normal range of motion.  Skin:    General: Skin is warm.     Comments: Superficial ulcer visualized on foot.  Neurological:     Motor: No abnormal muscle tone.     Comments: Patient awakens to voice.  She is oriented to person, and place not to year or events leading up to her being here.  She is able to move her bilateral arms and  legs.  Her speech is slightly slurred and difficult to understand.  Psychiatric:     Comments: Unable to assess secondary to mental status change.     ED Results / Procedures / Treatments   Labs (all labs ordered are listed, but only abnormal results are displayed) Labs Reviewed  COMPREHENSIVE METABOLIC PANEL - Abnormal; Notable for the following components:      Result Value   Sodium 129 (*)    Potassium 2.4 (*)  Chloride 85 (*)    Glucose, Bld 383 (*)    Calcium 8.0 (*)    Total Protein 5.8 (*)    Albumin 2.6 (*)    GFR calc non Af Amer 59 (*)    Anion gap 18 (*)    All other components within normal limits  CBC WITH DIFFERENTIAL/PLATELET - Abnormal; Notable for the following components:   WBC 15.9 (*)    RBC 5.97 (*)    MCV 74.9 (*)    MCH 23.8 (*)    Neutro Abs 13.8 (*)    Abs Immature Granulocytes 0.10 (*)    All other components within normal limits  URINALYSIS, ROUTINE W REFLEX MICROSCOPIC - Abnormal; Notable for the following components:   APPearance CLOUDY (*)    Glucose, UA >=500 (*)    Hgb urine dipstick SMALL (*)    Ketones, ur 20 (*)    Protein, ur 30 (*)    Leukocytes,Ua LARGE (*)    WBC, UA >50 (*)    Bacteria, UA MANY (*)    All other components within normal limits  BETA-HYDROXYBUTYRIC ACID - Abnormal; Notable for the following components:   Beta-Hydroxybutyric Acid 2.42 (*)    All other components within normal limits  LACTIC ACID, PLASMA - Abnormal; Notable for the following components:   Lactic Acid, Venous 2.8 (*)    All other components within normal limits  RAPID URINE DRUG SCREEN, HOSP PERFORMED - Abnormal; Notable for the following components:   Benzodiazepines POSITIVE (*)    All other components within normal limits  ACETAMINOPHEN LEVEL - Abnormal; Notable for the following components:   Acetaminophen (Tylenol), Serum <10 (*)    All other components within normal limits  SALICYLATE LEVEL - Abnormal; Notable for the following components:    Salicylate Lvl <6.3 (*)    All other components within normal limits  CBG MONITORING, ED - Abnormal; Notable for the following components:   Glucose-Capillary 420 (*)    All other components within normal limits  POCT I-STAT EG7 - Abnormal; Notable for the following components:   pH, Ven 7.564 (*)    pCO2, Ven 33.6 (*)    Bicarbonate 30.4 (*)    Acid-Base Excess 8.0 (*)    Sodium 127 (*)    Potassium 2.3 (*)    Calcium, Ion 0.86 (*)    All other components within normal limits  URINE CULTURE  CULTURE, BLOOD (ROUTINE X 2)  CULTURE, BLOOD (ROUTINE X 2)  SARS CORONAVIRUS 2 (TAT 6-24 HRS)  LIPASE, BLOOD  PROTIME-INR  OSMOLALITY  ETHANOL  LACTIC ACID, PLASMA  AMMONIA  MAGNESIUM  CK  TSH  I-STAT VENOUS BLOOD GAS, ED  POC SARS CORONAVIRUS 2 AG -  ED  POC OCCULT BLOOD, ED  TYPE AND SCREEN  ABO/RH    EKG EKG Interpretation  Date/Time:  Sunday February 27 2020 20:42:44 EDT Ventricular Rate:  78 PR Interval:    QRS Duration: 168 QT Interval:  435 QTC Calculation: 496 R Axis:   -78 Text Interpretation: Sinus rhythm Atrial premature complexes in couplets Borderline short PR interval Right bundle branch block Borderline ST depression, lateral leads No acute changes No significant change since last tracing Nonspecific ST and T wave abnormality Confirmed by Varney Biles [phone]) on 02/27/2020 9:11:27 PM   Radiology CT Head Wo Contrast  Result Date: 02/27/2020 CLINICAL DATA:  Encephalopathy EXAM: CT HEAD WITHOUT CONTRAST TECHNIQUE: Contiguous axial images were obtained from the base of the skull through the vertex  without intravenous contrast. COMPARISON:  02/01/2012 FINDINGS: Brain: There is no mass, hemorrhage or extra-axial collection. The size and configuration of the ventricles and extra-axial CSF spaces are normal. There is hypoattenuation of the white matter, most commonly indicating chronic small vessel disease. Vascular: No abnormal hyperdensity of the major intracranial  arteries or dural venous sinuses. No intracranial atherosclerosis. Skull: The visualized skull base, calvarium and extracranial soft tissues are normal. Sinuses/Orbits: No fluid levels or advanced mucosal thickening of the visualized paranasal sinuses. No mastoid or middle ear effusion. The orbits are normal. IMPRESSION: Chronic small vessel disease without acute intracranial abnormality. Electronically Signed   By: Ulyses Jarred M.D.   On: 02/27/2020 23:29   DG Chest Port 1 View  Result Date: 02/27/2020 CLINICAL DATA:  Altered mental status. Malaise. Hematemesis. EXAM: PORTABLE CHEST 1 VIEW COMPARISON:  Chest CT 01/09/2018 FINDINGS: The cardiomediastinal contours are normal. Atherosclerosis of the thoracic aorta. Pulmonary vasculature is normal. No evidence of pneumomediastinum. No consolidation, pleural effusion, or pneumothorax. No acute osseous abnormalities are seen. IMPRESSION: No acute abnormality. Aortic Atherosclerosis (ICD10-I70.0). Electronically Signed   By: Keith Rake M.D.   On: 02/27/2020 21:54    Procedures .Critical Care Performed by: Lorin Glass, PA-C Authorized by: Lorin Glass, PA-C   Critical care provider statement:    Critical care time (minutes):  45   Critical care was time spent personally by me on the following activities:  Discussions with consultants, evaluation of patient's response to treatment, examination of patient, ordering and performing treatments and interventions, ordering and review of laboratory studies, ordering and review of radiographic studies, pulse oximetry, re-evaluation of patient's condition, obtaining history from patient or surrogate and review of old charts   (including critical care time)  Medications Ordered in ED Medications  potassium chloride 10 mEq in 100 mL IVPB (10 mEq Intravenous New Bag/Given 02/27/20 2338)  cefTRIAXone (ROCEPHIN) 1 g in sodium chloride 0.9 % 100 mL IVPB (has no administration in time range)    pantoprazole (PROTONIX) 80 mg in sodium chloride 0.9 % 100 mL IVPB (0 mg Intravenous Stopped 02/27/20 2334)  sodium chloride 0.9 % bolus 500 mL (500 mLs Intravenous New Bag/Given 02/27/20 2337)    ED Course  I have reviewed the triage vital signs and the nursing notes.  Pertinent labs & imaging results that were available during my care of the patient were reviewed by me and considered in my medical decision making (see chart for details).  Clinical Course as of Feb 27 30  Sun Feb 27, 2020  2307 IV K ordered  Potassium(!!): 2.4 [EH]  2315 Concern for DKA, but at this time with hypokalemia do not think it is safe to start insulin.     Potassium(!!): 2.4 [EH]    Clinical Course User Index [EH] Ollen Gross   MDM Rules/Calculators/A&P                     Patient is a 74 year old woman who presents today for evaluation of altered mental status.  Recent events are unclear at this time as patient is altered.  Initially she was given a Protonix bolus due to concern for possible GI bleed.    Labs are obtained and reviewed, white count is slightly elevated however no clear infectious source.  She is afebrile, not tachycardic or tachypneic.  She is not anemic.  CMP shows hypokalemia with a potassium of 2.4.  In addition her sugar is significantly elevated with EMS  and here.  On CMP it is 383.  She has an anion gap of 18.  VBG shows alkalosis with a pH of 7.56.  Her potassium is low at 2.3, however given it is significantly abnormal with concern for possible underlying renal cause will wait for verification.  Once CMP verified she was started on IV potassium.  In addition she was noted to be hypocalcemic. Beta hydroxybutyric acid is elevated at 2.42 which is consistent with DKA, however she is alkalotic instead of acidotic.  Given her significant hypokalemia insulin was held.  She received IV fluids while in the field, and given an additional 500 mL bolus here.  Her lactic was not  over 4 and she was not hypotensive so she did not get a 30/kg fluid bolus.    Urine shows evidence of infection with over 50 white cells, many bacteria and only 0-5 squamous epithelial cells.  Rocephin is started.  UDS is positive for benzodiazepines.  Here she remained afebrile, not tachycardic or tachypneic.  This patient was seen as a shared visit with Dr. Kathrynn Humble.  I spoke with Dr. Daryll Drown who will see patient.  Note: Portions of this report may have been transcribed using voice recognition software. Every effort was made to ensure accuracy; however, inadvertent computerized transcription errors may be present  Final Clinical Impression(s) / ED Diagnoses Final diagnoses:  Hypokalemia  Dehydration  Altered mental status, unspecified altered mental status type    Rx / DC Orders ED Discharge Orders    None       Lorin Glass, PA-C 02/28/20 Venancio Poisson, MD 02/28/20 [phone]

## 2020-02-27 NOTE — ED Notes (Signed)
PA Phylliss Bob informed of EG7 results

## 2020-02-27 NOTE — ED Notes (Signed)
PA Phylliss Bob informed pt POC covid test neg

## 2020-02-27 NOTE — ED Triage Notes (Addendum)
Pt arrives via [facility] from home c/o hematemesis, malaise, and hyperglycemia x1 day. VS: BP 150/74, HR 90, SPO2 98% RA, CBG 501. GCS 14, A&Ox2. NS bolus (748mL) and ondansetron 4mg  administered PTA

## 2020-02-28 ENCOUNTER — Encounter (HOSPITAL_COMMUNITY): Payer: Self-pay | Admitting: Internal Medicine

## 2020-02-28 ENCOUNTER — Inpatient Hospital Stay (HOSPITAL_COMMUNITY): Payer: Medicare Other

## 2020-02-28 DIAGNOSIS — E876 Hypokalemia: Secondary | ICD-10-CM | POA: Diagnosis not present

## 2020-02-28 DIAGNOSIS — G894 Chronic pain syndrome: Secondary | ICD-10-CM | POA: Diagnosis present

## 2020-02-28 DIAGNOSIS — I471 Supraventricular tachycardia: Secondary | ICD-10-CM | POA: Diagnosis present

## 2020-02-28 DIAGNOSIS — K264 Chronic or unspecified duodenal ulcer with hemorrhage: Secondary | ICD-10-CM | POA: Diagnosis present

## 2020-02-28 DIAGNOSIS — E1110 Type 2 diabetes mellitus with ketoacidosis without coma: Secondary | ICD-10-CM | POA: Diagnosis present

## 2020-02-28 DIAGNOSIS — N390 Urinary tract infection, site not specified: Secondary | ICD-10-CM | POA: Diagnosis present

## 2020-02-28 DIAGNOSIS — R112 Nausea with vomiting, unspecified: Secondary | ICD-10-CM

## 2020-02-28 DIAGNOSIS — E871 Hypo-osmolality and hyponatremia: Secondary | ICD-10-CM | POA: Diagnosis present

## 2020-02-28 DIAGNOSIS — G9341 Metabolic encephalopathy: Secondary | ICD-10-CM | POA: Diagnosis present

## 2020-02-28 DIAGNOSIS — I70201 Unspecified atherosclerosis of native arteries of extremities, right leg: Secondary | ICD-10-CM | POA: Diagnosis not present

## 2020-02-28 DIAGNOSIS — R627 Adult failure to thrive: Secondary | ICD-10-CM

## 2020-02-28 DIAGNOSIS — E8809 Other disorders of plasma-protein metabolism, not elsewhere classified: Secondary | ICD-10-CM

## 2020-02-28 DIAGNOSIS — K2990 Gastroduodenitis, unspecified, without bleeding: Secondary | ICD-10-CM | POA: Diagnosis not present

## 2020-02-28 DIAGNOSIS — Z515 Encounter for palliative care: Secondary | ICD-10-CM | POA: Diagnosis not present

## 2020-02-28 DIAGNOSIS — R4182 Altered mental status, unspecified: Secondary | ICD-10-CM

## 2020-02-28 DIAGNOSIS — E860 Dehydration: Secondary | ICD-10-CM | POA: Diagnosis present

## 2020-02-28 DIAGNOSIS — K220 Achalasia of cardia: Secondary | ICD-10-CM | POA: Diagnosis not present

## 2020-02-28 DIAGNOSIS — L0390 Cellulitis, unspecified: Secondary | ICD-10-CM | POA: Diagnosis not present

## 2020-02-28 DIAGNOSIS — E11621 Type 2 diabetes mellitus with foot ulcer: Secondary | ICD-10-CM | POA: Diagnosis present

## 2020-02-28 DIAGNOSIS — Z20822 Contact with and (suspected) exposure to covid-19: Secondary | ICD-10-CM | POA: Diagnosis present

## 2020-02-28 DIAGNOSIS — Z6841 Body Mass Index (BMI) 40.0 and over, adult: Secondary | ICD-10-CM | POA: Diagnosis not present

## 2020-02-28 DIAGNOSIS — K2101 Gastro-esophageal reflux disease with esophagitis, with bleeding: Secondary | ICD-10-CM | POA: Diagnosis present

## 2020-02-28 DIAGNOSIS — J449 Chronic obstructive pulmonary disease, unspecified: Secondary | ICD-10-CM | POA: Diagnosis present

## 2020-02-28 DIAGNOSIS — Z7189 Other specified counseling: Secondary | ICD-10-CM | POA: Diagnosis not present

## 2020-02-28 DIAGNOSIS — I5033 Acute on chronic diastolic (congestive) heart failure: Secondary | ICD-10-CM | POA: Diagnosis not present

## 2020-02-28 DIAGNOSIS — Z66 Do not resuscitate: Secondary | ICD-10-CM | POA: Diagnosis not present

## 2020-02-28 DIAGNOSIS — E1142 Type 2 diabetes mellitus with diabetic polyneuropathy: Secondary | ICD-10-CM | POA: Diagnosis present

## 2020-02-28 DIAGNOSIS — E1100 Type 2 diabetes mellitus with hyperosmolarity without nonketotic hyperglycemic-hyperosmolar coma (NKHHC): Secondary | ICD-10-CM | POA: Diagnosis not present

## 2020-02-28 DIAGNOSIS — Z9119 Patient's noncompliance with other medical treatment and regimen: Secondary | ICD-10-CM

## 2020-02-28 DIAGNOSIS — R1319 Other dysphagia: Secondary | ICD-10-CM | POA: Diagnosis not present

## 2020-02-28 DIAGNOSIS — E785 Hyperlipidemia, unspecified: Secondary | ICD-10-CM | POA: Diagnosis present

## 2020-02-28 DIAGNOSIS — K920 Hematemesis: Secondary | ICD-10-CM | POA: Diagnosis present

## 2020-02-28 DIAGNOSIS — R1310 Dysphagia, unspecified: Secondary | ICD-10-CM | POA: Diagnosis present

## 2020-02-28 DIAGNOSIS — Z9114 Patient's other noncompliance with medication regimen: Secondary | ICD-10-CM | POA: Diagnosis not present

## 2020-02-28 DIAGNOSIS — F419 Anxiety disorder, unspecified: Secondary | ICD-10-CM

## 2020-02-28 DIAGNOSIS — G6289 Other specified polyneuropathies: Secondary | ICD-10-CM | POA: Diagnosis not present

## 2020-02-28 DIAGNOSIS — E873 Alkalosis: Secondary | ICD-10-CM | POA: Diagnosis present

## 2020-02-28 DIAGNOSIS — E118 Type 2 diabetes mellitus with unspecified complications: Secondary | ICD-10-CM | POA: Diagnosis not present

## 2020-02-28 DIAGNOSIS — R630 Anorexia: Secondary | ICD-10-CM | POA: Diagnosis not present

## 2020-02-28 DIAGNOSIS — F0390 Unspecified dementia without behavioral disturbance: Secondary | ICD-10-CM | POA: Diagnosis present

## 2020-02-28 LAB — COMPREHENSIVE METABOLIC PANEL
ALT: 11 U/L (ref 0–44)
AST: 14 U/L — ABNORMAL LOW (ref 15–41)
Albumin: 2.4 g/dL — ABNORMAL LOW (ref 3.5–5.0)
Alkaline Phosphatase: 95 U/L (ref 38–126)
Anion gap: 13 (ref 5–15)
BUN: 9 mg/dL (ref 8–23)
CO2: 28 mmol/L (ref 22–32)
Calcium: 8.4 mg/dL — ABNORMAL LOW (ref 8.9–10.3)
Chloride: 90 mmol/L — ABNORMAL LOW (ref 98–111)
Creatinine, Ser: 0.79 mg/dL (ref 0.44–1.00)
GFR calc Af Amer: >60 mL/min (ref >60)
GFR calc non Af Amer: >60 mL/min (ref >60)
Glucose, Bld: 307 mg/dL — ABNORMAL HIGH (ref 70–99)
Potassium: 3.4 mmol/L — ABNORMAL LOW (ref 3.5–5.1)
Sodium: 131 mmol/L — ABNORMAL LOW (ref 135–145)
Total Bilirubin: 0.6 mg/dL (ref 0.3–1.2)
Total Protein: 5.3 g/dL — ABNORMAL LOW (ref 6.5–8.1)

## 2020-02-28 LAB — CBC WITH DIFFERENTIAL/PLATELET
Abs Immature Granulocytes: 0.08 10*3/uL — ABNORMAL HIGH (ref 0.00–0.07)
Basophils Absolute: 0 10*3/uL (ref 0.0–0.1)
Basophils Relative: 0 %
Eosinophils Absolute: 0 10*3/uL (ref 0.0–0.5)
Eosinophils Relative: 0 %
HCT: 37.3 % (ref 36.0–46.0)
Hemoglobin: 12 g/dL (ref 12.0–15.0)
Immature Granulocytes: 1 %
Lymphocytes Relative: 19 %
Lymphs Abs: 2.7 10*3/uL (ref 0.7–4.0)
MCH: 23.9 pg — ABNORMAL LOW (ref 26.0–34.0)
MCHC: 32.2 g/dL (ref 30.0–36.0)
MCV: 74.2 fL — ABNORMAL LOW (ref 80.0–100.0)
Monocytes Absolute: 0.6 10*3/uL (ref 0.1–1.0)
Monocytes Relative: 4 %
Neutro Abs: 10.5 10*3/uL — ABNORMAL HIGH (ref 1.7–7.7)
Neutrophils Relative %: 76 %
Platelets: 222 10*3/uL (ref 150–400)
RBC: 5.03 MIL/uL (ref 3.87–5.11)
RDW: 13.8 % (ref 11.5–15.5)
WBC: 14 10*3/uL — ABNORMAL HIGH (ref 4.0–10.5)
nRBC: 0 % (ref 0.0–0.2)

## 2020-02-28 LAB — RAPID URINE DRUG SCREEN, HOSP PERFORMED
Amphetamines: NOT DETECTED
Barbiturates: NOT DETECTED
Benzodiazepines: POSITIVE — AB
Cocaine: NOT DETECTED
Opiates: NOT DETECTED
Tetrahydrocannabinol: NOT DETECTED

## 2020-02-28 LAB — SARS CORONAVIRUS 2 (TAT 6-24 HRS): SARS Coronavirus 2: NEGATIVE

## 2020-02-28 LAB — URINALYSIS, ROUTINE W REFLEX MICROSCOPIC
Bilirubin Urine: NEGATIVE
Glucose, UA: >=500 mg/dL — AB
Ketones, ur: 20 mg/dL — AB
Nitrite: NEGATIVE
Protein, ur: 30 mg/dL — AB
Specific Gravity, Urine: 1.017 (ref 1.005–1.030)
WBC, UA: >50 WBC/hpf — ABNORMAL HIGH (ref 0–5)
pH: 5 (ref 5.0–8.0)

## 2020-02-28 LAB — GLUCOSE, CAPILLARY
Glucose-Capillary: 331 mg/dL — ABNORMAL HIGH (ref 70–99)
Glucose-Capillary: 164 mg/dL — ABNORMAL HIGH (ref 70–99)
Glucose-Capillary: 111 mg/dL — ABNORMAL HIGH (ref 70–99)
Glucose-Capillary: 121 mg/dL — ABNORMAL HIGH (ref 70–99)
Glucose-Capillary: 111 mg/dL — ABNORMAL HIGH (ref 70–99)

## 2020-02-28 LAB — MRSA PCR SCREENING: MRSA by PCR: NEGATIVE

## 2020-02-28 LAB — AMMONIA: Ammonia: 31 umol/L (ref 9–35)

## 2020-02-28 LAB — LACTIC ACID, PLASMA: Lactic Acid, Venous: 1.8 mmol/L (ref 0.5–1.9)

## 2020-02-28 LAB — TSH
TSH: 0.498 u[IU]/mL (ref 0.350–4.500)
TSH: 0.545 u[IU]/mL (ref 0.350–4.500)

## 2020-02-28 LAB — POCT I-STAT 7, (LYTES, BLD GAS, ICA,H+H)
Acid-Base Excess: 8 mmol/L — ABNORMAL HIGH (ref 0.0–2.0)
Bicarbonate: 31.3 mmol/L — ABNORMAL HIGH (ref 20.0–28.0)
Calcium, Ion: 1.06 mmol/L — ABNORMAL LOW (ref 1.15–1.40)
HCT: 41 % (ref 36.0–46.0)
Hemoglobin: 13.9 g/dL (ref 12.0–15.0)
O2 Saturation: 97 %
Patient temperature: 97.7
Potassium: 2.4 mmol/L — CL (ref 3.5–5.1)
Sodium: 127 mmol/L — ABNORMAL LOW (ref 135–145)
TCO2: 32 mmol/L (ref 22–32)
pCO2 arterial: 36.3 mmHg (ref 32.0–48.0)
pH, Arterial: 7.541 — ABNORMAL HIGH (ref 7.350–7.450)
pO2, Arterial: 76 mmHg — ABNORMAL LOW (ref 83.0–108.0)

## 2020-02-28 LAB — PHOSPHORUS
Phosphorus: 2.4 mg/dL — ABNORMAL LOW (ref 2.5–4.6)
Phosphorus: 2.2 mg/dL — ABNORMAL LOW (ref 2.5–4.6)

## 2020-02-28 LAB — CK: Total CK: 31 U/L — ABNORMAL LOW (ref 38–234)

## 2020-02-28 LAB — MAGNESIUM
Magnesium: 1.2 mg/dL — ABNORMAL LOW (ref 1.7–2.4)
Magnesium: 1.3 mg/dL — ABNORMAL LOW (ref 1.7–2.4)

## 2020-02-28 LAB — HEMOGLOBIN A1C
Hgb A1c MFr Bld: 10.1 % — ABNORMAL HIGH (ref 4.8–5.6)
Mean Plasma Glucose: 243.17 mg/dL

## 2020-02-28 LAB — ABO/RH: ABO/RH(D): A POS

## 2020-02-28 MED ORDER — GABAPENTIN 100 MG PO CAPS
100.0000 mg | ORAL_CAPSULE | Freq: Three times a day (TID) | ORAL | Status: DC
  Administered 2020-02-28 – 2020-03-29 (×84): 100 mg via ORAL
  Filled 2020-02-28 (×86): qty 1

## 2020-02-28 MED ORDER — COLLAGENASE 250 UNIT/GM EX OINT
TOPICAL_OINTMENT | Freq: Every day | CUTANEOUS | Status: AC
  Filled 2020-02-28: qty 30

## 2020-02-28 MED ORDER — ONDANSETRON HCL 4 MG/2ML IJ SOLN
4.0000 mg | Freq: Four times a day (QID) | INTRAMUSCULAR | Status: DC | PRN
  Administered 2020-02-28 – 2020-03-05 (×10): 4 mg via INTRAVENOUS
  Filled 2020-02-28 (×11): qty 2

## 2020-02-28 MED ORDER — SODIUM CHLORIDE 0.9 % IV SOLN: INTRAVENOUS | Status: AC

## 2020-02-28 MED ORDER — MAGNESIUM SULFATE 4 GM/100ML IV SOLN
4.0000 g | Freq: Once | INTRAVENOUS | Status: AC
  Administered 2020-02-28: 17:00:00 4 g via INTRAVENOUS
  Filled 2020-02-28: qty 100

## 2020-02-28 MED ORDER — DULOXETINE HCL 30 MG PO CPEP
30.0000 mg | ORAL_CAPSULE | Freq: Every day | ORAL | Status: DC
  Administered 2020-02-29 – 2020-03-29 (×28): 30 mg via ORAL
  Filled 2020-02-28 (×29): qty 1

## 2020-02-28 MED ORDER — INSULIN ASPART 100 UNIT/ML ~~LOC~~ SOLN
0.0000 [IU] | SUBCUTANEOUS | Status: DC
  Administered 2020-02-28: 2 [IU] via SUBCUTANEOUS
  Administered 2020-02-28: 3 [IU] via SUBCUTANEOUS
  Administered 2020-02-28: 11 [IU] via SUBCUTANEOUS
  Administered 2020-02-29 – 2020-03-02 (×11): 3 [IU] via SUBCUTANEOUS
  Administered 2020-03-02: 5 [IU] via SUBCUTANEOUS
  Administered 2020-03-02: 3 [IU] via SUBCUTANEOUS
  Administered 2020-03-03 (×3): 2 [IU] via SUBCUTANEOUS
  Administered 2020-03-03: 3 [IU] via SUBCUTANEOUS
  Administered 2020-03-03: 2 [IU] via SUBCUTANEOUS
  Administered 2020-03-03: 3 [IU] via SUBCUTANEOUS
  Administered 2020-03-04 – 2020-03-06 (×5): 2 [IU] via SUBCUTANEOUS
  Administered 2020-03-06: 17:00:00 3 [IU] via SUBCUTANEOUS
  Administered 2020-03-06 – 2020-03-07 (×5): 2 [IU] via SUBCUTANEOUS
  Administered 2020-03-08 (×2): 3 [IU] via SUBCUTANEOUS
  Administered 2020-03-08: 18:00:00 2 [IU] via SUBCUTANEOUS
  Administered 2020-03-09 (×2): 3 [IU] via SUBCUTANEOUS
  Administered 2020-03-09 – 2020-03-10 (×3): 2 [IU] via SUBCUTANEOUS
  Administered 2020-03-10 – 2020-03-11 (×2): 3 [IU] via SUBCUTANEOUS
  Administered 2020-03-11: 05:00:00 8 [IU] via SUBCUTANEOUS
  Administered 2020-03-11: 17:00:00 3 [IU] via SUBCUTANEOUS
  Administered 2020-03-11: 8 [IU] via SUBCUTANEOUS
  Administered 2020-03-12: 17:00:00 5 [IU] via SUBCUTANEOUS
  Administered 2020-03-12: 05:00:00 11 [IU] via SUBCUTANEOUS

## 2020-02-28 MED ORDER — POTASSIUM CHLORIDE 10 MEQ/100ML IV SOLN
INTRAVENOUS | Status: AC
  Administered 2020-02-28: 10 meq
  Filled 2020-02-28: qty 100

## 2020-02-28 MED ORDER — CALCIUM GLUCONATE-NACL 2-0.675 GM/100ML-% IV SOLN
2.0000 g | Freq: Once | INTRAVENOUS | Status: AC
  Administered 2020-02-28: 03:00:00 2000 mg via INTRAVENOUS
  Filled 2020-02-28: qty 100

## 2020-02-28 MED ORDER — SODIUM CHLORIDE 0.9 % IV SOLN
1.0000 g | INTRAVENOUS | Status: DC
  Administered 2020-02-28 – 2020-03-03 (×5): 1 g via INTRAVENOUS
  Filled 2020-02-28 (×5): qty 10

## 2020-02-28 MED ORDER — ALPRAZOLAM 0.25 MG PO TABS
0.2500 mg | ORAL_TABLET | Freq: Every day | ORAL | Status: DC | PRN
  Administered 2020-03-17 – 2020-03-29 (×4): 0.25 mg via ORAL
  Filled 2020-02-28 (×4): qty 1

## 2020-02-28 MED ORDER — METOCLOPRAMIDE HCL 5 MG/ML IJ SOLN
5.0000 mg | Freq: Once | INTRAMUSCULAR | Status: AC
  Administered 2020-02-28: 5 mg via INTRAVENOUS
  Filled 2020-02-28: qty 2
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
## 2020-02-28 NOTE — Consult Note (Signed)
[facility] Nurse Consult Note: Reason for Consult: foot ulcer Wound type: neuropathic foot ulcer Patient poor historian on timeframe of when appears and length of time present. No family in the room Pressure Injury POA: NA Measurement: 1.0cm x 1.0cm x 0.2cm Wound bed:100% yellow, thin slough Drainage (amount, consistency, odor) minimal, no odor Periwound: intact, foot deformity noted bilaterally  Dressing procedure/placement/frequency: Add enzymatic debridement ointment Cover with dry dressing. Change daily.   Discussed POC with patient and bedside nurse.  Re consult if needed, will not follow at this time. Thanks  Marja Adderley [employer], RN,CWOCN, CNS, [place] [phone])

## 2020-02-28 NOTE — ED Notes (Signed)
RN and NT attempted to obtain lab work multiple times, phlebotomy to obtain remaining orders.
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
## 2020-02-28 NOTE — Plan of Care (Signed)

## 2020-02-28 NOTE — ED Notes (Signed)
Tried to obtain labs unable to get.
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
## 2020-02-28 NOTE — Plan of Care (Signed)
  Problem: Education: Goal: Knowledge of General Education information will improve Description: Including pain rating scale, medication(s)/side effects and non-pharmacologic comfort measures 02/28/2020 1505 by Theotis Barrio, RN Outcome: Progressing 02/28/2020 1445 by Theotis Barrio, RN Outcome: Progressing   Problem: Health Behavior/Discharge Planning: Goal: Ability to manage health-related needs will improve 02/28/2020 1505 by Theotis Barrio, RN Outcome: Progressing 02/28/2020 1445 by Theotis Barrio, RN Outcome: Progressing   Problem: Clinical Measurements: Goal: Ability to maintain clinical measurements within normal limits will improve 02/28/2020 1505 by Theotis Barrio, RN Outcome: Progressing 02/28/2020 1445 by Theotis Barrio, RN Outcome: Progressing Goal: Will remain free from infection 02/28/2020 1505 by Theotis Barrio, RN Outcome: Progressing 02/28/2020 1445 by Theotis Barrio, RN Outcome: Progressing Goal: Diagnostic test results will improve 02/28/2020 1505 by Theotis Barrio, RN Outcome: Progressing 02/28/2020 1445 by Theotis Barrio, RN Outcome: Progressing Goal: Respiratory complications will improve 02/28/2020 1505 by Theotis Barrio, RN Outcome: Progressing 02/28/2020 1445 by Theotis Barrio, RN Outcome: Progressing Goal: Cardiovascular complication will be avoided 02/28/2020 1505 by Theotis Barrio, RN Outcome: Progressing 02/28/2020 1445 by Theotis Barrio, RN Outcome: Progressing   Problem: Activity: Goal: Risk for activity intolerance will decrease 02/28/2020 1505 by Theotis Barrio, RN Outcome: Progressing 02/28/2020 1445 by Theotis Barrio, RN Outcome: Progressing   Problem: Nutrition: Goal: Adequate nutrition will be maintained 02/28/2020 1505 by Theotis Barrio, RN Outcome: Progressing 02/28/2020 1445 by Theotis Barrio, RN Outcome: Progressing   Problem: Coping: Goal: Level of anxiety will decrease 02/28/2020 1505 by Theotis Barrio, RN Outcome:  Progressing 02/28/2020 1445 by Theotis Barrio, RN Outcome: Progressing   Problem: Elimination: Goal: Will not experience complications related to bowel motility 02/28/2020 1505 by Theotis Barrio, RN Outcome: Progressing 02/28/2020 1445 by Theotis Barrio, RN Outcome: Progressing Goal: Will not experience complications related to urinary retention 02/28/2020 1505 by Theotis Barrio, RN Outcome: Progressing 02/28/2020 1445 by Theotis Barrio, RN Outcome: Progressing   Problem: Pain Managment: Goal: General experience of comfort will improve 02/28/2020 1505 by Theotis Barrio, RN Outcome: Progressing 02/28/2020 1445 by Theotis Barrio, RN Outcome: Progressing   Problem: Safety: Goal: Ability to remain free from injury will improve 02/28/2020 1505 by Theotis Barrio, RN Outcome: Progressing 02/28/2020 1445 by Theotis Barrio, RN Outcome: Progressing   Problem: Skin Integrity: Goal: Risk for impaired skin integrity will decrease 02/28/2020 1505 by Theotis Barrio, RN Outcome: Progressing 02/28/2020 1445 by Theotis Barrio, RN Outcome: Progressing

## 2020-02-29 DIAGNOSIS — E1165 Type 2 diabetes mellitus with hyperglycemia: Secondary | ICD-10-CM

## 2020-02-29 DIAGNOSIS — G6289 Other specified polyneuropathies: Secondary | ICD-10-CM

## 2020-02-29 DIAGNOSIS — E1100 Type 2 diabetes mellitus with hyperosmolarity without nonketotic hyperglycemic-hyperosmolar coma (NKHHC): Secondary | ICD-10-CM

## 2020-02-29 LAB — GLUCOSE, CAPILLARY
Glucose-Capillary: 111 mg/dL — ABNORMAL HIGH (ref 70–99)
Glucose-Capillary: 154 mg/dL — ABNORMAL HIGH (ref 70–99)
Glucose-Capillary: 155 mg/dL — ABNORMAL HIGH (ref 70–99)
Glucose-Capillary: 176 mg/dL — ABNORMAL HIGH (ref 70–99)
Glucose-Capillary: 187 mg/dL — ABNORMAL HIGH (ref 70–99)
Glucose-Capillary: 189 mg/dL — ABNORMAL HIGH (ref 70–99)

## 2020-02-29 LAB — CBC WITH DIFFERENTIAL/PLATELET
Abs Immature Granulocytes: 0.06 10*3/uL (ref 0.00–0.07)
Basophils Absolute: 0 10*3/uL (ref 0.0–0.1)
Basophils Relative: 0 %
Eosinophils Absolute: 0 10*3/uL (ref 0.0–0.5)
Eosinophils Relative: 0 %
HCT: 36.8 % (ref 36.0–46.0)
Hemoglobin: 11.9 g/dL — ABNORMAL LOW (ref 12.0–15.0)
Immature Granulocytes: 0 %
Lymphocytes Relative: 25 %
Lymphs Abs: 3.5 10*3/uL (ref 0.7–4.0)
MCH: 23.9 pg — ABNORMAL LOW (ref 26.0–34.0)
MCHC: 32.3 g/dL (ref 30.0–36.0)
MCV: 73.9 fL — ABNORMAL LOW (ref 80.0–100.0)
Monocytes Absolute: 0.6 10*3/uL (ref 0.1–1.0)
Monocytes Relative: 4 %
Neutro Abs: 9.8 10*3/uL — ABNORMAL HIGH (ref 1.7–7.7)
Neutrophils Relative %: 71 %
Platelets: 234 10*3/uL (ref 150–400)
RBC: 4.98 MIL/uL (ref 3.87–5.11)
RDW: 14 % (ref 11.5–15.5)
WBC: 14 10*3/uL — ABNORMAL HIGH (ref 4.0–10.5)
nRBC: 0 % (ref 0.0–0.2)

## 2020-02-29 LAB — URINE CULTURE: Culture: >=100000 — AB

## 2020-02-29 LAB — COMPREHENSIVE METABOLIC PANEL
ALT: 12 U/L (ref 0–44)
AST: 22 U/L (ref 15–41)
Albumin: 2.5 g/dL — ABNORMAL LOW (ref 3.5–5.0)
Alkaline Phosphatase: 97 U/L (ref 38–126)
Anion gap: 16 — ABNORMAL HIGH (ref 5–15)
BUN: 5 mg/dL — ABNORMAL LOW (ref 8–23)
CO2: 28 mmol/L (ref 22–32)
Calcium: 8.4 mg/dL — ABNORMAL LOW (ref 8.9–10.3)
Chloride: 90 mmol/L — ABNORMAL LOW (ref 98–111)
Creatinine, Ser: 0.71 mg/dL (ref 0.44–1.00)
GFR calc Af Amer: >60 mL/min (ref >60)
GFR calc non Af Amer: >60 mL/min (ref >60)
Glucose, Bld: 122 mg/dL — ABNORMAL HIGH (ref 70–99)
Potassium: 2.4 mmol/L — CL (ref 3.5–5.1)
Sodium: 134 mmol/L — ABNORMAL LOW (ref 135–145)
Total Bilirubin: 0.6 mg/dL (ref 0.3–1.2)
Total Protein: 5.7 g/dL — ABNORMAL LOW (ref 6.5–8.1)

## 2020-02-29 LAB — MAGNESIUM: Magnesium: 1.8 mg/dL (ref 1.7–2.4)

## 2020-02-29 LAB — CALCIUM, IONIZED: Calcium, Ionized, Serum: 4.8 mg/dL (ref 4.5–5.6)

## 2020-02-29 MED ORDER — MAGNESIUM SULFATE IN D5W 1-5 GM/100ML-% IV SOLN
1.0000 g | Freq: Once | INTRAVENOUS | Status: AC
  Administered 2020-02-29: 1 g via INTRAVENOUS
  Filled 2020-02-29: qty 100

## 2020-02-29 MED ORDER — MAGNESIUM SULFATE 2 GM/50ML IV SOLN
2.0000 g | Freq: Once | INTRAVENOUS | Status: AC
  Administered 2020-02-29: 10:00:00 2 g via INTRAVENOUS
  Filled 2020-02-29: qty 50

## 2020-02-29 MED ORDER — POTASSIUM CHLORIDE 10 MEQ/100ML IV SOLN
10.0000 meq | INTRAVENOUS | Status: AC
  Administered 2020-02-29 (×5): 10 meq via INTRAVENOUS
  Filled 2020-02-29 (×5): qty 100
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
## 2020-02-29 NOTE — Progress Notes (Signed)
CRITICAL VALUE ALERT  Critical Value:  Potassium 2.4  Date & Time Notied:  02/29/2020 at 0344  Provider Notified: Hospitalist  Orders Received/Actions taken: pending

## 2020-02-29 NOTE — Plan of Care (Signed)
  Problem: Clinical Measurements: Goal: Ability to maintain clinical measurements within normal limits will improve Outcome: Progressing Goal: Will remain free from infection Outcome: Progressing Goal: Respiratory complications will improve Outcome: Progressing Goal: Cardiovascular complication will be avoided Outcome: Progressing   
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
## 2020-03-01 LAB — GLUCOSE, CAPILLARY
Glucose-Capillary: 114 mg/dL — ABNORMAL HIGH (ref 70–99)
Glucose-Capillary: 160 mg/dL — ABNORMAL HIGH (ref 70–99)
Glucose-Capillary: 184 mg/dL — ABNORMAL HIGH (ref 70–99)
Glucose-Capillary: 190 mg/dL — ABNORMAL HIGH (ref 70–99)
Glucose-Capillary: 195 mg/dL — ABNORMAL HIGH (ref 70–99)

## 2020-03-01 LAB — BASIC METABOLIC PANEL
Anion gap: 14 (ref 5–15)
Anion gap: 18 — ABNORMAL HIGH (ref 5–15)
BUN: 5 mg/dL — ABNORMAL LOW (ref 8–23)
BUN: 5 mg/dL — ABNORMAL LOW (ref 8–23)
CO2: 29 mmol/L (ref 22–32)
CO2: 26 mmol/L (ref 22–32)
Calcium: 8.1 mg/dL — ABNORMAL LOW (ref 8.9–10.3)
Calcium: 8.6 mg/dL — ABNORMAL LOW (ref 8.9–10.3)
Chloride: 89 mmol/L — ABNORMAL LOW (ref 98–111)
Chloride: 88 mmol/L — ABNORMAL LOW (ref 98–111)
Creatinine, Ser: 0.67 mg/dL (ref 0.44–1.00)
Creatinine, Ser: 0.74 mg/dL (ref 0.44–1.00)
GFR calc Af Amer: >60 mL/min (ref >60)
GFR calc Af Amer: >60 mL/min (ref >60)
GFR calc non Af Amer: >60 mL/min (ref >60)
GFR calc non Af Amer: >60 mL/min (ref >60)
Glucose, Bld: 121 mg/dL — ABNORMAL HIGH (ref 70–99)
Glucose, Bld: 185 mg/dL — ABNORMAL HIGH (ref 70–99)
Potassium: 2.2 mmol/L — CL (ref 3.5–5.1)
Potassium: 3.4 mmol/L — ABNORMAL LOW (ref 3.5–5.1)
Sodium: 132 mmol/L — ABNORMAL LOW (ref 135–145)
Sodium: 132 mmol/L — ABNORMAL LOW (ref 135–145)

## 2020-03-01 LAB — CBC WITH DIFFERENTIAL/PLATELET
Abs Immature Granulocytes: 0.08 10*3/uL — ABNORMAL HIGH (ref 0.00–0.07)
Basophils Absolute: 0 10*3/uL (ref 0.0–0.1)
Basophils Relative: 0 %
Eosinophils Absolute: 0.1 10*3/uL (ref 0.0–0.5)
Eosinophils Relative: 0 %
HCT: 35.1 % — ABNORMAL LOW (ref 36.0–46.0)
Hemoglobin: 11.4 g/dL — ABNORMAL LOW (ref 12.0–15.0)
Immature Granulocytes: 1 %
Lymphocytes Relative: 23 %
Lymphs Abs: 3.2 10*3/uL (ref 0.7–4.0)
MCH: 23.8 pg — ABNORMAL LOW (ref 26.0–34.0)
MCHC: 32.5 g/dL (ref 30.0–36.0)
MCV: 73.3 fL — ABNORMAL LOW (ref 80.0–100.0)
Monocytes Absolute: 0.7 10*3/uL (ref 0.1–1.0)
Monocytes Relative: 5 %
Neutro Abs: 10 10*3/uL — ABNORMAL HIGH (ref 1.7–7.7)
Neutrophils Relative %: 71 %
Platelets: 237 10*3/uL (ref 150–400)
RBC: 4.79 MIL/uL (ref 3.87–5.11)
RDW: 14 % (ref 11.5–15.5)
WBC: 14.1 10*3/uL — ABNORMAL HIGH (ref 4.0–10.5)
nRBC: 0 % (ref 0.0–0.2)

## 2020-03-01 LAB — MAGNESIUM: Magnesium: 1.7 mg/dL (ref 1.7–2.4)

## 2020-03-01 LAB — PHOSPHORUS: Phosphorus: 2.1 mg/dL — ABNORMAL LOW (ref 2.5–4.6)

## 2020-03-01 MED ORDER — POTASSIUM CHLORIDE 10 MEQ/100ML IV SOLN
10.0000 meq | INTRAVENOUS | Status: AC
  Administered 2020-03-01 (×6): 10 meq via INTRAVENOUS
  Filled 2020-03-01 (×6): qty 100

## 2020-03-01 MED ORDER — POTASSIUM CHLORIDE CRYS ER 20 MEQ PO TBCR
40.0000 meq | EXTENDED_RELEASE_TABLET | Freq: Once | ORAL | Status: AC
  Administered 2020-03-01: 40 meq via ORAL
  Filled 2020-03-01: qty 2

## 2020-03-01 MED ORDER — MAGNESIUM SULFATE 2 GM/50ML IV SOLN
2.0000 g | Freq: Once | INTRAVENOUS | Status: AC
  Administered 2020-03-01: 15:00:00 2 g via INTRAVENOUS
  Filled 2020-03-01: qty 50

## 2020-03-01 MED ORDER — POTASSIUM CHLORIDE 10 MEQ/100ML IV SOLN
10.0000 meq | Freq: Once | INTRAVENOUS | Status: AC
  Administered 2020-03-01: 10 meq via INTRAVENOUS

## 2020-03-01 NOTE — Plan of Care (Signed)
  Problem: Activity: Goal: Risk for activity intolerance will decrease Outcome: Progressing   Problem: Nutrition: Goal: Adequate nutrition will be maintained Outcome: Progressing   Problem: Pain Managment: Goal: General experience of comfort will improve Outcome: Progressing   Problem: Safety: Goal: Ability to remain free from injury will improve Outcome: Progressing   Problem: Skin Integrity: Goal: Risk for impaired skin integrity will decrease Outcome: Progressing   
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
## 2020-03-01 NOTE — Progress Notes (Signed)
CRITICAL VALUE ALERT  Critical Value:  K 2.2  Date & Time Notied:  03/01/20 at 0330  Provider Notified: Tylene Fantasia  Orders Received/Actions taken: Orders obtained for PO and IV K

## 2020-03-02 LAB — BASIC METABOLIC PANEL
Anion gap: 16 — ABNORMAL HIGH (ref 5–15)
Anion gap: 20 — ABNORMAL HIGH (ref 5–15)
BUN: 6 mg/dL — ABNORMAL LOW (ref 8–23)
BUN: 9 mg/dL (ref 8–23)
CO2: 26 mmol/L (ref 22–32)
CO2: 22 mmol/L (ref 22–32)
Calcium: 9 mg/dL (ref 8.9–10.3)
Calcium: 8.9 mg/dL (ref 8.9–10.3)
Chloride: 90 mmol/L — ABNORMAL LOW (ref 98–111)
Chloride: 87 mmol/L — ABNORMAL LOW (ref 98–111)
Creatinine, Ser: 0.67 mg/dL (ref 0.44–1.00)
Creatinine, Ser: 0.78 mg/dL (ref 0.44–1.00)
GFR calc Af Amer: >60 mL/min (ref >60)
GFR calc Af Amer: >60 mL/min (ref >60)
GFR calc non Af Amer: >60 mL/min (ref >60)
GFR calc non Af Amer: >60 mL/min (ref >60)
Glucose, Bld: 161 mg/dL — ABNORMAL HIGH (ref 70–99)
Glucose, Bld: 196 mg/dL — ABNORMAL HIGH (ref 70–99)
Potassium: 3.2 mmol/L — ABNORMAL LOW (ref 3.5–5.1)
Potassium: 3.7 mmol/L (ref 3.5–5.1)
Sodium: 132 mmol/L — ABNORMAL LOW (ref 135–145)
Sodium: 129 mmol/L — ABNORMAL LOW (ref 135–145)

## 2020-03-02 LAB — PHOSPHORUS
Phosphorus: 1.8 mg/dL — ABNORMAL LOW (ref 2.5–4.6)
Phosphorus: 3.5 mg/dL (ref 2.5–4.6)

## 2020-03-02 LAB — GLUCOSE, CAPILLARY
Glucose-Capillary: 159 mg/dL — ABNORMAL HIGH (ref 70–99)
Glucose-Capillary: 133 mg/dL — ABNORMAL HIGH (ref 70–99)
Glucose-Capillary: 112 mg/dL — ABNORMAL HIGH (ref 70–99)
Glucose-Capillary: 185 mg/dL — ABNORMAL HIGH (ref 70–99)
Glucose-Capillary: 238 mg/dL — ABNORMAL HIGH (ref 70–99)

## 2020-03-02 LAB — MAGNESIUM
Magnesium: 1.5 mg/dL — ABNORMAL LOW (ref 1.7–2.4)
Magnesium: 2.4 mg/dL (ref 1.7–2.4)

## 2020-03-02 MED ORDER — MAGNESIUM SULFATE 4 GM/100ML IV SOLN
4.0000 g | Freq: Once | INTRAVENOUS | Status: AC
  Administered 2020-03-02: 4 g via INTRAVENOUS
  Filled 2020-03-02: qty 100

## 2020-03-02 MED ORDER — POTASSIUM PHOSPHATES 15 MMOLE/5ML IV SOLN
30.0000 mmol | Freq: Once | INTRAVENOUS | Status: AC
  Administered 2020-03-02: 30 mmol via INTRAVENOUS
  Filled 2020-03-02: qty 10

## 2020-03-02 MED ORDER — NYSTATIN 100000 UNIT/ML MT SUSP
5.0000 mL | Freq: Four times a day (QID) | OROMUCOSAL | Status: DC
  Administered 2020-03-02 – 2020-03-23 (×62): 500000 [IU] via ORAL
  Filled 2020-03-02 (×68): qty 5

## 2020-03-02 MED ORDER — MAGIC MOUTHWASH W/LIDOCAINE
5.0000 mL | Freq: Four times a day (QID) | ORAL | Status: DC
  Administered 2020-03-02 – 2020-03-11 (×20): 5 mL via ORAL
  Filled 2020-03-02 (×44): qty 5

## 2020-03-02 MED ORDER — POTASSIUM CHLORIDE 10 MEQ/100ML IV SOLN
10.0000 meq | Freq: Once | INTRAVENOUS | Status: AC
  Administered 2020-03-02: 12:00:00 10 meq via INTRAVENOUS
  Filled 2020-03-02: qty 100

## 2020-03-02 NOTE — Progress Notes (Addendum)
RN attempted to have patient urinate on [facility] without any results. Pt complains of stomach pain. Pt has purewick with only approximately 300 during shift. RN bladder scanned pt showing 622 mL. RN proceeded to in and out cath pt. 800 mL retrieved. Paged MD to make aware.  RN will continue to monitor.

## 2020-03-02 NOTE — Plan of Care (Signed)
  Problem: Nutrition: Goal: Adequate nutrition will be maintained Outcome: Progressing   Problem: Coping: Goal: Level of anxiety will decrease Outcome: Progressing   Problem: Elimination: Goal: Will not experience complications related to urinary retention Outcome: Progressing   Problem: Pain Managment: Goal: General experience of comfort will improve Outcome: Progressing   Problem: Safety: Goal: Ability to remain free from injury will improve Outcome: Progressing   
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
## 2020-03-03 ENCOUNTER — Inpatient Hospital Stay (HOSPITAL_COMMUNITY): Payer: Medicare Other

## 2020-03-03 LAB — CULTURE, BLOOD (ROUTINE X 2)
Culture: NO GROWTH
Culture: NO GROWTH

## 2020-03-03 LAB — CBC WITH DIFFERENTIAL/PLATELET
Abs Immature Granulocytes: 0.08 10*3/uL — ABNORMAL HIGH (ref 0.00–0.07)
Basophils Absolute: 0 10*3/uL (ref 0.0–0.1)
Basophils Relative: 0 %
Eosinophils Absolute: 0 10*3/uL (ref 0.0–0.5)
Eosinophils Relative: 0 %
HCT: 38.4 % (ref 36.0–46.0)
Hemoglobin: 12.7 g/dL (ref 12.0–15.0)
Immature Granulocytes: 1 %
Lymphocytes Relative: 20 %
Lymphs Abs: 3 10*3/uL (ref 0.7–4.0)
MCH: 23.7 pg — ABNORMAL LOW (ref 26.0–34.0)
MCHC: 33.1 g/dL (ref 30.0–36.0)
MCV: 71.8 fL — ABNORMAL LOW (ref 80.0–100.0)
Monocytes Absolute: 0.7 10*3/uL (ref 0.1–1.0)
Monocytes Relative: 5 %
Neutro Abs: 11.3 10*3/uL — ABNORMAL HIGH (ref 1.7–7.7)
Neutrophils Relative %: 74 %
Platelets: 284 10*3/uL (ref 150–400)
RBC: 5.35 MIL/uL — ABNORMAL HIGH (ref 3.87–5.11)
RDW: 14 % (ref 11.5–15.5)
WBC: 15.1 10*3/uL — ABNORMAL HIGH (ref 4.0–10.5)
nRBC: 0 % (ref 0.0–0.2)

## 2020-03-03 LAB — GLUCOSE, CAPILLARY
Glucose-Capillary: 123 mg/dL — ABNORMAL HIGH (ref 70–99)
Glucose-Capillary: 124 mg/dL — ABNORMAL HIGH (ref 70–99)
Glucose-Capillary: 127 mg/dL — ABNORMAL HIGH (ref 70–99)
Glucose-Capillary: 130 mg/dL — ABNORMAL HIGH (ref 70–99)
Glucose-Capillary: 132 mg/dL — ABNORMAL HIGH (ref 70–99)
Glucose-Capillary: 166 mg/dL — ABNORMAL HIGH (ref 70–99)
Glucose-Capillary: 177 mg/dL — ABNORMAL HIGH (ref 70–99)

## 2020-03-03 LAB — BASIC METABOLIC PANEL
Anion gap: 15 (ref 5–15)
BUN: 10 mg/dL (ref 8–23)
CO2: 24 mmol/L (ref 22–32)
Calcium: 8.6 mg/dL — ABNORMAL LOW (ref 8.9–10.3)
Chloride: 88 mmol/L — ABNORMAL LOW (ref 98–111)
Creatinine, Ser: 0.74 mg/dL (ref 0.44–1.00)
GFR calc Af Amer: >60 mL/min (ref >60)
GFR calc non Af Amer: >60 mL/min (ref >60)
Glucose, Bld: 190 mg/dL — ABNORMAL HIGH (ref 70–99)
Potassium: 3.5 mmol/L (ref 3.5–5.1)
Sodium: 127 mmol/L — ABNORMAL LOW (ref 135–145)

## 2020-03-03 LAB — PHOSPHORUS: Phosphorus: 3.1 mg/dL (ref 2.5–4.6)

## 2020-03-03 LAB — MAGNESIUM: Magnesium: 1.9 mg/dL (ref 1.7–2.4)

## 2020-03-03 MED ORDER — SODIUM CHLORIDE 1 G PO TABS
2.0000 g | ORAL_TABLET | Freq: Two times a day (BID) | ORAL | Status: DC
  Administered 2020-03-03 – 2020-03-29 (×47): 2 g via ORAL
  Filled 2020-03-03 (×58): qty 2
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
## 2020-03-03 NOTE — Plan of Care (Signed)
  Problem: Coping: Goal: Level of anxiety will decrease Outcome: Progressing   Problem: Elimination: Goal: Will not experience complications related to urinary retention Outcome: Progressing   Problem: Pain Managment: Goal: General experience of comfort will improve Outcome: Progressing   

## 2020-03-04 DIAGNOSIS — R1310 Dysphagia, unspecified: Secondary | ICD-10-CM

## 2020-03-04 DIAGNOSIS — K920 Hematemesis: Secondary | ICD-10-CM

## 2020-03-04 LAB — CBC WITH DIFFERENTIAL/PLATELET
Abs Immature Granulocytes: 0.05 10*3/uL (ref 0.00–0.07)
Basophils Absolute: 0 10*3/uL (ref 0.0–0.1)
Basophils Relative: 0 %
Eosinophils Absolute: 0 10*3/uL (ref 0.0–0.5)
Eosinophils Relative: 0 %
HCT: 37.6 % (ref 36.0–46.0)
Hemoglobin: 12.4 g/dL (ref 12.0–15.0)
Immature Granulocytes: 0 %
Lymphocytes Relative: 30 %
Lymphs Abs: 3.4 10*3/uL (ref 0.7–4.0)
MCH: 23.8 pg — ABNORMAL LOW (ref 26.0–34.0)
MCHC: 33 g/dL (ref 30.0–36.0)
MCV: 72 fL — ABNORMAL LOW (ref 80.0–100.0)
Monocytes Absolute: 0.8 10*3/uL (ref 0.1–1.0)
Monocytes Relative: 7 %
Neutro Abs: 7.1 10*3/uL (ref 1.7–7.7)
Neutrophils Relative %: 63 %
Platelets: 274 10*3/uL (ref 150–400)
RBC: 5.22 MIL/uL — ABNORMAL HIGH (ref 3.87–5.11)
RDW: 14 % (ref 11.5–15.5)
WBC: 11.3 10*3/uL — ABNORMAL HIGH (ref 4.0–10.5)
nRBC: 0.2 % (ref 0.0–0.2)

## 2020-03-04 LAB — GLUCOSE, CAPILLARY
Glucose-Capillary: 105 mg/dL — ABNORMAL HIGH (ref 70–99)
Glucose-Capillary: 105 mg/dL — ABNORMAL HIGH (ref 70–99)
Glucose-Capillary: 135 mg/dL — ABNORMAL HIGH (ref 70–99)
Glucose-Capillary: 125 mg/dL — ABNORMAL HIGH (ref 70–99)
Glucose-Capillary: 123 mg/dL — ABNORMAL HIGH (ref 70–99)

## 2020-03-04 LAB — BASIC METABOLIC PANEL
Anion gap: 13 (ref 5–15)
BUN: 9 mg/dL (ref 8–23)
CO2: 28 mmol/L (ref 22–32)
Calcium: 8.9 mg/dL (ref 8.9–10.3)
Chloride: 87 mmol/L — ABNORMAL LOW (ref 98–111)
Creatinine, Ser: 0.71 mg/dL (ref 0.44–1.00)
GFR calc Af Amer: >60 mL/min (ref >60)
GFR calc non Af Amer: >60 mL/min (ref >60)
Glucose, Bld: 107 mg/dL — ABNORMAL HIGH (ref 70–99)
Potassium: 3.4 mmol/L — ABNORMAL LOW (ref 3.5–5.1)
Sodium: 128 mmol/L — ABNORMAL LOW (ref 135–145)

## 2020-03-04 MED ORDER — PROCHLORPERAZINE EDISYLATE 10 MG/2ML IJ SOLN
10.0000 mg | INTRAMUSCULAR | Status: DC | PRN
  Administered 2020-03-04 – 2020-03-05 (×2): 10 mg via INTRAVENOUS
  Filled 2020-03-04 (×3): qty 2

## 2020-03-04 MED ORDER — FUROSEMIDE 20 MG PO TABS
20.0000 mg | ORAL_TABLET | Freq: Two times a day (BID) | ORAL | Status: DC
  Administered 2020-03-06: 20 mg via ORAL
  Filled 2020-03-04 (×3): qty 1

## 2020-03-04 MED ORDER — POTASSIUM CHLORIDE CRYS ER 20 MEQ PO TBCR
40.0000 meq | EXTENDED_RELEASE_TABLET | Freq: Once | ORAL | Status: DC
  Filled 2020-03-04: qty 2

## 2020-03-04 NOTE — H&P (View-Only) (Signed)
Consultation  Referring Provider:     Karie Kirks Primary Care Physician:  Tresa Garter, MD Primary Gastroenterologist:    Althia Forts     Reason for Consultation:     dysphagia         HPI:   Yvonne [name] is a 74 y.o. female with a history of dementia, diabetes, COPD, CHF, who presented to the hospital on the fourth with altered mental status, high blood sugars, and complaints of hematemesis.  Her daughter-in-law found her at home vomiting some blood.  She was found to have a lactic acidosis and electrolyte abnormalities putting hyponatremia and hypokalemia.  Patient was not thought to have been taking her insulin.  Primary team suspect she developed DKA which led to vomiting and hematemesis.  She was also thought to have a UTI along with a diabetic foot ulcer with cellulitis.  He has been on antibiotics. she has had no further vomiting of blood since she has been here.  Of note the patient has not been eating well since she is been here.  She complains of dysphagia and frequently regurgitates her food.  Patient has limited history regarding this.  I called her daughter-in-law [name] who provides most of this history.  She states she is had a very difficult time taking her medications due to this problem.  Patient reports ongoing nausea vomiting.  Hemoglobin relatively stable at 12's.  BUN has been normal.  Barium swallow performed which showed delayed passage of contrast at the GE J and distal esophagus with a subtle luminal irregularity, radiology concerned about pseudoachalasia and need to rule out GE J neoplasm.  She has never had a prior endoscopy.  He takes omeprazole at home for reflux.  He had a CT chest in 2019 which not show any significant abnormalities.  Past Medical History:  Diagnosis Date  . Asthma   . Blood transfusion   . Cervicalgia   . CHF (congestive heart failure) ([place])   . Chronic pain syndrome   . COPD (chronic obstructive pulmonary disease) ([place])   .  Diabetes mellitus   . Disturbance of skin sensation   . Hyperlipidemia   . Hypertension   . Lumbago   . Olecranon bursitis   . Pain in joint, hand   . Polyneuropathy in diabetes(357.2)     Past Surgical History:  Procedure Laterality Date  . ABDOMINAL HYSTERECTOMY    . BACK SURGERY     from bacteria    History reviewed. No pertinent family history.  Unable to obtain from patient  Social History   Tobacco Use  . Smoking status: Current Some Day Smoker    Packs/day: 0.50    Years: 30.00    Pack years: 15.00    Types: Cigarettes  . Smokeless tobacco: Never Used  . Tobacco comment: was dx with COPD  Substance Use Topics  . Alcohol use: No  . Drug use: No    Prior to Admission medications   Medication Sig Start Date End Date Taking? Authorizing Provider  ALPRAZolam Duanne Moron) 0.25 MG tablet Take 0.25 mg by mouth daily as needed for anxiety.  08/27/16  Yes [provider]  BELSOMRA 20 MG TABS Take 20 mg by mouth at bedtime as needed (sleep).  08/27/16  Yes [provider]  DULoxetine (CYMBALTA) 60 MG capsule Take 60 mg by mouth daily. 02/07/20  Yes [provider]  gabapentin (NEURONTIN) 400 MG capsule TAKE 1 CAPSULE (400 MG TOTAL) BY MOUTH 4 (  FOUR) TIMES DAILY. If your foot pain is severe, you can take one extra tablet in the afternoon Patient taking differently: Take 400 mg by mouth 4 (four) times daily.  02/21/15  Yes Jegede, Olugbemiga E, MD  insulin glargine (LANTUS) 100 UNIT/ML injection Inject 0.25 mLs (25 Units total) into the skin at bedtime. Patient taking differently: Inject 15 Units into the skin at bedtime.  07/20/15  Yes Jegede, Marlena Clipper, MD  JARDIANCE 25 MG TABS tablet Take 25 mg by mouth daily. 09/19/19  Yes [provider]  metFORMIN (GLUCOPHAGE) 1000 MG tablet Take 1 tablet (1,000 mg total) by mouth 2 (two) times daily with a meal. 02/21/15  Yes Jegede, Olugbemiga E, MD  omeprazole (PRILOSEC) 40 MG capsule Take 40 mg by mouth  daily.   Yes [provider]  pravastatin (PRAVACHOL) 20 MG tablet Take 1 tablet (20 mg total) by mouth daily. 02/21/15  Yes Tresa Garter, MD  acetaminophen-codeine (TYLENOL #3) 300-30 MG per tablet Take 1 tablet by mouth every 8 (eight) hours as needed for moderate pain. Patient not taking: Reported on 02/28/2020 02/21/15   Tresa Garter, MD  albuterol (PROVENTIL HFA;VENTOLIN HFA) 108 (90 BASE) MCG/ACT inhaler Inhale 2 puffs into the lungs every 6 (six) hours as needed for wheezing. Patient not taking: Reported on 02/28/2020 07/20/15 11/19/16  Tresa Garter, MD  glucose monitoring kit (FREESTYLE) monitoring kit 1 each by Does not apply route as needed for other. Test blood sugar 3 times daily 06/23/14   Tresa Garter, MD  ranitidine (ZANTAC) 150 MG capsule Take 1 capsule (150 mg total) by mouth 2 (two) times daily. Patient not taking: Reported on 02/28/2020 02/21/15   Tresa Garter, MD    Current Facility-Administered Medications  Medication Dose Route Frequency Provider Last Rate Last Admin  . ALPRAZolam (XANAX) tablet 0.25 mg  0.25 mg Oral Daily PRN Swayze, Ava, DO      . collagenase (SANTYL) ointment   Topical Daily Swayze, Ava, DO   Given at 03/04/20 1000  . DULoxetine (CYMBALTA) DR capsule 30 mg  30 mg Oral Daily Gilles Chiquito B, MD   30 mg at 03/03/20 0947  . furosemide (LASIX) tablet 20 mg  20 mg Oral BID Swayze, Ava, DO      . gabapentin (NEURONTIN) capsule 100 mg  100 mg Oral TID Swayze, Ava, DO   100 mg at 03/03/20 2136  . insulin aspart (novoLOG) injection 0-15 Units  0-15 Units Subcutaneous Q4H Swayze, Ava, DO   2 Units at 03/04/20 2100  . magic mouthwash w/lidocaine  5 mL Oral QID Swayze, Ava, DO   5 mL at 03/03/20 2137  . nystatin (MYCOSTATIN) 100000 UNIT/ML suspension 500,000 Units  5 mL Oral QID Swayze, Ava, DO   500,000 Units at 03/03/20 2136  . ondansetron (ZOFRAN) injection 4 mg  4 mg Intravenous Q6H PRN Swayze, Ava, DO   4 mg at 03/04/20  1552  . potassium chloride SA (KLOR-CON) CR tablet 40 mEq  40 mEq Oral Once Swayze, Ava, DO      . prochlorperazine (COMPAZINE) injection 10 mg  10 mg Intravenous Q4H PRN Swayze, Ava, DO   10 mg at 03/04/20 1812  . sodium chloride tablet 2 g  2 g Oral BID WC Swayze, Ava, DO   2 g at 03/04/20 0630    Allergies as of 02/27/2020  . (No Known Allergies)     Review of Systems:    As per HPI, otherwise  negative    Physical Exam:  Vital signs in last 24 hours: Temp:  [97.8 F (36.6 C)-98.8 F (37.1 C)] 98.8 F (37.1 C) (04/10 2031) Pulse Rate:  [87-100] 87 (04/10 2031) Resp:  [11-19] 19 (04/10 2031) BP: (93-155)/(66-92) 126/66 (04/10 2031) SpO2:  [92 %-99 %] 92 % (04/10 2031) Weight:  [81.8 kg] 81.8 kg (04/10 0432)   General:   Pleasant female in NAD - responds appropriately Neck:  Supple Lungs:  Respirations even and unlabored.  Heart:  Regular rate Abdomen:  Soft, protuberant, nontender.  Rectal:  Not performed.  Neurologic:  Alert and  Oriented, not able to provide much history Psych:  Alert and cooperative. Normal affect.  LAB RESULTS: Recent Labs    03/03/20 0157 03/04/20 0234  WBC 15.1* 11.3*  HGB 12.7 12.4  HCT 38.4 37.6  PLT 284 274   BMET Recent Labs    03/02/20 1804 03/03/20 0157 03/04/20 0234  NA 129* 127* 128*  K 3.7 3.5 3.4*  CL 87* 88* 87*  CO2 22 24 28   GLUCOSE 196* 190* 107*  BUN 9 10 9   CREATININE 0.78 0.74 0.71  CALCIUM 8.9 8.6* 8.9   LFT No results for input(s): PROT, ALBUMIN, AST, ALT, ALKPHOS, BILITOT, BILIDIR, IBILI in the last 72 hours. PT/INR No results for input(s): LABPROT, INR in the last 72 hours.  STUDIES: DG ESOPHAGUS W SINGLE CM (SOL OR THIN BA)  Result Date: 03/03/2020 CLINICAL DATA:  Possible achalasia.  Vomiting. EXAM: ESOPHOGRAM/BARIUM SWALLOW TECHNIQUE: Single contrast examination was performed using  thin barium. FLUOROSCOPY TIME:  Fluoroscopy Time:  2 minutes and 53 seconds Radiation Exposure Index (if provided by the  fluoroscopic device): Not applicable. Number of Acquired Spot Images: 0 COMPARISON:  Report of chest abdomen and pelvic CT of 01/09/2018. FINDINGS: Focused, single-contrast exam performed with patient in LPO position. Mild limitations secondary to patient clinical and mental status. An individual esophageal swallow demonstrates an incomplete primary peristaltic wave with stasis throughout the esophagus. There is delayed passage of contrast within the lower esophagus and at the level of the gastroesophageal junction. With upright positioning, eventual passage of contrast, with luminal irregularity suggested including on the best distended images (655/7). IMPRESSION: 1. Mild limitations as detailed above. 2. Absent normal primary peristaltic wave. Delayed passage of contrast at the gastroesophageal junction and distal esophagus with an area of subtle luminal irregularity. Findings suspicious for "pseudoachalasia" secondary to an underlying gastroesophageal junction primary. A somewhat atypical appearance (without normal beaking of the esophageal contrast column) of achalasia is felt less likely. Consider endoscopy. Electronically Signed   By: Abigail Miyamoto M.D.   On: 03/03/2020 14:52     Impression / Plan:   74 year old female found at home with UTI and DKA, with nausea vomiting and development of hematemesis.  No significant drop in hemoglobin and BUN has been normal, she has not had any significant GI bleeding, quite possible she had a Mallory-Weiss tear in the setting of vomiting when in DKA.  However, she does have what sounds like severe dysphagia.  Barium swallow today raises a question of pseudoachalasia and potential lesion at the [place].  She warrants an upper endoscopy to further evaluate this abnormality, ensure no malignancy, clarify cause of dysphagia, dilate if amenable.  I discussed EGD with the patient and risks and benefits.  She is a bit anxious about it and unclear if she wants to proceed.  I  called her point of contact and healthcare proxy [name] and we  spoke for several minutes tonight.  She strongly wishes the patient to have this exam done and will talk with the patient in the morning to see if they can get the same page.  If the patient is willing to proceed, we will plan for this tomorrow.  I will keep her n.p.o. after midnight.  Further recommendations pending results and her course.    [place] Cellar, MD [facility] Gastroenter[facility]

## 2020-03-04 NOTE — Plan of Care (Signed)

## 2020-03-04 NOTE — Consult Note (Signed)
Consultation  Referring Provider:     Karie Kirks Primary Care Physician:  Tresa Garter, MD Primary Gastroenterologist:    Althia Forts     Reason for Consultation:     dysphagia         HPI:   Yvonne [name] is a 74 y.o. female with a history of dementia, diabetes, COPD, CHF, who presented to the hospital on the fourth with altered mental status, high blood sugars, and complaints of hematemesis.  Her daughter-in-law found her at home vomiting some blood.  She was found to have a lactic acidosis and electrolyte abnormalities putting hyponatremia and hypokalemia.  Patient was not thought to have been taking her insulin.  Primary team suspect she developed DKA which led to vomiting and hematemesis.  She was also thought to have a UTI along with a diabetic foot ulcer with cellulitis.  He has been on antibiotics. she has had no further vomiting of blood since she has been here.  Of note the patient has not been eating well since she is been here.  She complains of dysphagia and frequently regurgitates her food.  Patient has limited history regarding this.  I called her daughter-in-law [name] who provides most of this history.  She states she is had a very difficult time taking her medications due to this problem.  Patient reports ongoing nausea vomiting.  Hemoglobin relatively stable at 12's.  BUN has been normal.  Barium swallow performed which showed delayed passage of contrast at the GE J and distal esophagus with a subtle luminal irregularity, radiology concerned about pseudoachalasia and need to rule out GE J neoplasm.  She has never had a prior endoscopy.  He takes omeprazole at home for reflux.  He had a CT chest in 2019 which not show any significant abnormalities.  Past Medical History:  Diagnosis Date  . Asthma   . Blood transfusion   . Cervicalgia   . CHF (congestive heart failure) ([place])   . Chronic pain syndrome   . COPD (chronic obstructive pulmonary disease) ([place])   .  Diabetes mellitus   . Disturbance of skin sensation   . Hyperlipidemia   . Hypertension   . Lumbago   . Olecranon bursitis   . Pain in joint, hand   . Polyneuropathy in diabetes(357.2)     Past Surgical History:  Procedure Laterality Date  . ABDOMINAL HYSTERECTOMY    . BACK SURGERY     from bacteria    History reviewed. No pertinent family history.  Unable to obtain from patient  Social History   Tobacco Use  . Smoking status: Current Some Day Smoker    Packs/day: 0.50    Years: 30.00    Pack years: 15.00    Types: Cigarettes  . Smokeless tobacco: Never Used  . Tobacco comment: was dx with COPD  Substance Use Topics  . Alcohol use: No  . Drug use: No    Prior to Admission medications   Medication Sig Start Date End Date Taking? Authorizing Provider  ALPRAZolam Duanne Moron) 0.25 MG tablet Take 0.25 mg by mouth daily as needed for anxiety.  08/27/16  Yes [provider]  BELSOMRA 20 MG TABS Take 20 mg by mouth at bedtime as needed (sleep).  08/27/16  Yes [provider]  DULoxetine (CYMBALTA) 60 MG capsule Take 60 mg by mouth daily. 02/07/20  Yes [provider]  gabapentin (NEURONTIN) 400 MG capsule TAKE 1 CAPSULE (400 MG TOTAL) BY MOUTH 4 (  FOUR) TIMES DAILY. If your foot pain is severe, you can take one extra tablet in the afternoon Patient taking differently: Take 400 mg by mouth 4 (four) times daily.  02/21/15  Yes Jegede, Olugbemiga E, MD  insulin glargine (LANTUS) 100 UNIT/ML injection Inject 0.25 mLs (25 Units total) into the skin at bedtime. Patient taking differently: Inject 15 Units into the skin at bedtime.  07/20/15  Yes Jegede, Marlena Clipper, MD  JARDIANCE 25 MG TABS tablet Take 25 mg by mouth daily. 09/19/19  Yes [provider]  metFORMIN (GLUCOPHAGE) 1000 MG tablet Take 1 tablet (1,000 mg total) by mouth 2 (two) times daily with a meal. 02/21/15  Yes Jegede, Olugbemiga E, MD  omeprazole (PRILOSEC) 40 MG capsule Take 40 mg by mouth  daily.   Yes [provider]  pravastatin (PRAVACHOL) 20 MG tablet Take 1 tablet (20 mg total) by mouth daily. 02/21/15  Yes Tresa Garter, MD  acetaminophen-codeine (TYLENOL #3) 300-30 MG per tablet Take 1 tablet by mouth every 8 (eight) hours as needed for moderate pain. Patient not taking: Reported on 02/28/2020 02/21/15   Tresa Garter, MD  albuterol (PROVENTIL HFA;VENTOLIN HFA) 108 (90 BASE) MCG/ACT inhaler Inhale 2 puffs into the lungs every 6 (six) hours as needed for wheezing. Patient not taking: Reported on 02/28/2020 07/20/15 11/19/16  Tresa Garter, MD  glucose monitoring kit (FREESTYLE) monitoring kit 1 each by Does not apply route as needed for other. Test blood sugar 3 times daily 06/23/14   Tresa Garter, MD  ranitidine (ZANTAC) 150 MG capsule Take 1 capsule (150 mg total) by mouth 2 (two) times daily. Patient not taking: Reported on 02/28/2020 02/21/15   Tresa Garter, MD    Current Facility-Administered Medications  Medication Dose Route Frequency Provider Last Rate Last Admin  . ALPRAZolam (XANAX) tablet 0.25 mg  0.25 mg Oral Daily PRN Swayze, Ava, DO      . collagenase (SANTYL) ointment   Topical Daily Swayze, Ava, DO   Given at 03/04/20 1000  . DULoxetine (CYMBALTA) DR capsule 30 mg  30 mg Oral Daily Gilles Chiquito B, MD   30 mg at 03/03/20 0947  . furosemide (LASIX) tablet 20 mg  20 mg Oral BID Swayze, Ava, DO      . gabapentin (NEURONTIN) capsule 100 mg  100 mg Oral TID Swayze, Ava, DO   100 mg at 03/03/20 2136  . insulin aspart (novoLOG) injection 0-15 Units  0-15 Units Subcutaneous Q4H Swayze, Ava, DO   2 Units at 03/04/20 2100  . magic mouthwash w/lidocaine  5 mL Oral QID Swayze, Ava, DO   5 mL at 03/03/20 2137  . nystatin (MYCOSTATIN) 100000 UNIT/ML suspension 500,000 Units  5 mL Oral QID Swayze, Ava, DO   500,000 Units at 03/03/20 2136  . ondansetron (ZOFRAN) injection 4 mg  4 mg Intravenous Q6H PRN Swayze, Ava, DO   4 mg at 03/04/20  1552  . potassium chloride SA (KLOR-CON) CR tablet 40 mEq  40 mEq Oral Once Swayze, Ava, DO      . prochlorperazine (COMPAZINE) injection 10 mg  10 mg Intravenous Q4H PRN Swayze, Ava, DO   10 mg at 03/04/20 1812  . sodium chloride tablet 2 g  2 g Oral BID WC Swayze, Ava, DO   2 g at 03/04/20 0630    Allergies as of 02/27/2020  . (No Known Allergies)     Review of Systems:    As per HPI, otherwise  negative    Physical Exam:  Vital signs in last 24 hours: Temp:  [97.8 F (36.6 C)-98.8 F (37.1 C)] 98.8 F (37.1 C) (04/10 2031) Pulse Rate:  [87-100] 87 (04/10 2031) Resp:  [11-19] 19 (04/10 2031) BP: (93-155)/(66-92) 126/66 (04/10 2031) SpO2:  [92 %-99 %] 92 % (04/10 2031) Weight:  [81.8 kg] 81.8 kg (04/10 0432)   General:   Pleasant female in NAD - responds appropriately Neck:  Supple Lungs:  Respirations even and unlabored.  Heart:  Regular rate Abdomen:  Soft, protuberant, nontender.  Rectal:  Not performed.  Neurologic:  Alert and  Oriented, not able to provide much history Psych:  Alert and cooperative. Normal affect.  LAB RESULTS: Recent Labs    03/03/20 0157 03/04/20 0234  WBC 15.1* 11.3*  HGB 12.7 12.4  HCT 38.4 37.6  PLT 284 274   BMET Recent Labs    03/02/20 1804 03/03/20 0157 03/04/20 0234  NA 129* 127* 128*  K 3.7 3.5 3.4*  CL 87* 88* 87*  CO2 22 24 28   GLUCOSE 196* 190* 107*  BUN 9 10 9   CREATININE 0.78 0.74 0.71  CALCIUM 8.9 8.6* 8.9   LFT No results for input(s): PROT, ALBUMIN, AST, ALT, ALKPHOS, BILITOT, BILIDIR, IBILI in the last 72 hours. PT/INR No results for input(s): LABPROT, INR in the last 72 hours.  STUDIES: DG ESOPHAGUS W SINGLE CM (SOL OR THIN BA)  Result Date: 03/03/2020 CLINICAL DATA:  Possible achalasia.  Vomiting. EXAM: ESOPHOGRAM/BARIUM SWALLOW TECHNIQUE: Single contrast examination was performed using  thin barium. FLUOROSCOPY TIME:  Fluoroscopy Time:  2 minutes and 53 seconds Radiation Exposure Index (if provided by the  fluoroscopic device): Not applicable. Number of Acquired Spot Images: 0 COMPARISON:  Report of chest abdomen and pelvic CT of 01/09/2018. FINDINGS: Focused, single-contrast exam performed with patient in LPO position. Mild limitations secondary to patient clinical and mental status. An individual esophageal swallow demonstrates an incomplete primary peristaltic wave with stasis throughout the esophagus. There is delayed passage of contrast within the lower esophagus and at the level of the gastroesophageal junction. With upright positioning, eventual passage of contrast, with luminal irregularity suggested including on the best distended images (655/7). IMPRESSION: 1. Mild limitations as detailed above. 2. Absent normal primary peristaltic wave. Delayed passage of contrast at the gastroesophageal junction and distal esophagus with an area of subtle luminal irregularity. Findings suspicious for "pseudoachalasia" secondary to an underlying gastroesophageal junction primary. A somewhat atypical appearance (without normal beaking of the esophageal contrast column) of achalasia is felt less likely. Consider endoscopy. Electronically Signed   By: Abigail Miyamoto M.D.   On: 03/03/2020 14:52     Impression / Plan:   74 year old female found at home with UTI and DKA, with nausea vomiting and development of hematemesis.  No significant drop in hemoglobin and BUN has been normal, she has not had any significant GI bleeding, quite possible she had a Mallory-Weiss tear in the setting of vomiting when in DKA.  However, she does have what sounds like severe dysphagia.  Barium swallow today raises a question of pseudoachalasia and potential lesion at the [place].  She warrants an upper endoscopy to further evaluate this abnormality, ensure no malignancy, clarify cause of dysphagia, dilate if amenable.  I discussed EGD with the patient and risks and benefits.  She is a bit anxious about it and unclear if she wants to proceed.  I  called her point of contact and healthcare proxy [name] and we  spoke for several minutes tonight.  She strongly wishes the patient to have this exam done and will talk with the patient in the morning to see if they can get the same page.  If the patient is willing to proceed, we will plan for this tomorrow.  I will keep her n.p.o. after midnight.  Further recommendations pending results and her course.    [place] Cellar, MD [facility] Gastroenterology

## 2020-03-04 NOTE — Progress Notes (Addendum)
Sent page to doctor (Swayze)---patient not getting much relief from Zofran, refused to eat or take any pills by mouth all day---will only sip on liquid--nauseated off and on, no vomiting---Combazine prn has been added to patient's med list for additional support with nausea, patient states she does not want anything else for nausea right now, she wants to rest, just not hungry
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
## 2020-03-05 ENCOUNTER — Encounter (HOSPITAL_COMMUNITY): Payer: Self-pay | Admitting: Internal Medicine

## 2020-03-05 ENCOUNTER — Encounter (HOSPITAL_COMMUNITY)
Admission: EM | Disposition: A | Discharge status: Skilled Nursing Facility | Payer: Self-pay | Source: Home / Self Care | Attending: Internal Medicine

## 2020-03-05 ENCOUNTER — Inpatient Hospital Stay (HOSPITAL_COMMUNITY): Payer: Medicare Other | Admitting: Certified Registered Nurse Anesthetist

## 2020-03-05 HISTORY — PX: ESOPHAGOGASTRODUODENOSCOPY (EGD) WITH PROPOFOL: SHX5813

## 2020-03-05 HISTORY — PX: BIOPSY: SHX5522

## 2020-03-05 LAB — BASIC METABOLIC PANEL
Anion gap: 15 (ref 5–15)
BUN: 10 mg/dL (ref 8–23)
CO2: 22 mmol/L (ref 22–32)
Calcium: 8.8 mg/dL — ABNORMAL LOW (ref 8.9–10.3)
Chloride: 92 mmol/L — ABNORMAL LOW (ref 98–111)
Creatinine, Ser: 0.67 mg/dL (ref 0.44–1.00)
GFR calc Af Amer: >60 mL/min (ref >60)
GFR calc non Af Amer: >60 mL/min (ref >60)
Glucose, Bld: 79 mg/dL (ref 70–99)
Potassium: 2.8 mmol/L — ABNORMAL LOW (ref 3.5–5.1)
Sodium: 129 mmol/L — ABNORMAL LOW (ref 135–145)

## 2020-03-05 LAB — GLUCOSE, CAPILLARY
Glucose-Capillary: 103 mg/dL — ABNORMAL HIGH (ref 70–99)
Glucose-Capillary: 105 mg/dL — ABNORMAL HIGH (ref 70–99)
Glucose-Capillary: 147 mg/dL — ABNORMAL HIGH (ref 70–99)
Glucose-Capillary: 89 mg/dL (ref 70–99)
Glucose-Capillary: 93 mg/dL (ref 70–99)
Glucose-Capillary: 96 mg/dL (ref 70–99)

## 2020-03-05 SURGERY — ESOPHAGOGASTRODUODENOSCOPY (EGD) WITH PROPOFOL
Anesthesia: Monitor Anesthesia Care

## 2020-03-05 MED ORDER — LACTATED RINGERS IV SOLN: INTRAVENOUS | Status: DC | PRN

## 2020-03-05 MED ORDER — SUCRALFATE 1 GM/10ML PO SUSP
1.0000 g | Freq: Four times a day (QID) | ORAL | Status: DC
  Administered 2020-03-06 – 2020-03-29 (×86): 1 g via ORAL
  Filled 2020-03-05 (×91): qty 10

## 2020-03-05 MED ORDER — POTASSIUM CHLORIDE 10 MEQ/100ML IV SOLN
10.0000 meq | INTRAVENOUS | Status: AC
  Administered 2020-03-05 (×4): 10 meq via INTRAVENOUS
  Filled 2020-03-05 (×3): qty 100

## 2020-03-05 MED ORDER — PROPOFOL 10 MG/ML IV BOLUS
INTRAVENOUS | Status: DC | PRN
  Administered 2020-03-05: 10 mg via INTRAVENOUS
  Administered 2020-03-05: 20 mg via INTRAVENOUS
  Administered 2020-03-05: 10 mg via INTRAVENOUS

## 2020-03-05 MED ORDER — LACTATED RINGERS IV SOLN
INTRAVENOUS | Status: DC
  Administered 2020-03-05: 1000 mL via INTRAVENOUS

## 2020-03-05 MED ORDER — PROPOFOL 500 MG/50ML IV EMUL
INTRAVENOUS | Status: DC | PRN
  Administered 2020-03-05: 100 ug/kg/min via INTRAVENOUS

## 2020-03-05 MED ORDER — PANTOPRAZOLE SODIUM 40 MG IV SOLR
40.0000 mg | Freq: Two times a day (BID) | INTRAVENOUS | Status: DC
  Administered 2020-03-05 – 2020-03-21 (×32): 40 mg via INTRAVENOUS
  Filled 2020-03-05 (×32): qty 40

## 2020-03-05 MED ORDER — PHENYLEPHRINE 40 MCG/ML (10ML) SYRINGE FOR IV PUSH (FOR BLOOD PRESSURE SUPPORT)
PREFILLED_SYRINGE | INTRAVENOUS | Status: DC | PRN
  Administered 2020-03-05 (×3): 80 ug via INTRAVENOUS
  Administered 2020-03-05: 40 ug via INTRAVENOUS
  Administered 2020-03-05: 120 ug via INTRAVENOUS
  Administered 2020-03-05 (×3): 80 ug via INTRAVENOUS

## 2020-03-05 MED ORDER — ALBUMIN HUMAN 5 % IV SOLN: INTRAVENOUS | Status: DC | PRN

## 2020-03-05 SURGICAL SUPPLY — 15 items

## 2020-03-05 NOTE — Plan of Care (Signed)

## 2020-03-05 NOTE — Interval H&P Note (Signed)
History and Physical Interval Note:  03/05/2020 2:02 PM  Yvonne [name]  has presented today for surgery, with the diagnosis of dysphagia.  The various methods of treatment have been discussed with the patient and family. After consideration of risks, benefits and other options for treatment, the patient has consented to  Procedure(s): ESOPHAGOGASTRODUODENOSCOPY (EGD) WITH PROPOFOL (N/A) as a surgical intervention.  The patient's history has been reviewed, patient examined, no change in status, stable for surgery.  I have reviewed the patient's chart and labs.  Questions were answered to the patient's satisfaction.     [place]

## 2020-03-05 NOTE — Anesthesia Preprocedure Evaluation (Signed)
Anesthesia Evaluation  Patient identified by MRN, date of birth, ID band Patient awake    Reviewed: Allergy & Precautions, H&P , NPO status , Patient's Chart, lab work & pertinent test results  Airway Mallampati: II   Neck ROM: full    Dental   Pulmonary asthma , COPD, Current Smoker,    breath sounds clear to auscultation       Cardiovascular hypertension, +CHF   Rhythm:regular Rate:Normal     Neuro/Psych PSYCHIATRIC DISORDERS Anxiety Depression    GI/Hepatic GERD  ,  Endo/Other  diabetes, Type 2  Renal/GU      Musculoskeletal   Abdominal   Peds  Hematology   Anesthesia Other Findings   Reproductive/Obstetrics                             Anesthesia Physical Anesthesia Plan  ASA: III  Anesthesia Plan: MAC   Post-op Pain Management:    Induction: Intravenous  PONV Risk Score and Plan: 1 and Treatment may vary due to age or medical condition and Propofol infusion  Airway Management Planned: Nasal Cannula  Additional Equipment:   Intra-op Plan:   Post-operative Plan:   Informed Consent: I have reviewed the patients History and Physical, chart, labs and discussed the procedure including the risks, benefits and alternatives for the proposed anesthesia with the patient or authorized representative who has indicated his/her understanding and acceptance.       Plan Discussed with: CRNA, Anesthesiologist and Surgeon  Anesthesia Plan Comments:         Anesthesia Quick Evaluation
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
## 2020-03-05 NOTE — Transfer of Care (Signed)
Immediate Anesthesia Transfer of Care Note  Patient: Yvonne [name]  Procedure(s) Performed: ESOPHAGOGASTRODUODENOSCOPY (EGD) WITH PROPOFOL (N/A ) BIOPSY  Patient Location: PACU  Anesthesia Type:MAC  Level of Consciousness: awake and alert   Airway & Oxygen Therapy: Patient Spontanous Breathing and Patient connected to nasal cannula oxygen  Post-op Assessment: Report given to RN and Post -op Vital signs reviewed and stable  Post vital signs: Reviewed and stable  Last Vitals:  Vitals Value Taken Time  BP 101/87 03/05/20 1503  Temp    Pulse 85 03/05/20 1503  Resp 18 03/05/20 1503  SpO2 100 % 03/05/20 1503    Last Pain:  Vitals:   03/05/20 1503  TempSrc: Oral  PainSc: 0-No pain         Complications: No apparent anesthesia complications

## 2020-03-05 NOTE — Progress Notes (Signed)
Recd report from Lisa/endo---patient back on floor at 1530---had 9cm long esophagitis , biopsies done, ok to resume clears, patient will need to repeat EGD as outpatient

## 2020-03-05 NOTE — Progress Notes (Signed)
Notified [facility] team of K+ 2.8 this morning and received orders for K+74mEq x 4 runs. Informed team that patient is refusing to swallow. Patient NPO for possible EGD today. Will continue to monitor closely.

## 2020-03-05 NOTE — Op Note (Addendum)
[facility] Patient Name: Yvonne [name] Procedure Date : 03/05/2020 MRN: [id number] Attending MD: Carlota Raspberry. Havery Moros , MD Date of Birth: 1946-08-30 CSN: [id number] Age: 74 Admit Type: Inpatient Procedure:                Upper GI endoscopy Indications:              Dysphagia, Hematemesis - abnormal barium swallow                            with delayed passage of contrast across the GEJ Providers:                Carlota Raspberry. Havery Moros, MD, Burtis Junes, RN, Laverda Sorenson, Technician, Clearnce Sorrel, CRNA Referring MD:              Medicines:                Monitored Anesthesia Care Complications:            No immediate complications. Estimated blood loss:                            Minimal. Estimated Blood Loss:     Estimated blood loss was minimal. Procedure:                Pre-Anesthesia Assessment:                           - Prior to the procedure, a History and Physical                            was performed, and patient medications and                            allergies were reviewed. The patient's tolerance of                            previous anesthesia was also reviewed. The risks                            and benefits of the procedure and the sedation                            options and risks were discussed with the patient.                            All questions were answered, and informed consent                            was obtained. Prior Anticoagulants: The patient has                            taken no previous anticoagulant or antiplatelet  agents. ASA Grade Assessment: III - A patient with                            severe systemic disease. After reviewing the risks                            and benefits, the patient was deemed in                            satisfactory condition to undergo the procedure.                           After obtaining informed consent, the endoscope was                     passed under direct vision. Throughout the                            procedure, the patient's blood pressure, pulse, and                            oxygen saturations were monitored continuously. The                            [id number] Olympus gastroscope was                            introduced through the mouth, and advanced to the                            second part of duodenum. The upper GI endoscopy was                            accomplished without difficulty. The patient                            tolerated the procedure well. Scope In: Scope Out: Findings:      LA Grade D esophagitis with no bleeding was found 27 to 36 cm from the       incisors. Biopsies were taken with a cold forceps for histology, ensure       no viral etiology..      Subtle mild stricture at the GEJ - not dilated given severe esophagitis.       The exam of the esophagus was otherwise normal. No mass lesions at the       GEJ. GEJ is widely patent      Patchy mildly erythematous mucosa was found in the gastric fundus and in       the gastric antrum.      The exam of the stomach was otherwise normal.      Biopsies were taken with a cold forceps in the gastric body, at the       incisura and in the gastric antrum for Helicobacter pylori testing.      Multiple erosions without bleeding were found in the duodenal bulb.      The exam of the duodenum was otherwise  normal. Impression:               - LA Grade D esophagitis. Biopsied.                           - Mild subtle GEJ stricture - not dilated given                            severe esophagitis, and GEJ is patent                           - Erythematous mucosa in the gastric fundus and                            antrum.                           - Normal stomach otherwise - biopsies taken to rule                            out H pylori                           - Duodenal erosions                           - Normal duodenum  otherwise                           Esophagitis is the likely cause of the patient's                            prior hematemesis and dysphagia / poor eating.                            Unclear if she has been compliant with home dose of                            omeprazole. Recommendation:           - Return patient to hospital ward for ongoing care.                           - Resume previous diet.                           - Continue present medications.                           - Protonix 40mg  twice daily, can give by IV if the                            patient is having difficulty with pills while                            inpatient. Upon discharge would send home on  protonix 40mg  twice daily                           - Liquid carafate 10cc po q 6 hours                           - Await pathology results.                           - Patient should follow up as outpatient for repeat                            EGD to confirm mucosal healing, rule out Barrett's,                            and dilate subtle stricture if dysphagia persists.                            We can coordinate outpatient follow up.                           - Consider gastrin level if patient has been taking                            home PPI given severe esophagitis and duodenal                            erosions                           - We will sign off for now, please call with                            questions Procedure Code(s):        --- Professional ---                           [phone], Esophagogastroduodenoscopy, flexible,                            transoral; with biopsy, single or multiple Diagnosis Code(s):        --- Professional ---                           K20.90, Esophagitis, unspecified without bleeding                           K31.89, Other diseases of stomach and duodenum                           K26.9, Duodenal ulcer, unspecified as acute or                             chronic, without hemorrhage or perforation  R13.10, Dysphagia, unspecified                           K92.0, Hematemesis CPT copyright 2019 [employer] Association. All rights reserved. The codes documented in this report are preliminary and upon coder review may  be revised to meet current compliance requirements. Remo Lipps P. Ladislav Caselli, MD 03/05/2020 2:48:25 PM This report has been signed electronically. Number of Addenda: 0

## 2020-03-06 ENCOUNTER — Other Ambulatory Visit: Payer: Self-pay | Admitting: Physician Assistant

## 2020-03-06 DIAGNOSIS — R630 Anorexia: Secondary | ICD-10-CM

## 2020-03-06 DIAGNOSIS — E118 Type 2 diabetes mellitus with unspecified complications: Secondary | ICD-10-CM

## 2020-03-06 DIAGNOSIS — K208 Other esophagitis without bleeding: Secondary | ICD-10-CM

## 2020-03-06 DIAGNOSIS — K2990 Gastroduodenitis, unspecified, without bleeding: Secondary | ICD-10-CM

## 2020-03-06 LAB — GLUCOSE, CAPILLARY
Glucose-Capillary: 102 mg/dL — ABNORMAL HIGH (ref 70–99)
Glucose-Capillary: 118 mg/dL — ABNORMAL HIGH (ref 70–99)
Glucose-Capillary: 121 mg/dL — ABNORMAL HIGH (ref 70–99)
Glucose-Capillary: 137 mg/dL — ABNORMAL HIGH (ref 70–99)
Glucose-Capillary: 149 mg/dL — ABNORMAL HIGH (ref 70–99)
Glucose-Capillary: 175 mg/dL — ABNORMAL HIGH (ref 70–99)

## 2020-03-06 LAB — BASIC METABOLIC PANEL
Anion gap: 18 — ABNORMAL HIGH (ref 5–15)
Anion gap: 15 (ref 5–15)
BUN: 7 mg/dL — ABNORMAL LOW (ref 8–23)
BUN: 7 mg/dL — ABNORMAL LOW (ref 8–23)
CO2: 18 mmol/L — ABNORMAL LOW (ref 22–32)
CO2: 20 mmol/L — ABNORMAL LOW (ref 22–32)
Calcium: 8.7 mg/dL — ABNORMAL LOW (ref 8.9–10.3)
Calcium: 8.3 mg/dL — ABNORMAL LOW (ref 8.9–10.3)
Chloride: 93 mmol/L — ABNORMAL LOW (ref 98–111)
Chloride: 93 mmol/L — ABNORMAL LOW (ref 98–111)
Creatinine, Ser: 0.78 mg/dL (ref 0.44–1.00)
Creatinine, Ser: 0.67 mg/dL (ref 0.44–1.00)
GFR calc Af Amer: >60 mL/min (ref >60)
GFR calc Af Amer: >60 mL/min (ref >60)
GFR calc non Af Amer: >60 mL/min (ref >60)
GFR calc non Af Amer: >60 mL/min (ref >60)
Glucose, Bld: 130 mg/dL — ABNORMAL HIGH (ref 70–99)
Glucose, Bld: 104 mg/dL — ABNORMAL HIGH (ref 70–99)
Potassium: 2.9 mmol/L — ABNORMAL LOW (ref 3.5–5.1)
Potassium: 4.2 mmol/L (ref 3.5–5.1)
Sodium: 129 mmol/L — ABNORMAL LOW (ref 135–145)
Sodium: 128 mmol/L — ABNORMAL LOW (ref 135–145)

## 2020-03-06 MED ORDER — FUROSEMIDE 10 MG/ML IJ SOLN
20.0000 mg | Freq: Two times a day (BID) | INTRAMUSCULAR | Status: DC
  Administered 2020-03-06 – 2020-03-13 (×13): 20 mg via INTRAVENOUS
  Filled 2020-03-06 (×14): qty 2

## 2020-03-06 MED ORDER — POTASSIUM CHLORIDE 10 MEQ/100ML IV SOLN
10.0000 meq | INTRAVENOUS | Status: AC
  Administered 2020-03-06 (×4): 10 meq via INTRAVENOUS
  Filled 2020-03-06 (×4): qty 100

## 2020-03-06 NOTE — Plan of Care (Signed)
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
## 2020-03-07 ENCOUNTER — Encounter: Payer: Self-pay | Admitting: Gastroenterology

## 2020-03-07 LAB — GLUCOSE, CAPILLARY
Glucose-Capillary: 107 mg/dL — ABNORMAL HIGH (ref 70–99)
Glucose-Capillary: 118 mg/dL — ABNORMAL HIGH (ref 70–99)
Glucose-Capillary: 122 mg/dL — ABNORMAL HIGH (ref 70–99)
Glucose-Capillary: 127 mg/dL — ABNORMAL HIGH (ref 70–99)
Glucose-Capillary: 130 mg/dL — ABNORMAL HIGH (ref 70–99)
Glucose-Capillary: 133 mg/dL — ABNORMAL HIGH (ref 70–99)
Glucose-Capillary: 94 mg/dL (ref 70–99)

## 2020-03-07 LAB — BASIC METABOLIC PANEL
Anion gap: 12 (ref 5–15)
BUN: 5 mg/dL — ABNORMAL LOW (ref 8–23)
CO2: 28 mmol/L (ref 22–32)
Calcium: 9 mg/dL (ref 8.9–10.3)
Chloride: 91 mmol/L — ABNORMAL LOW (ref 98–111)
Creatinine, Ser: 0.81 mg/dL (ref 0.44–1.00)
GFR calc Af Amer: >60 mL/min (ref >60)
GFR calc non Af Amer: >60 mL/min (ref >60)
Glucose, Bld: 115 mg/dL — ABNORMAL HIGH (ref 70–99)
Potassium: 3 mmol/L — ABNORMAL LOW (ref 3.5–5.1)
Sodium: 131 mmol/L — ABNORMAL LOW (ref 135–145)

## 2020-03-07 LAB — SURGICAL PATHOLOGY

## 2020-03-07 MED ORDER — SODIUM CHLORIDE 0.9% FLUSH
10.0000 mL | Freq: Two times a day (BID) | INTRAVENOUS | Status: DC
  Administered 2020-03-07 – 2020-03-22 (×21): 10 mL

## 2020-03-07 MED ORDER — POTASSIUM CHLORIDE 10 MEQ/100ML IV SOLN
10.0000 meq | INTRAVENOUS | Status: AC
  Administered 2020-03-07 (×2): 10 meq via INTRAVENOUS
  Filled 2020-03-07 (×3): qty 100

## 2020-03-07 MED ORDER — MEGESTROL ACETATE 400 MG/10ML PO SUSP
200.0000 mg | Freq: Two times a day (BID) | ORAL | Status: DC
  Administered 2020-03-07 – 2020-03-29 (×43): 200 mg via ORAL
  Filled 2020-03-07 (×20): qty 10
  Filled 2020-03-07: qty 5
  Filled 2020-03-07 (×28): qty 10

## 2020-03-07 MED ORDER — SODIUM CHLORIDE 0.9% FLUSH: 10.0000 mL | INTRAVENOUS | Status: DC | PRN

## 2020-03-07 MED ORDER — POTASSIUM CHLORIDE 10 MEQ/100ML IV SOLN
10.0000 meq | INTRAVENOUS | Status: AC
  Administered 2020-03-07 (×2): 10 meq via INTRAVENOUS
  Filled 2020-03-07 (×2): qty 100

## 2020-03-07 NOTE — Progress Notes (Signed)
Chaplain engaged in initial visit with Yvonne [name].  Yvonne [name] expressed that she did not want any family to come to the hospital because she hates to see them go.  Chaplain affirmed that it is hard to see family leave, but that it sounds like she has a great support system.  Yvonne [name] noted that she is very loved and supported.  She detailed her childhood.  Yvonne [name] stated she is the youngest of about 24 children and that she [place] farm.  She stated that she wished she could go back to those times.  She felt really cared for by an older brother and his wife, and by her grandparents.    Chaplain offered support and the ministry of listening.  Chaplain affirmed the beauty of where Yvonne [name] has come from, and the family support that she has.  Chaplain will continue to follow-up.

## 2020-03-07 NOTE — Plan of Care (Signed)

## 2020-03-07 NOTE — Anesthesia Postprocedure Evaluation (Signed)
Anesthesia Post Note  Patient: Yvonne [name]  Procedure(s) Performed: ESOPHAGOGASTRODUODENOSCOPY (EGD) WITH PROPOFOL (N/A ) BIOPSY     Patient location during evaluation: Endoscopy Anesthesia Type: MAC Level of consciousness: awake and alert Pain management: pain level controlled Vital Signs Assessment: post-procedure vital signs reviewed and stable Respiratory status: spontaneous breathing, nonlabored ventilation, respiratory function stable and patient connected to nasal cannula oxygen Cardiovascular status: blood pressure returned to baseline and stable Postop Assessment: no apparent nausea or vomiting Anesthetic complications: no    Last Vitals:  Vitals:   03/07/20 0304 03/07/20 0802  BP: (!) 100/50 127/78  Pulse: 94 85  Resp: 17 14  Temp: (!) 36.3 C 36.5 C  SpO2: 94% 99%    Last Pain:  Vitals:   03/07/20 0802  TempSrc: Oral  PainSc:                  [place] S
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
## 2020-03-08 LAB — BASIC METABOLIC PANEL
Anion gap: 13 (ref 5–15)
Anion gap: 12 (ref 5–15)
BUN: 7 mg/dL — ABNORMAL LOW (ref 8–23)
BUN: 8 mg/dL (ref 8–23)
CO2: 25 mmol/L (ref 22–32)
CO2: 23 mmol/L (ref 22–32)
Calcium: 8.5 mg/dL — ABNORMAL LOW (ref 8.9–10.3)
Calcium: 8.2 mg/dL — ABNORMAL LOW (ref 8.9–10.3)
Chloride: 91 mmol/L — ABNORMAL LOW (ref 98–111)
Chloride: 94 mmol/L — ABNORMAL LOW (ref 98–111)
Creatinine, Ser: 0.75 mg/dL (ref 0.44–1.00)
Creatinine, Ser: 0.87 mg/dL (ref 0.44–1.00)
GFR calc Af Amer: >60 mL/min (ref >60)
GFR calc Af Amer: >60 mL/min (ref >60)
GFR calc non Af Amer: >60 mL/min (ref >60)
GFR calc non Af Amer: >60 mL/min (ref >60)
Glucose, Bld: 93 mg/dL (ref 70–99)
Glucose, Bld: 198 mg/dL — ABNORMAL HIGH (ref 70–99)
Potassium: 2.9 mmol/L — ABNORMAL LOW (ref 3.5–5.1)
Potassium: 3.4 mmol/L — ABNORMAL LOW (ref 3.5–5.1)
Sodium: 129 mmol/L — ABNORMAL LOW (ref 135–145)
Sodium: 129 mmol/L — ABNORMAL LOW (ref 135–145)

## 2020-03-08 LAB — MAGNESIUM
Magnesium: 1.3 mg/dL — ABNORMAL LOW (ref 1.7–2.4)
Magnesium: 1.7 mg/dL (ref 1.7–2.4)

## 2020-03-08 LAB — GLUCOSE, CAPILLARY
Glucose-Capillary: 100 mg/dL — ABNORMAL HIGH (ref 70–99)
Glucose-Capillary: 157 mg/dL — ABNORMAL HIGH (ref 70–99)
Glucose-Capillary: 147 mg/dL — ABNORMAL HIGH (ref 70–99)
Glucose-Capillary: 191 mg/dL — ABNORMAL HIGH (ref 70–99)

## 2020-03-08 LAB — PHOSPHORUS
Phosphorus: 2.1 mg/dL — ABNORMAL LOW (ref 2.5–4.6)
Phosphorus: 2.3 mg/dL — ABNORMAL LOW (ref 2.5–4.6)

## 2020-03-08 MED ORDER — SODIUM PHOSPHATES 45 MMOLE/15ML IV SOLN
20.0000 mmol | Freq: Once | INTRAVENOUS | Status: AC
  Administered 2020-03-08: 20 mmol via INTRAVENOUS
  Filled 2020-03-08: qty 6.67

## 2020-03-08 MED ORDER — SODIUM CHLORIDE 0.9 % IV BOLUS: 500.0000 mL | Freq: Once | INTRAVENOUS | Status: DC

## 2020-03-08 MED ORDER — POTASSIUM CHLORIDE 10 MEQ/100ML IV SOLN
10.0000 meq | INTRAVENOUS | Status: AC
  Administered 2020-03-08 (×2): 10 meq via INTRAVENOUS
  Filled 2020-03-08 (×2): qty 100

## 2020-03-08 MED ORDER — MAGNESIUM SULFATE 4 GM/100ML IV SOLN
4.0000 g | Freq: Once | INTRAVENOUS | Status: AC
  Administered 2020-03-09: 4 g via INTRAVENOUS
  Filled 2020-03-08: qty 100

## 2020-03-08 NOTE — Plan of Care (Signed)

## 2020-03-08 NOTE — Evaluation (Signed)
Physical Therapy Evaluation Patient Details Name: Yvonne [name] MRN: [id number] DOB: November 25, 1946 Today's Date: 03/08/2020   History of Present Illness  Pt is a 74 yo female s/p UTI, diabetic foot ulcer, DKA and cellulitis. EGD on 4/11 revealing esophagitis, gastritis, and duodenitis. PMHx: dementia, T2DM, COPD, CHF,   Clinical Impression  Pt admitted with above diagnosis and presents to PT with functional limitations due to deficits listed below (See PT problem list). Pt needs skilled PT to maximize independence and safety to allow discharge to SNF.      Follow Up Recommendations SNF    Equipment Recommendations  None recommended by PT    Recommendations for Other Services       Precautions / Restrictions Precautions Precautions: Fall;Other (comment) Precaution Comments: L shoulder poor mobility; incontinent Restrictions Weight Bearing Restrictions: No      Mobility  Bed Mobility Overal bed mobility: Needs Assistance Bed Mobility: Rolling;Sidelying to Sit Rolling: Mod assist Sidelying to sit: Mod assist       General bed mobility comments: Pt sitting EOB with OT  Transfers Overall transfer level: Needs assistance Equipment used: Ambulation equipment used Transfers: Sit to/from Stand Sit to Stand: Mod assist;+2 physical assistance;+2 safety/equipment         General transfer comment: Assist to bring hips up using bed pad. +2 mod from bed and +2 min to rise from elevated seat of Stedy. Used Stedy to go from bed to chair.   Ambulation/Gait             General Gait Details: Unable  Stairs            Wheelchair Mobility    Modified Rankin (Stroke Patients Only)       Balance Overall balance assessment: Needs assistance Sitting-balance support: Feet supported;Bilateral upper extremity supported Sitting balance-Leahy Scale: Fair    Standing balance support: Bilateral upper extremity supported Standing balance-Leahy Scale: Poor Standing  balance comment: Stedy and +2 min for static standing                             Pertinent Vitals/Pain Pain Assessment: Faces Faces Pain Scale: Hurts even more Pain Location: L arm, back Pain Descriptors / Indicators: Discomfort;Grimacing;Guarding Pain Intervention(s): Monitored during session;Repositioned    Home Living Family/patient expects to be discharged to:: Private residence Living Arrangements: Other relatives([name] - niece) Available Help at Discharge: Family;Available PRN/intermittently Type of Home: House Home Access: Stairs to enter Entrance Stairs-Rails: Right Entrance Stairs-Number of Steps: 7 in front; 7 at back Home Layout: One level Home Equipment: Cane - single point;Walker - 2 wheels;Walker - 4 wheels;Wheelchair - manual Additional Comments: Info from pt; no caregivers available    Prior Function Level of Independence: Needs assistance   Gait / Transfers Assistance Needed: Uses AD for mobility  ADL's / Homemaking Assistance Needed: Pt requires assist in/out of shower. Pt's family provides IADLs- meals. pt reports manages own medications. Son takes pt to grocery shop.  Comments: Info from patient     Hand Dominance   Dominant Hand: Right    Extremity/Trunk Assessment   Upper Extremity Assessment Upper Extremity Assessment: Defer to OT evaluation    Lower Extremity Assessment Lower Extremity Assessment: Generalized weakness    Cervical / Trunk Assessment Cervical / Trunk Assessment: Kyphotic  Communication   Communication: No difficulties  Cognition Arousal/Alertness: Awake/alert Behavior During Therapy: Anxious;Flat affect Overall Cognitive Status: History of cognitive impairments - at baseline  General Comments: Pt with dementia at baseline. During PLOF questions, pt easily side tracked or repeating the answer for last question asked. Pt able to read clock and date, but pt unable to  recall date after 3 mins.       General Comments     Exercises     Assessment/Plan    PT Assessment Patient needs continued PT services  PT Problem List Decreased strength;Decreased activity tolerance;Decreased balance;Decreased mobility       PT Treatment Interventions DME instruction;Gait training;Functional mobility training;Therapeutic activities;Therapeutic exercise;Balance training;Patient/family education    PT Goals (Current goals can be found in the Care Plan section)  Acute Rehab PT Goals Patient Stated Goal: to go home PT Goal Formulation: With patient Time For Goal Achievement: 03/22/20 Potential to Achieve Goals: Good    Frequency Min 3X/week   Barriers to discharge Inaccessible home environment stairs to enter    Co-evaluation PT/OT/SLP Co-Evaluation/Treatment: Yes Reason for Co-Treatment: Complexity of the patient's impairments (multi-system involvement);For patient/therapist safety PT goals addressed during session: Mobility/safety with mobility OT goals addressed during session: ADL's and self-care       AM-PAC PT "6 Clicks" Mobility  Outcome Measure Help needed turning from your back to your side while in a flat bed without using bedrails?: A Lot Help needed moving from lying on your back to sitting on the side of a flat bed without using bedrails?: A Lot Help needed moving to and from a bed to a chair (including a wheelchair)?: Total Help needed standing up from a chair using your arms (e.g., wheelchair or bedside chair)?: A Lot Help needed to walk in hospital room?: Total Help needed climbing 3-5 steps with a railing? : Total 6 Click Score: 9    End of Session Equipment Utilized During Treatment: Gait belt Activity Tolerance: Patient limited by fatigue Patient left: in chair;with call bell/phone within reach;with chair alarm set Nurse Communication: Mobility status;Need for lift equipment PT Visit Diagnosis: Unsteadiness on feet (R26.81);Other  abnormalities of gait and mobility (R26.89);Muscle weakness (generalized) (M62.81)    Time: [id number] PT Time Calculation (min) (ACUTE ONLY): 15 min   Charges:   PT Evaluation $PT Eval Moderate Complexity: [place] Pager [phone] Office [place] 03/08/2020, 5:38 PM

## 2020-03-08 NOTE — Evaluation (Signed)
Occupational Therapy Evaluation Patient Details Name: Yvonne [name] MRN: [id number] DOB: 04/16/1946 Today's Date: 03/08/2020    History of Present Illness Pt is a 74 yo female s/p UTI, diabetic foot ulcer, DKA and cellulitis. EGD on 4/11 revealing esophagitis, gastritis, and duodenitis. PMHx: dementia, T2DM, COPD, CHF,    Clinical Impression   Pt PTA: pt reports living with niece who assists with iADLs; pt performing own BADLs. Pt's son providing transport. Pt currently limited by decreased ability to care for self, decreased strength, decreased functional mobility and poor activity tolerance. Pt requiring cues to sit upright at EOB as pt wanting to lean backwards due to fatigue. Pt modA for bed mobility; modA+2 for transfers and use of stedy safest option at this time as pt very fearful of falling. Pt maxA overall for ADL. Pt following most commands, but difficulty with selective attention and memory. Pt would greatly benefit from continued OT skilled services for ADL, mobility and safety in SNF setting. OT following acutely.  BP: 125B-145BPM wth exertion; pt on RA    Follow Up Recommendations  SNF;Supervision/Assistance - 24 hour    Equipment Recommendations  Other (comment)(defer to next venue)    Recommendations for Other Services       Precautions / Restrictions Precautions Precautions: Fall;Other (comment) Precaution Comments: L shoulder poor mobility; incontinent Restrictions Weight Bearing Restrictions: No      Mobility Bed Mobility Overal bed mobility: Needs Assistance Bed Mobility: Rolling;Sidelying to Sit Rolling: Mod assist Sidelying to sit: Mod assist       General bed mobility comments: Use of rail; assist for trunk elevation and BLE management  Transfers Overall transfer level: Needs assistance Equipment used: Ambulation equipment used Transfers: Sit to/from Stand Sit to Stand: Mod assist;+2 physical assistance;+2 safety/equipment         General  transfer comment: ModA +2 from bed and minA +2 from stedy; LUE unable to hold onto stedy    Balance Overall balance assessment: Needs assistance Sitting-balance support: Bilateral upper extremity supported;Feet supported Sitting balance-Leahy Scale: Fair Sitting balance - Comments: wanting to lean backwards due to fatigue; able to keep self upright     Standing balance-Leahy Scale: Poor Standing balance comment: requiring stability of stedy                           ADL either performed or assessed with clinical judgement   ADL Overall ADL's : Needs assistance/impaired Eating/Feeding: Set up;Sitting Eating/Feeding Details (indicate cue type and reason): drinking from cup- has been refusing to eat Grooming: Minimal assistance;Sitting   Upper Body Bathing: Moderate assistance;Sitting   Lower Body Bathing: Maximal assistance;Sitting/lateral leans;Sit to/from stand;Cueing for safety;Cueing for sequencing   Upper Body Dressing : Moderate assistance;Sitting   Lower Body Dressing: Maximal assistance;Sitting/lateral leans;Sit to/from stand   Toilet Transfer: Moderate assistance;+2 for physical assistance;+2 for safety/equipment;Stand-pivot Toilet Transfer Details (indicate cue type and reason): use of stedy Toileting- Clothing Manipulation and Hygiene: Maximal assistance;+2 for physical assistance;+2 for safety/equipment;Cueing for safety;Sit to/from stand;Sitting/lateral lean       Functional mobility during ADLs: Moderate assistance;+2 for physical assistance;+2 for safety/equipment;Cueing for safety(w/ stedy) General ADL Comments: Pt with decreased ability to care for self, decreased strength, decreased functional mobility and poor activity tolerance. Pt requiring cues to sit upright at EOB as pt wanting to lean backwards due to fatigue.     Vision Baseline Vision/History: No visual deficits Patient Visual Report: No change from baseline Vision Assessment?: Yes Eye  Alignment: Impaired (comment)(R eye was slightly abducted) Ocular Range of Motion: Within Functional Limits Additional Comments: Cues for pt to attend, difficulty follow these commands     Perception     Praxis      Pertinent Vitals/Pain Pain Assessment: Faces Faces Pain Scale: Hurts even more Pain Location: L arm, back Pain Descriptors / Indicators: Discomfort;Grimacing;Guarding Pain Intervention(s): Monitored during session;Repositioned     Hand Dominance Right   Extremity/Trunk Assessment Upper Extremity Assessment Upper Extremity Assessment: Generalized weakness;LUE deficits/detail;RUE deficits/detail RUE Deficits / Details: bruising noted, edema; AROM shoulder FF 75* LUE Deficits / Details: bruising noted, edema; AROM shoulder FF 45* before pain LUE Coordination: decreased gross motor   Lower Extremity Assessment Lower Extremity Assessment: Defer to PT evaluation   Cervical / Trunk Assessment Cervical / Trunk Assessment: Kyphotic   Communication Communication Communication: No difficulties   Cognition Arousal/Alertness: Awake/alert Behavior During Therapy: Anxious;Flat affect Overall Cognitive Status: History of cognitive impairments - at baseline                                 General Comments: Pt with dementia at baseline. During PLOF questions, pt easily side tracked or repeating the answer for last question asked. Pt able to read clock and date, but pt unable to recall date after 3 mins.    General Comments  BP: 125B-145BPM wth exertion; pt on RA    Exercises     Shoulder Instructions      Home Living Family/patient expects to be discharged to:: Private residence Living Arrangements: Other relatives(Yvonne [name] - niece) Available Help at Discharge: Family;Available PRN/intermittently Type of Home: House Home Access: Stairs to enter [employer] of Steps: 7 in front; 7 at back Entrance Stairs-Rails: Right Home Layout: One level      Bathroom Shower/Tub: Occupational psychologist: Standard     Home Equipment: Cane - single point;Walker - 2 wheels;Walker - 4 wheels;Wheelchair - manual   Additional Comments: Info from pt; no caregivers available      Prior Functioning/Environment Level of Independence: Needs assistance  Gait / Transfers Assistance Needed: Uses AD for mobility ADL's / Homemaking Assistance Needed: Pt requires assist in/out of shower. Pt's family provides IADLs- meals. pt reports manages own medications. Son takes pt to grocery shop.   Comments: Info from patient        OT Problem List: Decreased strength;Decreased activity tolerance;Impaired balance (sitting and/or standing);Decreased cognition;Decreased safety awareness;Pain;Impaired UE functional use;Cardiopulmonary status limiting activity      OT Treatment/Interventions: Self-care/ADL training;Therapeutic exercise;Energy conservation;DME and/or AE instruction;Therapeutic activities;Cognitive remediation/compensation;Visual/perceptual remediation/compensation;Patient/family education;Balance training    OT Goals(Current goals can be found in the care plan section) Acute Rehab OT Goals Patient Stated Goal: to go home OT Goal Formulation: With patient Time For Goal Achievement: 03/22/20 Potential to Achieve Goals: Good ADL Goals Pt Will Perform Grooming: with min guard assist;standing Pt Will Perform Lower Body Dressing: with min assist;sitting/lateral leans;sit to/from stand Pt Will Transfer to Toilet: with mod assist;stand pivot transfer;bedside commode Additional ADL Goal #1: Pt will demonstrate selective attention duirng ADL task in minimally distracting environment with minimal cues. Additional ADL Goal #2: Pt will participate in 10 mins of ADL tasks with minA overall with 2 rest breaks.  OT Frequency: Min 2X/week   Barriers to D/C: Decreased caregiver support  unsure of exact caregiver support       Co-evaluation PT/OT/SLP  Co-Evaluation/Treatment: Yes Reason for Co-Treatment: Complexity  of the patient's impairments (multi-system involvement);To address functional/ADL transfers   OT goals addressed during session: ADL's and self-care      AM-PAC OT "6 Clicks" Daily Activity     Outcome Measure Help from another person eating meals?: A Little Help from another person taking care of personal grooming?: A Little Help from another person toileting, which includes using toliet, bedpan, or urinal?: A Lot Help from another person bathing (including washing, rinsing, drying)?: A Lot Help from another person to put on and taking off regular upper body clothing?: A Lot Help from another person to put on and taking off regular lower body clothing?: A Lot 6 Click Score: 14   End of Session Equipment Utilized During Treatment: Gait belt;Other (comment)(stedy) Nurse Communication: Mobility status;Other (comment)(use of stedy)  Activity Tolerance: Patient limited by fatigue;Patient limited by lethargy;Patient limited by pain Patient left: in chair;with call bell/phone within reach;with chair alarm set  OT Visit Diagnosis: Unsteadiness on feet (R26.81);Pain;Other symptoms and signs involving cognitive function Pain - Right/Left: Left Pain - part of body: Shoulder                Time: 1520-1600 OT Time Calculation (min): 40 min Charges:  OT General Charges $OT Visit: 1 Visit OT Evaluation $OT Eval Moderate Complexity: 1 Mod OT Treatments $Self Care/Home Management : 8-22 mins  Jefferey Pica, OTR/L Acute Rehabilitation Services Pager: [phone] Office: [phone]   Noeh Sparacino C 03/08/2020, 4:36 PM
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
## 2020-03-08 NOTE — Progress Notes (Signed)
RN was discussing care plan with patient today and the wishes that her family would like for her to get a feeding tube due to her lack of eating. Patient states this morning "My family is not here, I do not want a feeding tube." She also stated that she does not wish for her DIL [name] or [name] or her son Maniford to receive any medical information or allow them to make medical discissions for her. Son called the room and I handed patient the phone and she spoke with him. I spoke with him also and told him that his mom has expressed to the MD's that we are not to update [name], [name] or him when they call, afterwards she spoke with him and stated "deep down in my heart I know you don't want me to come stay with you". The talked on the phone for some time where she did agree that we could update her son Maniford only.

## 2020-03-09 DIAGNOSIS — Z66 Do not resuscitate: Secondary | ICD-10-CM

## 2020-03-09 DIAGNOSIS — Z7189 Other specified counseling: Secondary | ICD-10-CM

## 2020-03-09 DIAGNOSIS — Z515 Encounter for palliative care: Secondary | ICD-10-CM

## 2020-03-09 DIAGNOSIS — K220 Achalasia of cardia: Secondary | ICD-10-CM

## 2020-03-09 LAB — GLUCOSE, CAPILLARY
Glucose-Capillary: 120 mg/dL — ABNORMAL HIGH (ref 70–99)
Glucose-Capillary: 144 mg/dL — ABNORMAL HIGH (ref 70–99)
Glucose-Capillary: 167 mg/dL — ABNORMAL HIGH (ref 70–99)
Glucose-Capillary: 199 mg/dL — ABNORMAL HIGH (ref 70–99)
Glucose-Capillary: 89 mg/dL (ref 70–99)
Glucose-Capillary: 93 mg/dL (ref 70–99)
Glucose-Capillary: 95 mg/dL (ref 70–99)

## 2020-03-09 LAB — BASIC METABOLIC PANEL
Anion gap: 14 (ref 5–15)
BUN: 9 mg/dL (ref 8–23)
CO2: 24 mmol/L (ref 22–32)
Calcium: 8.2 mg/dL — ABNORMAL LOW (ref 8.9–10.3)
Chloride: 94 mmol/L — ABNORMAL LOW (ref 98–111)
Creatinine, Ser: 0.85 mg/dL (ref 0.44–1.00)
GFR calc Af Amer: >60 mL/min (ref >60)
GFR calc non Af Amer: >60 mL/min (ref >60)
Glucose, Bld: 71 mg/dL (ref 70–99)
Potassium: 3.2 mmol/L — ABNORMAL LOW (ref 3.5–5.1)
Sodium: 132 mmol/L — ABNORMAL LOW (ref 135–145)

## 2020-03-09 LAB — MAGNESIUM: Magnesium: 2.7 mg/dL — ABNORMAL HIGH (ref 1.7–2.4)

## 2020-03-09 MED ORDER — BOOST / RESOURCE BREEZE PO LIQD CUSTOM
1.0000 | Freq: Three times a day (TID) | ORAL | Status: DC
  Administered 2020-03-09: 13:00:00 1 via ORAL

## 2020-03-09 MED ORDER — ADULT MULTIVITAMIN W/MINERALS CH
1.0000 | ORAL_TABLET | Freq: Every day | ORAL | Status: DC
  Administered 2020-03-09 – 2020-03-29 (×20): 1 via ORAL
  Filled 2020-03-09 (×21): qty 1

## 2020-03-09 MED ORDER — K PHOS MONO-SOD PHOS DI & MONO 155-852-130 MG PO TABS
250.0000 mg | ORAL_TABLET | Freq: Two times a day (BID) | ORAL | Status: AC
  Administered 2020-03-09 – 2020-03-10 (×4): 250 mg via ORAL
  Filled 2020-03-09 (×4): qty 1

## 2020-03-09 MED ORDER — POTASSIUM CHLORIDE 20 MEQ PO PACK
40.0000 meq | PACK | Freq: Two times a day (BID) | ORAL | Status: DC
  Administered 2020-03-09 – 2020-03-10 (×4): 40 meq via ORAL
  Filled 2020-03-09 (×5): qty 2

## 2020-03-09 MED ORDER — ENSURE ENLIVE PO LIQD
237.0000 mL | Freq: Three times a day (TID) | ORAL | Status: DC
  Administered 2020-03-09 – 2020-03-21 (×34): 237 mL via ORAL
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
## 2020-03-09 NOTE — Progress Notes (Signed)
Initial Nutrition Assessment  DOCUMENTATION CODES:   Not applicable  INTERVENTION:   Given poor intake x 10 days Cortrak is recommended. Patient and family in disagreement with [place] regarding feeding tube. Palliative to assess.    Ensure Enlive po BID, each supplement provides 350 kcal and 20 grams of protein  30 ml Prostat BID, each supplement provides 100 kcals and 15 grams protein.   MVI daily   NUTRITION DIAGNOSIS:   Inadequate oral intake related to poor appetite as evidenced by meal completion < 25%  GOAL:   Patient will meet greater than or equal to 90% of their needs  MONITOR:   PO intake, Supplement acceptance, Weight trends, Diet advancement, Labs, I & O's, Skin  REASON FOR ASSESSMENT:   Consult Assessment of nutrition requirement/status  ASSESSMENT:   Patient with PMH significant for dementia, DM, COPD, CHF, HLD, and chronic pain. Presents this admission with DKA.   4/11- s/p EGD- revealed esophagitis, gastritis, and duodenitis   Unable to obtain history from pt. Has been on clear liquid diet since 4/8. Meal completions documented as 0% since. Family desires feeding tube but pt does not.Given ongoing poor intake Cortrak would be the next step. Recommend palliative meeting to further discuss [place] and possible transition to comfort feedings. SLP to see again today. Will provide supplements to maximize kcal and protein.   Addendum: Diet advanced to DYS 3 per SLP.   Weight records limited over the last year. Pt down 3 kg since admit. Suspect dry weight loss but unable to determine given history of CHF. Pt is likely acutely malnourished given poor intake but unable to diagnosis without knowing acutual weight loss.   Medications: 20 mg lasix BID, SS novolog, megace, 40 mEq KCl BID, K phos Labs: K 129 (L) K 3.4 (L) Phosphorus 2.3 (L) CBG 93-191  Diet Order:   Diet Order            DIET DYS 3 Room service appropriate? Yes with Assist; Fluid consistency: Thin  Diet  effective now              EDUCATION NEEDS:   Not appropriate for education at this time  Skin:  Skin Assessment: Skin Integrity Issues: Skin Integrity Issues:: Other (Comment) Other: wound- L foot  Last BM:  4/14  Height:   Ht Readings from Last 1 Encounters:  02/28/20 5\' 2"  (1.575 m)    Weight:   Wt Readings from Last 1 Encounters:  03/09/20 82.2 kg    BMI:  Body mass index is 33.15 kg/m.  Estimated Nutritional Needs:   Kcal:  1700-1900 kcal  Protein:  85-100 grams  Fluid:  >/= 1.7 L/day   Mariana Single RD, LDN Clinical Nutrition Pager listed in [place]

## 2020-03-09 NOTE — Evaluation (Signed)
Speech Language Pathology Evaluation Patient Details Name: Yvonne [name] MRN: [id number] DOB: 12/07/1945 Today's Date: 03/09/2020 Time: [id number] SLP Time Calculation (min) (ACUTE ONLY): 21 min  Problem List:  Patient Active Problem List   Diagnosis Date Noted  . Dysphagia   . Hematemesis with nausea   . DKA (diabetic ketoacidoses) ([place]) 02/28/2020  . Altered mental status   . Dehydration   . Hypokalemia   . Diabetic neuropathy, painful ([place]) 07/20/2015  . Insomnia due to anxiety and fear 07/20/2015  . Anxiety disorder 10/03/2014  . Preventative health care 10/03/2014  . Type 2 diabetes mellitus without complication ([place]) 0000000  . Depression 06/23/2014  . Essential hypertension 06/23/2014  . Peripheral neuropathic pain 06/23/2014  . Gastroesophageal reflux disease without esophagitis 06/23/2014  . Dyslipidemia 06/23/2014  . Chronic neck pain 03/09/2012  . Right arm pain 03/09/2012  . Chronic low back pain 03/09/2012  . Right knee pain 03/09/2012  . Joint stiffness of hand 03/09/2012  . Encephalopathy 02/05/2012  . Hypoxia 02/01/2012  . Fever 02/01/2012  . Leukocytosis 02/01/2012  . Bronchitis 02/01/2012  . Confusion 02/01/2012  . Diarrhea 02/01/2012  . COPD (chronic obstructive pulmonary disease) ([place]) 02/01/2012  . CAP (community acquired pneumonia) 02/01/2012  . Diabetes mellitus ([place])    Past Medical History:  Past Medical History:  Diagnosis Date  . Asthma   . Blood transfusion   . Cervicalgia   . CHF (congestive heart failure) ([place])   . Chronic pain syndrome   . COPD (chronic obstructive pulmonary disease) ([place])   . Diabetes mellitus   . Disturbance of skin sensation   . Hyperlipidemia   . Hypertension   . Lumbago   . Olecranon bursitis   . Pain in joint, hand   . Polyneuropathy in diabetes(357.2)    Past Surgical History:  Past Surgical History:  Procedure Laterality Date  . ABDOMINAL HYSTERECTOMY    . BACK SURGERY     from bacteria  .  BIOPSY  03/05/2020   Procedure: BIOPSY;  Surgeon: Yetta Flock, MD;  [facility] ENDOSCOPY;  Service: Gastroenterology;;  . ESOPHAGOGASTRODUODENOSCOPY (EGD) WITH PROPOFOL N/A 03/05/2020   Procedure: ESOPHAGOGASTRODUODENOSCOPY (EGD) WITH PROPOFOL;  Surgeon: Yetta Flock, MD;  Location: [place];  Service: Gastroenterology;  Laterality: N/A;   HPI:  Pt is a 74 y.o. female with a history of dementia, diabetes, COPD, CHF, who presented to the hospital on the fourth with altered mental status, high blood sugars, and complaints of hematemesis.  She was found to have a lactic acidosis and electrolyte abnormalities putting hyponatremia and hypokalemia. Esophagram 4/9: Absent normal primary peristaltic wave. Delayed passage of contrast at the gastroesophageal junction and distal esophagus with an area of subtle luminal irregularity. Findings suspicious for "pseudoachalasia" CT head and CXR negative for acute changes. EGD with Dr. Havery Moros on 03/05/2020. She was found to have extensive esophagitis, gastritis and duodenitis. Biopsies were taken. No mass was seen at the GE junction. Pt is currently on clear liquids. GI's note prior to them signing off on 4/11 states that her previous diet can be resumed.   Assessment / Plan / Recommendation Clinical Impression  Pt participated in speech/language evaluation. Pt has a diagnosis of dementia and per EMR metabolic encephalopathy has resolved. Pt's speech and language skills are currently within functional limits but her cognitive-linguistic impairments did impact her performance on receptive language tasks.She presented with cognitive-linguistic deficits in the areas of awareness, attention, memory, and complex problem solving which from EMR appears to  be her baseline. Further skilled SLP services are not clinically indicated at this time for speech, language or cognition but SLP will follow for swallowing.     SLP Assessment  SLP  Recommendation/Assessment: Patient does not need any further Speech Lanaguage Pathology Services SLP Visit Diagnosis: Cognitive communication deficit (R41.841)    Follow Up Recommendations  None    Frequency and Duration min 2x/week         SLP Evaluation Cognition  Overall Cognitive Status: History of cognitive impairments - at baseline Arousal/Alertness: Awake/alert Orientation Level: Oriented to person;Disoriented to time;Disoriented to situation;Oriented to place Attention: Focused;Sustained Focused Attention: Impaired Focused Attention Impairment: Verbal complex Sustained Attention: Impaired Sustained Attention Impairment: Verbal complex Memory: Impaired Memory Impairment: Storage deficit;Retrieval deficit;Decreased recall of new information Awareness: Impaired Awareness Impairment: Intellectual impairment Problem Solving: Impaired Problem Solving Impairment: Verbal complex       Comprehension  Auditory Comprehension Overall Auditory Comprehension: Appears within functional limits for tasks assessed Yes/No Questions: Impaired Basic Immediate Environment Questions: (5/5) Complex Questions: (4/5) Paragraph Comprehension (via yes/no questions): (1/4) Commands: Impaired One Step Basic Commands: (4/4) Two Step Basic Commands: (0/4) Conversation: Complex Interfering Components: Attention;Processing speed;Working Field seismologist: Science writer Reading Comprehension Reading Status: Not tested    Expression Expression Primary Mode of Expression: Verbal Verbal Expression Overall Verbal Expression: Appears within functional limits for tasks assessed Initiation: No impairment Automatic Speech: Counting;Day of week;Month of year(WNL) Level of Generative/Spontaneous Verbalization: Conversation Repetition: No impairment Naming: Impairment Responsive: (4/5) Confrontation: (4/4) Pragmatics: No impairment Interfering Components: Attention    Oral / Motor  Oral Motor/Sensory Function Overall Oral Motor/Sensory Function: Within functional limits Motor Speech Overall Motor Speech: Appears within functional limits for tasks assessed Respiration: Within functional limits Phonation: Normal Resonance: Within functional limits Articulation: Within functional limitis Intelligibility: Intelligible Motor Planning: Witnin functional limits Motor Speech Errors: Not applicable   Riddik Senna I. Hardin Negus, [place], [place] Office number [phone] Pager [place] 03/09/2020, 3:56 PM

## 2020-03-09 NOTE — Consult Note (Signed)
Consultation Note Date: 03/09/2020   Patient Name: Yvonne [name]  DOB: 17-Nov-1946  MRN: [id number]  Age / Sex: 74 y.o., female   PCP: Tresa Garter, MD Referring Physician: Shawna Clamp, MD   REASON FOR CONSULTATION:Establishing goals of care  Palliative Care consult requested for goals of care discussion in this 74 y.o. female with multiple medical problems including diabetes, chronic pain, hyperlipidemia, COPD, hypertension, and polyneuropathy. She presented to the ED from home with complaints of hematemesis and elevated blood sugars. It was noted that family (daughter-in-law) came in with patient but had not seen her in over 6 months. During work-up patient was found to have [place] with concomitant metabolic alkalosis. Yvonne [name] was able to specify, on admission, that she had not taken her insulin in "some month" but some of her medications had recently been filled. Since admission patient has been receiving treatment for DKA. She has been seen by GI and underwent an EGD on 03/05/20. Biopsies were taken, no mass was seen, reports showed extensive esophagitis, gastritis, and duodenitis. Recommendations for PPI and sucralfate were initiated. Family was leaning towards PEG tube placement if needed however, patient has verbalized in the setting of competency that she would not want a PEG placed.   Clinical Assessment and Goals of Care: I have reviewed medical records including lab results, imaging, Epic notes, and MAR, received report from the bedside RN, and assessed the patient. I met at the bedside with Yvonne [name] to discuss diagnosis prognosis, GOC, EOL wishes, disposition and options.  Yvonne [name] was sitting up in bed watching TV. She is awake, alert and oriented x3. She is able to answer all questions appropriately and engage in discussion. I offered to include other family members in our discussion however patient loudly yells "NO". She then apologizes for her direct response and  loudness. Support given. She request not to include any family in our discussion. She shares she does not wish for her family to receive updates other than her son [name] or his wife [name]. She reports they may receive updates but [name] is not to make decisions for her. Support given in the setting of patient is appropriate in her responses to make these decisions.   I introduced Palliative Medicine as specialized medical care for people living with serious illness. It focuses on providing relief from the symptoms and stress of a serious illness. The goal is to improve quality of life for both the patient and the family.  We discussed a brief life review of the patient, along with her functional and nutritional status. Yvonne [name] reports she lives in the home with her brother. She is widowed. She had 3 children, [name], and [name]. Unfortunately [name] and [name] have passed away. She reports [place] [employer] with her parents and 19 siblings. 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 She was proud to say that she was one of the few of her siblings that graduated from high school. She reports [name] is her deceased son's wife and [name] is her son, [name]'s wife. She is [place].  Prior to admission she reports living with her brother, [name] who assisted her with doctor's appointments and her care. She reports her sister [name] assisted her with ADLs, showers etc. She was ambulatory in the home however she used a cane or a crutch due to her foot wound. She reports a decrease in her appetite for several weeks prior  to admission. She reports food tasting differently and loss of appetite. However prior to that she reports "I probably ate to much"   We discussed Her current illness and what it means in the larger context of Her on-going co-morbidities. Natural disease trajectory and expectations at EOL were discussed. Yvonne [name] was able to verbalize her understanding of her  current illness and co-morbidities.   We discussed at length her care in the home, which she felt was ok as well as medical management. I discussed in detail her poor po intake and concerns that she was not receiving appropriate nutritional intake required to sustain life overtime. She verbalized understanding expressing she knew the medical team was concerned, however she feels that her appetite is coming back now that she is feeling better. She is asking me if I could make arrangements to get her some pancakes, grits, with a ice cold Pepsi. We discussed her diabetes and appropriate diet and food choices. She laughs, stating she knows but when she wants certain foods she eats them without regards.   I attempted to elicit values and goals of care important to the patient.    Patient states that she has some "issues" in her family and that is why she did not want any of them making decisions for her. She states she has not seen most of her daughter-in-laws in more than 6-9 months and only sees her son at times. Support given.   I discussed at length with patient her wishes and goals of care, what she is hopeful medically. Yvonne [name] states she absolutely did not want a feeding tube regardless of the situation. She reports when the time comes that she cannot eat at all then that is when "[place] be her next home"! She reports she knows her family would want her to have a feeding tube and everything done but she does not want this and hopes that the doctors would stand up for her. Stating "it is not their body or life!" Therapeutic listening and support given.   I created space and opportunity to further discuss advanced directives, concepts  specific to code status, and rehospitalization. Yvonne [name] states she does not have an advanced directive. We discussed at length her full code status with consideration to her current illness and co-morbidities. Patient states "No please do not ever place me  on machines or life support. Absolutely not!" support given. I discussed at length based on her wishes she would be considered a DNR. I reviewed ACLS, BiPAP, defibrillation, and CPR with her. Yvonne [name] reports she did not want any heroic measures and states "when I die I am gone, I don't ever want to be brought back!" Support given. She verbalized wishes for DNR and again asked that we not let her family change her decisions in the event she could not speak for herself.   I discussed in detail MOST form and creating an advanced directive. Yvonne [name] reports she does not wish to complete and advanced directive at this time. She reports her son Adalynne Steffensmeier would be her decision maker in the event she cannot speak for herself, again emphasizing limitations of not overriding her current wishes for no PEG and DNR/DNI. She does request to complete the MOST form.   MOST form completed with patient's specified wishes for DNR, determine use of antibiotics, IV fluids for trial period, and no feeding tube. MOST completed and signed by patient.   Hospice and Palliative Care services outpatient  were explained and offered. Patient states she is not ready for hospice but is in agreement with outpatient palliative support.   Goals are clear patient is hopeful for improvement, states she feels her appetite is returning, and would like to continue with medical care. She has made it clear that she would not want artificial feeding/PEG or heroic measures. She did not want me to include family in our discussion, which I respected in the setting Yvonne [name] was alert and oriented to make her decisions.   Questions and concerns were addressed. Patient was encouraged to call with questions or concerns.  PMT will continue to support holistically as needed.   SOCIAL HISTORY:     reports that she has been smoking cigarettes. She has a 15.00 pack-year smoking history. She has never used smokeless tobacco. She reports that she  does not drink alcohol or use drugs.  CODE STATUS: DNR  ADVANCE DIRECTIVES: Patient. In the setting patient was unable to make decisions she reports her son, [name] would be her decision maker being her only surviving child.    SYMPTOM MANAGEMENT: per attending   Palliative Prophylaxis:   Aspiration, Bowel Regimen and Oral Care  PSYCHO-SOCIAL/SPIRITUAL:  Support System: Family  Desire for further Chaplaincy support:NO   Additional Recommendations (Limitations, Scope, Preferences):  Full Scope Treatment, No Artificial Feeding and DNR   PAST MEDICAL HISTORY: Past Medical History:  Diagnosis Date  . Asthma   . Blood transfusion   . Cervicalgia   . CHF (congestive heart failure) ([place])   . Chronic pain syndrome   . COPD (chronic obstructive pulmonary disease) ([place])   . Diabetes mellitus   . Disturbance of skin sensation   . Hyperlipidemia   . Hypertension   . Lumbago   . Olecranon bursitis   . Pain in joint, hand   . Polyneuropathy in diabetes(357.2)     PAST SURGICAL HISTORY:  Past Surgical History:  Procedure Laterality Date  . ABDOMINAL HYSTERECTOMY    . BACK SURGERY     from bacteria  . BIOPSY  03/05/2020   Procedure: BIOPSY;  Surgeon: Yetta Flock, MD;  Location: [facility] END[facility]COPY;  Service: Gastroenterology;;  . ESOPHAGOGASTRODUODENOSCOPY (EGD) WITH PROPOFOL N/A 03/05/2020   Procedure: ESOPHAGOGASTRODUODENOSCOPY (EGD) WITH PROPOFOL;  Surgeon: Yetta Flock, MD;  Location: [place];  Service: Gastroenterology;  Laterality: N/A;    ALLERGIES:  has No Known Allergies.   MEDICATIONS:  Current Facility-Administered Medications  Medication Dose Route Frequency Provider Last Rate Last Admin  . ALPRAZolam (XANAX) tablet 0.25 mg  0.25 mg Oral Daily PRN Swayze, Ava, DO      . DULoxetine (CYMBALTA) DR capsule 30 mg  30 mg Oral Daily Gilles Chiquito B, MD   30 mg at 03/09/20 1100  . furosemide (LASIX) injection 20 mg  20 mg Intravenous Q12H Swayze, Ava,  DO   20 mg at 03/09/20 0031  . gabapentin (NEURONTIN) capsule 100 mg  100 mg Oral TID Swayze, Ava, DO   100 mg at 03/09/20 1100  . insulin aspart (novoLOG) injection 0-15 Units  0-15 Units Subcutaneous Q4H Swayze, Ava, DO   2 Units at 03/09/20 0030  . magic mouthwash w/lidocaine  5 mL Oral QID Swayze, Ava, DO   5 mL at 03/09/20 1104  . megestrol (MEGACE) 400 MG/10ML suspension 200 mg  200 mg Oral BID Swayze, Ava, DO   200 mg at 03/09/20 1102  . nystatin (MYCOSTATIN) 100000 UNIT/ML suspension 500,000 Units  5 mL Oral QID Swayze,  Ava, DO   500,000 Units at 03/09/20 1101  . ondansetron (ZOFRAN) injection 4 mg  4 mg Intravenous Q6H PRN Swayze, Ava, DO   4 mg at 03/05/20 1554  . pantoprazole (PROTONIX) injection 40 mg  40 mg Intravenous Q12H Yetta Flock, MD   40 mg at 03/09/20 1104  . phosphorus (K PHOS NEUTRAL) tablet 250 mg  250 mg Oral BID Shawna Clamp, MD   250 mg at 03/09/20 1101  . potassium chloride (KLOR-CON) packet 40 mEq  40 mEq Oral BID Shawna Clamp, MD   40 mEq at 03/09/20 1101  . prochlorperazine (COMPAZINE) injection 10 mg  10 mg Intravenous Q4H PRN Swayze, Ava, DO   10 mg at 03/05/20 1658  . sodium chloride 0.9 % bolus 500 mL  500 mL Intravenous Once Swayze, Ava, DO 250 mL/hr at 03/08/20 0957 Rate Change at 03/08/20 0957  . sodium chloride flush (NS) 0.9 % injection 10-40 mL  10-40 mL Intracatheter Q12H Swayze, Ava, DO   10 mL at 03/08/20 2140  . sodium chloride flush (NS) 0.9 % injection 10-40 mL  10-40 mL Intracatheter PRN Swayze, Ava, DO      . sodium chloride tablet 2 g  2 g Oral BID WC Swayze, Ava, DO   2 g at 03/09/20 0630  . sucralfate (CARAFATE) 1 GM/10ML suspension 1 g  1 g Oral Q6H Armbruster, Carlota Raspberry, MD   1 g at 03/09/20 1101    VITAL SIGNS: BP 119/85 (BP Location: Right Wrist)   Pulse 80   Temp (!) 97.5 F (36.4 C) (Oral)   Resp (!) 9   Ht 5' 2"  (1.575 m)   Wt 82.2 kg   SpO2 95%   BMI 33.15 kg/m  Filed Weights   03/07/20 0304 03/08/20 0303 03/09/20  0355  Weight: 81.9 kg 81.6 kg 82.2 kg    Estimated body mass index is 33.15 kg/m as calculated from the following:   Height as of this encounter: 5' 2"  (1.575 m).   Weight as of this encounter: 82.2 kg.  LABS: CBC:    Component Value Date/Time   WBC 11.3 (H) 03/04/2020 0234   HGB 12.4 03/04/2020 0234   HGB 13.2 01/16/2009 1535   HCT 37.6 03/04/2020 0234   HCT 42.8 01/16/2009 1535   PLT 274 03/04/2020 0234   PLT 289 01/16/2009 1535   Comprehensive Metabolic Panel:    Component Value Date/Time   NA 129 (L) 03/08/2020 2013   K 3.4 (L) 03/08/2020 2013   CO2 23 03/08/2020 2013   BUN 8 03/08/2020 2013   CREATININE 0.87 03/08/2020 2013   CREATININE 1.35 (H) 12/29/2017 1220   CREATININE 0.82 06/23/2014 1648   ALBUMIN 2.5 (L) 02/29/2020 0239     Review of Systems  Constitutional: Positive for appetite change and fatigue.  Neurological: Positive for weakness.  Unless otherwise noted, a complete review of systems is negative.  Physical Exam General: NAD Cardiovascular: regular rate and rhythm Pulmonary: clear ant fields Abdomen: soft, nontender, + bowel sounds Extremities: no edema, no joint deformities Skin: no rashes Neurological: awake, A&O x3, mood appropriate, judgement normal   Prognosis: Guarded   Discharge Planning:  To Be Determined  Recommendations:  DNR/SNI-as requested and confirmed by patient.   Patient declined to complete advanced directive however she did request to complete MOST form. Form completed with her signature and wishes (DNR, limited interventions, determine use of abx, IV fluids trial period, no feeding tube.  Patient direct in asking  that family not go against her express wishes.   Goals are clear patient is hopeful for improvement, states she feels her appetite is returning, and would like to continue with medical care. She has made it clear that she would not want artificial feeding/PEG or heroic measures. She did not want me to include  family in our discussion, which I respected in the setting Yvonne [name] was alert and oriented to make her decisions.  Agreeable and requesting outpatient palliative support. (referral placed)  PMT will continue to follow along in chart peripherally for further needs to re-engage or for patient decline. Please call PMT line for urgent needs.    Palliative Performance Scale: PPS 30%               Patient expressed understanding and was in agreement with this plan.   Thank you for allowing the Palliative Medicine Team to assist in the care of this patient.  Time In: 0930 Time Out: 1030 Time Total: 60 min.   Visit consisted of counseling and education dealing with the complex and emotionally intense issues of symptom management and palliative care in the setting of serious and potentially life-threatening illness.Greater than 50%  of this time was spent counseling and coordinating care related to the above assessment and plan.  Signed by:  Alda Lea, AGPCNP-BC Palliative Medicine Team  Phone: [phone] Fax: [phone] Pager: [phone] Amion: Bjorn Pippin

## 2020-03-09 NOTE — Progress Notes (Addendum)
Initial Nutrition Assessment  DOCUMENTATION CODES:   Not applicable  INTERVENTION:   Given poor intake x 10 days Cortrak is recommended. Patient and family in disagreement with [place] regarding feeding tube. Palliative to assess.    Boost Breeze po TID, each supplement provides 250 kcal and 9 grams of protein  30 ml Prostat BID, each supplement provides 100 kcals and 15 grams protein.   MVI daily   NUTRITION DIAGNOSIS:   Inadequate oral intake related to poor appetite as evidenced by meal completion < 25%  GOAL:   Patient will meet greater than or equal to 90% of their needs  MONITOR:   PO intake, Supplement acceptance, Weight trends, Diet advancement, Labs, I & O's, Skin  REASON FOR ASSESSMENT:   Consult Assessment of nutrition requirement/status  ASSESSMENT:   Patient with PMH significant for dementia, DM, COPD, CHF, HLD, and chronic pain. Presents this admission with DKA.   4/11- s/p EGD- revealed esophagitis, gastritis, and duodenitis   Unable to obtain history from pt. Has been on clear liquid diet since 4/8. Meal completions documented as 0% since. Family desires feeding tube but pt does not.Given ongoing poor intake Cortrak would be the next step. Recommend palliative meeting to further discuss [place] and possible transition to comfort feedings. SLP to see again today. Will provide supplements to maximize kcal and protein.   Weight records limited over the last year. Pt down 3 kg since admit. Suspect dry weight loss but unable to determine given history of CHF. Pt is likely acutely malnourished given poor intake but unable to diagnosis without knowing acutual weight loss.   Medications: 20 mg lasix BID, SS novolog, megace, 40 mEq KCl BID, K phos Labs: K 129 (L) K 3.4 (L) Phosphorus 2.3 (L) CBG 93-191  Diet Order:   Diet Order            Diet clear liquid Room service appropriate? Yes; Fluid consistency: Thin  Diet effective now              EDUCATION NEEDS:    Not appropriate for education at this time  Skin:  Skin Assessment: Skin Integrity Issues: Skin Integrity Issues:: Other (Comment) Other: wound- L foot  Last BM:  4/14  Height:   Ht Readings from Last 1 Encounters:  02/28/20 5\' 2"  (1.575 m)    Weight:   Wt Readings from Last 1 Encounters:  03/09/20 82.2 kg    BMI:  Body mass index is 33.15 kg/m.  Estimated Nutritional Needs:   Kcal:  1700-1900 kcal  Protein:  85-100 grams  Fluid:  >/= 1.7 L/day   Mariana Single RD, LDN Clinical Nutrition Pager listed in [place]

## 2020-03-09 NOTE — Plan of Care (Signed)

## 2020-03-09 NOTE — Evaluation (Signed)
Clinical/Bedside Swallow Evaluation Patient Details  Name: Yvonne [name] MRN: [id number] Date of Birth: Dec 07, 1945  Today's Date: 03/09/2020 Time: SLP Start Time (ACUTE ONLY): 33 SLP Stop Time (ACUTE ONLY): 1400 SLP Time Calculation (min) (ACUTE ONLY): 15 min  Past Medical History:  Past Medical History:  Diagnosis Date  . Asthma   . Blood transfusion   . Cervicalgia   . CHF (congestive heart failure) ([place])   . Chronic pain syndrome   . COPD (chronic obstructive pulmonary disease) ([place])   . Diabetes mellitus   . Disturbance of skin sensation   . Hyperlipidemia   . Hypertension   . Lumbago   . Olecranon bursitis   . Pain in joint, hand   . Polyneuropathy in diabetes(357.2)    Past Surgical History:  Past Surgical History:  Procedure Laterality Date  . ABDOMINAL HYSTERECTOMY    . BACK SURGERY     from bacteria  . BIOPSY  03/05/2020   Procedure: BIOPSY;  Surgeon: Yetta Flock, MD;  Location: [facility] ENDOSCOPY;  Service: Gastroenterology;;  . ESOPHAGOGASTRODUODENOSCOPY (EGD) WITH PROPOFOL N/A 03/05/2020   Procedure: ESOPHAGOGASTRODUODENOSCOPY (EGD) WITH PROPOFOL;  Surgeon: Yetta Flock, MD;  Location: [place];  Service: Gastroenterology;  Laterality: N/A;   HPI:  Pt is a 74 y.o. female with a history of dementia, diabetes, COPD, CHF, who presented to the hospital on the fourth with altered mental status, high blood sugars, and complaints of hematemesis.  She was found to have a lactic acidosis and electrolyte abnormalities putting hyponatremia and hypokalemia. Esophagram 4/9: Absent normal primary peristaltic wave. Delayed passage of contrast at the gastroesophageal junction and distal esophagus with an area of subtle luminal irregularity. Findings suspicious for "pseudoachalasia" CT head and CXR negative for acute changes. EGD with Dr. Havery Moros on 03/05/2020. She was found to have extensive esophagitis, gastritis and duodenitis. Biopsies were taken. No mass was  seen at the GE junction. Pt is currently on clear liquids. GI's note prior to them signing off on 4/11 states that her previous diet can be resumed.   Assessment / Plan / Recommendation Clinical Impression  (P) Pt was seen for bedside swallow evaluation and she denied a history of oropharyngeal dysphagia but stated that her appetite has just been poor. Oral mechanism exam was [facility]. Pt was edentulous and dentures were not found in room by SLP. She tolerated all solids and liquids without signs or symptoms of aspiration. Mastication time was increased secondary to edentulous status but functional. A dysphagia 3 diet with thin liquids is recommended at this time. SLP will follow to assess diet tolerance.  SLP Visit Diagnosis: (P) Dysphagia, unspecified (R13.10)    Aspiration Risk  (P) No limitations    Diet Recommendation (P) Dysphagia 3 (Mech soft);Thin liquid   Liquid Administration via: (P) Cup;Straw Medication Administration: (P) Whole meds with puree Supervision: (P) Patient able to self feed Compensations: (P) Slow rate Postural Changes: (P) Seated upright at 90 degrees;Remain upright for at least 30 minutes after po intake    Other  Recommendations Oral Care Recommendations: (P) Oral care BID   Follow up Recommendations (P) None      Frequency and Duration (P) min 2x/week  (P) 1 week       Prognosis Prognosis for Safe Diet Advancement: (P) Good Barriers to Reach Goals: (P) Cognitive deficits      Swallow Study   General Date of Onset: 03/08/20 HPI: Pt is a 74 y.o. female with a history of dementia, diabetes, COPD,  CHF, who presented to the hospital on the fourth with altered mental status, high blood sugars, and complaints of hematemesis.  She was found to have a lactic acidosis and electrolyte abnormalities putting hyponatremia and hypokalemia. Esophagram 4/9: Absent normal primary peristaltic wave. Delayed passage of contrast at the gastroesophageal junction and distal  esophagus with an area of subtle luminal irregularity. Findings suspicious for "pseudoachalasia" CT head and CXR negative for acute changes. EGD with Dr. Havery Moros on 03/05/2020. She was found to have extensive esophagitis, gastritis and duodenitis. Biopsies were taken. No mass was seen at the GE junction. Pt is currently on clear liquids. GI's note prior to them signing off on 4/11 states that her previous diet can be resumed. Type of Study: Bedside Swallow Evaluation Previous Swallow Assessment: None Diet Prior to this Study: Thin liquids(clear) Temperature Spikes Noted: No Respiratory Status: Room air History of Recent Intubation: No Behavior/Cognition: Alert;Cooperative;Pleasant mood Oral Cavity Assessment: Within Functional Limits Oral Care Completed by SLP: No Oral Cavity - Dentition: Dentures, bottom;Dentures, top Vision: Functional for self-feeding Self-Feeding Abilities: Able to feed self Patient Positioning: Upright in bed;Postural control adequate for testing Baseline Vocal Quality: Normal Volitional Cough: Strong Volitional Swallow: Able to elicit    Oral/Motor/Sensory Function Overall Oral Motor/Sensory Function: Within functional limits   Ice Chips Ice chips: Within functional limits Presentation: Spoon   Thin Liquid Thin Liquid: Within functional limits Presentation: Straw    Nectar Thick Nectar Thick Liquid: Not tested   Honey Thick Honey Thick Liquid: Not tested   Puree Puree: (Pt refused puree solids)   Solid     Solid: Impaired Presentation: Spoon Oral Phase Impairments: Impaired mastication(increased but WFL due to edentulous status)     Bentley Fissel I. Hardin Negus, [place], [place] Office number [phone] Pager [place] 03/09/2020,3:27 PM

## 2020-03-10 LAB — PHOSPHORUS: Phosphorus: 2.6 mg/dL (ref 2.5–4.6)

## 2020-03-10 LAB — BASIC METABOLIC PANEL
Anion gap: 12 (ref 5–15)
BUN: 8 mg/dL (ref 8–23)
CO2: 25 mmol/L (ref 22–32)
Calcium: 8.6 mg/dL — ABNORMAL LOW (ref 8.9–10.3)
Chloride: 95 mmol/L — ABNORMAL LOW (ref 98–111)
Creatinine, Ser: 0.94 mg/dL (ref 0.44–1.00)
GFR calc Af Amer: >60 mL/min (ref >60)
GFR calc non Af Amer: 59 mL/min — ABNORMAL LOW (ref >60)
Glucose, Bld: 185 mg/dL — ABNORMAL HIGH (ref 70–99)
Potassium: 3.3 mmol/L — ABNORMAL LOW (ref 3.5–5.1)
Sodium: 132 mmol/L — ABNORMAL LOW (ref 135–145)

## 2020-03-10 LAB — CBC
HCT: 35.8 % — ABNORMAL LOW (ref 36.0–46.0)
Hemoglobin: 11.7 g/dL — ABNORMAL LOW (ref 12.0–15.0)
MCH: 23.4 pg — ABNORMAL LOW (ref 26.0–34.0)
MCHC: 32.7 g/dL (ref 30.0–36.0)
MCV: 71.6 fL — ABNORMAL LOW (ref 80.0–100.0)
Platelets: 255 10*3/uL (ref 150–400)
RBC: 5 MIL/uL (ref 3.87–5.11)
RDW: 14 % (ref 11.5–15.5)
WBC: 11.8 10*3/uL — ABNORMAL HIGH (ref 4.0–10.5)
nRBC: 0.3 % — ABNORMAL HIGH (ref 0.0–0.2)

## 2020-03-10 LAB — GLUCOSE, CAPILLARY
Glucose-Capillary: 214 mg/dL — ABNORMAL HIGH (ref 70–99)
Glucose-Capillary: 105 mg/dL — ABNORMAL HIGH (ref 70–99)
Glucose-Capillary: 140 mg/dL — ABNORMAL HIGH (ref 70–99)
Glucose-Capillary: 138 mg/dL — ABNORMAL HIGH (ref 70–99)
Glucose-Capillary: 191 mg/dL — ABNORMAL HIGH (ref 70–99)

## 2020-03-10 LAB — MAGNESIUM: Magnesium: 1.7 mg/dL (ref 1.7–2.4)

## 2020-03-10 MED ORDER — POTASSIUM CHLORIDE 20 MEQ PO PACK
40.0000 meq | PACK | Freq: Once | ORAL | Status: DC
  Filled 2020-03-10: qty 2

## 2020-03-10 NOTE — Plan of Care (Signed)
  Problem: Clinical Measurements: Goal: Will remain free from infection Outcome: Progressing Goal: Diagnostic test results will improve Outcome: Progressing   Problem: Activity: Goal: Risk for activity intolerance will decrease Outcome: Progressing   
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
## 2020-03-10 NOTE — Progress Notes (Signed)
Physical Therapy Treatment Patient Details Name: Yvonne [name] MRN: [id number] DOB: 1946/10/23 Today's Date: 03/10/2020    History of Present Illness Pt is a 74 yo female s/p UTI, diabetic foot ulcer, DKA and cellulitis. EGD on 4/11 revealing esophagitis, gastritis, and duodenitis. PMHx: dementia, T2DM, COPD, CHF,     PT Comments    Pt supine on arrival with linens soaked in urine and pt unaware. Pt able to progress mobility from initial stand and mobility with stedy to standing from recliner with min assist and limited gait. Pt moves very slowly with pauses between all transitional movements. Will continue to follow with D/C plan appropriate.     Follow Up Recommendations  SNF;Supervision/Assistance - 24 hour     Equipment Recommendations  None recommended by PT    Recommendations for Other Services       Precautions / Restrictions Precautions Precautions: Fall;Other (comment) Precaution Comments: L shoulder poor mobility; incontinent    Mobility  Bed Mobility Overal bed mobility: Needs Assistance Bed Mobility: Supine to Sit     Supine to sit: Mod assist     General bed mobility comments: increased time with HOB 30 degrees, slow movement with assist to elevate trunk and scoot to EOB  Transfers Overall transfer level: Needs assistance   Transfers: Sit to/from Stand;Stand Pivot Transfers Sit to Stand: Min assist;Mod assist;+2 physical assistance         General transfer comment: initial stand with use of stedy with mod +2 increased time and cues to stand to stedy. Use of stedy to pivot from bed to chair. pt then able to stand from recliner with min assist and take steps  Ambulation/Gait Ambulation/Gait assistance: Min assist Gait Distance (Feet): 10 Feet Assistive device: Rolling walker (2 wheeled) Gait Pattern/deviations: Step-through pattern;Decreased stride length;Trunk flexed   Gait velocity interpretation: <1.8 ft/sec, indicate of risk for recurrent  falls General Gait Details: cues for posture, direction, proximity to RW and safety. Close chair positioning   Stairs             Wheelchair Mobility    Modified Rankin (Stroke Patients Only)       Balance Overall balance assessment: Needs assistance   Sitting balance-Leahy Scale: Fair     Standing balance support: Bilateral upper extremity supported Standing balance-Leahy Scale: Poor                              Cognition Arousal/Alertness: Awake/alert Behavior During Therapy: Flat affect Overall Cognitive Status: No family/caregiver present to determine baseline cognitive functioning                                 General Comments: pt slow to respond, decreased memory, unaware sitting in soaked linens on arrival      Exercises General Exercises - Lower Extremity Long Arc Quad: AROM;Both;Seated;10 reps Hip Flexion/Marching: AROM;Both;Seated;10 reps    General Comments        Pertinent Vitals/Pain Pain Assessment: No/denies pain    Home Living                      Prior Function            PT Goals (current goals can now be found in the care plan section) Progress towards PT goals: Progressing toward goals    Frequency    Min 3X/week  PT Plan Current plan remains appropriate    Co-evaluation              AM-PAC PT "6 Clicks" Mobility   Outcome Measure  Help needed turning from your back to your side while in a flat bed without using bedrails?: A Lot Help needed moving from lying on your back to sitting on the side of a flat bed without using bedrails?: A Lot Help needed moving to and from a bed to a chair (including a wheelchair)?: A Little Help needed standing up from a chair using your arms (e.g., wheelchair or bedside chair)?: A Lot Help needed to walk in hospital room?: A Little Help needed climbing 3-5 steps with a railing? : Total 6 Click Score: 13    End of Session   Activity  Tolerance: Patient tolerated treatment well Patient left: in chair;with call bell/phone within reach;with chair alarm set Nurse Communication: Mobility status PT Visit Diagnosis: Unsteadiness on feet (R26.81);Other abnormalities of gait and mobility (R26.89);Muscle weakness (generalized) (M62.81)     Time: [id number] PT Time Calculation (min) (ACUTE ONLY): 32 min  Charges:  $Therapeutic Exercise: 8-22 mins $Therapeutic Activity: 8-22 mins                     Elijah Michaelis P, PT Acute Rehabilitation Services Pager: [phone] Office: [place] Andreya Lacks 03/10/2020, 1:30 PM

## 2020-03-11 LAB — BASIC METABOLIC PANEL
Anion gap: 11 (ref 5–15)
BUN: 8 mg/dL (ref 8–23)
CO2: 25 mmol/L (ref 22–32)
Calcium: 8.7 mg/dL — ABNORMAL LOW (ref 8.9–10.3)
Chloride: 96 mmol/L — ABNORMAL LOW (ref 98–111)
Creatinine, Ser: 0.66 mg/dL (ref 0.44–1.00)
GFR calc Af Amer: >60 mL/min (ref >60)
GFR calc non Af Amer: >60 mL/min (ref >60)
Glucose, Bld: 195 mg/dL — ABNORMAL HIGH (ref 70–99)
Potassium: 3.5 mmol/L (ref 3.5–5.1)
Sodium: 132 mmol/L — ABNORMAL LOW (ref 135–145)

## 2020-03-11 LAB — GLUCOSE, CAPILLARY
Glucose-Capillary: 163 mg/dL — ABNORMAL HIGH (ref 70–99)
Glucose-Capillary: 184 mg/dL — ABNORMAL HIGH (ref 70–99)
Glucose-Capillary: 188 mg/dL — ABNORMAL HIGH (ref 70–99)
Glucose-Capillary: 214 mg/dL — ABNORMAL HIGH (ref 70–99)
Glucose-Capillary: 251 mg/dL — ABNORMAL HIGH (ref 70–99)
Glucose-Capillary: 276 mg/dL — ABNORMAL HIGH (ref 70–99)
Glucose-Capillary: 299 mg/dL — ABNORMAL HIGH (ref 70–99)

## 2020-03-11 LAB — MAGNESIUM: Magnesium: 1.4 mg/dL — ABNORMAL LOW (ref 1.7–2.4)

## 2020-03-11 MED ORDER — POTASSIUM CHLORIDE 20 MEQ PO PACK
40.0000 meq | PACK | Freq: Once | ORAL | Status: AC
  Administered 2020-03-11: 40 meq via ORAL
  Filled 2020-03-11: qty 2

## 2020-03-11 NOTE — Progress Notes (Signed)
Pts CBG 251. Pt refused 8 units insulin coverage. Will continue to monitor.
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
## 2020-03-11 NOTE — Progress Notes (Signed)
Pt refusing night meds at this time. Will continue to monitor.

## 2020-03-12 LAB — BASIC METABOLIC PANEL
Anion gap: 10 (ref 5–15)
BUN: 10 mg/dL (ref 8–23)
CO2: 27 mmol/L (ref 22–32)
Calcium: 8.7 mg/dL — ABNORMAL LOW (ref 8.9–10.3)
Chloride: 91 mmol/L — ABNORMAL LOW (ref 98–111)
Creatinine, Ser: 0.73 mg/dL (ref 0.44–1.00)
GFR calc Af Amer: >60 mL/min (ref >60)
GFR calc non Af Amer: >60 mL/min (ref >60)
Glucose, Bld: 309 mg/dL — ABNORMAL HIGH (ref 70–99)
Potassium: 3.2 mmol/L — ABNORMAL LOW (ref 3.5–5.1)
Sodium: 128 mmol/L — ABNORMAL LOW (ref 135–145)

## 2020-03-12 LAB — PHOSPHORUS: Phosphorus: 1.6 mg/dL — ABNORMAL LOW (ref 2.5–4.6)

## 2020-03-12 LAB — MAGNESIUM: Magnesium: 1.3 mg/dL — ABNORMAL LOW (ref 1.7–2.4)

## 2020-03-12 LAB — CBC
HCT: 33.7 % — ABNORMAL LOW (ref 36.0–46.0)
Hemoglobin: 10.9 g/dL — ABNORMAL LOW (ref 12.0–15.0)
MCH: 24 pg — ABNORMAL LOW (ref 26.0–34.0)
MCHC: 32.3 g/dL (ref 30.0–36.0)
MCV: 74.1 fL — ABNORMAL LOW (ref 80.0–100.0)
Platelets: 248 10*3/uL (ref 150–400)
RBC: 4.55 MIL/uL (ref 3.87–5.11)
RDW: 14.4 % (ref 11.5–15.5)
WBC: 11.4 10*3/uL — ABNORMAL HIGH (ref 4.0–10.5)
nRBC: 0.2 % (ref 0.0–0.2)

## 2020-03-12 LAB — GLUCOSE, CAPILLARY
Glucose-Capillary: 319 mg/dL — ABNORMAL HIGH (ref 70–99)
Glucose-Capillary: 117 mg/dL — ABNORMAL HIGH (ref 70–99)
Glucose-Capillary: 219 mg/dL — ABNORMAL HIGH (ref 70–99)
Glucose-Capillary: 209 mg/dL — ABNORMAL HIGH (ref 70–99)

## 2020-03-12 MED ORDER — MAGNESIUM SULFATE 2 GM/50ML IV SOLN
2.0000 g | Freq: Once | INTRAVENOUS | Status: AC
  Administered 2020-03-12: 2 g via INTRAVENOUS
  Filled 2020-03-12: qty 50

## 2020-03-12 MED ORDER — INSULIN ASPART 100 UNIT/ML ~~LOC~~ SOLN
0.0000 [IU] | Freq: Three times a day (TID) | SUBCUTANEOUS | Status: DC
  Administered 2020-03-13: 8 [IU] via SUBCUTANEOUS
  Administered 2020-03-13: 15 [IU] via SUBCUTANEOUS
  Administered 2020-03-14: 5 [IU] via SUBCUTANEOUS
  Administered 2020-03-14: 3 [IU] via SUBCUTANEOUS
  Administered 2020-03-14: 5 [IU] via SUBCUTANEOUS
  Administered 2020-03-15 – 2020-03-16 (×2): 3 [IU] via SUBCUTANEOUS
  Administered 2020-03-16: 5 [IU] via SUBCUTANEOUS
  Administered 2020-03-16: 3 [IU] via SUBCUTANEOUS
  Administered 2020-03-17: 8 [IU] via SUBCUTANEOUS
  Administered 2020-03-17 (×2): 3 [IU] via SUBCUTANEOUS
  Administered 2020-03-18: 8 [IU] via SUBCUTANEOUS
  Administered 2020-03-18 (×2): 5 [IU] via SUBCUTANEOUS
  Administered 2020-03-19: 3 [IU] via SUBCUTANEOUS
  Administered 2020-03-19: 2 [IU] via SUBCUTANEOUS
  Administered 2020-03-19: 8 [IU] via SUBCUTANEOUS
  Administered 2020-03-20 (×3): 5 [IU] via SUBCUTANEOUS
  Administered 2020-03-21: 2 [IU] via SUBCUTANEOUS
  Administered 2020-03-21: 3 [IU] via SUBCUTANEOUS
  Administered 2020-03-22: 2 [IU] via SUBCUTANEOUS
  Administered 2020-03-22: 3 [IU] via SUBCUTANEOUS
  Administered 2020-03-22 – 2020-03-23 (×2): 2 [IU] via SUBCUTANEOUS
  Administered 2020-03-23: 5 [IU] via SUBCUTANEOUS
  Administered 2020-03-25: 3 [IU] via SUBCUTANEOUS
  Administered 2020-03-26 – 2020-03-27 (×3): 2 [IU] via SUBCUTANEOUS
  Administered 2020-03-28: 3 [IU] via SUBCUTANEOUS

## 2020-03-12 MED ORDER — POTASSIUM CHLORIDE 20 MEQ PO PACK
40.0000 meq | PACK | Freq: Once | ORAL | Status: AC
  Administered 2020-03-12: 08:00:00 40 meq via ORAL
  Filled 2020-03-12: qty 2

## 2020-03-12 MED ORDER — INSULIN ASPART 100 UNIT/ML ~~LOC~~ SOLN
0.0000 [IU] | Freq: Every day | SUBCUTANEOUS | Status: DC
  Administered 2020-03-12 – 2020-03-16 (×4): 2 [IU] via SUBCUTANEOUS
  Administered 2020-03-17: 3 [IU] via SUBCUTANEOUS
  Administered 2020-03-18 – 2020-03-20 (×2): 2 [IU] via SUBCUTANEOUS

## 2020-03-12 MED ORDER — K PHOS MONO-SOD PHOS DI & MONO 155-852-130 MG PO TABS
250.0000 mg | ORAL_TABLET | Freq: Three times a day (TID) | ORAL | Status: AC
  Administered 2020-03-12 – 2020-03-13 (×6): 250 mg via ORAL
  Filled 2020-03-12 (×6): qty 1

## 2020-03-12 MED ORDER — MAGIC MOUTHWASH W/LIDOCAINE
5.0000 mL | Freq: Three times a day (TID) | ORAL | Status: DC | PRN
  Filled 2020-03-12: qty 5

## 2020-03-12 NOTE — Progress Notes (Signed)
SLP Cancellation Note  Patient Details Name: Yvonne [name] MRN: [id number] DOB: [birth year]   Cancelled treatment:       Reason Eval/Treat Not Completed: Patient declined, no reason specified(Pt was approached for treatment but reported to RN that she would like to rest at this time. SLP will follow up on subsequent date.)  Shavy Beachem I. Hardin Negus, [place], [place] Office number [phone] Pager [phone]  Horton Marshall 03/12/2020, 5:38 PM
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
## 2020-03-13 LAB — COMPREHENSIVE METABOLIC PANEL
ALT: 16 U/L (ref 0–44)
AST: 24 U/L (ref 15–41)
Albumin: 2.4 g/dL — ABNORMAL LOW (ref 3.5–5.0)
Alkaline Phosphatase: 82 U/L (ref 38–126)
Anion gap: 11 (ref 5–15)
BUN: 11 mg/dL (ref 8–23)
CO2: 27 mmol/L (ref 22–32)
Calcium: 8.9 mg/dL (ref 8.9–10.3)
Chloride: 93 mmol/L — ABNORMAL LOW (ref 98–111)
Creatinine, Ser: 0.78 mg/dL (ref 0.44–1.00)
GFR calc Af Amer: >60 mL/min (ref >60)
GFR calc non Af Amer: >60 mL/min (ref >60)
Glucose, Bld: 327 mg/dL — ABNORMAL HIGH (ref 70–99)
Potassium: 3.2 mmol/L — ABNORMAL LOW (ref 3.5–5.1)
Sodium: 131 mmol/L — ABNORMAL LOW (ref 135–145)
Total Bilirubin: 0.3 mg/dL (ref 0.3–1.2)
Total Protein: 5.8 g/dL — ABNORMAL LOW (ref 6.5–8.1)

## 2020-03-13 LAB — CBC
HCT: 33.4 % — ABNORMAL LOW (ref 36.0–46.0)
Hemoglobin: 11 g/dL — ABNORMAL LOW (ref 12.0–15.0)
MCH: 23.7 pg — ABNORMAL LOW (ref 26.0–34.0)
MCHC: 32.9 g/dL (ref 30.0–36.0)
MCV: 71.8 fL — ABNORMAL LOW (ref 80.0–100.0)
Platelets: 262 10*3/uL (ref 150–400)
RBC: 4.65 MIL/uL (ref 3.87–5.11)
RDW: 13.8 % (ref 11.5–15.5)
WBC: 9.9 10*3/uL (ref 4.0–10.5)
nRBC: 0.2 % (ref 0.0–0.2)

## 2020-03-13 LAB — GLUCOSE, CAPILLARY
Glucose-Capillary: 380 mg/dL — ABNORMAL HIGH (ref 70–99)
Glucose-Capillary: 115 mg/dL — ABNORMAL HIGH (ref 70–99)
Glucose-Capillary: 267 mg/dL — ABNORMAL HIGH (ref 70–99)
Glucose-Capillary: 168 mg/dL — ABNORMAL HIGH (ref 70–99)

## 2020-03-13 LAB — MAGNESIUM: Magnesium: 1.6 mg/dL — ABNORMAL LOW (ref 1.7–2.4)

## 2020-03-13 LAB — PHOSPHORUS: Phosphorus: 1.7 mg/dL — ABNORMAL LOW (ref 2.5–4.6)

## 2020-03-13 MED ORDER — INSULIN GLARGINE 100 UNIT/ML ~~LOC~~ SOLN
15.0000 [IU] | Freq: Every day | SUBCUTANEOUS | Status: DC
  Administered 2020-03-13 – 2020-03-17 (×5): 15 [IU] via SUBCUTANEOUS
  Filled 2020-03-13 (×8): qty 0.15

## 2020-03-13 MED ORDER — GERHARDT'S BUTT CREAM
TOPICAL_CREAM | Freq: Every day | CUTANEOUS | Status: DC | PRN
  Administered 2020-03-14: 1 via TOPICAL
  Filled 2020-03-13: qty 1

## 2020-03-13 MED ORDER — POTASSIUM CHLORIDE CRYS ER 20 MEQ PO TBCR
40.0000 meq | EXTENDED_RELEASE_TABLET | Freq: Once | ORAL | Status: AC
  Administered 2020-03-13: 40 meq via ORAL
  Filled 2020-03-13: qty 2

## 2020-03-13 MED ORDER — INSULIN GLARGINE 100 UNIT/ML ~~LOC~~ SOLN
10.0000 [IU] | Freq: Once | SUBCUTANEOUS | Status: AC
  Administered 2020-03-13: 12:00:00 10 [IU] via SUBCUTANEOUS
  Filled 2020-03-13: qty 0.1

## 2020-03-13 MED ORDER — MAGNESIUM OXIDE 400 (241.3 MG) MG PO TABS
400.0000 mg | ORAL_TABLET | Freq: Two times a day (BID) | ORAL | Status: AC
  Administered 2020-03-13 – 2020-03-15 (×6): 400 mg via ORAL
  Filled 2020-03-13 (×6): qty 1

## 2020-03-13 NOTE — Progress Notes (Addendum)
PROGRESS NOTE   .trh  Yvonne [name]  [id number] DOB: 1946-09-01 DOA: 02/27/2020 PCP: Tresa Garter, MD   Brief Narrative: 74 year old female with dementia, DM 2, COPD, CHF, hyperlipidemia presented to hospital on 02/27/20 with altered mental status, high blood sugar, hematemesis.  Patient's daughter-in-law found her at home vomiting some blood.  Patient was seen in the ER and was found to have significant electrolyte imbalance with lactic acidosis, hyponatremia hyperkalemia, DKA, UTI, diabetic foot ulcer/cellulitis.  Patient has not been compliant with her insulin regimen, and family has been out of touch for few months-it appears patient has been noncompliant with medication, and also with complicated and fractured family dynamics.  In ER, CT head was negative for acute finding, chest x-ray unremarkable, MRI of the lower extremities no evidence of cellulitis EKG prolonged QTC. Patient was seen by GI underwent barium swallow that showed delayed passage of contrast at the GE junction and distal esophagus radiology concerned about pseudoachalasia and subsequently underwent  EGD 03/05/2020-found to have extensive esophagitis, gastritis and duodenitis.Biopsies were taken. No mass was seen at the GE junction. No dilatation was performed for the subtle mild stricture at GEJ due to the severe esophagitis. Recommendations are for PPI and sucralfate. The patient has been started on a clear liquid diet. The patient's DIL [name] stated that if the patient does not improve her PO intake, that she would be interested in hospice. DIL [name] has stated that both she and [name] are interested in a feeding tube. The patient no longer wants to talk to any of her family members about it and does not want feeding tube.    Subjective:  Alert,awake.Overnight afebrile. On RA. Oriented to self. Doesn't know how she ended up here. Follows commands. Not much interested in conversation. Does not want me to call  her family. Blood sugar  117, 219, 209, 380-able to eat 25 to 50% of her meal. mag 1.6. No new complaints. Was confused earlier this morning.   Assessment & Plan:  DKA/AGMA/Metabolic alkalosis:Resolved.Felt to be due to UTI/diabetic foot wound/noncompliance  Type 2 diabetes mellitus poorly controlled, with insulin noncompliance.  Hemoglobin A1c 10.1.  Home Lantus on hold, on sliding scale insulin.  Sugar is poorly controlled, oral intake is picking up, needing 17 to 22 units/day last 3 days, will place on Lantus starting now x1 and then 15 units bedtime. Monitor and adjust insulin. Recent Labs  Lab 03/12/20 0434 03/12/20 1136 03/12/20 1636 03/12/20 2041 03/13/20 0630  GLUCAP 319* 117* 219* 209* 380*   Hematemesis/extensive esophagitis, gastritis and duodenitis/nausea vomiting: Nausea vomiting resolved.  Seen by GI, underwent EGD, on PPI.  Anorexia in the setting of uncontrolled diabetes, esophagitis gastritis-started on Megace, encourage oral intake.  Does not desire to have a feeding tube.  Foot wound diabetic versus vascular: on Lt Grt toe. healing well. Cont wound care. blood culture no growth. Monitor, cont wound care. Check ABI on left.  Suspected UTI, with abnormal UA on admission treated with IV antibiotics cultures no growth.  Off antibiotics.  Hypokalemia/hypomagnesemia/hypophosphatemia- monitor and replete with oral supplementation as ordered.  History of COPD: On room air and stable.  Chronic pain on Neurontin  Anxiety/insomnia on low-dose as needed Xanax  History of CHF last ejection thousand 3 with chronic diastolic disease,on IV Lasix . Weight on 4 02/24/2026 then 185 pounds since then holding it 187 stable. Hold lasix for now.  Hyponatremia in the setting of hyperglycemia, sodium improving.  Hypocalcemia: Resolved.   Noncompliance-counselled  Morbid  obesity with BMI 34: Will definitely benefit with weight loss and healthy lifestyle but notes at home is  feasible in the current setting.  DVT prophylaxis:SCD Code Status:DNR Family Communication: plan of care discussed with patient at bedside. Does not want [name] to call the family. Status is: Inpatient  Remains inpatient appropriate because:Persistent severe electrolyte disturbances and Inpatient level of care appropriate due to severity of illness   Dispo: The patient is from: Home              Anticipated d/c is to: CIR              Anticipated d/c date is: 1 day              Patient currently is medically stable to d/c.  Nutrition: Diet Order            DIET DYS 3 Room service appropriate? Yes with Assist; Fluid consistency: Thin  Diet effective now              Nutrition Problem: Inadequate oral intake Etiology: poor appetite Signs/Symptoms: meal completion < 25% Interventions: Boost Breeze, MVI Body mass index is 34.27 kg/m.  Consultants: GI Procedures:egd Microbiology:see note  Medications: Scheduled Meds: . DULoxetine  30 mg Oral Daily  . feeding supplement (ENSURE ENLIVE)  237 mL Oral TID BM  . furosemide  20 mg Intravenous Q12H  . gabapentin  100 mg Oral TID  . insulin aspart  0-15 Units Subcutaneous TID WC  . insulin aspart  0-5 Units Subcutaneous QHS  . megestrol  200 mg Oral BID  . multivitamin with minerals  1 tablet Oral Daily  . nystatin  5 mL Oral QID  . pantoprazole (PROTONIX) IV  40 mg Intravenous Q12H  . phosphorus  250 mg Oral TID  . sodium chloride flush  10-40 mL Intracatheter Q12H  . sodium chloride  2 g Oral BID WC  . sucralfate  1 g Oral Q6H   Continuous Infusions: . sodium chloride 250 mL/hr at 03/08/20 0957    Antimicrobials: Anti-infectives (From admission, onward)   Start     Dose/Rate Route Frequency Ordered Stop   02/28/20 0030  cefTRIAXone (ROCEPHIN) 1 g in sodium chloride 0.9 % 100 mL IVPB  Status:  Discontinued     1 g 200 mL/hr over 30 Minutes Intravenous Every 24 hours 02/28/20 0016 03/03/20 1304        Objective: Vitals: Today's Vitals   03/13/20 0309 03/13/20 0436 03/13/20 0513 03/13/20 0738  BP:  114/68  108/70  Pulse:  96    Resp: 17 15  15   Temp:  98.7 F (37.1 C)  98.5 F (36.9 C)  TempSrc:  Oral  Oral  SpO2:  97%    Weight:   85 kg   Height:      PainSc: Asleep       Intake/Output Summary (Last 24 hours) at 03/13/2020 1053 Last data filed at 03/13/2020 0156 Gross per 24 hour  Intake 717 ml  Output 800 ml  Net -83 ml   Filed Weights   03/11/20 0303 03/12/20 0350 03/13/20 0513  Weight: 85.3 kg 85 kg 85 kg   Weight change: 0 kg   Intake/Output from previous day: 04/18 0701 - 04/19 0700 In: 1016 [P.O.:966; IV Piggyback:50] Out: 1600 [Urine:1600] Intake/Output this shift: No intake/output data recorded.  Examination:  General exam: AAO, NAD,weak appearing. HEENT:Oral mucosa moist,Ear/Nose WNL grossly,dentition normal. Respiratory system: bilaterally clear, on RA ,no wheezing or crackles,no  use of accessory muscle, non tender. Cardiovascular system: S1 & S2 +, regular, No JVD. Gastrointestinal system: Abdomen soft, NT,ND, BS+. Nervous System:Alert, awake, moving extremities and grossly nonfocal Extremities: No edema, distal peripheral pulses palpable.  Skin: No rashes,no icterus. MSK: Normal muscle bulk,tone, power  Data Reviewed: I have personally reviewed following labs and imaging studies CBC: Recent Labs  Lab 03/10/20 0205 03/12/20 0450 03/13/20 0214  WBC 11.8* 11.4* 9.9  HGB 11.7* 10.9* 11.0*  HCT 35.8* 33.7* 33.4*  MCV 71.6* 74.1* 71.8*  PLT 255 248 [id number]   Basic Metabolic Panel: Recent Labs  Lab 03/08/20 0411 03/08/20 1344 03/08/20 2013 03/08/20 2013 03/09/20 0227 03/10/20 0205 03/11/20 1326 03/12/20 0450 03/13/20 0214  NA   < >  --  129*   < > 132* 132* 132* 128* 131*  K   < >  --  3.4*   < > 3.2* 3.3* 3.5 3.2* 3.2*  CL   < >  --  94*   < > 94* 95* 96* 91* 93*  CO2   < >  --  23   < > 24 25 25 27 27   GLUCOSE   < >  --  198*    < > 71 185* 195* 309* 327*  BUN   < >  --  8   < > 9 8 8 10 11   CREATININE   < >  --  0.87   < > 0.85 0.94 0.66 0.73 0.78  CALCIUM   < >  --  8.2*   < > 8.2* 8.6* 8.7* 8.7* 8.9  MG   < > 1.3* 1.7   < > 2.7* 1.7 1.4* 1.3* 1.6*  PHOS  --  2.1* 2.3*  --   --  2.6  --  1.6* 1.7*   < > = values in this interval not displayed.   GFR: Estimated Creatinine Clearance: 61.5 mL/min (by C-G formula based on SCr of 0.78 mg/dL). Liver Function Tests: Recent Labs  Lab 03/13/20 0214  AST 24  ALT 16  ALKPHOS 82  BILITOT 0.3  PROT 5.8*  ALBUMIN 2.4*   No results for input(s): LIPASE, AMYLASE in the last 168 hours. No results for input(s): AMMONIA in the last 168 hours. Coagulation Profile: No results for input(s): INR, PROTIME in the last 168 hours. Cardiac Enzymes: No results for input(s): CKTOTAL, CKMB, CKMBINDEX, TROPONINI in the last 168 hours. BNP (last 3 results) No results for input(s): PROBNP in the last 8760 hours. HbA1C: No results for input(s): HGBA1C in the last 72 hours. CBG: Recent Labs  Lab 03/12/20 0434 03/12/20 1136 03/12/20 1636 03/12/20 2041 03/13/20 0630  GLUCAP 319* 117* 219* 209* 380*   Lipid Profile: No results for input(s): CHOL, HDL, LDLCALC, TRIG, CHOLHDL, LDLDIRECT in the last 72 hours. Thyroid Function Tests: No results for input(s): TSH, T4TOTAL, FREET4, T3FREE, THYROIDAB in the last 72 hours. Anemia Panel: No results for input(s): VITAMINB12, FOLATE, FERRITIN, TIBC, IRON, RETICCTPCT in the last 72 hours. Sepsis Labs: No results for input(s): PROCALCITON, LATICACIDVEN in the last 168 hours.  No results found for this or any previous visit (from the past 240 hour(s)).    Radiology Studies: No results found.   LOS: 14 days   Time spent: More than 50% of that time was spent in counseling and/or coordination of care.  Antonieta Pert, MD [facility]  03/13/2020, 10:53 AM

## 2020-03-13 NOTE — Progress Notes (Signed)
Occupational Therapy Treatment Patient Details Name: Yvonne [name] MRN: [id number] DOB: 18-May-1946 Today's Date: 03/13/2020    History of present illness Pt is a 74 yo female s/p UTI, diabetic foot ulcer, DKA and cellulitis. EGD on 4/11 revealing esophagitis, gastritis, and duodenitis. PMHx: dementia, T2DM, COPD, CHF,    OT comments  Pt presents supine in bed agreeable to OT treatment session. Pt requiring up to maxA for bed mobility, once positioned EOB pt tolerating sitting for approx 5-7 min during completion of grooming ADL tasks, completing at minguard assist level. Pt notably confused during session thinking we were at her home and asking where certain individuals were, where her dog was. Pt fatigued with EOB activity and initiating return to supine. Max HR noted 121 with seated activity. Continue to recommend SNF level therapies at time of discharge. Will follow while acutely admitted.    Follow Up Recommendations  SNF;Supervision/Assistance - 24 hour    Equipment Recommendations  Other (comment)(defer to next venue)          Precautions / Restrictions Precautions Precautions: Fall;Other (comment) Precaution Comments: L shoulder poor mobility; incontinent Restrictions Weight Bearing Restrictions: No       Mobility Bed Mobility Overal bed mobility: Needs Assistance Bed Mobility: Supine to Sit;Sit to Supine     Supine to sit: Mod assist Sit to supine: Max assist   General bed mobility comments: assist for trunk elevation, LEs back onto EOB; max cues when returning to supine as pt attempting to lay her head towards the foot of the bed   Transfers                 General transfer comment: deferred, pt too fatigued and attempting to lay back down    Balance Overall balance assessment: Needs assistance Sitting-balance support: Feet supported;Bilateral upper extremity supported Sitting balance-Leahy Scale: Fair Sitting balance - Comments: close minguard for  safety                                   ADL either performed or assessed with clinical judgement   ADL Overall ADL's : Needs assistance/impaired Eating/Feeding: Set up;Sitting Eating/Feeding Details (indicate cue type and reason): pt completing eating lunch upon arrival Grooming: Wash/dry face;Wash/dry hands;Brushing hair;Set up;Min guard;Sitting Grooming Details (indicate cue type and reason): minguard assist for balance sitting EOB                               General ADL Comments: pt tolerated sitting EOB approx 5-7 min for ADL completion, fatigues easily and requesting return to supine                       Cognition Arousal/Alertness: Awake/alert Behavior During Therapy: Flat affect Overall Cognitive Status: History of cognitive impairments - at baseline                                 General Comments: pt with hx of dementia per chart, attempting to lay back down with head towards foot of bed and didn't understand why she couldn't, thought we were at her home, asking me to take laundry to specific room, asking where family members/her dog were, asking where her cigarettes are         Exercises Exercises: General Lower Extremity General  Exercises - Lower Extremity Long Arc Quad: AROM;Both;Seated;10 reps Hip Flexion/Marching: AROM;Both;Seated;10 reps   Shoulder Instructions       General Comments max HR noted 121 with seated activity, in the 100s at rest in supine    Pertinent Vitals/ Pain       Pain Assessment: Faces Faces Pain Scale: No hurt  Home Living                                          Prior Functioning/Environment              Frequency  Min 2X/week        Progress Toward Goals  OT Goals(current goals can now be found in the care plan section)  Progress towards OT goals: Progressing toward goals  Acute Rehab OT Goals Patient Stated Goal: to go home OT Goal Formulation:  With patient Time For Goal Achievement: 03/22/20 Potential to Achieve Goals: Good ADL Goals Pt Will Perform Grooming: with min guard assist;standing Pt Will Perform Lower Body Dressing: with min assist;sitting/lateral leans;sit to/from stand Pt Will Transfer to Toilet: with mod assist;stand pivot transfer;bedside commode Additional ADL Goal #1: Pt will demonstrate selective attention duirng ADL task in minimally distracting environment with minimal cues. Additional ADL Goal #2: Pt will participate in 10 mins of ADL tasks with minA overall with 2 rest breaks.  Plan Discharge plan remains appropriate    Co-evaluation                 AM-PAC OT "6 Clicks" Daily Activity     Outcome Measure   Help from another person eating meals?: A Little Help from another person taking care of personal grooming?: A Little Help from another person toileting, which includes using toliet, bedpan, or urinal?: A Lot Help from another person bathing (including washing, rinsing, drying)?: A Lot Help from another person to put on and taking off regular upper body clothing?: A Lot Help from another person to put on and taking off regular lower body clothing?: A Lot 6 Click Score: 14    End of Session    OT Visit Diagnosis: Unsteadiness on feet (R26.81);Pain;Other symptoms and signs involving cognitive function Pain - Right/Left: Left Pain - part of body: Shoulder   Activity Tolerance Patient tolerated treatment well;Patient limited by fatigue   Patient Left in bed;with call bell/phone within reach;with bed alarm set   Nurse Communication Mobility status        Time: [id number] OT Time Calculation (min): 23 min  Charges: OT General Charges $OT Visit: 1 Visit OT Treatments $Self Care/Home Management : 23-37 mins  Lou Cal, OT Acute Rehabilitation Services Pager [phone] Office [place] 03/13/2020, 4:46 PM

## 2020-03-13 NOTE — Plan of Care (Signed)

## 2020-03-13 NOTE — Progress Notes (Signed)
Daily Progress Note   Patient Name: Yvonne [name]       Date: 03/13/2020 DOB: 10-26-1946  Age: 74 y.o. MRN#: [id number] Attending Physician: Antonieta Pert, MD Primary Care Physician: Tresa Garter, MD Admit Date: 02/27/2020  Reason for Consultation/Follow-up: Establishing goals of care  Subjective: Patient awake and sitting upright in bed eating lunch. States her mashed potatoes were good and she could eat some more, also asking for another ginger-ale. Denies pain or shortness of breath. States she is feeling much better. Asking when she would be released from the hospital.   I reviewed previous goals of care discussion. Ms. Brigham was able to recall our discussion confirming her continued wishes for DNR and no peg. She states "I have not heard from any of my family in the past week. That is just like them and what they do. I don't want them called either, but was going to see if anyone was going to call me." Support given. I offered to call her son [name] and provide updates, however patient has made it clear she did not want her family to be called.   Goals remain clear and set. Patient in agreement with outpatient palliative once discharged which will be important for her to have for continued support.   Length of Stay: 14  Current Medications: Scheduled Meds:  . DULoxetine  30 mg Oral Daily  . feeding supplement (ENSURE ENLIVE)  237 mL Oral TID BM  . gabapentin  100 mg Oral TID  . insulin aspart  0-15 Units Subcutaneous TID WC  . insulin aspart  0-5 Units Subcutaneous QHS  . insulin glargine  15 Units Subcutaneous QHS  . magnesium oxide  400 mg Oral BID  . megestrol  200 mg Oral BID  . multivitamin with minerals  1 tablet Oral Daily  . nystatin  5 mL Oral QID  . pantoprazole  (PROTONIX) IV  40 mg Intravenous Q12H  . phosphorus  250 mg Oral TID  . sodium chloride flush  10-40 mL Intracatheter Q12H  . sodium chloride  2 g Oral BID WC  . sucralfate  1 g Oral Q6H    Continuous Infusions: . sodium chloride 250 mL/hr at 03/08/20 0957    PRN Meds: ALPRAZolam, magic mouthwash w/lidocaine, ondansetron (ZOFRAN) IV, prochlorperazine, sodium chloride flush  Physical  Exam Vitals reviewed.   -NAD, ill-appearing -RRR -diminished bilaterally -AAO, mood appropriate, follows commands         Vital Signs: BP 98/74 (BP Location: Right Arm)   Pulse 90   Temp 98.4 F (36.9 C) (Oral)   Resp 15   Ht 5\' 2"  (1.575 m)   Wt 85 kg   SpO2 97%   BMI 34.27 kg/m  SpO2: SpO2: 97 % O2 Device: O2 Device: Room Air O2 Flow Rate:    Intake/output summary:   Intake/Output Summary (Last 24 hours) at 03/13/2020 1234 Last data filed at 03/13/2020 0156 Gross per 24 hour  Intake 717 ml  Output 800 ml  Net -83 ml   LBM: Last BM Date: 03/08/20 Baseline Weight: Weight: 103 kg Most recent weight: Weight: 85 kg       Palliative Assessment/Data:PPS 30%      Patient Active Problem List   Diagnosis Date Noted  . Dysphagia   . Hematemesis with nausea   . DKA (diabetic ketoacidoses) ([place]) 02/28/2020  . Altered mental status   . Dehydration   . Hypokalemia   . Diabetic neuropathy, painful ([place]) 07/20/2015  . Insomnia due to anxiety and fear 07/20/2015  . Anxiety disorder 10/03/2014  . Preventative health care 10/03/2014  . Type 2 diabetes mellitus without complication ([place]) 0000000  . Depression 06/23/2014  . Essential hypertension 06/23/2014  . Peripheral neuropathic pain 06/23/2014  . Gastroesophageal reflux disease without esophagitis 06/23/2014  . Dyslipidemia 06/23/2014  . Chronic neck pain 03/09/2012  . Right arm pain 03/09/2012  . Chronic low back pain 03/09/2012  . Right knee pain 03/09/2012  . Joint stiffness of hand 03/09/2012  . Encephalopathy  02/05/2012  . Hypoxia 02/01/2012  . Fever 02/01/2012  . Leukocytosis 02/01/2012  . Bronchitis 02/01/2012  . Confusion 02/01/2012  . Diarrhea 02/01/2012  . COPD (chronic obstructive pulmonary disease) ([place]) 02/01/2012  . CAP (community acquired pneumonia) 02/01/2012  . Diabetes mellitus [facility])     Palliative Care Assessment & Plan    Recommendations/Plan: DNR Continue current plan of care per medical team Patient continues to request not to notify family or provide updates Goals are set and clear for no artificial feeding/PEG Outpatient palliative at discharge.  PMT will continue to shadow for further needs or need to re-engage if further decline.   Goals of Care and Additional Recommendations: Limitations on Scope of Treatment: No Artificial Feeding and DNR  Code Status:    Code Status Orders  (From admission, onward)         Start     Ordered   03/09/20 1042  Do not attempt resuscitation (DNR)  Continuous    Question Answer Comment  In the event of cardiac or respiratory ARREST Do not call a "code blue"   In the event of cardiac or respiratory ARREST Do not perform Intubation, CPR, defibrillation or ACLS   In the event of cardiac or respiratory ARREST Use medication by any route, position, wound care, and other measures to relive pain and suffering. May use oxygen, suction and manual treatment of airway obstruction as needed for comfort.      03/09/20 1041        Code Status History    Date Active Date Inactive Code Status Order ID Comments User Context   02/28/2020 0217 03/09/2020 1041 Full Code [id number]  Sid Falcon, MD ED   02/01/2012 2314 02/05/2012 2104 Full Code [id number]  Tresa Endo, RN Inpatient   Advance Care  Planning Activity      Prognosis: Guarded   Discharge Planning: To Be Determined with outpatient palliative    Thank you for allowing the Palliative Medicine Team to assist in the care of this patient.  Time Total: 25 min.   Visit  consisted of counseling and education dealing with the complex and emotionally intense issues of symptom management and palliative care in the setting of serious and potentially life-threatening illness.Greater than 50%  of this time was spent counseling and coordinating care related to the above assessment and plan.  Alda Lea, AGPCNP-BC  Palliative Medicine Team [phone]    Please contact Palliative Medicine Team phone at [phone] for questions and concerns.

## 2020-03-13 NOTE — Progress Notes (Addendum)
Update: Vincente Liberty called back and reports she is doing research to see if patient has POA, as when she talked with patient patient was not fully oriented and noted in chart patient is only oriented to person. Vincente Liberty reports she will be speaking with family 4/20 to identify who decision maker is due to difficult family dynamics, and let CSW know to aide in discharge planning.   CSW received vm from APS worker Adrienne at [phone], [place] called her back no answer, lvm.   TOC team to continue to follow for discharge planning needs.   [place], [place]

## 2020-03-14 ENCOUNTER — Inpatient Hospital Stay (HOSPITAL_COMMUNITY): Payer: Medicare Other

## 2020-03-14 DIAGNOSIS — I70201 Unspecified atherosclerosis of native arteries of extremities, right leg: Secondary | ICD-10-CM

## 2020-03-14 DIAGNOSIS — L0390 Cellulitis, unspecified: Secondary | ICD-10-CM

## 2020-03-14 LAB — GLUCOSE, CAPILLARY
Glucose-Capillary: 201 mg/dL — ABNORMAL HIGH (ref 70–99)
Glucose-Capillary: 235 mg/dL — ABNORMAL HIGH (ref 70–99)
Glucose-Capillary: 188 mg/dL — ABNORMAL HIGH (ref 70–99)
Glucose-Capillary: 242 mg/dL — ABNORMAL HIGH (ref 70–99)

## 2020-03-14 LAB — BASIC METABOLIC PANEL
Anion gap: 10 (ref 5–15)
BUN: 13 mg/dL (ref 8–23)
CO2: 27 mmol/L (ref 22–32)
Calcium: 8.8 mg/dL — ABNORMAL LOW (ref 8.9–10.3)
Chloride: 92 mmol/L — ABNORMAL LOW (ref 98–111)
Creatinine, Ser: 0.81 mg/dL (ref 0.44–1.00)
GFR calc Af Amer: >60 mL/min (ref >60)
GFR calc non Af Amer: >60 mL/min (ref >60)
Glucose, Bld: 282 mg/dL — ABNORMAL HIGH (ref 70–99)
Potassium: 3.4 mmol/L — ABNORMAL LOW (ref 3.5–5.1)
Sodium: 129 mmol/L — ABNORMAL LOW (ref 135–145)

## 2020-03-14 MED ORDER — POTASSIUM CHLORIDE CRYS ER 20 MEQ PO TBCR
20.0000 meq | EXTENDED_RELEASE_TABLET | Freq: Every day | ORAL | Status: DC
  Administered 2020-03-14 – 2020-03-17 (×4): 20 meq via ORAL
  Filled 2020-03-14 (×4): qty 1

## 2020-03-14 NOTE — Plan of Care (Signed)
  Problem: Education: Goal: Knowledge of General Education information will improve Description: Including pain rating scale, medication(s)/side effects and non-pharmacologic comfort measures Outcome: Progressing   Problem: Health Behavior/Discharge Planning: Goal: Ability to manage health-related needs will improve Outcome: Progressing   Problem: Clinical Measurements: Goal: Ability to maintain clinical measurements within normal limits will improve Outcome: Progressing Goal: Will remain free from infection Outcome: Progressing Goal: Diagnostic test results will improve Outcome: Progressing Goal: Respiratory complications will improve Outcome: Progressing Goal: Cardiovascular complication will be avoided Outcome: Progressing   Problem: Nutrition: Goal: Adequate nutrition will be maintained Outcome: Progressing   Problem: Elimination: Goal: Will not experience complications related to bowel motility Outcome: Progressing Goal: Will not experience complications related to urinary retention Outcome: Progressing   Problem: Safety: Goal: Ability to remain free from injury will improve Outcome: Progressing

## 2020-03-14 NOTE — Progress Notes (Addendum)
RN called report to Darylene Price, Therapist, sports. Gave detailed report of patient. All questions answered. Patient belongings sent with patient in patient belongings bag. VSS at discharge. RN transferred pt to 5MW 11.

## 2020-03-14 NOTE — TOC Progression Note (Signed)
Transition of Care [facility]) - Progression Note    Patient Details  Name: LAZELLE ORROCK MRN: [id number] Date of Birth: 1946/03/23  [facility] CM/SW Contact  Zenon Mayo, RN Phone Number: 03/14/2020, 3:16 PM  Clinical Narrative:    NCM called lvm with   Adrienne at [phone]  with APS to return call.  Adrenne was to speak with family today to identify who decision maker is due to difficult family dynamics to aide in discharge planning.         Expected Discharge Plan and Services                                                 Social Determinants of Health (SDOH) Interventions    Readmission Risk Interventions No flowsheet data found.

## 2020-03-14 NOTE — Progress Notes (Signed)
SLP Cancellation Note  Patient Details Name: Yvonne [name] MRN: [id number] DOB: [birth year]   Cancelled treatment:       Reason Eval/Treat Not Completed: Patient at procedure or test/unavailable. Patient out of room.   Kmari Halter MA, CCC-SLP     Clydene Burack [place] 03/14/2020, 11:20 AM

## 2020-03-14 NOTE — Progress Notes (Signed)
Inpatient Rehabilitation-Admissions Coordinator   Inpatient Rehab Consult received.  I met with pt at the bedside for rehabilitation assessment. AC discussed reason for visit and began with questions about her PLOF. Pt appears to be a poor historian and was not oriented to location or situation. Pt adamantly stated she is not interested in any type of rehab and can "go home whenever she wants." Pt unclear about support system at home at this time. Based on interaction, suspected underlying cognitive deficits, and resistance to therapy noted by PT and OT in the past few visits, AC would agree with therapy recommendations for SNF.   AC will sign off and will contact TOC team regarding recommendation.   Raechel Ache, OTR/L  Rehab Admissions Coordinator  [phone] 03/14/2020 2:16 PM

## 2020-03-14 NOTE — Progress Notes (Signed)
PROGRESS NOTE   .trh  Yvonne [name]  [id number] DOB: [birth year] DOA: 02/27/2020 PCP: Tresa Garter, MD   Brief Narrative: 74 year old female with dementia, DM 2, COPD, CHF, hyperlipidemia presented to hospital on 02/27/20 with altered mental status, high blood sugar, hematemesis.  Patient's daughter-in-law found her at home vomiting some blood.  Patient was seen in the ER and was found to have significant electrolyte imbalance with lactic acidosis, hyponatremia hyperkalemia, DKA, UTI, diabetic foot ulcer/cellulitis.  Patient has not been compliant with her insulin regimen, and family has been out of touch for few months-it appears patient has been noncompliant with medication, and also with complicated and fractured family dynamics.  In ER, CT head was negative for acute finding, chest x-ray unremarkable, MRI of the lower extremities no evidence of cellulitis EKG prolonged QTC. Patient was seen by GI underwent barium swallow that showed delayed passage of contrast at the GE junction and distal esophagus radiology concerned about pseudoachalasia and subsequently underwent  EGD 03/05/2020-found to have extensive esophagitis, gastritis and duodenitis.Biopsies were taken. No mass was seen at the GE junction. No dilatation was performed for the subtle mild stricture at GEJ due to the severe esophagitis. Recommendations are for PPI and sucralfate. The patient has been started on a clear liquid diet. The patient's DIL [name] stated that if the patient does not improve her PO intake, that she would be interested in hospice. DIL [name] has stated that both she and [name] are interested in a feeding tube. The patient no longer wants to talk to any of her family members about it and does not want feeding tube.    Subjective: Seen this morning.  Nursing assistant trying to feed her. No acute events overnight.   No new complaints. k 3.4, blood sugar running in 110s-260s Not interested for  conversation. "  Why do you have to come in I am  About to take medicine and eat'  Assessment & Plan:  DKA/AGMA/Metabolic alkalosis:Resolved.Felt to be due to UTI/diabetic foot wound/noncompliance.  Type 2 diabetes mellitus poorly controlled, with insulin noncompliance.  Hemoglobin A1c 10.1. Cont on Lantus 15 units bedtime (started 4/19), ssi and adjust insulin as oral intake picks up. Recent Labs  Lab 03/13/20 0630 03/13/20 1125 03/13/20 1637 03/13/20 2145 03/14/20 0620  GLUCAP 380* 115* 267* 168* 201*   Hematemesis/extensive esophagitis, gastritis and duodenitis/nausea vomiting: Nausea vomiting resolved.  Seen by GI, underwent EGD, on PPI.  Anorexia in the setting of uncontrolled diabetes, esophagitis gastritis-started on Megace, encourage oral intake.  Does not desire to have a feeding tube.  Foot wound diabetic versus vascular: on Lt Grt toe. healing well. Cont wound care. blood culture no growth. Monitor, cont wound care. Ordered ABI on left-pending.  Suspected UTI, with abnormal UA on admission treated with IV antibiotics cultures no growth.  Off antibiotics.  Hypokalemia/hypomagnesemia/hypophosphatemia- monitor and replete with oral supplementation as ordered-oral potassium 20 daily and mag oxide 400 twice daily.  History of COPD: On room air and stable.  Chronic pain on Neurontin  Anxiety/insomnia on low-dose as needed Xanax  History of CHF last ejection thousand 3 with chronic diastolic disease,on IV Lasix . Weight on 4 02/24/2026 then 185 pounds since then holding it 187 stable. Hold lasix for now.  Hyponatremia in the setting of hyperglycemia, sodium improving.  Hypocalcemia: Resolved.   Noncompliance-counselled  Morbid obesity with BMI 34: Will definitely benefit with weight loss and healthy lifestyle but notes at home is feasible in the current setting.  DVT  prophylaxis:SCD Code Status:DNR Family Communication: plan of care discussed with patient at bedside.  Does not want [name] to call the family. Status is: Inpatient  Remains inpatient appropriate because:Persistent severe electrolyte disturbances and Inpatient level of care appropriate due to severity of illness   Dispo: The patient is from: Home              Anticipated d/c is to: CIR vs SNF.  CIR consulted              Anticipated d/c date is: when bed available              Patient currently is medically stable to d/c.  Will transfer to [place] unit.   Nutrition: Diet Order            DIET DYS 3 Room service appropriate? Yes with Assist; Fluid consistency: Thin  Diet effective now              Nutrition Problem: Inadequate oral intake Etiology: poor appetite Signs/Symptoms: meal completion < 25% Interventions: Boost Breeze, MVI Body mass index is 34.56 kg/m.  Consultants: GI Procedures:egd Microbiology:see note  Medications: Scheduled Meds: . DULoxetine  30 mg Oral Daily  . feeding supplement (ENSURE ENLIVE)  237 mL Oral TID BM  . gabapentin  100 mg Oral TID  . insulin aspart  0-15 Units Subcutaneous TID WC  . insulin aspart  0-5 Units Subcutaneous QHS  . insulin glargine  15 Units Subcutaneous QHS  . magnesium oxide  400 mg Oral BID  . megestrol  200 mg Oral BID  . multivitamin with minerals  1 tablet Oral Daily  . nystatin  5 mL Oral QID  . pantoprazole (PROTONIX) IV  40 mg Intravenous Q12H  . potassium chloride  20 mEq Oral Daily  . sodium chloride flush  10-40 mL Intracatheter Q12H  . sodium chloride  2 g Oral BID WC  . sucralfate  1 g Oral Q6H   Continuous Infusions: . sodium chloride 250 mL/hr at 03/08/20 0957    Antimicrobials: Anti-infectives (From admission, onward)   Start     Dose/Rate Route Frequency Ordered Stop   02/28/20 0030  cefTRIAXone (ROCEPHIN) 1 g in sodium chloride 0.9 % 100 mL IVPB  Status:  Discontinued     1 g 200 mL/hr over 30 Minutes Intravenous Every 24 hours 02/28/20 0016 03/03/20 1304       Objective: Vitals: Today's Vitals    03/13/20 2300 03/14/20 0400 03/14/20 0433 03/14/20 0805  BP:   115/60 (!) 112/59  Pulse:    86  Resp: 16 14 15 16   Temp:   98.4 F (36.9 C) 98.6 F (37 C)  TempSrc:   Oral Oral  SpO2:      Weight:  85.7 kg    Height:      PainSc: 0-No pain  0-No pain     Intake/Output Summary (Last 24 hours) at 03/14/2020 0933 Last data filed at 03/13/2020 2300 Gross per 24 hour  Intake 240 ml  Output --  Net 240 ml   Filed Weights   03/12/20 0350 03/13/20 0513 03/14/20 0400  Weight: 85 kg 85 kg 85.7 kg   Weight change: 0.7 kg   Intake/Output from previous day: 04/19 0701 - 04/20 0700 In: 240 [P.O.:240] Out: -  Intake/Output this shift: No intake/output data recorded.  Examination:  General exam: Alert awake oriented not in acute distress HEENT:Oral mucosa moist,Ear/Nose WNL grossly,dentition normal. Respiratory system: bilaterally clear, on RA ,no  wheezing or crackles,no use of accessory muscle, non tender. Cardiovascular system: S1 & S2 +, regular, No JVD. Gastrointestinal system: Abdomen soft, NT,ND, BS+. Nervous System:Alert, awake, moving extremities and grossly nonfocal Extremities: No edema, distal peripheral pulses palpable.  Skin: No rashes,no icterus. MSK: Normal muscle bulk,tone, power  Data Reviewed: I have personally reviewed following labs and imaging studies CBC: Recent Labs  Lab 03/10/20 0205 03/12/20 0450 03/13/20 0214  WBC 11.8* 11.4* 9.9  HGB 11.7* 10.9* 11.0*  HCT 35.8* 33.7* 33.4*  MCV 71.6* 74.1* 71.8*  PLT 255 248 [id number]   Basic Metabolic Panel: Recent Labs  Lab 03/08/20 0411 03/08/20 1344 03/08/20 2013 03/08/20 2013 03/09/20 0227 03/09/20 0227 03/10/20 0205 03/11/20 1326 03/12/20 0450 03/13/20 0214 03/14/20 0300  NA   < >  --  129*   < > 132*   < > 132* 132* 128* 131* 129*  K   < >  --  3.4*   < > 3.2*   < > 3.3* 3.5 3.2* 3.2* 3.4*  CL   < >  --  94*   < > 94*   < > 95* 96* 91* 93* 92*  CO2   < >  --  23   < > 24   < > 25 25 27 27 27    GLUCOSE   < >  --  198*   < > 71   < > 185* 195* 309* 327* 282*  BUN   < >  --  8   < > 9   < > 8 8 10 11 13   CREATININE   < >  --  0.87   < > 0.85   < > 0.94 0.66 0.73 0.78 0.81  CALCIUM   < >  --  8.2*   < > 8.2*   < > 8.6* 8.7* 8.7* 8.9 8.8*  MG   < > 1.3* 1.7   < > 2.7*  --  1.7 1.4* 1.3* 1.6*  --   PHOS  --  2.1* 2.3*  --   --   --  2.6  --  1.6* 1.7*  --    < > = values in this interval not displayed.   GFR: Estimated Creatinine Clearance: 60.9 mL/min (by C-G formula based on SCr of 0.81 mg/dL). Liver Function Tests: Recent Labs  Lab 03/13/20 0214  AST 24  ALT 16  ALKPHOS 82  BILITOT 0.3  PROT 5.8*  ALBUMIN 2.4*   No results for input(s): LIPASE, AMYLASE in the last 168 hours. No results for input(s): AMMONIA in the last 168 hours. Coagulation Profile: No results for input(s): INR, PROTIME in the last 168 hours. Cardiac Enzymes: No results for input(s): CKTOTAL, CKMB, CKMBINDEX, TROPONINI in the last 168 hours. BNP (last 3 results) No results for input(s): PROBNP in the last 8760 hours. HbA1C: No results for input(s): HGBA1C in the last 72 hours. CBG: Recent Labs  Lab 03/13/20 0630 03/13/20 1125 03/13/20 1637 03/13/20 2145 03/14/20 0620  GLUCAP 380* 115* 267* 168* 201*   Lipid Profile: No results for input(s): CHOL, HDL, LDLCALC, TRIG, CHOLHDL, LDLDIRECT in the last 72 hours. Thyroid Function Tests: No results for input(s): TSH, T4TOTAL, FREET4, T3FREE, THYROIDAB in the last 72 hours. Anemia Panel: No results for input(s): VITAMINB12, FOLATE, FERRITIN, TIBC, IRON, RETICCTPCT in the last 72 hours. Sepsis Labs: No results for input(s): PROCALCITON, LATICACIDVEN in the last 168 hours.  No results found for this or any previous visit (from the  past 240 hour(s)).    Radiology Studies: No results found.   LOS: 15 days   Time spent: More than 50% of that time was spent in counseling and/or coordination of care.  Antonieta Pert, MD [facility]  03/14/2020, 9:33 AM

## 2020-03-14 NOTE — Progress Notes (Signed)
Physical Therapy Treatment Patient Details Name: Yvonne [name] MRN: [id number] DOB: 06-Aug-1946 Today's Date: 03/14/2020    History of Present Illness Pt is a 74 yo female s/p UTI, diabetic foot ulcer, DKA and cellulitis. EGD on 4/11 revealing esophagitis, gastritis, and duodenitis. PMHx: dementia, T2DM, COPD, CHF,     PT Comments    Pt with covers over her head on arrival. Pt not oriented to place or situation stating she is not in the hospital. Pt educated for need to mobilize to improve function however even with orientation and education pt demonstrated no recognition of education or deficits. Pt resistant to movement this session and would not follow commands to EOB and resistant to elevate trunk from surface. Will continue to attempt to mobilize to increase function as pt able to participate and tolerate.     Follow Up Recommendations  SNF;Supervision/Assistance - 24 hour     Equipment Recommendations  None recommended by PT    Recommendations for Other Services       Precautions / Restrictions Precautions Precautions: Fall Precaution Comments: L shoulder poor mobility; incontinent    Mobility  Bed Mobility   Bed Mobility: Supine to Sit;Sit to Supine     Supine to sit: Max assist Sit to supine: Max assist;+2 for physical assistance   General bed mobility comments: pt not attempting to assist to transition to EOB and simply staring at the ceiling, when legs assisted to EOB she threatened to hit therapist but would not lift legs to move them back. Max assist to EOB, max +2 back to bed and total +2 to slide toward HOB. Pt resistantant to lifting trunk from surface and would not sit fully or even lift head for pillow placement  Transfers                 General transfer comment: unable as pt resitant to mobility this session  Ambulation/Gait                 Stairs             Wheelchair Mobility    Modified Rankin (Stroke Patients Only)        Balance                                            Cognition Arousal/Alertness: Awake/alert Behavior During Therapy: Flat affect;Agitated Overall Cognitive Status: No family/caregiver present to determine baseline cognitive functioning                                 General Comments: pt with hx of dementia and currently not oriented stating that she is not in the hospital. Pt agitated, verbally aggressive and refusing significant interaction. Not following commands throughout      Exercises      General Comments        Pertinent Vitals/Pain Pain Assessment: (pt would not respond or state)    Home Living                      Prior Function            PT Goals (current goals can now be found in the care plan section) Progress towards PT goals: Not progressing toward goals - comment    Frequency    Min  2X/week      PT Plan Frequency needs to be updated;Current plan remains appropriate    Co-evaluation              AM-PAC PT "6 Clicks" Mobility   Outcome Measure  Help needed turning from your back to your side while in a flat bed without using bedrails?: Total Help needed moving from lying on your back to sitting on the side of a flat bed without using bedrails?: Total Help needed moving to and from a bed to a chair (including a wheelchair)?: Total Help needed standing up from a chair using your arms (e.g., wheelchair or bedside chair)?: Total Help needed to walk in hospital room?: Total Help needed climbing 3-5 steps with a railing? : Total 6 Click Score: 6    End of Session   Activity Tolerance: Other (comment)(pt limited by cognition and lack of participation) Patient left: in bed;with call bell/phone within reach;with bed alarm set Nurse Communication: Mobility status PT Visit Diagnosis: Unsteadiness on feet (R26.81);Other abnormalities of gait and mobility (R26.89);Muscle weakness (generalized)  (M62.81)     Time: [id number] PT Time Calculation (min) (ACUTE ONLY): 12 min  Charges:  $Therapeutic Activity: 8-22 mins                     Lindsie Simar P, PT Acute Rehabilitation Services Pager: [phone] Office: [place] 03/14/2020, 1:12 PM

## 2020-03-14 NOTE — Progress Notes (Signed)
ABI's have been completed. Preliminary results can be found in CV Proc through chart review.   03/14/20 10:56 AM Yvonne [name] RVT

## 2020-03-15 LAB — GLUCOSE, CAPILLARY
Glucose-Capillary: 138 mg/dL — ABNORMAL HIGH (ref 70–99)
Glucose-Capillary: 102 mg/dL — ABNORMAL HIGH (ref 70–99)
Glucose-Capillary: 184 mg/dL — ABNORMAL HIGH (ref 70–99)
Glucose-Capillary: 228 mg/dL — ABNORMAL HIGH (ref 70–99)

## 2020-03-15 LAB — BASIC METABOLIC PANEL
Anion gap: 9 (ref 5–15)
BUN: 9 mg/dL (ref 8–23)
CO2: 28 mmol/L (ref 22–32)
Calcium: 9.2 mg/dL (ref 8.9–10.3)
Chloride: 98 mmol/L (ref 98–111)
Creatinine, Ser: 0.64 mg/dL (ref 0.44–1.00)
GFR calc Af Amer: >60 mL/min (ref >60)
GFR calc non Af Amer: >60 mL/min (ref >60)
Glucose, Bld: 111 mg/dL — ABNORMAL HIGH (ref 70–99)
Potassium: 3.2 mmol/L — ABNORMAL LOW (ref 3.5–5.1)
Sodium: 135 mmol/L (ref 135–145)

## 2020-03-15 MED ORDER — METOPROLOL TARTRATE 12.5 MG HALF TABLET
12.5000 mg | ORAL_TABLET | Freq: Two times a day (BID) | ORAL | Status: DC
  Administered 2020-03-15 – 2020-03-29 (×27): 12.5 mg via ORAL
  Filled 2020-03-15 (×29): qty 1

## 2020-03-15 MED ORDER — PRO-STAT SUGAR FREE PO LIQD
30.0000 mL | Freq: Two times a day (BID) | ORAL | Status: DC
  Administered 2020-03-15 – 2020-03-28 (×24): 30 mL via ORAL
  Filled 2020-03-15 (×26): qty 30

## 2020-03-15 MED ORDER — COLLAGENASE 250 UNIT/GM EX OINT
TOPICAL_OINTMENT | Freq: Every day | CUTANEOUS | Status: DC
  Filled 2020-03-15: qty 30

## 2020-03-15 NOTE — Progress Notes (Signed)
PROGRESS NOTE    Yvonne [name]  [id number] DOB: 08/02/1946 DOA: 02/27/2020 PCP: Tresa Garter, MD    Brief Narrative:  74 year old female with dementia, DM 2, COPD, CHF, hyperlipidemia presented to hospital on 02/27/20 with altered mental status, high blood sugar, hematemesis.  Patient's daughter-in-law found her at home vomiting some blood.  Patient was seen in the ER and was found to have significant electrolyte imbalance with lactic acidosis, hyponatremia hyperkalemia, DKA, UTI, diabetic foot ulcer/cellulitis.  Patient has not been compliant with her insulin regimen, and family has been out of touch for few months-it appears patient has been noncompliant with medication, and also with complicated and fractured family dynamics.  In ER, CT head was negative for acute finding, chest x-ray unremarkable, MRI of the lower extremities no evidence of cellulitis EKG prolonged QTC. Patient was seen by GI underwent barium swallow that showed delayed passage of contrast at the GE junction and distal esophagus radiology concerned about pseudoachalasia and subsequently underwent  EGD 03/05/2020-found to have extensive esophagitis, gastritis and duodenitis.Biopsies were taken. No mass was seen at the GE junction. No dilatation was performed for the subtle mild stricture at GEJ due to the severe esophagitis. Recommendations are for PPI and sucralfate. The patient has been started on a clear liquid diet. The patient's DIL [name] stated that if the patient does not improve her PO intake, that she would be interested in hospice. DIL [name] has stated that both she and [name] are interested in a feeding tube. The patient no longer wants to talk to any of her family members about it and does not want feeding tube.    Assessment & Plan:   Active Problems:   DKA (diabetic ketoacidoses) ([place])   Dysphagia   Hematemesis with nausea  DKA/AGMA/Metabolic alkalosis:Resolved.Suspect secondary to UTI/diabetic  foot wound/noncompliance.  Type 2 diabetes mellitus poorly controlled, with insulin noncompliance.  Hemoglobin A1c 10.1. Cont on Lantus 15 units bedtime (started 4/19), ssi and adjust insulin as oral intake picks up. Glycemic trends appear stable  Hematemesis/extensive esophagitis, gastritis and duodenitis/nausea vomiting: Nausea vomiting resolved.  Was earlier by GI, underwent EGD, now on PPI.  Anorexia in the setting of uncontrolled diabetes, esophagitis gastritis-started on Megace, encourage oral intake.  Pt reportedly does not desire to have a feeding tube.  Foot wound diabetic versus vascular: on Lt Grt toe. healing well. Cont wound care. blood culture no growth. Monitor, cont wound care. ABI reviewed. Mild vascular disease on R, normal on L  Suspected UTI, with abnormal UA on admission treated with IV antibiotics cultures no growth.  Currently off abx  Hypokalemia/hypomagnesemia/hypophosphatemia- Potassium low. Will replace. Repeat bmet in AM  History of COPD: Remains on room air and stable.  Chronic pain Continued on Neurontin  Anxiety/insomnia on low-dose as needed Xanax  History of chronic diastolic CHF last ejection normal with findings consistent with diastoilc dysfunction, cont on IV Lasix . Weight currently stable at 85kg  Hyponatremia in the setting of hyperglycemia, sodium normal range.  Hypocalcemia: Resolved.   Noncompliance-counselled recently  Morbid obesity with BMI 34: Will definitely benefit with weight loss and healthy lifestyle but notes at home is feasible in the current setting.  SVT Brief and self limiting noted this afternoon Will start low dose metoprolol 12.5mg  bid with hold parameters  DVT prophylaxis: SCD's Code Status: DNR Family Communication: pt in room, family not at bedside  Status is: Inpatient  Remains inpatient appropriate because:Unsafe d/c plan   Dispo: The patient is from: Home  Anticipated d/c is to:  SNF              Anticipated d/c date is: 3 days              Patient currently is not medically stable to d/c.   Consultants:   GI  Palliative Care  Procedures:   EGD  Antimicrobials: Anti-infectives (From admission, onward)   Start     Dose/Rate Route Frequency Ordered Stop   02/28/20 0030  cefTRIAXone (ROCEPHIN) 1 g in sodium chloride 0.9 % 100 mL IVPB  Status:  Discontinued     1 g 200 mL/hr over 30 Minutes Intravenous Every 24 hours 02/28/20 0016 03/03/20 1304       Subjective: Without complaints this AM. Seen at bedside with pt eating  Objective: Vitals:   03/14/20 2056 03/15/20 0437 03/15/20 0500 03/15/20 1012  BP: 121/67 120/69  118/67  Pulse: 83 85  80  Resp: 18 18  18   Temp: 98.3 F (36.8 C) 98.8 F (37.1 C)  98.6 F (37 C)  TempSrc: Oral   Oral  SpO2: 98% 94%  95%  Weight:   85.8 kg   Height:        Intake/Output Summary (Last 24 hours) at 03/15/2020 1656 Last data filed at 03/15/2020 1312 Gross per 24 hour  Intake 36994.17 ml  Output 0 ml  Net 36994.17 ml   Filed Weights   03/13/20 0513 03/14/20 0400 03/15/20 0500  Weight: 85 kg 85.7 kg 85.8 kg    Examination:  General exam: Appears calm and comfortable  Respiratory system: Clear to auscultation. Respiratory effort normal. Cardiovascular system: S1 & S2 heard, Regular Gastrointestinal system: Abdomen is nondistended, pos bs Central nervous system: Alert and oriented. No focal neurological deficits. Extremities: Symmetric 5 x 5 power. Skin: No rashes, lesions Psychiatry: Judgement and insight appear normal. Mood & affect appropriate.   Data Reviewed: I have personally reviewed following labs and imaging studies  CBC: Recent Labs  Lab 03/10/20 0205 03/12/20 0450 03/13/20 0214  WBC 11.8* 11.4* 9.9  HGB 11.7* 10.9* 11.0*  HCT 35.8* 33.7* 33.4*  MCV 71.6* 74.1* 71.8*  PLT 255 248 [id number]   Basic Metabolic Panel: Recent Labs  Lab 03/08/20 2013 03/08/20 2013 03/09/20 0227  03/09/20 0227 03/10/20 0205 03/10/20 0205 03/11/20 1326 03/12/20 0450 03/13/20 0214 03/14/20 0300 03/15/20 1138  NA 129*   < > 132*   < > 132*   < > 132* 128* 131* 129* 135  K 3.4*   < > 3.2*   < > 3.3*   < > 3.5 3.2* 3.2* 3.4* 3.2*  CL 94*   < > 94*   < > 95*   < > 96* 91* 93* 92* 98  CO2 23   < > 24   < > 25   < > 25 27 27 27 28   GLUCOSE 198*   < > 71   < > 185*   < > 195* 309* 327* 282* 111*  BUN 8   < > 9   < > 8   < > 8 10 11 13 9   CREATININE 0.87   < > 0.85   < > 0.94   < > 0.66 0.73 0.78 0.81 0.64  CALCIUM 8.2*   < > 8.2*   < > 8.6*   < > 8.7* 8.7* 8.9 8.8* 9.2  MG 1.7   < > 2.7*  --  1.7  --  1.4* 1.3* 1.6*  --   --  PHOS 2.3*  --   --   --  2.6  --   --  1.6* 1.7*  --   --    < > = values in this interval not displayed.   GFR: Estimated Creatinine Clearance: 61.8 mL/min (by C-G formula based on SCr of 0.64 mg/dL). Liver Function Tests: Recent Labs  Lab 03/13/20 0214  AST 24  ALT 16  ALKPHOS 82  BILITOT 0.3  PROT 5.8*  ALBUMIN 2.4*   No results for input(s): LIPASE, AMYLASE in the last 168 hours. No results for input(s): AMMONIA in the last 168 hours. Coagulation Profile: No results for input(s): INR, PROTIME in the last 168 hours. Cardiac Enzymes: No results for input(s): CKTOTAL, CKMB, CKMBINDEX, TROPONINI in the last 168 hours. BNP (last 3 results) No results for input(s): PROBNP in the last 8760 hours. HbA1C: No results for input(s): HGBA1C in the last 72 hours. CBG: Recent Labs  Lab 03/14/20 1603 03/14/20 2027 03/15/20 0731 03/15/20 1205 03/15/20 1608  GLUCAP 188* 242* 138* 102* 184*   Lipid Profile: No results for input(s): CHOL, HDL, LDLCALC, TRIG, CHOLHDL, LDLDIRECT in the last 72 hours. Thyroid Function Tests: No results for input(s): TSH, T4TOTAL, FREET4, T3FREE, THYROIDAB in the last 72 hours. Anemia Panel: No results for input(s): VITAMINB12, FOLATE, FERRITIN, TIBC, IRON, RETICCTPCT in the last 72 hours. Sepsis Labs: No results for  input(s): PROCALCITON, LATICACIDVEN in the last 168 hours.  No results found for this or any previous visit (from the past 240 hour(s)).   Radiology Studies: VAS [name] ABI WITH/WO TBI  Result Date: 03/14/2020 LOWER EXTREMITY DOPPLER STUDY Indications: Ulceration. High Risk Factors: Hypertension.  Limitations: Today's exam was limited due to involuntary patient movement. Comparison Study: No prior studies. Performing Technologist: Carlos Levering RVT  Examination Guidelines: A complete evaluation includes at minimum, Doppler waveform signals and systolic blood pressure reading at the level of bilateral brachial, anterior tibial, and posterior tibial arteries, when vessel segments are accessible. Bilateral testing is considered an integral part of a complete examination. Photoelectric Plethysmograph (PPG) waveforms and toe systolic pressure readings are included as required and additional duplex testing as needed. Limited examinations for reoccurring indications may be performed as noted.  ABI Findings: +---------+------------------+-----+----------+--------+ Right    Rt Pressure (mmHg)IndexWaveform  Comment  +---------+------------------+-----+----------+--------+ Brachial 93                     biphasic           +---------+------------------+-----+----------+--------+ PTA      78                0.84 monophasic         +---------+------------------+-----+----------+--------+ DP       61                0.66 monophasic         +---------+------------------+-----+----------+--------+ Great Toe34                0.37                    +---------+------------------+-----+----------+--------+ +---------+------------------+-----+----------+--------------+ Left     Lt Pressure (mmHg)IndexWaveform  Comment        +---------+------------------+-----+----------+--------------+ Brachial                                  Restricted arm  +---------+------------------+-----+----------+--------------+ PTA      65  0.70 monophasic               +---------+------------------+-----+----------+--------------+ DP       97                1.04 biphasic                 +---------+------------------+-----+----------+--------------+ Great Toe56                0.60                          +---------+------------------+-----+----------+--------------+ +-------+-----------+-----------+------------+------------+ ABI/TBIToday's ABIToday's TBIPrevious ABIPrevious TBI +-------+-----------+-----------+------------+------------+ Right  0.84       0.37                                +-------+-----------+-----------+------------+------------+ Left   1.04       0.6                                 +-------+-----------+-----------+------------+------------+  Summary: Right: Resting right ankle-brachial index indicates mild right lower extremity arterial disease. The right toe-brachial index is abnormal. Left: Resting left ankle-brachial index is within normal range. No evidence of significant left lower extremity arterial disease. The left toe-brachial index is abnormal.  *See table(s) above for measurements and observations.  Electronically signed by Harold Barban MD on 03/14/2020 at 5:01:11 PM.    Final     Scheduled Meds: . collagenase   Topical Daily  . DULoxetine  30 mg Oral Daily  . feeding supplement (ENSURE ENLIVE)  237 mL Oral TID BM  . feeding supplement (PRO-STAT SUGAR FREE 64)  30 mL Oral BID  . gabapentin  100 mg Oral TID  . insulin aspart  0-15 Units Subcutaneous TID WC  . insulin aspart  0-5 Units Subcutaneous QHS  . insulin glargine  15 Units Subcutaneous QHS  . magnesium oxide  400 mg Oral BID  . megestrol  200 mg Oral BID  . multivitamin with minerals  1 tablet Oral Daily  . nystatin  5 mL Oral QID  . pantoprazole (PROTONIX) IV  40 mg Intravenous Q12H  . potassium chloride  20 mEq Oral Daily   . sodium chloride flush  10-40 mL Intracatheter Q12H  . sodium chloride  2 g Oral BID WC  . sucralfate  1 g Oral Q6H   Continuous Infusions: . sodium chloride 250 mL/hr at 03/08/20 0957     LOS: 16 days   Marylu Lund, MD [facility] Pager On Amion  If 7PM-7AM, please contact night-coverage 03/15/2020, 4:56 PM

## 2020-03-15 NOTE — Progress Notes (Signed)
Nutrition Follow up  DOCUMENTATION CODES:   Not applicable  INTERVENTION:    Ensure Enlive po TID, each supplement provides 350 kcal and 20 grams of protein  30 ml Prostat BID, each supplement provides 100 kcals and 15 grams protein.   MVI daily   NUTRITION DIAGNOSIS:   Inadequate oral intake related to poor appetite as evidenced by meal completion < 25%   Progressing   GOAL:   Patient will meet greater than or equal to 90% of their needs   Progressing  MONITOR:   PO intake, Supplement acceptance, Weight trends, Diet advancement, Labs, I & O's, Skin  REASON FOR ASSESSMENT:   Consult Assessment of nutrition requirement/status  ASSESSMENT:   Patient with PMH significant for dementia, DM, COPD, CHF, HLD, and chronic pain. Presents this admission with DKA.   4/11- s/p EGD- revealed esophagitis, gastritis, and duodenitis   Diet advanced to DYS 3 with thin liquids on 4/15. Intake shows to be progressing slightly. Meal completions charted as 25-100% for her last five meals (30%). Pt shows to be drinking 1-2 Ensures daily. Pt does not wish to have feeding tube placed. Continue supplements.   CBG elevated. Insulin just adjusted.   Awaiting d/c to CIR.   Admission weight: 84 kg  Current weight: 85.8 kg   Medications: SS novolog, lantus, mag ox, MVI with minerals, megace, 20 mEq KCl daily Labs: K 3.2 (L) CBG 188-242  Diet Order:   Diet Order            DIET DYS 3 Room service appropriate? Yes with Assist; Fluid consistency: Thin  Diet effective now              EDUCATION NEEDS:   Not appropriate for education at this time  Skin:  Skin Assessment: Skin Integrity Issues: Skin Integrity Issues:: Other (Comment) Other: wound- L foot  Last BM:  4/20  Height:   Ht Readings from Last 1 Encounters:  02/28/20 5\' 2"  (1.575 m)    Weight:   Wt Readings from Last 1 Encounters:  03/15/20 85.8 kg    BMI:  Body mass index is 34.6 kg/m.  Estimated  Nutritional Needs:   Kcal:  1700-1900 kcal  Protein:  85-100 grams  Fluid:  >/= 1.7 L/day   Mariana Single RD, LDN Clinical Nutrition Pager listed in [place]

## 2020-03-15 NOTE — Plan of Care (Signed)
  Problem: Clinical Measurements: Goal: Ability to maintain clinical measurements within normal limits will improve Outcome: Progressing   Problem: Nutrition: Goal: Adequate nutrition will be maintained Outcome: Progressing   

## 2020-03-16 LAB — COMPREHENSIVE METABOLIC PANEL
ALT: 16 U/L (ref 0–44)
AST: 20 U/L (ref 15–41)
Albumin: 2.1 g/dL — ABNORMAL LOW (ref 3.5–5.0)
Alkaline Phosphatase: 70 U/L (ref 38–126)
Anion gap: 6 (ref 5–15)
BUN: 16 mg/dL (ref 8–23)
CO2: 28 mmol/L (ref 22–32)
Calcium: 9.1 mg/dL (ref 8.9–10.3)
Chloride: 95 mmol/L — ABNORMAL LOW (ref 98–111)
Creatinine, Ser: 0.81 mg/dL (ref 0.44–1.00)
GFR calc Af Amer: >60 mL/min (ref >60)
GFR calc non Af Amer: >60 mL/min (ref >60)
Glucose, Bld: 167 mg/dL — ABNORMAL HIGH (ref 70–99)
Potassium: 4.4 mmol/L (ref 3.5–5.1)
Sodium: 129 mmol/L — ABNORMAL LOW (ref 135–145)
Total Bilirubin: 0.2 mg/dL — ABNORMAL LOW (ref 0.3–1.2)
Total Protein: 5.2 g/dL — ABNORMAL LOW (ref 6.5–8.1)

## 2020-03-16 LAB — GLUCOSE, CAPILLARY
Glucose-Capillary: 150 mg/dL — ABNORMAL HIGH (ref 70–99)
Glucose-Capillary: 156 mg/dL — ABNORMAL HIGH (ref 70–99)
Glucose-Capillary: 187 mg/dL — ABNORMAL HIGH (ref 70–99)
Glucose-Capillary: 222 mg/dL — ABNORMAL HIGH (ref 70–99)

## 2020-03-16 NOTE — Patient Care Conference (Signed)
Called and updated pt's son Manford. All questions answered. Per Manford, primary point-person to update would be [name] Yorke at phone number listed in chart.

## 2020-03-16 NOTE — Progress Notes (Signed)
Occupational Therapy Treatment Patient Details Name: Yvonne [name] MRN: [id number] DOB: Aug 25, 1946 Today's Date: 03/16/2020    History of present illness Pt is a 74 yo female s/p UTI, diabetic foot ulcer, DKA and cellulitis. EGD on 4/11 revealing esophagitis, gastritis, and duodenitis. PMHx: dementia, T2DM, COPD, CHF,    OT comments  Pt very resistant to any activity today.  Got to EOB for appx 30 seconds before pt pushing self back down into bed wanting to lay down asking therapist to "leave her be before she gets really mad."  Pt returned to supine and bed alarm placed in on position.  Pt has been resistant to most therapy the past few attempts. Will attempt one more time and then discuss options for d/c from OT.    Follow Up Recommendations  SNF;Supervision/Assistance - 24 hour    Equipment Recommendations       Recommendations for Other Services      Precautions / Restrictions Precautions Precautions: Fall Precaution Comments: Pt resistant to most all activity Restrictions Weight Bearing Restrictions: No       Mobility Bed Mobility Overal bed mobility: Needs Assistance Bed Mobility: Supine to Sit;Sit to Supine Rolling: Mod assist   Supine to sit: Max assist Sit to supine: Max assist;+2 for physical assistance   General bed mobility comments: Pt resistant to movement pushing self down when attemtpting to get up to side of bed.  Finally, pt pushed up to sit but them declined further activity.  Transfers                 General transfer comment: Pt resistant to all mobility or activity this session.    Balance Overall balance assessment: Needs assistance Sitting-balance support: Feet supported;Bilateral upper extremity supported Sitting balance-Leahy Scale: Poor Sitting balance - Comments: feel it is better than pt may have presented bc pt attempting to lay back down.       Standing balance comment: pt refused standing.                            ADL either performed or assessed with clinical judgement   ADL Overall ADL's : Needs assistance/impaired Eating/Feeding: Set up;Sitting                                   Functional mobility during ADLs: Moderate assistance;+2 for physical assistance;+2 for safety/equipment;Cueing for safety General ADL Comments: Pt tolerated sitting EOB for appx 30 seconds before refusing to do more and layed down.  Pt refused further activity.     Vision       Perception     Praxis      Cognition Arousal/Alertness: Awake/alert Behavior During Therapy: Flat affect;Agitated Overall Cognitive Status: No family/caregiver present to determine baseline cognitive functioning                                 General Comments: Pt with h/o dementia. Pt not oriented and not cooperative with much activity.        Exercises     Shoulder Instructions       General Comments Pt not participating well in therapy today.  Will continue attempts but if participation remains an issue, may look at d/c.    Pertinent Vitals/ Pain       Pain Assessment: Faces Faces  Pain Scale: No hurt  Home Living                                          Prior Functioning/Environment              Frequency  Min 2X/week        Progress Toward Goals  OT Goals(current goals can now be found in the care plan section)  Progress towards OT goals: Not progressing toward goals - comment(pt resistant to activity today.  Not cooperative.)  Acute Rehab OT Goals Patient Stated Goal: to go home OT Goal Formulation: With patient Time For Goal Achievement: 03/22/20 Potential to Achieve Goals: Good ADL Goals Pt Will Perform Grooming: with min guard assist;standing Pt Will Perform Lower Body Dressing: with min assist;sitting/lateral leans;sit to/from stand Pt Will Transfer to Toilet: with mod assist;stand pivot transfer;bedside commode Additional ADL Goal #1: Pt will  demonstrate selective attention duirng ADL task in minimally distracting environment with minimal cues. Additional ADL Goal #2: Pt will participate in 10 mins of ADL tasks with minA overall with 2 rest breaks.  Plan Discharge plan remains appropriate    Co-evaluation                 AM-PAC OT "6 Clicks" Daily Activity     Outcome Measure   Help from another person eating meals?: A Little Help from another person taking care of personal grooming?: A Little Help from another person toileting, which includes using toliet, bedpan, or urinal?: A Lot Help from another person bathing (including washing, rinsing, drying)?: A Lot Help from another person to put on and taking off regular upper body clothing?: A Lot Help from another person to put on and taking off regular lower body clothing?: A Lot 6 Click Score: 14    End of Session    OT Visit Diagnosis: Unsteadiness on feet (R26.81);Pain;Other symptoms and signs involving cognitive function   Activity Tolerance Treatment limited secondary to agitation   Patient Left in bed;with call bell/phone within reach;with bed alarm set   Nurse Communication Mobility status        Time: [id number] OT Time Calculation (min): 11 min  Charges: OT General Charges $OT Visit: 1 Visit OT Treatments $Therapeutic Activity: 8-22 mins   Glenford Peers 03/16/2020, 12:08 PM

## 2020-03-16 NOTE — Progress Notes (Signed)
  Speech Language Pathology Treatment: Dysphagia  Patient Details Name: Yvonne [name] MRN: [id number] DOB: May 07, 1946 Today's Date: 03/16/2020 Time: 7505-1833 SLP Time Calculation (min) (ACUTE ONLY): 10 min  Assessment / Plan / Recommendation Clinical Impression  Pt eating dysphagia 3 lunch upon entering room.  Consumed bread with adequate mastication despite absence of dentures, no residue post swallow. Thin liquids are consumed with no concerns for aspiration.  Lungs have been clear per chart review.  Pt feeding herself.  Offered to upgrade to regular diet in case that would open up food options for Yvonne [name], but she declined. Difficult to engage in conversation and did not want HOB elevated further to optimize safety. No further oropharyngeal dysphagia is apparent per bedside re-assessment today.    SLP will sign off.    If pt chooses to try regular solids (it would help to have dentures), she may do so and does not need another SLP swallow eval to advance diet.    HPI HPI: Pt is a 74 y.o. female with a history of dementia, diabetes, COPD, CHF, who presented to the hospital on the fourth with altered mental status, high blood sugars, and complaints of hematemesis.  She was found to have a lactic acidosis and electrolyte abnormalities putting hyponatremia and hypokalemia. Esophagram 4/9: Absent normal primary peristaltic wave. Delayed passage of contrast at the gastroesophageal junction and distal esophagus with an area of subtle luminal irregularity. Findings suspicious for "pseudoachalasia" CT head and CXR negative for acute changes. EGD with Dr. Havery Moros on 03/05/2020. She was found to have extensive esophagitis, gastritis and duodenitis. Biopsies were taken. No mass was seen at the GE junction. Pt is currently on clear liquids. GI's note prior to them signing off on 4/11 states that her previous diet can be resumed.      SLP Plan  All goals met       Recommendations  Diet  recommendations: Dysphagia 3 (mechanical soft);Thin liquid Liquids provided via: Cup;Straw Medication Administration: Whole meds with puree Supervision: Patient able to self feed Postural Changes and/or Swallow Maneuvers: Seated upright 90 degrees                Oral Care Recommendations: Oral care BID Follow up Recommendations: None SLP Visit Diagnosis: Dysphagia, unspecified (R13.10) Plan: All goals met       GO              Kaysee Hergert L. Tivis Ringer, [place] CCC/SLP Acute Rehabilitation Services Office number [phone] Pager [phone]   Assunta Curtis 03/16/2020, 3:59 PM

## 2020-03-16 NOTE — Progress Notes (Signed)
PROGRESS NOTE    Yvonne [name]  [id number] DOB: [birth year] DOA: 02/27/2020 PCP: Tresa Garter, MD    Brief Narrative:  74 year old female with dementia, DM 2, COPD, CHF, hyperlipidemia presented to hospital on 02/27/20 with altered mental status, high blood sugar, hematemesis.  Patient's daughter-in-law found her at home vomiting some blood.  Patient was seen in the ER and was found to have significant electrolyte imbalance with lactic acidosis, hyponatremia hyperkalemia, DKA, UTI, diabetic foot ulcer/cellulitis.  Patient has not been compliant with her insulin regimen, and family has been out of touch for few months-it appears patient has been noncompliant with medication, and also with complicated and fractured family dynamics.  In ER, CT head was negative for acute finding, chest x-ray unremarkable, MRI of the lower extremities no evidence of cellulitis EKG prolonged QTC. Patient was seen by GI underwent barium swallow that showed delayed passage of contrast at the GE junction and distal esophagus radiology concerned about pseudoachalasia and subsequently underwent  EGD 03/05/2020-found to have extensive esophagitis, gastritis and duodenitis.Biopsies were taken. No mass was seen at the GE junction. No dilatation was performed for the subtle mild stricture at GEJ due to the severe esophagitis. Recommendations are for PPI and sucralfate. The patient has been started on a clear liquid diet. The patient's DIL [name] stated that if the patient does not improve her PO intake, that she would be interested in hospice. DIL [name] has stated that both she and [name] are interested in a feeding tube. The patient no longer wants to talk to any of her family members about it and does not want feeding tube.    Assessment & Plan:   Active Problems:   DKA (diabetic ketoacidoses) ([place])   Dysphagia   Hematemesis with nausea  DKA/AGMA/Metabolic alkalosis:Resolved.Suspect secondary to UTI/diabetic  foot wound/noncompliance.  Type 2 diabetes mellitus poorly controlled, with insulin noncompliance.  Hemoglobin A1c 10.1. Cont on Lantus 15 units bedtime (started 4/19), ssi and adjust insulin as oral intake picks up. Glycemic trends appear stable at this time  Hematemesis/extensive esophagitis, gastritis and duodenitis/nausea vomiting: Nausea vomiting resolved.  Was earlier by GI, underwent EGD, now on PPI.  Anorexia in the setting of uncontrolled diabetes, esophagitis gastritis-started on Megace, encourage oral intake.  Pt reportedly does not desire to have a feeding tube. Dietitian following, recommendation for nutritional supplements Per dietitian, intake shows to be progressing slightly  Foot wound diabetic versus vascular: on Lt Grt toe. healing well. Cont wound care. blood culture no growth. Monitor, cont wound care. ABI reviewed. Mild vascular disease on R, normal on L Stable  Suspected UTI, with abnormal UA on admission treated with IV antibiotics cultures no growth.  Currently off abx  Hypokalemia/hypomagnesemia/hypophosphatemia- Potassium low. Will replace. Recheck bmet in AM  History of COPD: Remains on room air and stable.  Chronic pain Pt is continued on Neurontin  Anxiety/insomnia on low-dose as needed Xanax  History of chronic diastolic CHF last ejection normal with findings consistent with diastoilc dysfunction, cont on IV Lasix . Weight currently stable at 85.8kg  Hyponatremia in the setting of hyperglycemia, sodium normal range.  Hypocalcemia: Noted to be resolved.   Noncompliance-counselled recently  Morbid obesity with BMI 34: Will definitely benefit with weight loss and healthy lifestyle but notes at home is feasible in the current setting.  SVT Brief and self limiting noted this afternoon Will start low dose metoprolol 12.5mg  bid with hold parameters Stable at this time  DVT prophylaxis: SCD's Code Status: DNR Family  Communication: pt in  room, family not at bedside  Status is: Inpatient  Remains inpatient appropriate because:Unsafe d/c plan   Dispo: The patient is from: Home              Anticipated d/c is to: SNF              Anticipated d/c date is: 3 days              Patient currently is not medically stable to d/c.   Consultants:   GI  Palliative Care  Procedures:   EGD  Antimicrobials: Anti-infectives (From admission, onward)   Start     Dose/Rate Route Frequency Ordered Stop   02/28/20 0030  cefTRIAXone (ROCEPHIN) 1 g in sodium chloride 0.9 % 100 mL IVPB  Status:  Discontinued     1 g 200 mL/hr over 30 Minutes Intravenous Every 24 hours 02/28/20 0016 03/03/20 1304      Subjective: Without complaints this AM  Objective: Vitals:   03/15/20 2100 03/16/20 0418 03/16/20 0932 03/16/20 1656  BP: (!) 115/52 126/68 92/71 (!) 118/59  Pulse: 80 79 81 82  Resp: 18 20 20 18   Temp: 98.8 F (37.1 C) 99.1 F (37.3 C) 99.2 F (37.3 C) 98.1 F (36.7 C)  TempSrc: Oral Oral Oral Oral  SpO2: 98% 99% 98% 97%  Weight:      Height:        Intake/Output Summary (Last 24 hours) at 03/16/2020 1810 Last data filed at 03/16/2020 1600 Gross per 24 hour  Intake 1192 ml  Output 400 ml  Net 792 ml   Filed Weights   03/13/20 0513 03/14/20 0400 03/15/20 0500  Weight: 85 kg 85.7 kg 85.8 kg    Examination: General exam: Awake, laying in bed, in nad Respiratory system: Normal respiratory effort, no wheezing Cardiovascular system: regular rate, s1, s2 Gastrointestinal system: Soft, nondistended, positive BS Central nervous system: CN2-12 grossly intact, strength intact Extremities: Perfused, no clubbing Skin: Normal skin turgor, no notable skin lesions seen Psychiatry: Mood normal // no visual hallucinations   Data Reviewed: I have personally reviewed following labs and imaging studies  CBC: Recent Labs  Lab 03/10/20 0205 03/12/20 0450 03/13/20 0214  WBC 11.8* 11.4* 9.9  HGB 11.7* 10.9* 11.0*  HCT  35.8* 33.7* 33.4*  MCV 71.6* 74.1* 71.8*  PLT 255 248 [id number]   Basic Metabolic Panel: Recent Labs  Lab 03/10/20 0205 03/10/20 0205 03/11/20 1326 03/11/20 1326 03/12/20 0450 03/13/20 0214 03/14/20 0300 03/15/20 1138 03/16/20 0348  NA 132*   < > 132*   < > 128* 131* 129* 135 129*  K 3.3*   < > 3.5   < > 3.2* 3.2* 3.4* 3.2* 4.4  CL 95*   < > 96*   < > 91* 93* 92* 98 95*  CO2 25   < > 25   < > 27 27 27 28 28   GLUCOSE 185*   < > 195*   < > 309* 327* 282* 111* 167*  BUN 8   < > 8   < > 10 11 13 9 16   CREATININE 0.94   < > 0.66   < > 0.73 0.78 0.81 0.64 0.81  CALCIUM 8.6*   < > 8.7*   < > 8.7* 8.9 8.8* 9.2 9.1  MG 1.7  --  1.4*  --  1.3* 1.6*  --   --   --   PHOS 2.6  --   --   --  1.6* 1.7*  --   --   --    < > = values in this interval not displayed.   GFR: Estimated Creatinine Clearance: 61 mL/min (by C-G formula based on SCr of 0.81 mg/dL). Liver Function Tests: Recent Labs  Lab 03/13/20 0214 03/16/20 0348  AST 24 20  ALT 16 16  ALKPHOS 82 70  BILITOT 0.3 0.2*  PROT 5.8* 5.2*  ALBUMIN 2.4* 2.1*   No results for input(s): LIPASE, AMYLASE in the last 168 hours. No results for input(s): AMMONIA in the last 168 hours. Coagulation Profile: No results for input(s): INR, PROTIME in the last 168 hours. Cardiac Enzymes: No results for input(s): CKTOTAL, CKMB, CKMBINDEX, TROPONINI in the last 168 hours. BNP (last 3 results) No results for input(s): PROBNP in the last 8760 hours. HbA1C: No results for input(s): HGBA1C in the last 72 hours. CBG: Recent Labs  Lab 03/15/20 2121 03/16/20 0639 03/16/20 0738 03/16/20 1135 03/16/20 1658  GLUCAP 228* 150* 156* 187* 222*   Lipid Profile: No results for input(s): CHOL, HDL, LDLCALC, TRIG, CHOLHDL, LDLDIRECT in the last 72 hours. Thyroid Function Tests: No results for input(s): TSH, T4TOTAL, FREET4, T3FREE, THYROIDAB in the last 72 hours. Anemia Panel: No results for input(s): VITAMINB12, FOLATE, FERRITIN, TIBC, IRON,  RETICCTPCT in the last 72 hours. Sepsis Labs: No results for input(s): PROCALCITON, LATICACIDVEN in the last 168 hours.  No results found for this or any previous visit (from the past 240 hour(s)).   Radiology Studies: No results found.  Scheduled Meds: . collagenase   Topical Daily  . DULoxetine  30 mg Oral Daily  . feeding supplement (ENSURE ENLIVE)  237 mL Oral TID BM  . feeding supplement (PRO-STAT SUGAR FREE 64)  30 mL Oral BID  . gabapentin  100 mg Oral TID  . insulin aspart  0-15 Units Subcutaneous TID WC  . insulin aspart  0-5 Units Subcutaneous QHS  . insulin glargine  15 Units Subcutaneous QHS  . megestrol  200 mg Oral BID  . metoprolol tartrate  12.5 mg Oral BID  . multivitamin with minerals  1 tablet Oral Daily  . nystatin  5 mL Oral QID  . pantoprazole (PROTONIX) IV  40 mg Intravenous Q12H  . potassium chloride  20 mEq Oral Daily  . sodium chloride flush  10-40 mL Intracatheter Q12H  . sodium chloride  2 g Oral BID WC  . sucralfate  1 g Oral Q6H   Continuous Infusions: . sodium chloride 250 mL/hr at 03/08/20 0957     LOS: 17 days   Marylu Lund, MD [facility] Pager On Amion  If 7PM-7AM, please contact night-coverage 03/16/2020, 6:10 PM

## 2020-03-17 DIAGNOSIS — R1319 Other dysphagia: Secondary | ICD-10-CM

## 2020-03-17 LAB — COMPREHENSIVE METABOLIC PANEL
ALT: 17 U/L (ref 0–44)
AST: 29 U/L (ref 15–41)
Albumin: 2.1 g/dL — ABNORMAL LOW (ref 3.5–5.0)
Alkaline Phosphatase: 69 U/L (ref 38–126)
Anion gap: 10 (ref 5–15)
BUN: 16 mg/dL (ref 8–23)
CO2: 20 mmol/L — ABNORMAL LOW (ref 22–32)
Calcium: 9.4 mg/dL (ref 8.9–10.3)
Chloride: 100 mmol/L (ref 98–111)
Creatinine, Ser: 0.7 mg/dL (ref 0.44–1.00)
GFR calc Af Amer: >60 mL/min (ref >60)
GFR calc non Af Amer: >60 mL/min (ref >60)
Glucose, Bld: 170 mg/dL — ABNORMAL HIGH (ref 70–99)
Potassium: 5.2 mmol/L — ABNORMAL HIGH (ref 3.5–5.1)
Sodium: 130 mmol/L — ABNORMAL LOW (ref 135–145)
Total Bilirubin: 0.9 mg/dL (ref 0.3–1.2)
Total Protein: 5.1 g/dL — ABNORMAL LOW (ref 6.5–8.1)

## 2020-03-17 LAB — GLUCOSE, CAPILLARY
Glucose-Capillary: 151 mg/dL — ABNORMAL HIGH (ref 70–99)
Glucose-Capillary: 252 mg/dL — ABNORMAL HIGH (ref 70–99)
Glucose-Capillary: 183 mg/dL — ABNORMAL HIGH (ref 70–99)
Glucose-Capillary: 275 mg/dL — ABNORMAL HIGH (ref 70–99)

## 2020-03-17 LAB — MAGNESIUM: Magnesium: 1.5 mg/dL — ABNORMAL LOW (ref 1.7–2.4)

## 2020-03-17 MED ORDER — FUROSEMIDE 10 MG/ML IJ SOLN
40.0000 mg | Freq: Once | INTRAMUSCULAR | Status: AC
  Administered 2020-03-17: 40 mg via INTRAVENOUS
  Filled 2020-03-17: qty 4

## 2020-03-17 MED ORDER — MAGNESIUM SULFATE 4 GM/100ML IV SOLN
4.0000 g | Freq: Once | INTRAVENOUS | Status: AC
  Administered 2020-03-17: 4 g via INTRAVENOUS
  Filled 2020-03-17 (×2): qty 100

## 2020-03-17 NOTE — TOC Progression Note (Signed)
Transition of Care [facility]) - Progression Note    Patient Details  Name: ALAZAE SAWHNEY MRN: [id number] Date of Birth: [birth year]  Transition of [facility]) CM/SW Contact  Sharlet Salina Mila Homer, LCSW Phone Number: 03/17/2020, 5:44 PM  Clinical Narrative:  Reviewed bed offers and contacted [facility], and per admissions staff person Caryl Pina, no beds right now or over the weekend. CSW can contact them on Monday. CSW attempted to reach daughter-in-law [name] Steenson (4:55 pm) to give bed offers and information on contact with [employer] and no voice mail.   Contacted [facility] and they manage patient.       Expected Discharge Plan: [place] Barriers to Discharge: Continued Medical Work up  Expected Discharge Plan and Services Expected Discharge Plan: [place] In-house Referral: Clinical Social Work(TOC referral)     Living arrangements for the past 2 months: Apartment                                       Social Determinants of Health (SDOH) Interventions    Readmission Risk Interventions No flowsheet data found.

## 2020-03-17 NOTE — Plan of Care (Signed)
  Problem: Safety: Goal: Ability to remain free from injury will improve Outcome: Progressing   

## 2020-03-17 NOTE — Clinical Social Work Note (Addendum)
     RE:     Date of Birth:   ___________  Date:        To Whom It May Concern:  Please be advised that the above-named patient will require a short-term nursing home stay - anticipated 30 days or less for rehabilitation and strengthening.  The plan is for return home.

## 2020-03-17 NOTE — Progress Notes (Addendum)
PROGRESS NOTE    Yvonne [name]  [id number] DOB: February 22, 1946 DOA: 02/27/2020 PCP: Tresa Garter, MD    Brief Narrative:  74 year old female with dementia, DM 2, COPD, CHF, hyperlipidemia presented to hospital on 02/27/20 with altered mental status, high blood sugar, hematemesis.  Patient's daughter-in-law found her at home vomiting some blood.  Patient was seen in the ER and was found to have significant electrolyte imbalance with lactic acidosis, hyponatremia hyperkalemia, DKA, UTI, diabetic foot ulcer/cellulitis.  Patient has not been compliant with her insulin regimen, and family has been out of touch for few months-it appears patient has been noncompliant with medication, and also with complicated and fractured family dynamics.  In ER, CT head was negative for acute finding, chest x-ray unremarkable, MRI of the lower extremities no evidence of cellulitis EKG prolonged QTC. Patient was seen by GI underwent barium swallow that showed delayed passage of contrast at the GE junction and distal esophagus radiology concerned about pseudoachalasia and subsequently underwent  EGD 03/05/2020-found to have extensive esophagitis, gastritis and duodenitis.Biopsies were taken. No mass was seen at the GE junction. No dilatation was performed for the subtle mild stricture at GEJ due to the severe esophagitis. Recommendations are for PPI and sucralfate. The patient has been started on a clear liquid diet. The patient's DIL [name] stated that if the patient does not improve her PO intake, that she would be interested in hospice. DIL [name] has stated that both she and [name] are interested in a feeding tube. The patient no longer wants to talk to any of her family members about it and does not want feeding tube.    Assessment & Plan:   Active Problems:   DKA (diabetic ketoacidoses) ([place])   Dysphagia   Hematemesis with nausea  DKA/AGMA/Metabolic alkalosis:Resolved.Suspect secondary to UTI/diabetic  foot wound/noncompliance. Seems stable  Type 2 diabetes mellitus poorly controlled, with insulin noncompliance.  Hemoglobin A1c 10.1. Cont on Lantus 15 units bedtime (started 4/19), ssi and adjust insulin as oral intake picks up. Glycemic trends overall stable at this time, occasional values in the 200's  Hematemesis/extensive esophagitis, gastritis and duodenitis/nausea vomiting: Nausea vomiting resolved.  Was earlier by GI, underwent EGD, now continued on PPI.  Anorexia in the setting of uncontrolled diabetes, esophagitis gastritis and dementia-started on Megace, encourage oral intake.  Pt reportedly does not desire to have a feeding tube. Dietitian following, recommendation for nutritional supplements Per dietitian, intake shows to be progressing slightly. Continue to encourage PO intake  Foot wound diabetic versus vascular: on Lt Grt toe. healing well. Cont wound care. blood culture no growth. Monitor, cont wound care. ABI reviewed. Mild vascular disease on R, normal on L Stable  Suspected UTI, with abnormal UA on admission treated with IV antibiotics cultures no growth.  Now off abx  Hypokalemia/hypomagnesemia/hypophosphatemia- Potassium now replaced. Will repeat bmet in AM  History of COPD: Remains on room air and stable.  Chronic pain Pt is continued on Neurontin  Anxiety/insomnia on low-dose as needed Xanax  History of chronic diastolic CHF last ejection normal with findings consistent with diastoilc dysfunction. Currently not on lasix. Weight currently up to 100kg. Will give 40mg  IV lasix x 1  Hyponatremia in the setting of hyperglycemia, remains low, trending up.  Hypocalcemia: Noted to be resolved.   Noncompliance-counselled recently  Morbid obesity with BMI 34: Will definitely benefit with weight loss and healthy lifestyle but notes at home is feasible in the current setting.  SVT Brief and self limiting noted this afternoon  Will start low dose metoprolol  12.5mg  bid with hold parameters Stable at this time  Dementia Unsafe living condition reported by family over phone. Family had noticed gradual decline consistent with worsening dementia. Family is agreeable to short term SNF with plans to have pt move in with son after rehab stay SW following  DVT prophylaxis: SCD's Code Status: DNR Family Communication: pt in room, updated family over phone 4/22  Status is: Inpatient  Remains inpatient appropriate because:Unsafe d/c plan   Dispo: The patient is from: Home              Anticipated d/c is to: SNF              Anticipated d/c date is: 3 days              Patient currently is not medically stable to d/c.   Consultants:   GI  Palliative Care  Procedures:   EGD  Antimicrobials: Anti-infectives (From admission, onward)   Start     Dose/Rate Route Frequency Ordered Stop   02/28/20 0030  cefTRIAXone (ROCEPHIN) 1 g in sodium chloride 0.9 % 100 mL IVPB  Status:  Discontinued     1 g 200 mL/hr over 30 Minutes Intravenous Every 24 hours 02/28/20 0016 03/03/20 1304      Subjective: No complaints this AM  Objective: Vitals:   03/16/20 2107 03/17/20 0426 03/17/20 0857 03/17/20 1636  BP: 106/79 121/71 (!) 154/76 (!) 125/57  Pulse: 87 78 85 79  Resp: 18 18 18 18   Temp: 99.4 F (37.4 C) 98.5 F (36.9 C) 98.9 F (37.2 C) 98.5 F (36.9 C)  TempSrc:  Oral Oral   SpO2: 97% 97% 94% 93%  Weight: 100 kg     Height:        Intake/Output Summary (Last 24 hours) at 03/17/2020 1644 Last data filed at 03/17/2020 1300 Gross per 24 hour  Intake 957 ml  Output 1 ml  Net 956 ml   Filed Weights   03/14/20 0400 03/15/20 0500 03/16/20 2107  Weight: 85.7 kg 85.8 kg 100 kg    Examination: General exam: Conversant, in no acute distress Respiratory system: normal chest rise, clear, no audible wheezing Cardiovascular system: regular rhythm, s1-s2 Gastrointestinal system: Nondistended, nontender, pos BS Central nervous system: No  seizures, no tremors Extremities: No cyanosis, no joint deformities Skin: No rashes, no pallor Psychiatry: Affect normal // no auditory hallucinations   Data Reviewed: I have personally reviewed following labs and imaging studies  CBC: Recent Labs  Lab 03/12/20 0450 03/13/20 0214  WBC 11.4* 9.9  HGB 10.9* 11.0*  HCT 33.7* 33.4*  MCV 74.1* 71.8*  PLT 248 99991111   Basic Metabolic Panel: Recent Labs  Lab 03/11/20 1326 03/11/20 1326 03/12/20 0450 03/12/20 0450 03/13/20 0214 03/14/20 0300 03/15/20 1138 03/16/20 0348 03/17/20 0345  NA 132*   < > 128*   < > 131* 129* 135 129* 130*  K 3.5   < > 3.2*   < > 3.2* 3.4* 3.2* 4.4 5.2*  CL 96*   < > 91*   < > 93* 92* 98 95* 100  CO2 25   < > 27   < > 27 27 28 28  20*  GLUCOSE 195*   < > 309*   < > 327* 282* 111* 167* 170*  BUN 8   < > 10   < > 11 13 9 16 16   CREATININE 0.66   < > 0.73   < >  0.78 0.81 0.64 0.81 0.70  CALCIUM 8.7*   < > 8.7*   < > 8.9 8.8* 9.2 9.1 9.4  MG 1.4*  --  1.3*  --  1.6*  --   --   --  1.5*  PHOS  --   --  1.6*  --  1.7*  --   --   --   --    < > = values in this interval not displayed.   GFR: Estimated Creatinine Clearance: 67.2 mL/min (by C-G formula based on SCr of 0.7 mg/dL). Liver Function Tests: Recent Labs  Lab 03/13/20 0214 03/16/20 0348 03/17/20 0345  AST 24 20 29   ALT 16 16 17   ALKPHOS 82 70 69  BILITOT 0.3 0.2* 0.9  PROT 5.8* 5.2* 5.1*  ALBUMIN 2.4* 2.1* 2.1*   No results for input(s): LIPASE, AMYLASE in the last 168 hours. No results for input(s): AMMONIA in the last 168 hours. Coagulation Profile: No results for input(s): INR, PROTIME in the last 168 hours. Cardiac Enzymes: No results for input(s): CKTOTAL, CKMB, CKMBINDEX, TROPONINI in the last 168 hours. BNP (last 3 results) No results for input(s): PROBNP in the last 8760 hours. HbA1C: No results for input(s): HGBA1C in the last 72 hours. CBG: Recent Labs  Lab 03/16/20 1135 03/16/20 1658 03/17/20 0640 03/17/20 1119  03/17/20 1637  GLUCAP 187* 222* 151* 252* 183*   Lipid Profile: No results for input(s): CHOL, HDL, LDLCALC, TRIG, CHOLHDL, LDLDIRECT in the last 72 hours. Thyroid Function Tests: No results for input(s): TSH, T4TOTAL, FREET4, T3FREE, THYROIDAB in the last 72 hours. Anemia Panel: No results for input(s): VITAMINB12, FOLATE, FERRITIN, TIBC, IRON, RETICCTPCT in the last 72 hours. Sepsis Labs: No results for input(s): PROCALCITON, LATICACIDVEN in the last 168 hours.  No results found for this or any previous visit (from the past 240 hour(s)).   Radiology Studies: No results found.  Scheduled Meds: . collagenase   Topical Daily  . DULoxetine  30 mg Oral Daily  . feeding supplement (ENSURE ENLIVE)  237 mL Oral TID BM  . feeding supplement (PRO-STAT SUGAR FREE 64)  30 mL Oral BID  . gabapentin  100 mg Oral TID  . insulin aspart  0-15 Units Subcutaneous TID WC  . insulin aspart  0-5 Units Subcutaneous QHS  . insulin glargine  15 Units Subcutaneous QHS  . megestrol  200 mg Oral BID  . metoprolol tartrate  12.5 mg Oral BID  . multivitamin with minerals  1 tablet Oral Daily  . nystatin  5 mL Oral QID  . pantoprazole (PROTONIX) IV  40 mg Intravenous Q12H  . sodium chloride flush  10-40 mL Intracatheter Q12H  . sodium chloride  2 g Oral BID WC  . sucralfate  1 g Oral Q6H   Continuous Infusions: . sodium chloride 250 mL/hr at 03/08/20 0957     LOS: 18 days   Marylu Lund, MD [facility] Pager On Amion  If 7PM-7AM, please contact night-coverage 03/17/2020, 4:44 PM

## 2020-03-17 NOTE — Progress Notes (Signed)
PT Progress Note for Charges    03/17/20 1400  PT Visit Information  Last PT Received On 03/17/20  PT General Charges  $$ ACUTE PT VISIT 1 Visit  PT Treatments  $Therapeutic Activity 23-37 mins  Anastasio Champion, DPT  Acute Rehabilitation Services Pager [phone] Office [phone]

## 2020-03-17 NOTE — NC FL2 (Signed)
[place] MEDICAID FL2 LEVEL OF CARE SCREENING TOOL     IDENTIFICATION  Patient Name: Yvonne [name] Birthdate: Jan 18, 1946 Sex: female Admission Date (Current Location): 02/27/2020  [place] and [place] Number:  Kathleen Argue [id number] [place] and Address:  [facility], [place] [address] [place],  [address]      Provider Number: [id number]  Attending Physician Name and Address:  Donne Hazel, MD  Relative Name and Phone Number:  [name] Kemper - daughter-in-law - [phone]; Antonea Langman - son - [phone]    Current Level of Care: Hospital Recommended Level of Care: [place] Prior Approval Number:    Date Approved/Denied:   PASRR Number: (Submitted for PASRR 4/23 - MUST ID [id number])  Discharge Plan: SNF    Current Diagnoses: Patient Active Problem List   Diagnosis Date Noted  . Dysphagia   . Hematemesis with nausea   . DKA (diabetic ketoacidoses) ([place]) 02/28/2020  . Altered mental status   . Dehydration   . Hypokalemia   . Diabetic neuropathy, painful ([place]) 07/20/2015  . Insomnia due to anxiety and fear 07/20/2015  . Anxiety disorder 10/03/2014  . Preventative health care 10/03/2014  . Type 2 diabetes mellitus without complication ([place]) 0000000  . Depression 06/23/2014  . Essential hypertension 06/23/2014  . Peripheral neuropathic pain 06/23/2014  . Gastroesophageal reflux disease without esophagitis 06/23/2014  . Dyslipidemia 06/23/2014  . Chronic neck pain 03/09/2012  . Right arm pain 03/09/2012  . Chronic low back pain 03/09/2012  . Right knee pain 03/09/2012  . Joint stiffness of hand 03/09/2012  . Encephalopathy 02/05/2012  . Hypoxia 02/01/2012  . Fever 02/01/2012  . Leukocytosis 02/01/2012  . Bronchitis 02/01/2012  . Confusion 02/01/2012  . Diarrhea 02/01/2012  . COPD (chronic obstructive pulmonary disease) ([place]) 02/01/2012  . CAP (community acquired pneumonia) 02/01/2012  . Diabetes mellitus  ([place])     Orientation RESPIRATION BLADDER Height & Weight     Self  Normal Incontinent Weight: 220 lb 7.4 oz (100 kg)(RN aware of change) Height:  5\' 2"  (157.5 cm)  BEHAVIORAL SYMPTOMS/MOOD NEUROLOGICAL BOWEL NUTRITION STATUS      Incontinent Diet(DYS 3-mechanical soft, thin liquids)  AMBULATORY STATUS COMMUNICATION OF NEEDS Skin   Total Care(Has not ambulated with PT) Verbally Other (Comment)(Foot ulcer-left foot, enzymatic debridement ointment and dry dressing; Ecchymosis left/right arm; MASD groin, buttocks and perineum-treated with barrier cream)                       Personal Care Assistance Level of Assistance  Bathing, Feeding, Dressing Bathing Assistance: Maximum assistance(Mod assist per PT) Feeding assistance: Limited assistance(Assistance with set-up) Dressing Assistance: Maximum assistance(Mod assist per PT)     Functional Limitations Info  Sight, Hearing, Speech Sight Info: Adequate Hearing Info: Adequate Speech Info: Adequate    SPECIAL CARE FACTORS FREQUENCY  PT (By licensed PT), OT (By licensed OT), Speech therapy     PT Frequency: Evaluated 4/14. PT at [facility] Eval and Treat, a minimum of 5 days per week OT Frequency: Evaluated 4/14. OT at [facility] Eval and Treat, a minimum of 5 days per week     Speech Therapy Frequency: Evaluated on 4/15      Contractures Contractures Info: Not present    Additional Factors Info  Code Status, Allergies Code Status Info: DNR Allergies Info: No known allergies           Current Medications (03/17/2020):  This is the current hospital active medication list  Current Facility-Administered Medications  Medication Dose Route Frequency Provider Last Rate Last Admin  . ALPRAZolam (XANAX) tablet 0.25 mg  0.25 mg Oral Daily PRN Swayze, Ava, DO      . collagenase (SANTYL) ointment   Topical Daily Donne Hazel, MD   Given at 03/17/20 1201  . DULoxetine (CYMBALTA) DR capsule 30 mg  30 mg Oral Daily Gilles Chiquito B, MD   30 mg  at 03/17/20 0912  . feeding supplement (ENSURE ENLIVE) (ENSURE ENLIVE) liquid 237 mL  237 mL Oral TID BM Shawna Clamp, MD   237 mL at 03/17/20 1323  . feeding supplement (PRO-STAT SUGAR FREE 64) liquid 30 mL  30 mL Oral BID Donne Hazel, MD   30 mL at 03/17/20 [id number]  . gabapentin (NEURONTIN) capsule 100 mg  100 mg Oral TID Swayze, Ava, DO   100 mg at 03/17/20 0912  . Gerhardt's butt cream   Topical Daily PRN Antonieta Pert, MD   1 application at [id number]  . insulin aspart (novoLOG) injection 0-15 Units  0-15 Units Subcutaneous TID WC Shawna Clamp, MD   8 Units at 03/17/20 1145  . insulin aspart (novoLOG) injection 0-5 Units  0-5 Units Subcutaneous QHS Shawna Clamp, MD   2 Units at 03/16/20 2158  . insulin glargine (LANTUS) injection 15 Units  15 Units Subcutaneous QHS Antonieta Pert, MD   15 Units at 03/16/20 2150  . magic mouthwash w/lidocaine  5 mL Oral TID PRN Shawna Clamp, MD      . megestrol (MEGACE) 400 MG/10ML suspension 200 mg  200 mg Oral BID Swayze, Ava, DO   200 mg at 03/17/20 0912  . metoprolol tartrate (LOPRESSOR) tablet 12.5 mg  12.5 mg Oral BID Donne Hazel, MD   12.5 mg at 03/17/20 0913  . multivitamin with minerals tablet 1 tablet  1 tablet Oral Daily Shawna Clamp, MD   1 tablet at 03/17/20 0912  . nystatin (MYCOSTATIN) 100000 UNIT/ML suspension 500,000 Units  5 mL Oral QID Swayze, Ava, DO   500,000 Units at 03/17/20 1322  . ondansetron (ZOFRAN) injection 4 mg  4 mg Intravenous Q6H PRN Swayze, Ava, DO   4 mg at 03/05/20 1554  . pantoprazole (PROTONIX) injection 40 mg  40 mg Intravenous Q12H Yetta Flock, MD   40 mg at 03/17/20 0911  . prochlorperazine (COMPAZINE) injection 10 mg  10 mg Intravenous Q4H PRN Swayze, Ava, DO   10 mg at 03/05/20 1658  . sodium chloride 0.9 % bolus 500 mL  500 mL Intravenous Once Swayze, Ava, DO 250 mL/hr at 03/08/20 0957 Rate Change at 03/08/20 0957  . sodium chloride flush (NS) 0.9 % injection 10-40 mL  10-40 mL Intracatheter Q12H  Swayze, Ava, DO   10 mL at 03/17/20 1133  . sodium chloride flush (NS) 0.9 % injection 10-40 mL  10-40 mL Intracatheter PRN Swayze, Ava, DO      . sodium chloride tablet 2 g  2 g Oral BID WC Swayze, Ava, DO   2 g at 03/17/20 1154  . sucralfate (CARAFATE) 1 GM/10ML suspension 1 g  1 g Oral Q6H Armbruster, Carlota Raspberry, MD   1 g at 03/17/20 [id number]     Discharge Medications: Please see discharge summary for a list of discharge medications.  Relevant Imaging Results:  Relevant Lab Results:   Additional Information [id number]  Sable Feil, LCSW
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
## 2020-03-17 NOTE — Plan of Care (Signed)
  Problem: Activity: Goal: Risk for activity intolerance will decrease Outcome: Progressing   

## 2020-03-17 NOTE — Progress Notes (Signed)
Physical Therapy Treatment Patient Details Name: Yvonne [name] MRN: [id number] DOB: [birth year] Today's Date: 03/17/2020    History of Present Illness Pt is a 74 yo female s/p UTI, diabetic foot ulcer, DKA and cellulitis. EGD on 4/11 revealing esophagitis, gastritis, and duodenitis. PMHx: dementia, T2DM, COPD, CHF,     PT Comments    Session limited d/t pt's cognition and resistance to activity. Pt initially resistant to bed mobility but was willing to sit EOB after multiple requests. She was minA for bed mobility, requiring assistance to pull trunk upright. She sat EOB for ~73min before requesting to lay back down. She was minA to roll L & R to allow PT's to change wet bedpad. Pt requires patience, understanding, and frequent commands/reminders for inc participation. Throughout first half pt very domineering stating "I do what I want"and "you dont tell me to do that" but became more participative and willing for remainder of session ending with "Thank you baby, love you". Hopeful to progress mobility and activity tolerance in future sessions with inc pt willingness. Pt would continue to benefit from skilled physical therapy services at this time while admitted and after d/c to address the below listed limitations in order to improve overall safety and independence with functional mobility.   Follow Up Recommendations  SNF;Supervision/Assistance - 24 hour     Equipment Recommendations  Other (comment)(defer to next venue of care)    Recommendations for Other Services       Precautions / Restrictions Precautions Precautions: Fall Precaution Comments: Pt resistant to most all activity Restrictions Weight Bearing Restrictions: No    Mobility  Bed Mobility Overal bed mobility: Needs Assistance Bed Mobility: Supine to Sit;Sit to Supine;Rolling Rolling: Min assist Sidelying to sit: Min assist Supine to sit: Min assist     General bed mobility comments: Pt initially resistant to bed  mobility, but after multiple commands and PT initiation and inc time, pt was able to get sitting EOB with minA to pull trunk upright  Transfers                 General transfer comment: Pt resistant to further mobility  Ambulation/Gait                 Stairs             Wheelchair Mobility    Modified Rankin (Stroke Patients Only)       Balance Overall balance assessment: Needs assistance Sitting-balance support: No upper extremity supported;Feet supported Sitting balance-Leahy Scale: Fair Sitting balance - Comments: pt able to sit and take drink from can without LOB                                    Cognition Arousal/Alertness: Awake/alert Behavior During Therapy: Flat affect;Agitated Overall Cognitive Status: No family/caregiver present to determine baseline cognitive functioning Area of Impairment: Orientation;Attention;Memory;Following commands;Safety/judgement;Awareness;Problem solving                 Orientation Level: Disoriented to;Situation Current Attention Level: Selective Memory: Decreased short-term memory Following Commands: Follows one step commands inconsistently;Follows one step commands with increased time Safety/Judgement: Decreased awareness of safety;Decreased awareness of deficits Awareness: Emergent Problem Solving: Decreased initiation;Difficulty sequencing;Requires verbal cues;Requires tactile cues General Comments: Hx of dementia, asking questions about a house cat, rabbit, and asking about her mama. She is very vocal and uncoorperative with most activity stating "I do what I WANT to  do, my way", warmed up to PT's by end of session      Exercises      General Comments General comments (skin integrity, edema, etc.): VSS      Pertinent Vitals/Pain Pain Assessment: Faces Faces Pain Scale: No hurt    Home Living                      Prior Function            PT Goals (current goals can  now be found in the care plan section) Acute Rehab PT Goals PT Goal Formulation: With patient Time For Goal Achievement: 03/22/20 Potential to Achieve Goals: Good Progress towards PT goals: Progressing toward goals    Frequency    Min 2X/week      PT Plan Current plan remains appropriate    Co-evaluation              AM-PAC PT "6 Clicks" Mobility   Outcome Measure  Help needed turning from your back to your side while in a flat bed without using bedrails?: A Lot Help needed moving from lying on your back to sitting on the side of a flat bed without using bedrails?: A Lot Help needed moving to and from a bed to a chair (including a wheelchair)?: A Lot Help needed standing up from a chair using your arms (e.g., wheelchair or bedside chair)?: A Lot Help needed to walk in hospital room?: Total Help needed climbing 3-5 steps with a railing? : Total 6 Click Score: 10    End of Session   Activity Tolerance: Other (comment)(limited by cognition and reistance to mobility) Patient left: in bed;with call bell/phone within reach;with bed alarm set Nurse Communication: Mobility status PT Visit Diagnosis: Unsteadiness on feet (R26.81);Other abnormalities of gait and mobility (R26.89);Muscle weakness (generalized) (M62.81)     Time: [id number] PT Time Calculation (min) (ACUTE ONLY): 26 min  Charges:  $Therapeutic Activity: 23-37 mins                    Antwonette Feliz, SPT Acute Rehab  [id number]   Antoinette Borgwardt 03/17/2020, 2:10 PM

## 2020-03-17 NOTE — Progress Notes (Signed)
Spoke with DIL [name] on the phone, she reiterate that patient will go to temporary rehabilitation only to get better and go home to family. per [name] " I dont want misunderstanding about my mother in [place] care, temporary rehab only, no full time SNF placement, she will go home to [name], her family.can u make a note of that".  Asked if she can speak to the patient. gave her room phone no. , she was able to speak with the patient.

## 2020-03-18 LAB — COMPREHENSIVE METABOLIC PANEL
ALT: 20 U/L (ref 0–44)
AST: 20 U/L (ref 15–41)
Albumin: 2.3 g/dL — ABNORMAL LOW (ref 3.5–5.0)
Alkaline Phosphatase: 75 U/L (ref 38–126)
Anion gap: 7 (ref 5–15)
BUN: 17 mg/dL (ref 8–23)
CO2: 23 mmol/L (ref 22–32)
Calcium: 9.2 mg/dL (ref 8.9–10.3)
Chloride: 99 mmol/L (ref 98–111)
Creatinine, Ser: 0.68 mg/dL (ref 0.44–1.00)
GFR calc Af Amer: >60 mL/min (ref >60)
GFR calc non Af Amer: >60 mL/min (ref >60)
Glucose, Bld: 257 mg/dL — ABNORMAL HIGH (ref 70–99)
Potassium: 4.6 mmol/L (ref 3.5–5.1)
Sodium: 129 mmol/L — ABNORMAL LOW (ref 135–145)
Total Bilirubin: 0.3 mg/dL (ref 0.3–1.2)
Total Protein: 5.3 g/dL — ABNORMAL LOW (ref 6.5–8.1)

## 2020-03-18 LAB — GLUCOSE, CAPILLARY
Glucose-Capillary: 231 mg/dL — ABNORMAL HIGH (ref 70–99)
Glucose-Capillary: 244 mg/dL — ABNORMAL HIGH (ref 70–99)
Glucose-Capillary: 299 mg/dL — ABNORMAL HIGH (ref 70–99)
Glucose-Capillary: 203 mg/dL — ABNORMAL HIGH (ref 70–99)

## 2020-03-18 LAB — MAGNESIUM: Magnesium: 2.3 mg/dL (ref 1.7–2.4)

## 2020-03-18 MED ORDER — FUROSEMIDE 10 MG/ML IJ SOLN
40.0000 mg | Freq: Two times a day (BID) | INTRAMUSCULAR | Status: DC
  Administered 2020-03-18 – 2020-03-22 (×8): 40 mg via INTRAVENOUS
  Filled 2020-03-18 (×8): qty 4

## 2020-03-18 MED ORDER — INSULIN GLARGINE 100 UNIT/ML ~~LOC~~ SOLN
20.0000 [IU] | Freq: Every day | SUBCUTANEOUS | Status: DC
  Administered 2020-03-18: 20 [IU] via SUBCUTANEOUS
  Filled 2020-03-18 (×2): qty 0.2

## 2020-03-18 NOTE — Progress Notes (Signed)
PROGRESS NOTE    Yvonne [name]  [id number] DOB: 10/04/1946 DOA: 02/27/2020 PCP: Tresa Garter, MD    Brief Narrative:  74 year old female with dementia, DM 2, COPD, CHF, hyperlipidemia presented to hospital on 02/27/20 with altered mental status, high blood sugar, hematemesis.  Patient's daughter-in-law found her at home vomiting some blood.  Patient was seen in the ER and was found to have significant electrolyte imbalance with lactic acidosis, hyponatremia hyperkalemia, DKA, UTI, diabetic foot ulcer/cellulitis.  Patient has not been compliant with her insulin regimen, and family has been out of touch for few months-it appears patient has been noncompliant with medication, and also with complicated and fractured family dynamics.  In ER, CT head was negative for acute finding, chest x-ray unremarkable, MRI of the lower extremities no evidence of cellulitis EKG prolonged QTC. Patient was seen by GI underwent barium swallow that showed delayed passage of contrast at the GE junction and distal esophagus radiology concerned about pseudoachalasia and subsequently underwent  EGD 03/05/2020-found to have extensive esophagitis, gastritis and duodenitis.Biopsies were taken. No mass was seen at the GE junction. No dilatation was performed for the subtle mild stricture at GEJ due to the severe esophagitis. Recommendations are for PPI and sucralfate. The patient has been started on a clear liquid diet. The patient's DIL [name] stated that if the patient does not improve her PO intake, that she would be interested in hospice. DIL [name] has stated that both she and [name] are interested in a feeding tube. The patient no longer wants to talk to any of her family members about it and does not want feeding tube.    Assessment & Plan:   Active Problems:   DKA (diabetic ketoacidoses) ([place])   Dysphagia   Hematemesis with nausea  DKA/AGMA/Metabolic alkalosis:Resolved.Suspect secondary to UTI/diabetic  foot wound/noncompliance. Seems stable at this time  Type 2 diabetes mellitus poorly controlled, with insulin noncompliance.  Hemoglobin A1c 10.1.  bedtime (started 4/19), ssi and adjust insulin as oral intake picks up. Glycemic trends in the 200's. Increased lantus to 20 units  Hematemesis/extensive esophagitis, gastritis and duodenitis/nausea vomiting: Nausea vomiting resolved.  Was earlier by GI, underwent EGD, now continued on PPI.  Anorexia in the setting of uncontrolled diabetes, esophagitis gastritis and dementia-started on Megace, encourage oral intake.  Pt reportedly does not desire to have a feeding tube. Dietitian following, recommendation for nutritional supplements Per dietitian, intake shows to be progressing slightly. Continue to encourage PO intake as tolerated  Foot wound diabetic versus vascular: on Lt Grt toe. healing well. Cont wound care. blood culture no growth. Monitor, cont wound care. ABI reviewed. Mild vascular disease on R, normal on L Stable at this time  Suspected UTI, with abnormal UA on admission treated with IV antibiotics cultures no growth.  Continued off abx  Hypokalemia/hypomagnesemia/hypophosphatemia- Potassium now replaced. Follow bmet in AM  History of COPD: Remains on room air and stable.  Chronic pain Pt is continued on Neurontin  Anxiety/insomnia on low-dose as needed Xanax  Acute on chronic diastolic CHF last ejection normal with findings consistent with diastoilc dysfunction. Currently not on lasix. Weight currently up to 101kg. BLE edema noted on exam. Will start 40mg  IV BID lasix  Hyponatremia in the setting of hyperglycemia, remains low, suspect related to volume overload per above. Now on lasix  Hypocalcemia: Noted to be resolved.   Noncompliance-counselled recently  Morbid obesity with BMI 34: Will definitely benefit with weight loss and healthy lifestyle but notes at home is feasible  in the current setting.  SVT Brief  and self limiting noted this afternoon Will start low dose metoprolol 12.5mg  bid with hold parameters Stable at this time  Dementia Unsafe living condition reported by family over phone. Family had noticed gradual decline consistent with worsening dementia. Family is agreeable to short term SNF with plans to have pt move in with son after rehab stay SW following  DVT prophylaxis: SCD's Code Status: DNR Family Communication: pt in room, updated family over phone 4/22  Status is: Inpatient  Remains inpatient appropriate because:Unsafe d/c plan and IV treatments appropriate due to intensity of illness or inability to take PO   Dispo: The patient is from: Home              Anticipated d/c is to: SNF              Anticipated d/c date is: 3 days              Patient currently is not medically stable to d/c.   Consultants:   GI  Palliative Care  Procedures:   EGD  Antimicrobials: Anti-infectives (From admission, onward)   Start     Dose/Rate Route Frequency Ordered Stop   02/28/20 0030  cefTRIAXone (ROCEPHIN) 1 g in sodium chloride 0.9 % 100 mL IVPB  Status:  Discontinued     1 g 200 mL/hr over 30 Minutes Intravenous Every 24 hours 02/28/20 0016 03/03/20 1304      Subjective: Without complaints  Objective: Vitals:   03/17/20 1636 03/17/20 2056 03/18/20 0436 03/18/20 0910  BP: (!) 125/57 114/60 110/73 122/66  Pulse: 79 90 75 84  Resp: 18 18 15 18   Temp: 98.5 F (36.9 C) 98 F (36.7 C) 98.6 F (37 C) 99.1 F (37.3 C)  TempSrc:  Oral Oral Oral  SpO2: 93% 96% 97% 98%  Weight:  101 kg    Height:        Intake/Output Summary (Last 24 hours) at 03/18/2020 1652 Last data filed at 03/18/2020 1400 Gross per 24 hour  Intake 1920 ml  Output 1400 ml  Net 520 ml   Filed Weights   03/15/20 0500 03/16/20 2107 03/17/20 2056  Weight: 85.8 kg 100 kg 101 kg    Examination: General exam: Awake, laying in bed, in nad Respiratory system: Normal respiratory effort, no  wheezing Cardiovascular system: regular rate, s1, s2 Gastrointestinal system: Soft, nondistended, positive BS Central nervous system: CN2-12 grossly intact, strength intact Extremities: Perfused, no clubbing, BLE edema Skin: Normal skin turgor, no notable skin lesions seen Psychiatry: Mood normal // no visual hallucinations   Data Reviewed: I have personally reviewed following labs and imaging studies  CBC: Recent Labs  Lab 03/12/20 0450 03/13/20 0214  WBC 11.4* 9.9  HGB 10.9* 11.0*  HCT 33.7* 33.4*  MCV 74.1* 71.8*  PLT 248 99991111   Basic Metabolic Panel: Recent Labs  Lab 03/12/20 0450 03/12/20 0450 03/13/20 0214 03/13/20 0214 03/14/20 0300 03/15/20 1138 03/16/20 0348 03/17/20 0345 03/18/20 0645  NA 128*   < > 131*   < > 129* 135 129* 130* 129*  K 3.2*   < > 3.2*   < > 3.4* 3.2* 4.4 5.2* 4.6  CL 91*   < > 93*   < > 92* 98 95* 100 99  CO2 27   < > 27   < > 27 28 28  20* 23  GLUCOSE 309*   < > 327*   < > 282* 111* 167* 170*  257*  BUN 10   < > 11   < > 13 9 16 16 17   CREATININE 0.73   < > 0.78   < > 0.81 0.64 0.81 0.70 0.68  CALCIUM 8.7*   < > 8.9   < > 8.8* 9.2 9.1 9.4 9.2  MG 1.3*  --  1.6*  --   --   --   --  1.5* 2.3  PHOS 1.6*  --  1.7*  --   --   --   --   --   --    < > = values in this interval not displayed.   GFR: Estimated Creatinine Clearance: 67.6 mL/min (by C-G formula based on SCr of 0.68 mg/dL). Liver Function Tests: Recent Labs  Lab 03/13/20 0214 03/16/20 0348 03/17/20 0345 03/18/20 0645  AST 24 20 29 20   ALT 16 16 17 20   ALKPHOS 82 70 69 75  BILITOT 0.3 0.2* 0.9 0.3  PROT 5.8* 5.2* 5.1* 5.3*  ALBUMIN 2.4* 2.1* 2.1* 2.3*   No results for input(s): LIPASE, AMYLASE in the last 168 hours. No results for input(s): AMMONIA in the last 168 hours. Coagulation Profile: No results for input(s): INR, PROTIME in the last 168 hours. Cardiac Enzymes: No results for input(s): CKTOTAL, CKMB, CKMBINDEX, TROPONINI in the last 168 hours. BNP (last 3  results) No results for input(s): PROBNP in the last 8760 hours. HbA1C: No results for input(s): HGBA1C in the last 72 hours. CBG: Recent Labs  Lab 03/17/20 1119 03/17/20 1637 03/17/20 2056 03/18/20 0631 03/18/20 1124  GLUCAP 252* 183* 275* 231* 244*   Lipid Profile: No results for input(s): CHOL, HDL, LDLCALC, TRIG, CHOLHDL, LDLDIRECT in the last 72 hours. Thyroid Function Tests: No results for input(s): TSH, T4TOTAL, FREET4, T3FREE, THYROIDAB in the last 72 hours. Anemia Panel: No results for input(s): VITAMINB12, FOLATE, FERRITIN, TIBC, IRON, RETICCTPCT in the last 72 hours. Sepsis Labs: No results for input(s): PROCALCITON, LATICACIDVEN in the last 168 hours.  No results found for this or any previous visit (from the past 240 hour(s)).   Radiology Studies: No results found.  Scheduled Meds: . collagenase   Topical Daily  . DULoxetine  30 mg Oral Daily  . feeding supplement (ENSURE ENLIVE)  237 mL Oral TID BM  . feeding supplement (PRO-STAT SUGAR FREE 64)  30 mL Oral BID  . furosemide  40 mg Intravenous BID  . gabapentin  100 mg Oral TID  . insulin aspart  0-15 Units Subcutaneous TID WC  . insulin aspart  0-5 Units Subcutaneous QHS  . insulin glargine  20 Units Subcutaneous QHS  . megestrol  200 mg Oral BID  . metoprolol tartrate  12.5 mg Oral BID  . multivitamin with minerals  1 tablet Oral Daily  . nystatin  5 mL Oral QID  . pantoprazole (PROTONIX) IV  40 mg Intravenous Q12H  . sodium chloride flush  10-40 mL Intracatheter Q12H  . sodium chloride  2 g Oral BID WC  . sucralfate  1 g Oral Q6H   Continuous Infusions: . sodium chloride 250 mL/hr at 03/08/20 0957     LOS: 19 days   Marylu Lund, MD [facility] Pager On Amion  If 7PM-7AM, please contact night-coverage 03/18/2020, 4:52 PM

## 2020-03-18 NOTE — Plan of Care (Signed)
  Problem: Activity: Goal: Risk for activity intolerance will decrease Outcome: Progressing   Problem: Nutrition: Goal: Adequate nutrition will be maintained Outcome: Progressing   

## 2020-03-18 NOTE — TOC Progression Note (Addendum)
Transition of Care [facility]) - Progression Note    Patient Details  Name: Yvonne [name] MRN: [id number] Date of Birth: 06/28/1946  Transition of Care [facility]) CM/SW [place], [place] Phone Number: 03/18/2020, 3:02 PM  Clinical Narrative:    CSW spoke with patient's daughter-in-law [name] to provide bed offers. [name] expressed she would like CSW to follow-up with [facility] on Monday for bed availability. [name] expressed the next option for discharge may be home if [employer] is not an option.  Warehouse manager [phone]. CSW will continue to follow.   Expected Discharge Plan: [place] Barriers to Discharge: Continued Medical Work up  Expected Discharge Plan and Services Expected Discharge Plan: [place] In-house Referral: Clinical Social Work(TOC referral)     Living arrangements for the past 2 months: Apartment                                       Social Determinants of Health (SDOH) Interventions    Readmission Risk Interventions No flowsheet data found.

## 2020-03-19 LAB — COMPREHENSIVE METABOLIC PANEL
ALT: 20 U/L (ref 0–44)
AST: 28 U/L (ref 15–41)
Albumin: 2.4 g/dL — ABNORMAL LOW (ref 3.5–5.0)
Alkaline Phosphatase: 77 U/L (ref 38–126)
Anion gap: 11 (ref 5–15)
BUN: 17 mg/dL (ref 8–23)
CO2: 19 mmol/L — ABNORMAL LOW (ref 22–32)
Calcium: 9.4 mg/dL (ref 8.9–10.3)
Chloride: 100 mmol/L (ref 98–111)
Creatinine, Ser: 0.58 mg/dL (ref 0.44–1.00)
GFR calc Af Amer: >60 mL/min (ref >60)
GFR calc non Af Amer: >60 mL/min (ref >60)
Glucose, Bld: 146 mg/dL — ABNORMAL HIGH (ref 70–99)
Potassium: 5.1 mmol/L (ref 3.5–5.1)
Sodium: 130 mmol/L — ABNORMAL LOW (ref 135–145)
Total Bilirubin: 0.7 mg/dL (ref 0.3–1.2)
Total Protein: 5.8 g/dL — ABNORMAL LOW (ref 6.5–8.1)

## 2020-03-19 LAB — GLUCOSE, CAPILLARY
Glucose-Capillary: 139 mg/dL — ABNORMAL HIGH (ref 70–99)
Glucose-Capillary: 190 mg/dL — ABNORMAL HIGH (ref 70–99)
Glucose-Capillary: 252 mg/dL — ABNORMAL HIGH (ref 70–99)
Glucose-Capillary: 229 mg/dL — ABNORMAL HIGH (ref 70–99)

## 2020-03-19 MED ORDER — INSULIN GLARGINE 100 UNIT/ML ~~LOC~~ SOLN
28.0000 [IU] | Freq: Every day | SUBCUTANEOUS | Status: DC
  Administered 2020-03-20: 28 [IU] via SUBCUTANEOUS
  Filled 2020-03-19 (×2): qty 0.28

## 2020-03-19 MED ORDER — INSULIN ASPART 100 UNIT/ML ~~LOC~~ SOLN
5.0000 [IU] | Freq: Three times a day (TID) | SUBCUTANEOUS | Status: DC
  Administered 2020-03-19 – 2020-03-20 (×3): 5 [IU] via SUBCUTANEOUS

## 2020-03-19 NOTE — Plan of Care (Signed)
  Problem: Activity: Goal: Risk for activity intolerance will decrease Outcome: Progressing   Problem: Nutrition: Goal: Adequate nutrition will be maintained Outcome: Progressing   

## 2020-03-19 NOTE — Progress Notes (Signed)
PROGRESS NOTE    Yvonne [name]  [id number] DOB: 31-Dec-1945 DOA: 02/27/2020 PCP: Tresa Garter, MD    Brief Narrative:  74 year old female with dementia, DM 2, COPD, CHF, hyperlipidemia presented to hospital on 02/27/20 with altered mental status, high blood sugar, hematemesis.  Patient's daughter-in-law found her at home vomiting some blood.  Patient was seen in the ER and was found to have significant electrolyte imbalance with lactic acidosis, hyponatremia hyperkalemia, DKA, UTI, diabetic foot ulcer/cellulitis.  Patient has not been compliant with her insulin regimen, and family has been out of touch for few months-it appears patient has been noncompliant with medication, and also with complicated and fractured family dynamics.  In ER, CT head was negative for acute finding, chest x-ray unremarkable, MRI of the lower extremities no evidence of cellulitis EKG prolonged QTC. Patient was seen by GI underwent barium swallow that showed delayed passage of contrast at the GE junction and distal esophagus radiology concerned about pseudoachalasia and subsequently underwent  EGD 03/05/2020-found to have extensive esophagitis, gastritis and duodenitis.Biopsies were taken. No mass was seen at the GE junction. No dilatation was performed for the subtle mild stricture at GEJ due to the severe esophagitis. Recommendations are for PPI and sucralfate. The patient has been started on a clear liquid diet. The patient's DIL Yvonne [name] stated that if the patient does not improve her PO intake, that she would be interested in hospice. DIL Yvonne [name] has stated that both she and Yvonne [name] are interested in a feeding tube. The patient no longer wants to talk to any of her family members about it and does not want feeding tube.    Assessment & Plan:   Active Problems:   DKA (diabetic ketoacidoses) ([place])   Dysphagia   Hematemesis with nausea  DKA/AGMA/Metabolic alkalosis:Resolved.Suspect secondary to UTI/diabetic  foot wound/noncompliance. Seems stable currently  Type 2 diabetes mellitus poorly controlled, with insulin noncompliance.  Hemoglobin A1c 10.1.  bedtime (started 4/19), ssi and adjust insulin as oral intake picks up. Glycemic trends remains in the 200's. Increased lantus to 28 units and add 5 units meal coverage  Hematemesis/extensive esophagitis, gastritis and duodenitis/nausea vomiting: Nausea vomiting resolved.  Was earlier by GI, underwent EGD, now continued on PPI as tolerated  Anorexia in the setting of uncontrolled diabetes, esophagitis gastritis and dementia-started on Megace, encourage oral intake.  Pt reportedly does not desire to have a feeding tube. Dietitian following, recommendation for nutritional supplements Per dietitian, intake shows to be progressing slightly. Continue to encourage PO as tolerated  Foot wound diabetic versus vascular: on Lt Grt toe. healing well. Cont wound care. blood culture no growth. Monitor, cont wound care. ABI reviewed. Mild vascular disease on R, normal on L Stable at this time  Suspected UTI, with abnormal UA on admission treated with IV antibiotics cultures no growth.  Continued off abx  Hypokalemia/hypomagnesemia/hypophosphatemia- Potassium now replaced. Repeat bmet in AM  History of COPD: Remains on room air and stable.  Chronic pain Pt is continued on Neurontin  Anxiety/insomnia on low-dose as needed Xanax  Acute on chronic diastolic CHF last ejection normal with findings consistent with diastoilc dysfunction. Weight currently up to 102kg. BLE edema noted on exam. Tolerating 40mg  IV BID lasix with very good urine output thus far. Follow bmet in AM  Hyponatremia in the setting of hyperglycemia, remains low, suspect related to volume overload per above. Improving with lasix  Hypocalcemia: Noted to be resolved.   Noncompliance-counselled recently  Morbid obesity with BMI 34: Will definitely benefit  with weight loss and healthy  lifestyle but notes at home is feasible in the current setting.  SVT Brief and self limiting noted this afternoon Will start low dose metoprolol 12.5mg  bid with hold parameters Stable at this time  Dementia Unsafe living condition reported by family over phone. Family had noticed gradual decline consistent with worsening dementia. Family is agreeable to short term SNF with plans to have pt move in with son after rehab stay SW following  DVT prophylaxis: SCD's Code Status: DNR Family Communication: pt in room, updated family over phone 4/22  Status is: Inpatient  Remains inpatient appropriate because:Unsafe d/c plan and IV treatments appropriate due to intensity of illness or inability to take PO   Dispo: The patient is from: Home              Anticipated d/c is to: SNF              Anticipated d/c date is: 3 days              Patient currently is not medically stable to d/c.   Consultants:   GI  Palliative Care  Procedures:   EGD  Antimicrobials: Anti-infectives (From admission, onward)   Start     Dose/Rate Route Frequency Ordered Stop   02/28/20 0030  cefTRIAXone (ROCEPHIN) 1 g in sodium chloride 0.9 % 100 mL IVPB  Status:  Discontinued     1 g 200 mL/hr over 30 Minutes Intravenous Every 24 hours 02/28/20 0016 03/03/20 1304      Subjective: No complaints this AM.   Objective: Vitals:   03/18/20 2038 03/19/20 0417 03/19/20 0919 03/19/20 1613  BP: (!) 110/57 134/67 127/76 123/67  Pulse: 91 73 91 75  Resp: 18 16 18 18   Temp: 99.1 F (37.3 C) 99.3 F (37.4 C) 98.5 F (36.9 C) 97.8 F (36.6 C)  TempSrc: Oral Oral Oral Oral  SpO2: 97% 99%  100%  Weight: 102 kg     Height:        Intake/Output Summary (Last 24 hours) at 03/19/2020 1618 Last data filed at 03/19/2020 1300 Gross per 24 hour  Intake 1080 ml  Output 1300 ml  Net -220 ml   Filed Weights   03/16/20 2107 03/17/20 2056 03/18/20 2038  Weight: 100 kg 101 kg 102 kg    Examination: General  exam: Conversant, in no acute distress Respiratory system: normal chest rise, clear, no audible wheezing Cardiovascular system: regular rhythm, s1-s2 Gastrointestinal system: Nondistended, nontender, pos BS Central nervous system: No seizures, no tremors Extremities: No cyanosis, no joint deformities, BLE edema improving Skin: No rashes, no pallor Psychiatry: Affect normal // no auditory hallucinations    Data Reviewed: I have personally reviewed following labs and imaging studies  CBC: Recent Labs  Lab 03/13/20 0214  WBC 9.9  HGB 11.0*  HCT 33.4*  MCV 71.8*  PLT 99991111   Basic Metabolic Panel: Recent Labs  Lab 03/13/20 0214 03/14/20 0300 03/15/20 1138 03/16/20 0348 03/17/20 0345 03/18/20 0645 03/19/20 0825  NA 131*   < > 135 129* 130* 129* 130*  K 3.2*   < > 3.2* 4.4 5.2* 4.6 5.1  CL 93*   < > 98 95* 100 99 100  CO2 27   < > 28 28 20* 23 19*  GLUCOSE 327*   < > 111* 167* 170* 257* 146*  BUN 11   < > 9 16 16 17 17   CREATININE 0.78   < > 0.64 0.81  0.70 0.68 0.58  CALCIUM 8.9   < > 9.2 9.1 9.4 9.2 9.4  MG 1.6*  --   --   --  1.5* 2.3  --   PHOS 1.7*  --   --   --   --   --   --    < > = values in this interval not displayed.   GFR: Estimated Creatinine Clearance: 68 mL/min (by C-G formula based on SCr of 0.58 mg/dL). Liver Function Tests: Recent Labs  Lab 03/13/20 0214 03/16/20 0348 03/17/20 0345 03/18/20 0645 03/19/20 0825  AST 24 20 29 20 28   ALT 16 16 17 20 20   ALKPHOS 82 70 69 75 77  BILITOT 0.3 0.2* 0.9 0.3 0.7  PROT 5.8* 5.2* 5.1* 5.3* 5.8*  ALBUMIN 2.4* 2.1* 2.1* 2.3* 2.4*   No results for input(s): LIPASE, AMYLASE in the last 168 hours. No results for input(s): AMMONIA in the last 168 hours. Coagulation Profile: No results for input(s): INR, PROTIME in the last 168 hours. Cardiac Enzymes: No results for input(s): CKTOTAL, CKMB, CKMBINDEX, TROPONINI in the last 168 hours. BNP (last 3 results) No results for input(s): PROBNP in the last 8760  hours. HbA1C: No results for input(s): HGBA1C in the last 72 hours. CBG: Recent Labs  Lab 03/18/20 1652 03/18/20 2038 03/19/20 0634 03/19/20 1118 03/19/20 1610  GLUCAP 299* 203* 139* 190* 252*   Lipid Profile: No results for input(s): CHOL, HDL, LDLCALC, TRIG, CHOLHDL, LDLDIRECT in the last 72 hours. Thyroid Function Tests: No results for input(s): TSH, T4TOTAL, FREET4, T3FREE, THYROIDAB in the last 72 hours. Anemia Panel: No results for input(s): VITAMINB12, FOLATE, FERRITIN, TIBC, IRON, RETICCTPCT in the last 72 hours. Sepsis Labs: No results for input(s): PROCALCITON, LATICACIDVEN in the last 168 hours.  No results found for this or any previous visit (from the past 240 hour(s)).   Radiology Studies: No results found.  Scheduled Meds: . collagenase   Topical Daily  . DULoxetine  30 mg Oral Daily  . feeding supplement (ENSURE ENLIVE)  237 mL Oral TID BM  . feeding supplement (PRO-STAT SUGAR FREE 64)  30 mL Oral BID  . furosemide  40 mg Intravenous BID  . gabapentin  100 mg Oral TID  . insulin aspart  0-15 Units Subcutaneous TID WC  . insulin aspart  0-5 Units Subcutaneous QHS  . insulin glargine  20 Units Subcutaneous QHS  . megestrol  200 mg Oral BID  . metoprolol tartrate  12.5 mg Oral BID  . multivitamin with minerals  1 tablet Oral Daily  . nystatin  5 mL Oral QID  . pantoprazole (PROTONIX) IV  40 mg Intravenous Q12H  . sodium chloride flush  10-40 mL Intracatheter Q12H  . sodium chloride  2 g Oral BID WC  . sucralfate  1 g Oral Q6H   Continuous Infusions: . sodium chloride 250 mL/hr at 03/08/20 0957     LOS: 20 days   Yvonne Lund, MD [facility] Pager On Amion  If 7PM-7AM, please contact night-coverage 03/19/2020, 4:18 PM

## 2020-03-19 NOTE — TOC Progression Note (Signed)
Transition of Care [facility]) - Progression Note    Patient Details  Name: Yvonne [name] MRN: [id number] Date of Birth: 1946-05-18  Transition of Care [facility]) CM/SW [place], [place] Phone Number: 03/19/2020, 4:55 PM  Clinical Narrative:    CSW consulted by Palliative that patient's daughter is interested in Hospice at [facility]. CSW verified Isaias Cowman uses [facility] and will initiate services for patient per MD order.   Expected Discharge Plan: [place] Barriers to Discharge: Continued Medical Work up  Expected Discharge Plan and Services Expected Discharge Plan: [place] In-house Referral: Clinical Social Work(TOC referral)     Living arrangements for the past 2 months: Apartment                                       Social Determinants of Health (SDOH) Interventions    Readmission Risk Interventions No flowsheet data found.

## 2020-03-19 NOTE — Progress Notes (Signed)
   03/19/20 2132  Provider Notification  Provider Name/Title Bodenheimer NP  Date Provider Notified 03/19/20  Time Provider Notified 2132  Notification Type Page  Notification Reason Other (Comment) (Pt continues to remove tele - calling to see if can be d/c'd)  Response See new orders  Date of Provider Response 03/19/20   Patient pleasantly confused.  Continues to pull tele monitor leads off.  Stable on Telemetry.  Last note today states that family is interested in Hospice at [facility].  Called to see if we could get the order to discontinue telemetry.  Order received and implemented.  Will continue to monitor patient.  Earleen Reaper RN

## 2020-03-20 LAB — COMPREHENSIVE METABOLIC PANEL
ALT: 21 U/L (ref 0–44)
AST: 29 U/L (ref 15–41)
Albumin: 2.3 g/dL — ABNORMAL LOW (ref 3.5–5.0)
Alkaline Phosphatase: 74 U/L (ref 38–126)
Anion gap: 10 (ref 5–15)
BUN: 23 mg/dL (ref 8–23)
CO2: 23 mmol/L (ref 22–32)
Calcium: 9.5 mg/dL (ref 8.9–10.3)
Chloride: 98 mmol/L (ref 98–111)
Creatinine, Ser: 0.89 mg/dL (ref 0.44–1.00)
GFR calc Af Amer: >60 mL/min (ref >60)
GFR calc non Af Amer: >60 mL/min (ref >60)
Glucose, Bld: 249 mg/dL — ABNORMAL HIGH (ref 70–99)
Potassium: 4.6 mmol/L (ref 3.5–5.1)
Sodium: 131 mmol/L — ABNORMAL LOW (ref 135–145)
Total Bilirubin: 0.5 mg/dL (ref 0.3–1.2)
Total Protein: 5.4 g/dL — ABNORMAL LOW (ref 6.5–8.1)

## 2020-03-20 LAB — GLUCOSE, CAPILLARY
Glucose-Capillary: 235 mg/dL — ABNORMAL HIGH (ref 70–99)
Glucose-Capillary: 221 mg/dL — ABNORMAL HIGH (ref 70–99)
Glucose-Capillary: 204 mg/dL — ABNORMAL HIGH (ref 70–99)
Glucose-Capillary: 134 mg/dL — ABNORMAL HIGH (ref 70–99)

## 2020-03-20 MED ORDER — INSULIN GLARGINE 100 UNIT/ML ~~LOC~~ SOLN
32.0000 [IU] | Freq: Every day | SUBCUTANEOUS | Status: DC
  Administered 2020-03-20 – 2020-03-29 (×9): 32 [IU] via SUBCUTANEOUS
  Filled 2020-03-20 (×10): qty 0.32

## 2020-03-20 MED ORDER — INSULIN ASPART 100 UNIT/ML ~~LOC~~ SOLN
8.0000 [IU] | Freq: Three times a day (TID) | SUBCUTANEOUS | Status: DC
  Administered 2020-03-20 – 2020-03-24 (×10): 8 [IU] via SUBCUTANEOUS

## 2020-03-20 NOTE — Plan of Care (Signed)
  Problem: Education: Goal: Knowledge of General Education information will improve Description Including pain rating scale, medication(s)/side effects and non-pharmacologic comfort measures Outcome: Progressing   

## 2020-03-20 NOTE — Progress Notes (Signed)
PROGRESS NOTE    Yvonne [name]  [id number] DOB: 09-Jul-1946 DOA: 02/27/2020 PCP: Tresa Garter, MD    Brief Narrative:  74 year old female with dementia, DM 2, COPD, CHF, hyperlipidemia presented to hospital on 02/27/20 with altered mental status, high blood sugar, hematemesis.  Patient's daughter-in-law found her at home vomiting some blood.  Patient was seen in the ER and was found to have significant electrolyte imbalance with lactic acidosis, hyponatremia hyperkalemia, DKA, UTI, diabetic foot ulcer/cellulitis.  Patient has not been compliant with her insulin regimen, and family has been out of touch for few months-it appears patient has been noncompliant with medication, and also with complicated and fractured family dynamics.  In ER, CT head was negative for acute finding, chest x-ray unremarkable, MRI of the lower extremities no evidence of cellulitis EKG prolonged QTC. Patient was seen by GI underwent barium swallow that showed delayed passage of contrast at the GE junction and distal esophagus radiology concerned about pseudoachalasia and subsequently underwent  EGD 03/05/2020-found to have extensive esophagitis, gastritis and duodenitis.Biopsies were taken. No mass was seen at the GE junction. No dilatation was performed for the subtle mild stricture at GEJ due to the severe esophagitis. Recommendations are for PPI and sucralfate. The patient has been started on a clear liquid diet. The patient's DIL [name] stated that if the patient does not improve her PO intake, that she would be interested in hospice. DIL [name] has stated that both she and [name] are interested in a feeding tube. The patient no longer wants to talk to any of her family members about it and does not want feeding tube.    Assessment & Plan:   Active Problems:   DKA (diabetic ketoacidoses) ([place])   Dysphagia   Hematemesis with nausea  DKA/AGMA/Metabolic alkalosis:Resolved.Suspect secondary to UTI/diabetic  foot wound/noncompliance. Seems stable presently  Type 2 diabetes mellitus poorly controlled, with insulin noncompliance.  Hemoglobin A1c 10.1.  bedtime (started 4/19), ssi and adjust insulin as oral intake picks up. Glycemic trends remains in the 200's. Increased lantus to 32 units and increased to 8 units meal coverage  Hematemesis/extensive esophagitis, gastritis and duodenitis/nausea vomiting: Nausea vomiting resolved.  Was earlier by GI, underwent EGD, will continue on PPI as tolerated  Anorexia in the setting of uncontrolled diabetes, esophagitis gastritis and dementia-started on Megace, encourage oral intake.  Pt reportedly does not desire to have a feeding tube. Dietitian had been following, recommendation for nutritional supplements Per dietitian, intake shows to be progressing slightly. Continue to encourage PO as tolerated  Foot wound diabetic versus vascular: on Lt Grt toe. healing well. Cont wound care. blood culture no growth. Monitor, cont wound care. ABI reviewed. Mild vascular disease on R, normal on L Remains stable at this time  Suspected UTI, with abnormal UA on admission treated with IV antibiotics cultures no growth.  Had been continued off abx  Hypokalemia/hypomagnesemia/hypophosphatemia- Potassium now replaced. Repeat bmet in AM  History of COPD: Remains on room air and stable.  Chronic pain Pt had been continued on Neurontin  Anxiety/insomnia on low-dose as needed Xanax  Acute on chronic diastolic CHF last ejection normal with findings consistent with diastoilc dysfunction. Weight currently up to 102kg. BLE edema noted on exam. Continues to tolerate 40mg  IV BID lasix with good urine output thus far. Follow bmet in AM  Hyponatremia in the setting of hyperglycemia, remains low, suspect related to volume overload per above. Improving with lasix  Hypocalcemia: Noted to be resolved.   Noncompliance-counselled recently  Morbid obesity with BMI 34: Will  definitely benefit with weight loss and healthy lifestyle but notes at home is feasible in the current setting.  SVT Brief and self limiting noted recently Now on low dose metoprolol 12.5mg  bid with hold parameters Stable at this time  Dementia Unsafe living condition reported by family over phone. Family had noticed gradual decline consistent with worsening dementia. Family is agreeable to short term SNF with plans to have pt move in with son after rehab stay SW following for placement  DVT prophylaxis: SCD's Code Status: DNR Family Communication: pt in room, updated family over phone 4/22  Status is: Inpatient  Remains inpatient appropriate because:Unsafe d/c plan and IV treatments appropriate due to intensity of illness or inability to take PO   Dispo: The patient is from: Home              Anticipated d/c is to: SNF              Anticipated d/c date is: 3 days              Patient currently is not medically stable to d/c.   Consultants:   GI  Palliative Care  Procedures:   EGD  Antimicrobials: Anti-infectives (From admission, onward)   Start     Dose/Rate Route Frequency Ordered Stop   02/28/20 0030  cefTRIAXone (ROCEPHIN) 1 g in sodium chloride 0.9 % 100 mL IVPB  Status:  Discontinued     1 g 200 mL/hr over 30 Minutes Intravenous Every 24 hours 02/28/20 0016 03/03/20 1304      Subjective: Asking if I would like some of her lunch. No other complaints.   Objective: Vitals:   03/19/20 2008 03/20/20 0220 03/20/20 0530 03/20/20 0844  BP: 120/70 (!) 102/42 (!) 111/95 98/87  Pulse: 87 88 79 76  Resp: 18 17 17 20   Temp: 98.8 F (37.1 C) 98.7 F (37.1 C) 98.4 F (36.9 C) 98.7 F (37.1 C)  TempSrc: Oral Oral  Oral  SpO2: 100% 98% 97% 99%  Weight:      Height:        Intake/Output Summary (Last 24 hours) at 03/20/2020 1548 Last data filed at 03/20/2020 1300 Gross per 24 hour  Intake 480 ml  Output 1250 ml  Net -770 ml   Filed Weights   03/16/20 2107  03/17/20 2056 03/18/20 2038  Weight: 100 kg 101 kg 102 kg    Examination: General exam: Awake, laying in bed, in nad, eating Respiratory system: Normal respiratory effort, no wheezing Cardiovascular system: regular rate, s1, s2 Gastrointestinal system: Soft, nondistended, positive BS Central nervous system: CN2-12 grossly intact, strength intact Extremities: Perfused, no clubbing, edema improving Skin: Normal skin turgor, no notable skin lesions seen Psychiatry: Mood normal // no visual hallucinations   Data Reviewed: I have personally reviewed following labs and imaging studies  CBC: No results for input(s): WBC, NEUTROABS, HGB, HCT, MCV, PLT in the last 168 hours. Basic Metabolic Panel: Recent Labs  Lab 03/16/20 0348 03/17/20 0345 03/18/20 0645 03/19/20 0825 03/20/20 0600  NA 129* 130* 129* 130* 131*  K 4.4 5.2* 4.6 5.1 4.6  CL 95* 100 99 100 98  CO2 28 20* 23 19* 23  GLUCOSE 167* 170* 257* 146* 249*  BUN 16 16 17 17 23   CREATININE 0.81 0.70 0.68 0.58 0.89  CALCIUM 9.1 9.4 9.2 9.4 9.5  MG  --  1.5* 2.3  --   --    GFR: Estimated Creatinine  Clearance: 61.1 mL/min (by C-G formula based on SCr of 0.89 mg/dL). Liver Function Tests: Recent Labs  Lab 03/16/20 0348 03/17/20 0345 03/18/20 0645 03/19/20 0825 03/20/20 0600  AST 20 29 20 28 29   ALT 16 17 20 20 21   ALKPHOS 70 69 75 77 74  BILITOT 0.2* 0.9 0.3 0.7 0.5  PROT 5.2* 5.1* 5.3* 5.8* 5.4*  ALBUMIN 2.1* 2.1* 2.3* 2.4* 2.3*   No results for input(s): LIPASE, AMYLASE in the last 168 hours. No results for input(s): AMMONIA in the last 168 hours. Coagulation Profile: No results for input(s): INR, PROTIME in the last 168 hours. Cardiac Enzymes: No results for input(s): CKTOTAL, CKMB, CKMBINDEX, TROPONINI in the last 168 hours. BNP (last 3 results) No results for input(s): PROBNP in the last 8760 hours. HbA1C: No results for input(s): HGBA1C in the last 72 hours. CBG: Recent Labs  Lab 03/19/20 1118  03/19/20 1610 03/19/20 2133 03/20/20 0638 03/20/20 1129  GLUCAP 190* 252* 229* 235* 221*   Lipid Profile: No results for input(s): CHOL, HDL, LDLCALC, TRIG, CHOLHDL, LDLDIRECT in the last 72 hours. Thyroid Function Tests: No results for input(s): TSH, T4TOTAL, FREET4, T3FREE, THYROIDAB in the last 72 hours. Anemia Panel: No results for input(s): VITAMINB12, FOLATE, FERRITIN, TIBC, IRON, RETICCTPCT in the last 72 hours. Sepsis Labs: No results for input(s): PROCALCITON, LATICACIDVEN in the last 168 hours.  No results found for this or any previous visit (from the past 240 hour(s)).   Radiology Studies: No results found.  Scheduled Meds: . collagenase   Topical Daily  . DULoxetine  30 mg Oral Daily  . feeding supplement (ENSURE ENLIVE)  237 mL Oral TID BM  . feeding supplement (PRO-STAT SUGAR FREE 64)  30 mL Oral BID  . furosemide  40 mg Intravenous BID  . gabapentin  100 mg Oral TID  . insulin aspart  0-15 Units Subcutaneous TID WC  . insulin aspart  0-5 Units Subcutaneous QHS  . insulin aspart  5 Units Subcutaneous TID WC  . insulin glargine  28 Units Subcutaneous QHS  . megestrol  200 mg Oral BID  . metoprolol tartrate  12.5 mg Oral BID  . multivitamin with minerals  1 tablet Oral Daily  . nystatin  5 mL Oral QID  . pantoprazole (PROTONIX) IV  40 mg Intravenous Q12H  . sodium chloride flush  10-40 mL Intracatheter Q12H  . sodium chloride  2 g Oral BID WC  . sucralfate  1 g Oral Q6H   Continuous Infusions: . sodium chloride 250 mL/hr at 03/08/20 0957     LOS: 21 days   Marylu Lund, MD [facility] Pager On Amion  If 7PM-7AM, please contact night-coverage 03/20/2020, 3:48 PM

## 2020-03-21 LAB — GLUCOSE, CAPILLARY
Glucose-Capillary: 151 mg/dL — ABNORMAL HIGH (ref 70–99)
Glucose-Capillary: 140 mg/dL — ABNORMAL HIGH (ref 70–99)
Glucose-Capillary: 120 mg/dL — ABNORMAL HIGH (ref 70–99)
Glucose-Capillary: 145 mg/dL — ABNORMAL HIGH (ref 70–99)

## 2020-03-21 LAB — COMPREHENSIVE METABOLIC PANEL
ALT: 22 U/L (ref 0–44)
AST: 39 U/L (ref 15–41)
Albumin: 2.3 g/dL — ABNORMAL LOW (ref 3.5–5.0)
Alkaline Phosphatase: 65 U/L (ref 38–126)
Anion gap: 8 (ref 5–15)
BUN: 28 mg/dL — ABNORMAL HIGH (ref 8–23)
CO2: 24 mmol/L (ref 22–32)
Calcium: 9.4 mg/dL (ref 8.9–10.3)
Chloride: 99 mmol/L (ref 98–111)
Creatinine, Ser: 0.92 mg/dL (ref 0.44–1.00)
GFR calc Af Amer: >60 mL/min (ref >60)
GFR calc non Af Amer: >60 mL/min (ref >60)
Glucose, Bld: 151 mg/dL — ABNORMAL HIGH (ref 70–99)
Potassium: 4.4 mmol/L (ref 3.5–5.1)
Sodium: 131 mmol/L — ABNORMAL LOW (ref 135–145)
Total Bilirubin: 1.2 mg/dL (ref 0.3–1.2)
Total Protein: 5.1 g/dL — ABNORMAL LOW (ref 6.5–8.1)

## 2020-03-21 MED ORDER — ENSURE MAX PROTEIN PO LIQD
11.0000 [oz_av] | Freq: Three times a day (TID) | ORAL | Status: DC
  Administered 2020-03-21 – 2020-03-28 (×17): 11 [oz_av] via ORAL
  Filled 2020-03-21 (×22): qty 330

## 2020-03-21 NOTE — Progress Notes (Signed)
Physical Therapy Treatment Patient Details Name: Yvonne [name] MRN: [id number] DOB: 1945-12-17 Today's Date: 03/21/2020    History of Present Illness Pt is a 74 yo female s/p UTI, diabetic foot ulcer, DKA and cellulitis. EGD on 4/11 revealing esophagitis, gastritis, and duodenitis. PMHx: dementia, T2DM, COPD, CHF,     PT Comments    Patient received in bed, initially reluctant to participate, but overall cooperative and pleasant. Patient performed LE bed exercises with assist. Performed bed mobility with mod +2 assist. She was able to tolerate sitting on side of bed for brief period before lying back down. She will continue to benefit from skilled PT while here to improve functional independence and strength.       Follow Up Recommendations  SNF;Supervision/Assistance - 24 hour     Equipment Recommendations  Other (comment)(TBD)    Recommendations for Other Services       Precautions / Restrictions Precautions Precautions: Fall Restrictions Weight Bearing Restrictions: No    Mobility  Bed Mobility Overal bed mobility: Needs Assistance Bed Mobility: Rolling;Supine to Sit;Sit to Supine Rolling: Mod assist Sidelying to sit: Mod assist;+2 for physical assistance Supine to sit: Mod assist;+2 for physical assistance Sit to supine: +2 for physical assistance;Min assist   General bed mobility comments: patient requires mod +2 assist to raise trunk to seated position. + 2 mod/max for scooting to edge of bed.  Multiple multimodal cues needed for hand placement so that she can assist.  Transfers                 General transfer comment: unable to attempt, upon sitting up on side of bed x 2 patient wants to lie back down quickly.  Ambulation/Gait                 Stairs             Wheelchair Mobility    Modified Rankin (Stroke Patients Only)       Balance Overall balance assessment: Needs assistance Sitting-balance support: Bilateral upper extremity  supported Sitting balance-Leahy Scale: Poor                                      Cognition Arousal/Alertness: Awake/alert Behavior During Therapy: WFL for tasks assessed/performed Overall Cognitive Status: No family/caregiver present to determine baseline cognitive functioning Area of Impairment: Orientation;Attention;Memory;Following commands;Safety/judgement;Awareness;Problem solving                 Orientation Level: Disoriented to;Situation;Place Current Attention Level: Sustained Memory: Decreased short-term memory Following Commands: Follows one step commands inconsistently;Follows one step commands with increased time Safety/Judgement: Decreased awareness of safety;Decreased awareness of deficits Awareness: Emergent Problem Solving: Decreased initiation;Difficulty sequencing;Requires verbal cues;Requires tactile cues General Comments: Patient more cooperative this session, confused.      Exercises Other Exercises Other Exercises: Bed level exercises to inlcude: ap, heel slides, hip abd/add with min assist x 8 reps each bilateral    General Comments        Pertinent Vitals/Pain Pain Assessment: Faces Faces Pain Scale: Hurts little more Pain Location: L shoulder, abdomen Pain Descriptors / Indicators: Discomfort;Sore;Grimacing Pain Intervention(s): Monitored during session;Repositioned    Home Living                      Prior Function            PT Goals (current goals can now be found  in the care plan section) Acute Rehab PT Goals Patient Stated Goal: none stated today Time For Goal Achievement: 04/07/20 Potential to Achieve Goals: Fair Progress towards PT goals: Progressing toward goals    Frequency    Min 2X/week      PT Plan Current plan remains appropriate    Co-evaluation PT/OT/SLP Co-Evaluation/Treatment: Yes Reason for Co-Treatment: For patient/therapist safety;To address functional/ADL transfers PT goals  addressed during session: Mobility/safety with mobility        AM-PAC PT "6 Clicks" Mobility   Outcome Measure  Help needed turning from your back to your side while in a flat bed without using bedrails?: A Lot Help needed moving from lying on your back to sitting on the side of a flat bed without using bedrails?: A Lot Help needed moving to and from a bed to a chair (including a wheelchair)?: A Lot Help needed standing up from a chair using your arms (e.g., wheelchair or bedside chair)?: A Lot Help needed to walk in hospital room?: Total Help needed climbing 3-5 steps with a railing? : Total 6 Click Score: 10    End of Session   Activity Tolerance: Patient limited by fatigue Patient left: in bed;with bed alarm set;with call bell/phone within reach Nurse Communication: Mobility status PT Visit Diagnosis: Other abnormalities of gait and mobility (R26.89);Muscle weakness (generalized) (M62.81);Pain Pain - Right/Left: Left Pain - part of body: Shoulder     Time: [id number] PT Time Calculation (min) (ACUTE ONLY): 25 min  Charges:  $Therapeutic Exercise: 8-22 mins                     [employer], PT, GCS 03/21/20,11:06 AM

## 2020-03-21 NOTE — Progress Notes (Signed)
Occupational Therapy Treatment Patient Details Name: Yvonne [name] MRN: [id number] DOB: 04-01-1946 Today's Date: 03/21/2020    History of present illness Pt is a 74 yo female s/p UTI, diabetic foot ulcer, DKA and cellulitis. EGD on 4/11 revealing esophagitis, gastritis, and duodenitis. PMHx: dementia, T2DM, COPD, CHF,    OT comments  Pt progressing with OT goals gradually. With encouragement, pt agreeable to work with therapy. Pt Mod A + 2 for bed mobility to sit EOB, but did not tolerate prolonged sitting and requesting return to bed. Pt Min A to return to supine. Guided pt in bed level ADLs due to pt declined to attempt sitting EOB again, overall setup with these tasks. Pt indicated that she may be interested in OOB activities during next session. Continue to recommend SNF for short term rehab based on current functional abilities.    Follow Up Recommendations  SNF;Supervision/Assistance - 24 hour    Equipment Recommendations  Other (comment)(TBD)    Recommendations for Other Services      Precautions / Restrictions Precautions Precautions: Fall Precaution Comments: Pt resistant to most all activity Restrictions Weight Bearing Restrictions: No       Mobility Bed Mobility Overal bed mobility: Needs Assistance Bed Mobility: Rolling;Supine to Sit;Sit to Supine Rolling: Mod assist Sidelying to sit: Mod assist;+2 for physical assistance Supine to sit: Mod assist;+2 for physical assistance Sit to supine: Min assist;+2 for physical assistance   General bed mobility comments: patient requires mod +2 assist to raise trunk to seated position. + 2 mod/max for scooting to edge of bed.  Multiple multimodal cues needed for hand placement so that she can assist.  Transfers                 General transfer comment: unable to attempt, upon sitting up on side of bed x 2 patient wants to lie back down quickly.    Balance Overall balance assessment: Needs  assistance Sitting-balance support: Bilateral upper extremity supported Sitting balance-Leahy Scale: Poor                                     ADL either performed or assessed with clinical judgement   ADL Overall ADL's : Needs assistance/impaired     Grooming: Set up;Supervision/safety;Wash/dry hands;Wash/dry face;Brushing hair;Bed level Grooming Details (indicate cue type and reason): Setup assist for grooming tasks bed level                               General ADL Comments: Pt sat EOB for < 30 seconds before laying down, completed ADLs bed level due to pt resistant for bed mobility      Vision       Perception     Praxis      Cognition Arousal/Alertness: Awake/alert Behavior During Therapy: WFL for tasks assessed/performed Overall Cognitive Status: No family/caregiver present to determine baseline cognitive functioning Area of Impairment: Orientation;Attention;Memory;Following commands;Safety/judgement;Awareness;Problem solving                 Orientation Level: Disoriented to;Situation;Place Current Attention Level: Sustained Memory: Decreased short-term memory Following Commands: Follows one step commands inconsistently;Follows one step commands with increased time Safety/Judgement: Decreased awareness of safety;Decreased awareness of deficits Awareness: Emergent Problem Solving: Decreased initiation;Difficulty sequencing;Requires verbal cues;Requires tactile cues General Comments: Patient more cooperative this session, confused.        Exercises Other  Exercises Other Exercises: Bed level exercises to inlcude: ap, heel slides, hip abd/add with min assist x 8 reps each bilateral   Shoulder Instructions       General Comments      Pertinent Vitals/ Pain       Pain Assessment: Faces Faces Pain Scale: Hurts little more Pain Location: L shoulder, abdomen Pain Descriptors / Indicators: Discomfort;Sore;Grimacing Pain  Intervention(s): Limited activity within patient's tolerance;Monitored during session;Repositioned  Home Living                                          Prior Functioning/Environment              Frequency  Min 2X/week        Progress Toward Goals  OT Goals(current goals can now be found in the care plan section)  Progress towards OT goals: Progressing toward goals  Acute Rehab OT Goals Patient Stated Goal: none stated today OT Goal Formulation: With patient Time For Goal Achievement: 03/22/20 Potential to Achieve Goals: Good ADL Goals Pt Will Perform Grooming: with min guard assist;standing Pt Will Perform Lower Body Dressing: with min assist;sitting/lateral leans;sit to/from stand Pt Will Transfer to Toilet: with mod assist;stand pivot transfer;bedside commode Additional ADL Goal #1: Pt will demonstrate selective attention duirng ADL task in minimally distracting environment with minimal cues. Additional ADL Goal #2: Pt will participate in 10 mins of ADL tasks with minA overall with 2 rest breaks.  Plan Discharge plan remains appropriate    Co-evaluation    PT/OT/SLP Co-Evaluation/Treatment: Yes Reason for Co-Treatment: Complexity of the patient's impairments (multi-system involvement);To address functional/ADL transfers PT goals addressed during session: Mobility/safety with mobility OT goals addressed during session: ADL's and self-care      AM-PAC OT "6 Clicks" Daily Activity     Outcome Measure   Help from another person eating meals?: A Little Help from another person taking care of personal grooming?: A Little Help from another person toileting, which includes using toliet, bedpan, or urinal?: Total Help from another person bathing (including washing, rinsing, drying)?: A Lot Help from another person to put on and taking off regular upper body clothing?: A Lot Help from another person to put on and taking off regular lower body  clothing?: A Lot 6 Click Score: 13    End of Session    OT Visit Diagnosis: Unsteadiness on feet (R26.81);Pain;Other symptoms and signs involving cognitive function Pain - Right/Left: Left Pain - part of body: Shoulder   Activity Tolerance Patient tolerated treatment well;Patient limited by fatigue   Patient Left in bed;with call bell/phone within reach;with bed alarm set   Nurse Communication Mobility status        Time: [id number] OT Time Calculation (min): 25 min  Charges: OT General Charges $OT Visit: 1 Visit OT Treatments $Self Care/Home Management : 8-22 mins  Layla Maw, OTR/L   Layla Maw 03/21/2020, 1:54 PM

## 2020-03-21 NOTE — Plan of Care (Signed)
  Problem: Safety: Goal: Ability to remain free from injury will improve Outcome: Progressing   

## 2020-03-21 NOTE — TOC Progression Note (Addendum)
Transition of Care [facility]) - Progression Note    Patient Details  Name: NOHEA GRAF MRN: [id number] Date of Birth: 05-19-1946  [facility]) CM/SW Contact  Sharlet Salina Mila Homer, LCSW Phone Number: 03/21/2020, 1:33 PM  Clinical Narrative:  Call received from Adult Protective Services Social Worker Towanda Malkin [phone]) regarding patient and update provided. Ms. London Pepper indicated that her she can be reached at [phone] (cell), however a VM can't be left on this phone.   Call made to [name] Wadhwa,daughter-in-law [phone]) regarding placemen for patient. She was advised that [facility] has no bed availability and admissions director did not know when a bed would be available. Ms. Sotello was informed that patient is near readiness for discharge and was provided with all bed offers. Mrs. Mcaleese indicated that she would contact her husband and share the information and if he did not want any of the facilities they will take patient home, and [facility] and DME services discussed.  [name] indicated that they would need a wheelchair, hospital bed, bench bath,  3-in-1 and walker and she was advised what DME patient's insurance will pay for.  Daughter-in-law also expressed concern regarding a couple of raw places on the patient: between her legs near her groin area and under her breast and also a bedsore on her tailbone, and what type treatment would help these areas. Mrs. Zody was informed that this information would be shared with Md (Secure chat sent to MD after call with daughter-in-law regarding these concerns). Mrs. Witkowski was reminded of patient's near readiness for discharge and asked to call CSW with their SNF decision (first and second choices requested).   **PASRR number pending. Requested clinicals faxed to [facility] MUST 4/23.      Expected Discharge Plan: Skilled Nursing Facility Barriers to Discharge: Continued Medical Work up  Expected Discharge Plan and  Services Expected Discharge Plan: [place] In-house Referral: Clinical Social Work(TOC referral)     Living arrangements for the past 2 months: Apartment                                       Social Determinants of Health (SDOH) Interventions  No SDOH interventions requested or needed at this time  Readmission Risk Interventions No flowsheet data found.

## 2020-03-21 NOTE — Progress Notes (Signed)
PROGRESS NOTE    Yvonne [name]  [id number] DOB: 1945/12/13 DOA: 02/27/2020 PCP: Tresa Garter, MD    Brief Narrative:  74 year old female with dementia, DM 2, COPD, CHF, hyperlipidemia presented to hospital on 02/27/20 with altered mental status, high blood sugar, hematemesis.  Patient's daughter-in-law found her at home vomiting some blood.  Patient was seen in the ER and was found to have significant electrolyte imbalance with lactic acidosis, hyponatremia hyperkalemia, DKA, UTI, diabetic foot ulcer/cellulitis.  Patient has not been compliant with her insulin regimen, and family has been out of touch for few months-it appears patient has been noncompliant with medication, and also with complicated and fractured family dynamics.  In ER, CT head was negative for acute finding, chest x-ray unremarkable, MRI of the lower extremities no evidence of cellulitis EKG prolonged QTC. Patient was seen by GI underwent barium swallow that showed delayed passage of contrast at the GE junction and distal esophagus radiology concerned about pseudoachalasia and subsequently underwent  EGD 03/05/2020-found to have extensive esophagitis, gastritis and duodenitis.Biopsies were taken. No mass was seen at the GE junction. No dilatation was performed for the subtle mild stricture at GEJ due to the severe esophagitis. Recommendations are for PPI and sucralfate. The patient has been started on a clear liquid diet. The patient's DIL [name] stated that if the patient does not improve her PO intake, that she would be interested in hospice. DIL [name] has stated that both she and [name] are interested in a feeding tube. The patient no longer wants to talk to any of her family members about it and does not want feeding tube.    Assessment & Plan:   Active Problems:   DKA (diabetic ketoacidoses) ([place])   Dysphagia   Hematemesis with nausea  DKA/AGMA/Metabolic alkalosis:Resolved.Suspect secondary to UTI/diabetic  foot wound/noncompliance. Seems stable currently  Type 2 diabetes mellitus poorly controlled, with insulin noncompliance.  Hemoglobin A1c 10.1.  bedtime (started 4/19), ssi and adjust insulin as oral intake picks up. Glycemic trends remains in the 200's. Currently on lantus to 32 units and 8 units meal coverage. Glycemic trends seem improved  Hematemesis/extensive esophagitis, gastritis and duodenitis/nausea vomiting: Was earlier by GI, underwent EGD, will continue on PPI as tolerated. Nausea vomiting resolved.   Anorexia in the setting of uncontrolled diabetes, esophagitis gastritis and dementia-started on Megace, encourage oral intake.  Pt reportedly does not desire to have a feeding tube. Dietitian had been following, recommendation for nutritional supplements Per dietitian, intake shows improvement. This AM, pt eating breakfast well  Foot wound diabetic versus vascular: on Lt Grt toe. healing well. Cont wound care. blood culture no growth. Monitor, cont wound care. ABI reviewed. Mild vascular disease on R, normal on L Remains stable currently  Suspected UTI, with abnormal UA on admission treated with IV antibiotics cultures no growth.  Had been continued off abx and stable  Hypokalemia/hypomagnesemia/hypophosphatemia- Potassium now replaced. Repeat bmet in AM  History of COPD: Remains on room air and stable.  Chronic pain Pt had been continued on Neurontin  Anxiety/insomnia on low-dose as needed Xanax  Acute on chronic diastolic CHF last ejection normal with findings consistent with diastoilc dysfunction.  BLE edema noted on exam. Continues to tolerate 40mg  IV BID lasix, remains with good urine output. Wt up to 111kg from 102kg at last check. Repeat bmet in AM  Hyponatremia in the setting of hyperglycemia, remains low, suspect related to volume overload per above. Improving with lasix  Hypocalcemia: Noted to be resolved.  Noncompliance-counselled recently  Morbid  obesity with BMI 34: Will definitely benefit with weight loss and healthy lifestyle but notes at home is feasible in the current setting.  SVT Brief and self limiting noted recently Currently on low dose metoprolol 12.5mg  bid with hold parameters HR stable at this time  Dementia Unsafe living condition reported by family over phone. Family had noticed gradual decline consistent with worsening dementia. Family is agreeable to short term SNF with plans to have pt move in with son after rehab stay SW following for placement  DVT prophylaxis: SCD's Code Status: DNR Family Communication: pt in room, had earlier updated family over phone  Status is: Inpatient  Remains inpatient appropriate because:Unsafe d/c plan and IV treatments appropriate due to intensity of illness or inability to take PO   Dispo: The patient is from: Home              Anticipated d/c is to: SNF              Anticipated d/c date is: 3 days              Patient currently is not medically stable to d/c.   Consultants:   GI  Palliative Care  Procedures:   EGD  Antimicrobials: Anti-infectives (From admission, onward)   Start     Dose/Rate Route Frequency Ordered Stop   02/28/20 0030  cefTRIAXone (ROCEPHIN) 1 g in sodium chloride 0.9 % 100 mL IVPB  Status:  Discontinued     1 g 200 mL/hr over 30 Minutes Intravenous Every 24 hours 02/28/20 0016 03/03/20 1304      Subjective: Without complaints this AM. States she feels tired this AM   Objective: Vitals:   03/21/20 0910 03/21/20 0921 03/21/20 0947 03/21/20 1331  BP:  124/70 116/71 117/64  Pulse:  82 82 71  Resp:  18 20 18   Temp:  97.7 F (36.5 C) 98.1 F (36.7 C) 98.2 F (36.8 C)  TempSrc:  Oral Oral Oral  SpO2:  100% 99% 99%  Weight: 111 kg     Height:        Intake/Output Summary (Last 24 hours) at 03/21/2020 1508 Last data filed at 03/21/2020 0900 Gross per 24 hour  Intake 540 ml  Output 1200 ml  Net -660 ml   Filed Weights   03/17/20  2056 03/18/20 2038 03/21/20 0910  Weight: 101 kg 102 kg 111 kg    Examination: General exam: Conversant, in no acute distress Respiratory system: normal chest rise, clear, no audible wheezing Cardiovascular system: regular rhythm, s1-s2 Gastrointestinal system: Nondistended, nontender, pos BS Central nervous system: No seizures, no tremors Extremities: No cyanosis, no joint deformities, BLE edema improving Skin: No rashes, no pallor Psychiatry: Affect normal // no auditory hallucinations   Data Reviewed: I have personally reviewed following labs and imaging studies  CBC: No results for input(s): WBC, NEUTROABS, HGB, HCT, MCV, PLT in the last 168 hours. Basic Metabolic Panel: Recent Labs  Lab 03/17/20 0345 03/18/20 0645 03/19/20 0825 03/20/20 0600 03/21/20 0659  NA 130* 129* 130* 131* 131*  K 5.2* 4.6 5.1 4.6 4.4  CL 100 99 100 98 99  CO2 20* 23 19* 23 24  GLUCOSE 170* 257* 146* 249* 151*  BUN 16 17 17 23  28*  CREATININE 0.70 0.68 0.58 0.89 0.92  CALCIUM 9.4 9.2 9.4 9.5 9.4  MG 1.5* 2.3  --   --   --    GFR: Estimated Creatinine Clearance: 62.1 mL/min (by  C-G formula based on SCr of 0.92 mg/dL). Liver Function Tests: Recent Labs  Lab 03/17/20 0345 03/18/20 0645 03/19/20 0825 03/20/20 0600 03/21/20 0659  AST 29 20 28 29  39  ALT 17 20 20 21 22   ALKPHOS 69 75 77 74 65  BILITOT 0.9 0.3 0.7 0.5 1.2  PROT 5.1* 5.3* 5.8* 5.4* 5.1*  ALBUMIN 2.1* 2.3* 2.4* 2.3* 2.3*   No results for input(s): LIPASE, AMYLASE in the last 168 hours. No results for input(s): AMMONIA in the last 168 hours. Coagulation Profile: No results for input(s): INR, PROTIME in the last 168 hours. Cardiac Enzymes: No results for input(s): CKTOTAL, CKMB, CKMBINDEX, TROPONINI in the last 168 hours. BNP (last 3 results) No results for input(s): PROBNP in the last 8760 hours. HbA1C: No results for input(s): HGBA1C in the last 72 hours. CBG: Recent Labs  Lab 03/20/20 1129 03/20/20 1639  03/20/20 2111 03/21/20 0633 03/21/20 1122  GLUCAP 221* 204* 134* 151* 140*   Lipid Profile: No results for input(s): CHOL, HDL, LDLCALC, TRIG, CHOLHDL, LDLDIRECT in the last 72 hours. Thyroid Function Tests: No results for input(s): TSH, T4TOTAL, FREET4, T3FREE, THYROIDAB in the last 72 hours. Anemia Panel: No results for input(s): VITAMINB12, FOLATE, FERRITIN, TIBC, IRON, RETICCTPCT in the last 72 hours. Sepsis Labs: No results for input(s): PROCALCITON, LATICACIDVEN in the last 168 hours.  No results found for this or any previous visit (from the past 240 hour(s)).   Radiology Studies: No results found.  Scheduled Meds: . collagenase   Topical Daily  . DULoxetine  30 mg Oral Daily  . feeding supplement (ENSURE ENLIVE)  237 mL Oral TID BM  . feeding supplement (PRO-STAT SUGAR FREE 64)  30 mL Oral BID  . furosemide  40 mg Intravenous BID  . gabapentin  100 mg Oral TID  . insulin aspart  0-15 Units Subcutaneous TID WC  . insulin aspart  0-5 Units Subcutaneous QHS  . insulin aspart  8 Units Subcutaneous TID WC  . insulin glargine  32 Units Subcutaneous QHS  . megestrol  200 mg Oral BID  . metoprolol tartrate  12.5 mg Oral BID  . multivitamin with minerals  1 tablet Oral Daily  . nystatin  5 mL Oral QID  . pantoprazole (PROTONIX) IV  40 mg Intravenous Q12H  . sodium chloride flush  10-40 mL Intracatheter Q12H  . sodium chloride  2 g Oral BID WC  . sucralfate  1 g Oral Q6H   Continuous Infusions: . sodium chloride 250 mL/hr at 03/08/20 0957     LOS: 22 days   Marylu Lund, MD [facility] Pager On Amion  If 7PM-7AM, please contact night-coverage 03/21/2020, 3:08 PM

## 2020-03-21 NOTE — Progress Notes (Addendum)
Nutrition Follow-up  DOCUMENTATION CODES:   Not applicable  INTERVENTION:  D/c Ensure Enlive po TID, each supplement provides 350 kcal and 20 grams of protein  Ensure Max po TID, each supplement provides 150 kcal and 30 grams of protein  Continue 62ml Prostat BID, each supplement provides 100 kcals and 15 grams protein  Continue MVI daily   NUTRITION DIAGNOSIS:   Inadequate oral intake related to poor appetite as evidenced by meal completion < 25%.  Progressing  GOAL:   Patient will meet greater than or equal to 90% of their needs  Progressing  MONITOR:   PO intake, Supplement acceptance, Weight trends, Diet advancement, Labs, I & O's, Skin  REASON FOR ASSESSMENT:   Consult Assessment of nutrition requirement/status  ASSESSMENT:   Patient with PMH significant for dementia, DM, COPD, CHF, HLD, and chronic pain. Presents this admission with DKA.  4/11- s/p EGD- revealed esophagitis, gastritis, and duodenitis   Pt sleeping at time of RD visit and did not wake to RD voice. Per previous RD notes and MD notes, pt has declined use of feeding tube. Per RN, pt is drinking Ensure and Pro-stat well.  PO Intake: 0-100% x last 8 recorded meals (56% average meal intake)  Admit wt: 84 kg Current wt: 111 kg Pt noted to have generalized edema.   UOP: 1,773ml x24 hours I/O: +36,321.69ml since admit  Labs: Na 131 (L), CBGs 151-140-120 Medications reviewed and include: Ensure Enlive TID, 69ml Prostat BID, Lasix, Novolog, Lantus, Megace, MVI, Carafate  Diet Order:   Diet Order            DIET DYS 3 Room service appropriate? Yes with Assist; Fluid consistency: Thin  Diet effective now              EDUCATION NEEDS:   Not appropriate for education at this time  Skin:  Skin Assessment: Skin Integrity Issues: Skin Integrity Issues:: Other (Comment) Other: wound L foot  Last BM:  4/24 type 7  Height:   Ht Readings from Last 1 Encounters:  02/28/20 5\' 2"  (1.575 m)     Weight:   Wt Readings from Last 1 Encounters:  03/21/20 111 kg    BMI:  Body mass index is 44.76 kg/m.  Estimated Nutritional Needs:   Kcal:  1700-1900 kcal  Protein:  85-100 grams  Fluid:  >/= 1.7 L/day   Larkin Ina, MS, RD, LDN RD pager number and weekend/on-call pager number located in [place].

## 2020-03-22 LAB — COMPREHENSIVE METABOLIC PANEL
ALT: 19 U/L (ref 0–44)
AST: 19 U/L (ref 15–41)
Albumin: 2.6 g/dL — ABNORMAL LOW (ref 3.5–5.0)
Alkaline Phosphatase: 74 U/L (ref 38–126)
Anion gap: 17 — ABNORMAL HIGH (ref 5–15)
BUN: 32 mg/dL — ABNORMAL HIGH (ref 8–23)
CO2: 16 mmol/L — ABNORMAL LOW (ref 22–32)
Calcium: 9.9 mg/dL (ref 8.9–10.3)
Chloride: 97 mmol/L — ABNORMAL LOW (ref 98–111)
Creatinine, Ser: 0.8 mg/dL (ref 0.44–1.00)
GFR calc Af Amer: >60 mL/min (ref >60)
GFR calc non Af Amer: >60 mL/min (ref >60)
Glucose, Bld: 156 mg/dL — ABNORMAL HIGH (ref 70–99)
Potassium: 3.1 mmol/L — ABNORMAL LOW (ref 3.5–5.1)
Sodium: 130 mmol/L — ABNORMAL LOW (ref 135–145)
Total Bilirubin: 0.3 mg/dL (ref 0.3–1.2)
Total Protein: 5.9 g/dL — ABNORMAL LOW (ref 6.5–8.1)

## 2020-03-22 LAB — GLUCOSE, CAPILLARY
Glucose-Capillary: 160 mg/dL — ABNORMAL HIGH (ref 70–99)
Glucose-Capillary: 141 mg/dL — ABNORMAL HIGH (ref 70–99)
Glucose-Capillary: 140 mg/dL — ABNORMAL HIGH (ref 70–99)
Glucose-Capillary: 140 mg/dL — ABNORMAL HIGH (ref 70–99)

## 2020-03-22 MED ORDER — PANTOPRAZOLE SODIUM 40 MG PO TBEC
40.0000 mg | DELAYED_RELEASE_TABLET | Freq: Two times a day (BID) | ORAL | Status: DC
  Administered 2020-03-22 – 2020-03-29 (×16): 40 mg via ORAL
  Filled 2020-03-22 (×16): qty 1

## 2020-03-22 MED ORDER — FUROSEMIDE 40 MG PO TABS
40.0000 mg | ORAL_TABLET | Freq: Two times a day (BID) | ORAL | Status: DC
  Administered 2020-03-22 – 2020-03-29 (×15): 40 mg via ORAL
  Filled 2020-03-22 (×15): qty 1

## 2020-03-22 MED ORDER — POTASSIUM CHLORIDE CRYS ER 20 MEQ PO TBCR
40.0000 meq | EXTENDED_RELEASE_TABLET | Freq: Four times a day (QID) | ORAL | Status: AC
  Administered 2020-03-22 (×2): 40 meq via ORAL
  Filled 2020-03-22 (×2): qty 2

## 2020-03-22 NOTE — Progress Notes (Signed)
PROGRESS NOTE    Yvonne [name]  [id number] DOB: Feb 01, 1946 DOA: 02/27/2020 PCP: Tresa Garter, MD    Brief Narrative:  74 year old female with dementia, DM 2, COPD, CHF, hyperlipidemia presented to hospital on 02/27/20 with altered mental status, high blood sugar, hematemesis.  Patient's daughter-in-law found her at home vomiting some blood.  Patient was seen in the ER and was found to have significant electrolyte imbalance with lactic acidosis, hyponatremia hyperkalemia, DKA, UTI, diabetic foot ulcer/cellulitis.  Patient has not been compliant with her insulin regimen, and family has been out of touch for few months-it appears patient has been noncompliant with medication, and also with complicated and fractured family dynamics.  In ER, CT head was negative for acute finding, chest x-ray unremarkable, MRI of the lower extremities no evidence of cellulitis EKG prolonged QTC. Patient was seen by GI underwent barium swallow that showed delayed passage of contrast at the GE junction and distal esophagus radiology concerned about pseudoachalasia and subsequently underwent  EGD 03/05/2020-found to have extensive esophagitis, gastritis and duodenitis.Biopsies were taken. No mass was seen at the GE junction. No dilatation was performed for the subtle mild stricture at GEJ due to the severe esophagitis. Recommendations are for PPI and sucralfate. The patient has been started on a clear liquid diet. The patient's DIL [name] stated that if the patient does not improve her PO intake, that she would be interested in hospice. DIL [name] has stated that both she and [name] are interested in a feeding tube. The patient no longer wants to talk to any of her family members about it and does not want feeding tube.    Assessment & Plan:   Active Problems:   DKA (diabetic ketoacidoses) ([place])   Dysphagia   Hematemesis with nausea  DKA/AGMA/Metabolic alkalosis:Resolved.Suspect secondary to UTI/diabetic  foot wound/noncompliance. Seems stable currently  Type 2 diabetes mellitus poorly controlled, with insulin noncompliance.  Hemoglobin A1c 10.1. Currently on lantus to 32 units and 8 units meal coverage.  Continue current management as blood sugar remains controlled.  Hematemesis/extensive esophagitis, gastritis and duodenitis/nausea vomiting: Was earlier by GI, underwent EGD, will continue on PPI as tolerated. Nausea vomiting resolved.   Anorexia in the setting of uncontrolled diabetes, esophagitis gastritis and dementia-started on Megace, encourage oral intake.  Pt reportedly does not desire to have a feeding tube. Dietitian had been following, recommendation for nutritional supplements  Foot wound diabetic versus vascular: on Lt Grt toe. healing well. Cont wound care. blood culture no growth. Monitor, cont wound care. ABI reviewed. Mild vascular disease on R, normal on L Remains stable currently  Suspected UTI, with abnormal UA on admission treated with IV antibiotics cultures no growth.  Had been continued off abx and stable  Hypokalemia: 3.1 today.  Will replace orally.  Recheck in the morning.  Recheck magnesium in the morning as well.  History of COPD: Remains on room air and stable.  Chronic pain Pt had been continued on Neurontin  Anxiety/insomnia on low-dose as needed Xanax  Acute on chronic diastolic CHF last ejection normal with findings consistent with diastoilc dysfunction.  BLE edema noted on exam but improving. Continues to tolerate 40mg  IV BID lasix, remains with good urine output. Wt up to 111kg on 03/21/2020 from 102kg at last check.  Doubt accuracy as patient does not seem to be overloaded.  Will scale back to oral Lasix at 40 mg p.o. twice daily.  Hyponatremia in the setting of hyperglycemia, remains low, suspect related to volume overload per  above. Improving with lasix  Hypocalcemia:  resolved.   Noncompliance-counselled recently  Morbid obesity with  BMI 34: Will definitely benefit with weight loss and healthy lifestyle but notes at home is feasible in the current setting.  SVT Brief and self limiting noted recently Currently on low dose metoprolol 12.5mg  bid with hold parameters HR stable at this time  Dementia Unsafe living condition reported by family over phone. Family had noticed gradual decline consistent with worsening dementia. Family is agreeable to short term SNF with plans to have pt move in with son after rehab stay SW following for placement   DVT prophylaxis: SCD's Code Status: DNR Family Communication: No family present at the bedside.  Status is: Inpatient  Remains inpatient appropriate because:Unsafe d/c plan and IV treatments appropriate due to intensity of illness or inability to take PO   Dispo: The patient is from: Home              Anticipated d/c is to: SNF              Anticipated d/c date is: 3 days              Patient currently is medically ready for discharge   Consultants:   GI  Palliative Care  Procedures:   EGD  Antimicrobials: Anti-infectives (From admission, onward)   Start     Dose/Rate Route Frequency Ordered Stop   02/28/20 0030  cefTRIAXone (ROCEPHIN) 1 g in sodium chloride 0.9 % 100 mL IVPB  Status:  Discontinued     1 g 200 mL/hr over 30 Minutes Intravenous Every 24 hours 02/28/20 0016 03/03/20 1304      Subjective: Patient seen and examined.  She feels much better.  She is very pleasant.  Denied any complaint.  Objective: Vitals:   03/21/20 1842 03/21/20 2234 03/22/20 0501 03/22/20 0825  BP: (!) 147/66 (!) 123/59 112/81 (!) 131/97  Pulse: 91 83 89 93  Resp: 18 18 18 18   Temp: 98.7 F (37.1 C) 98.5 F (36.9 C) 99.6 F (37.6 C) 98.5 F (36.9 C)  TempSrc: Oral Oral  Oral  SpO2: 96% 98% 93% 98%  Weight:      Height:        Intake/Output Summary (Last 24 hours) at 03/22/2020 1226 Last data filed at 03/22/2020 0900 Gross per 24 hour  Intake 560 ml  Output 600  ml  Net -40 ml   Filed Weights   03/17/20 2056 03/18/20 2038 03/21/20 0910  Weight: 101 kg 102 kg 111 kg    Examination:  General exam: Appears calm and comfortable  Respiratory system: Clear to auscultation. Respiratory effort normal. Cardiovascular system: S1 & S2 heard, RRR. No JVD, murmurs, rubs, gallops or clicks.  +1 pitting edema bilateral lower extremity Gastrointestinal system: Abdomen is nondistended, soft and nontender. No organomegaly or masses felt. Normal bowel sounds heard. Central nervous system: Alert and oriented. No focal neurological deficits. Extremities: Symmetric 5 x 5 power. Skin: No rashes, lesions or ulcers.  Psychiatry: Judgement and insight appear normal. Mood & affect appropriate.   Data Reviewed: I have personally reviewed following labs and imaging studies  CBC: No results for input(s): WBC, NEUTROABS, HGB, HCT, MCV, PLT in the last 168 hours. Basic Metabolic Panel: Recent Labs  Lab 03/17/20 0345 03/17/20 0345 03/18/20 0645 03/19/20 0825 03/20/20 0600 03/21/20 0659 03/22/20 0414  NA 130*   < > 129* 130* 131* 131* 130*  K 5.2*   < > 4.6 5.1 4.6 4.4  3.1*  CL 100   < > 99 100 98 99 97*  CO2 20*   < > 23 19* 23 24 16*  GLUCOSE 170*   < > 257* 146* 249* 151* 156*  BUN 16   < > 17 17 23  28* 32*  CREATININE 0.70   < > 0.68 0.58 0.89 0.92 0.80  CALCIUM 9.4   < > 9.2 9.4 9.5 9.4 9.9  MG 1.5*  --  2.3  --   --   --   --    < > = values in this interval not displayed.   GFR: Estimated Creatinine Clearance: 71.5 mL/min (by C-G formula based on SCr of 0.8 mg/dL). Liver Function Tests: Recent Labs  Lab 03/18/20 0645 03/19/20 0825 03/20/20 0600 03/21/20 0659 03/22/20 0414  AST 20 28 29  39 19  ALT 20 20 21 22 19   ALKPHOS 75 77 74 65 74  BILITOT 0.3 0.7 0.5 1.2 0.3  PROT 5.3* 5.8* 5.4* 5.1* 5.9*  ALBUMIN 2.3* 2.4* 2.3* 2.3* 2.6*   No results for input(s): LIPASE, AMYLASE in the last 168 hours. No results for input(s): AMMONIA in the last 168  hours. Coagulation Profile: No results for input(s): INR, PROTIME in the last 168 hours. Cardiac Enzymes: No results for input(s): CKTOTAL, CKMB, CKMBINDEX, TROPONINI in the last 168 hours. BNP (last 3 results) No results for input(s): PROBNP in the last 8760 hours. HbA1C: No results for input(s): HGBA1C in the last 72 hours. CBG: Recent Labs  Lab 03/21/20 1122 03/21/20 1650 03/21/20 2223 03/22/20 0733 03/22/20 1130  GLUCAP 140* 120* 145* 160* 141*   Lipid Profile: No results for input(s): CHOL, HDL, LDLCALC, TRIG, CHOLHDL, LDLDIRECT in the last 72 hours. Thyroid Function Tests: No results for input(s): TSH, T4TOTAL, FREET4, T3FREE, THYROIDAB in the last 72 hours. Anemia Panel: No results for input(s): VITAMINB12, FOLATE, FERRITIN, TIBC, IRON, RETICCTPCT in the last 72 hours. Sepsis Labs: No results for input(s): PROCALCITON, LATICACIDVEN in the last 168 hours.  No results found for this or any previous visit (from the past 240 hour(s)).   Radiology Studies: No results found.  Scheduled Meds: . collagenase   Topical Daily  . DULoxetine  30 mg Oral Daily  . feeding supplement (PRO-STAT SUGAR FREE 64)  30 mL Oral BID  . furosemide  40 mg Intravenous BID  . gabapentin  100 mg Oral TID  . insulin aspart  0-15 Units Subcutaneous TID WC  . insulin aspart  0-5 Units Subcutaneous QHS  . insulin aspart  8 Units Subcutaneous TID WC  . insulin glargine  32 Units Subcutaneous QHS  . megestrol  200 mg Oral BID  . metoprolol tartrate  12.5 mg Oral BID  . multivitamin with minerals  1 tablet Oral Daily  . nystatin  5 mL Oral QID  . pantoprazole  40 mg Oral Q12H  . potassium chloride  40 mEq Oral Q6H  . Ensure Max Protein  11 oz Oral TID  . sodium chloride flush  10-40 mL Intracatheter Q12H  . sodium chloride  2 g Oral BID WC  . sucralfate  1 g Oral Q6H   Continuous Infusions: . sodium chloride 250 mL/hr at 03/08/20 0957     LOS: 23 days   Total time spent 35 minutes Darliss Cheney, MD [facility] Pager On Amion  If 7PM-7AM, please contact night-coverage 03/22/2020, 12:26 PM

## 2020-03-22 NOTE — Plan of Care (Signed)
  Problem: Education: Goal: Knowledge of General Education information will improve Description: Including pain rating scale, medication(s)/side effects and non-pharmacologic comfort measures Outcome: Progressing   Problem: Nutrition: Goal: Adequate nutrition will be maintained Outcome: Progressing   

## 2020-03-22 NOTE — TOC Progression Note (Signed)
Transition of Care [facility]) - Progression Note    Patient Details  Name: Yvonne [name] MRN: [id number] Date of Birth: 1946-07-24  Transition of [facility]) CM/SW [place], [place] Phone Number: 03/22/2020, 4:32 PM  Clinical Narrative:     CSW spoke to [facility] via phone. [name] stated that her and her husband have chosen [place]. [name] stated that she is fine with her being there for short term rehab. [name] asked when she will need to sign her in and her readiness to discharge. CSW informed her that pt will not need to be signed in until there is a discharge date and the doctor has not yet given a date. CSW informed [name] that they will keep her in the loop regarding her plan.  CSW faxed clinicals for insurance auth to [employer].   Expected Discharge Plan: [place] Barriers to Discharge: Continued Medical Work up  Expected Discharge Plan and Services Expected Discharge Plan: [place] In-house Referral: Clinical Social Work(TOC referral)     Living arrangements for the past 2 months: Apartment                                       Social Determinants of Health (SDOH) Interventions    Readmission Risk Interventions No flowsheet data found.  Emeterio Reeve, Latanya Presser, [place] [facility] [place] Social Worker [phone]

## 2020-03-23 LAB — GLUCOSE, CAPILLARY
Glucose-Capillary: 146 mg/dL — ABNORMAL HIGH (ref 70–99)
Glucose-Capillary: 202 mg/dL — ABNORMAL HIGH (ref 70–99)
Glucose-Capillary: 80 mg/dL (ref 70–99)
Glucose-Capillary: 78 mg/dL (ref 70–99)
Glucose-Capillary: 75 mg/dL (ref 70–99)

## 2020-03-23 LAB — BASIC METABOLIC PANEL
Anion gap: 8 (ref 5–15)
BUN: 34 mg/dL — ABNORMAL HIGH (ref 8–23)
CO2: 25 mmol/L (ref 22–32)
Calcium: 9.7 mg/dL (ref 8.9–10.3)
Chloride: 99 mmol/L (ref 98–111)
Creatinine, Ser: 1.1 mg/dL — ABNORMAL HIGH (ref 0.44–1.00)
GFR calc Af Amer: 57 mL/min — ABNORMAL LOW (ref >60)
GFR calc non Af Amer: 49 mL/min — ABNORMAL LOW (ref >60)
Glucose, Bld: 161 mg/dL — ABNORMAL HIGH (ref 70–99)
Potassium: 3.9 mmol/L (ref 3.5–5.1)
Sodium: 132 mmol/L — ABNORMAL LOW (ref 135–145)

## 2020-03-23 LAB — MAGNESIUM: Magnesium: 1.7 mg/dL (ref 1.7–2.4)

## 2020-03-23 NOTE — Progress Notes (Signed)
Physical Therapy Treatment Patient Details Name: CHARLETT HINKLEY MRN: [id number] DOB: May 01, 1946 Today's Date: 03/23/2020    History of Present Illness Pt is a 74 yo female s/p UTI, diabetic foot ulcer, DKA and cellulitis. EGD on 4/11 revealing esophagitis, gastritis, and duodenitis. PMHx: dementia, T2DM, COPD, CHF,     PT Comments    Patient willing to participate in session today w/o requiring additional encouragement. She was able to manage B LE off of bed and required minA to get trunk upright and scoot EOB. She required minA +2 to stand, progressing to modA +2 to pivot to chair d/t fecal incontinence. Pt required modA + 1 to stand x 7 from chair requiring second PT assist for peri-care and cleanup d/t multiple bouts of fecal incontinence. Cognition continues to be impaired, she requires inc time, repetition of commands and patience to complete tasks. She was able to recall day of the week but is not oriented to place or situation. Pt very pleasant and happy, laughing throughout session. Hopeful to progress mobility and activity tolerance with improved participation. Pt would continue to benefit from skilled physical therapy services at this time while admitted and after d/c to address the below listed limitations in order to improve overall safety and independence with functional mobility.   Follow Up Recommendations  SNF;Supervision/Assistance - 24 hour     Equipment Recommendations  Other (comment)(defer to next venue of care)    Recommendations for Other Services       Precautions / Restrictions Precautions Precautions: Fall    Mobility  Bed Mobility Overal bed mobility: Needs Assistance Bed Mobility: Supine to Sit     Supine to sit: Min assist     General bed mobility comments: Pt able to manage B LE off of bed, requires minA to get trunk upright and scoot EOB  Transfers Overall transfer level: Needs assistance Equipment used: 2 person hand held assist Transfers: Sit  to/from [facility] Sit to Stand: Min assist;+2 physical assistance;From elevated surface Stand pivot transfers: Mod assist;+2 physical assistance       General transfer comment: minA +2 to stand from EOB, progressed to modA +2 to pivot to chair due to dec ability to follow commands and dec awareness of fecal incontinence. ModA of 1 to stand from chair x 7 and assist from other PT for peri-care  Ambulation/Gait             General Gait Details: deferred d/t multiple bouts of fecal incontinence   Stairs             Wheelchair Mobility    Modified Rankin (Stroke Patients Only)       Balance Overall balance assessment: Needs assistance Sitting-balance support: No upper extremity supported;Feet supported Sitting balance-Leahy Scale: Poor Sitting balance - Comments: pt able to sit ind. but randomly leans posteriorly requiring min-maxA to maintain sitting balance Postural control: Posterior lean Standing balance support: Bilateral upper extremity supported;During functional activity Standing balance-Leahy Scale: Poor Standing balance comment: reliant on external support                            Cognition Arousal/Alertness: Awake/alert Behavior During Therapy: WFL for tasks assessed/performed Overall Cognitive Status: Impaired/Different from baseline Area of Impairment: Orientation;Attention;Memory;Following commands;Safety/judgement;Awareness;Problem solving                 Orientation Level: Disoriented to;Place;Situation Current Attention Level: Sustained Memory: Decreased short-term memory Following Commands: Follows one step  commands with increased time Safety/Judgement: Decreased awareness of safety;Decreased awareness of deficits Awareness: Emergent Problem Solving: Slow processing;Decreased initiation;Requires verbal cues General Comments: Pt with dementia at baseline. Able to recall day of the week. Possible  hallucinations, pointing out the window stating there is a farmers market. Very pleasant and cooperative today      Exercises      General Comments General comments (skin integrity, edema, etc.): Pt with multiple bouts of fecal incontinence requiring assistance for peri-care      Pertinent Vitals/Pain Pain Assessment: Faces Faces Pain Scale: No hurt    Home Living                      Prior Function            PT Goals (current goals can now be found in the care plan section) Acute Rehab PT Goals PT Goal Formulation: With patient Time For Goal Achievement: 04/07/20 Potential to Achieve Goals: Fair Progress towards PT goals: Progressing toward goals    Frequency    Min 2X/week      PT Plan Current plan remains appropriate    Co-evaluation              AM-PAC PT "6 Clicks" Mobility   Outcome Measure  Help needed turning from your back to your side while in a flat bed without using bedrails?: A Little Help needed moving from lying on your back to sitting on the side of a flat bed without using bedrails?: A Little Help needed moving to and from a bed to a chair (including a wheelchair)?: A Lot Help needed standing up from a chair using your arms (e.g., wheelchair or bedside chair)?: A Lot Help needed to walk in hospital room?: A Lot Help needed climbing 3-5 steps with a railing? : Total 6 Click Score: 13    End of Session Equipment Utilized During Treatment: Gait belt Activity Tolerance: Patient tolerated treatment well Patient left: in chair;with call bell/phone within reach;with chair alarm set;Other (comment)(on bedpan- RN and NT notified) Nurse Communication: Mobility status;Other (comment)(Pt on bedpan ) PT Visit Diagnosis: Other abnormalities of gait and mobility (R26.89);Muscle weakness (generalized) (M62.81);Pain Pain - Right/Left: Left Pain - part of body: Shoulder     Time: 1427-1540 PT Time Calculation (min) (ACUTE ONLY): 73  min  Charges:  $Therapeutic Activity: 68-82 mins                     Julis Haubner, SPT Acute Rehab  [id number]   Kavitha Lansdale 03/23/2020, 4:43 PM

## 2020-03-23 NOTE — Progress Notes (Signed)
PT Progress Note for Charges    03/23/20 1639  PT Visit Information  Last PT Received On 03/23/20  PT General Charges  $$ ACUTE PT VISIT 1 Visit  PT Treatments  $Therapeutic Activity 68-82 mins  Anastasio Champion, DPT  Acute Rehabilitation Services Pager [phone] Office [phone]

## 2020-03-23 NOTE — Plan of Care (Signed)
  Problem: Nutrition: Goal: Adequate nutrition will be maintained Outcome: Progressing   

## 2020-03-23 NOTE — TOC Progression Note (Addendum)
Transition of Care [facility]) - Progression Note    Patient Details  Name: Yvonne [name] MRN: [id number] Date of Birth: 10/15/1946  Transition of Care [facility]) CM/SW [place], [place] Phone Number: 03/23/2020, 10:27 AM  Clinical Narrative:    CSW completed fast track appeal for insurance authorization with [facility] reference [id number]. Clinicals faxed to [phone].  [place] contacted CSW to inform a fast track appeal is required for patient's insurance authorization [id number] Option 2, ref [id number]. Fax [phone].   CSW contacted [facility] to follow-up on Pasrr. CW informed Pasrr is still pending for in-person review. CSW will continue to follow.   Expected Discharge Plan: [place] Barriers to Discharge: Continued Medical Work up  Expected Discharge Plan and Services Expected Discharge Plan: [place] In-house Referral: Clinical Social Work(TOC referral)     Living arrangements for the past 2 months: Apartment                                       Social Determinants of Health (SDOH) Interventions    Readmission Risk Interventions No flowsheet data found.

## 2020-03-23 NOTE — Progress Notes (Signed)
PROGRESS NOTE    Yvonne [name]  [id number] DOB: [birth year] DOA: 02/27/2020 PCP: Tresa Garter, MD    Brief Narrative:  74 year old female with dementia, DM 2, COPD, CHF, hyperlipidemia presented to hospital on 02/27/20 with altered mental status, high blood sugar, hematemesis.  Patient's daughter-in-law found her at home vomiting some blood.  Patient was seen in the ER and was found to have significant electrolyte imbalance with lactic acidosis, hyponatremia hyperkalemia, DKA, UTI, diabetic foot ulcer/cellulitis.  Patient has not been compliant with her insulin regimen, and family has been out of touch for few months-it appears patient has been noncompliant with medication, and also with complicated and fractured family dynamics.  In ER, CT head was negative for acute finding, chest x-ray unremarkable, MRI of the lower extremities no evidence of cellulitis EKG prolonged QTC. Patient was seen by GI underwent barium swallow that showed delayed passage of contrast at the GE junction and distal esophagus radiology concerned about pseudoachalasia and subsequently underwent  EGD 03/05/2020-found to have extensive esophagitis, gastritis and duodenitis.Biopsies were taken. No mass was seen at the GE junction. No dilatation was performed for the subtle mild stricture at GEJ due to the severe esophagitis. Recommendations are for PPI and sucralfate. The patient has been started on a clear liquid diet. The patient's DIL [name] stated that if the patient does not improve her PO intake, that she would be interested in hospice. DIL [name] has stated that both she and [name] are interested in a feeding tube. The patient no longer wants to talk to any of her family members about it and does not want feeding tube.    Assessment & Plan:   Active Problems:   DKA (diabetic ketoacidoses) ([place])   Dysphagia   Hematemesis with nausea  DKA/AGMA/Metabolic alkalosis:Resolved.Suspect secondary to UTI/diabetic  foot wound/noncompliance. Seems stable currently  Type 2 diabetes mellitus poorly controlled, with insulin noncompliance.  Hemoglobin A1c 10.1. Currently on lantus to 32 units and 8 units meal coverage.  Continue current management as blood sugar remains controlled.  Hematemesis/extensive esophagitis, gastritis and duodenitis/nausea vomiting: Was earlier by GI, underwent EGD, will continue on PPI as tolerated. Nausea vomiting resolved.   Anorexia in the setting of uncontrolled diabetes, esophagitis gastritis and dementia-started on Megace, encourage oral intake.  Pt reportedly does not desire to have a feeding tube. Dietitian had been following, recommendation for nutritional supplements  Foot wound diabetic versus vascular: on Lt Grt toe. healing well. Cont wound care. blood culture no growth. Monitor, cont wound care. ABI reviewed. Mild vascular disease on R, normal on L Remains stable currently  Suspected UTI, with abnormal UA on admission treated with IV antibiotics cultures no growth.  Had been continued off abx and stable  Hypokalemia: 3.1 today.  Will replace orally.  Recheck in the morning.  Recheck magnesium in the morning as well.  History of COPD: Remains on room air and stable.  Chronic pain Pt had been continued on Neurontin  Anxiety/insomnia on low-dose as needed Xanax  Acute on chronic diastolic CHF last ejection normal with findings consistent with diastoilc dysfunction.  BLE edema noted on exam but improving. Wt up to 111kg on 03/21/2020 from 102kg at last check.  Doubt accuracy as patient does not seem to be overloaded.  Continue Lasix 40 mg p.o. twice daily.  Hyponatremia in the setting of hyperglycemia, remains low, suspect related to volume overload per above. Improving with lasix  Hypocalcemia:  resolved.   Noncompliance-counselled recently  Morbid obesity with BMI  34: Will definitely benefit with weight loss and healthy lifestyle but notes at home is  feasible in the current setting.  SVT Brief and self limiting noted recently Currently on low dose metoprolol 12.5mg  bid with hold parameters HR stable at this time  Dementia Unsafe living condition reported by family over phone. Family had noticed gradual decline consistent with worsening dementia. Family is agreeable to short term SNF with plans to have pt move in with son after rehab stay SW following for placement   DVT prophylaxis: SCD's Code Status: DNR Family Communication: No family present at the bedside.  Status is: Inpatient  Remains inpatient appropriate because: Unsafe DC plan   Dispo: The patient is from: Home              Anticipated d/c is to: SNF              Anticipated d/c date is: Uncertain as we are waiting for placement arrangements. passr pending.              Patient currently is medically ready for discharge   Consultants:   GI  Palliative Care  Procedures:   EGD  Antimicrobials: Anti-infectives (From admission, onward)   Start     Dose/Rate Route Frequency Ordered Stop   02/28/20 0030  cefTRIAXone (ROCEPHIN) 1 g in sodium chloride 0.9 % 100 mL IVPB  Status:  Discontinued     1 g 200 mL/hr over 30 Minutes Intravenous Every 24 hours 02/28/20 0016 03/03/20 1304      Subjective: Patient seen and examined.  She has no complaints.  Objective: Vitals:   03/22/20 1814 03/22/20 2105 03/23/20 0440 03/23/20 0902  BP: (!) 116/58 122/67 (!) 95/31 112/65  Pulse: 85 95 95 90  Resp: 16 18 18 16   Temp: 98.2 F (36.8 C) 98.4 F (36.9 C) 98.6 F (37 C) 98.4 F (36.9 C)  TempSrc: Oral Oral Oral Oral  SpO2: 98% 98% 98% 97%  Weight:      Height:        Intake/Output Summary (Last 24 hours) at 03/23/2020 1310 Last data filed at 03/23/2020 0900 Gross per 24 hour  Intake 420 ml  Output 1100 ml  Net -680 ml   Filed Weights   03/17/20 2056 03/18/20 2038 03/21/20 0910  Weight: 101 kg 102 kg 111 kg    Examination:  General exam: Appears calm  and comfortable  Respiratory system: Clear to auscultation. Respiratory effort normal. Cardiovascular system: S1 & S2 heard, RRR. No JVD, murmurs, rubs, gallops or clicks. No pedal edema. Gastrointestinal system: Abdomen is nondistended, soft and nontender. No organomegaly or masses felt. Normal bowel sounds heard. Central nervous system: Alert and oriented. No focal neurological deficits. Extremities: Symmetric 5 x 5 power. Skin: No rashes, lesions or ulcers.  Psychiatry: Judgement and insight appear normal. Mood & affect appropriate.   Data Reviewed: I have personally reviewed following labs and imaging studies  CBC: No results for input(s): WBC, NEUTROABS, HGB, HCT, MCV, PLT in the last 168 hours. Basic Metabolic Panel: Recent Labs  Lab 03/17/20 0345 03/17/20 0345 03/18/20 0645 03/18/20 0645 03/19/20 0825 03/20/20 0600 03/21/20 0659 03/22/20 0414 03/23/20 0503  NA 130*   < > 129*   < > 130* 131* 131* 130* 132*  K 5.2*   < > 4.6   < > 5.1 4.6 4.4 3.1* 3.9  CL 100   < > 99   < > 100 98 99 97* 99  CO2 20*   < >  23   < > 19* 23 24 16* 25  GLUCOSE 170*   < > 257*   < > 146* 249* 151* 156* 161*  BUN 16   < > 17   < > 17 23 28* 32* 34*  CREATININE 0.70   < > 0.68   < > 0.58 0.89 0.92 0.80 1.10*  CALCIUM 9.4   < > 9.2   < > 9.4 9.5 9.4 9.9 9.7  MG 1.5*  --  2.3  --   --   --   --   --  1.7   < > = values in this interval not displayed.   GFR: Estimated Creatinine Clearance: 52 mL/min (A) (by C-G formula based on SCr of 1.1 mg/dL (H)). Liver Function Tests: Recent Labs  Lab 03/18/20 0645 03/19/20 0825 03/20/20 0600 03/21/20 0659 03/22/20 0414  AST 20 28 29  39 19  ALT 20 20 21 22 19   ALKPHOS 75 77 74 65 74  BILITOT 0.3 0.7 0.5 1.2 0.3  PROT 5.3* 5.8* 5.4* 5.1* 5.9*  ALBUMIN 2.3* 2.4* 2.3* 2.3* 2.6*   No results for input(s): LIPASE, AMYLASE in the last 168 hours. No results for input(s): AMMONIA in the last 168 hours. Coagulation Profile: No results for input(s): INR,  PROTIME in the last 168 hours. Cardiac Enzymes: No results for input(s): CKTOTAL, CKMB, CKMBINDEX, TROPONINI in the last 168 hours. BNP (last 3 results) No results for input(s): PROBNP in the last 8760 hours. HbA1C: No results for input(s): HGBA1C in the last 72 hours. CBG: Recent Labs  Lab 03/22/20 1130 03/22/20 1701 03/22/20 2104 03/23/20 0637 03/23/20 1113  GLUCAP 141* 140* 140* 146* 202*   Lipid Profile: No results for input(s): CHOL, HDL, LDLCALC, TRIG, CHOLHDL, LDLDIRECT in the last 72 hours. Thyroid Function Tests: No results for input(s): TSH, T4TOTAL, FREET4, T3FREE, THYROIDAB in the last 72 hours. Anemia Panel: No results for input(s): VITAMINB12, FOLATE, FERRITIN, TIBC, IRON, RETICCTPCT in the last 72 hours. Sepsis Labs: No results for input(s): PROCALCITON, LATICACIDVEN in the last 168 hours.  No results found for this or any previous visit (from the past 240 hour(s)).   Radiology Studies: No results found.  Scheduled Meds: . collagenase   Topical Daily  . DULoxetine  30 mg Oral Daily  . feeding supplement (PRO-STAT SUGAR FREE 64)  30 mL Oral BID  . furosemide  40 mg Oral BID  . gabapentin  100 mg Oral TID  . insulin aspart  0-15 Units Subcutaneous TID WC  . insulin aspart  0-5 Units Subcutaneous QHS  . insulin aspart  8 Units Subcutaneous TID WC  . insulin glargine  32 Units Subcutaneous QHS  . megestrol  200 mg Oral BID  . metoprolol tartrate  12.5 mg Oral BID  . multivitamin with minerals  1 tablet Oral Daily  . nystatin  5 mL Oral QID  . pantoprazole  40 mg Oral Q12H  . Ensure Max Protein  11 oz Oral TID  . sodium chloride flush  10-40 mL Intracatheter Q12H  . sodium chloride  2 g Oral BID WC  . sucralfate  1 g Oral Q6H   Continuous Infusions: . sodium chloride 250 mL/hr at 03/08/20 0957     LOS: 24 days   Total time spent 28 minutes Darliss Cheney, MD [facility] Pager On Amion  If 7PM-7AM, please contact night-coverage 03/23/2020,  1:10 PM

## 2020-03-24 LAB — BASIC METABOLIC PANEL
Anion gap: 12 (ref 5–15)
BUN: 28 mg/dL — ABNORMAL HIGH (ref 8–23)
CO2: 22 mmol/L (ref 22–32)
Calcium: 9.1 mg/dL (ref 8.9–10.3)
Chloride: 95 mmol/L — ABNORMAL LOW (ref 98–111)
Creatinine, Ser: 0.73 mg/dL (ref 0.44–1.00)
GFR calc Af Amer: >60 mL/min (ref >60)
GFR calc non Af Amer: >60 mL/min (ref >60)
Glucose, Bld: 102 mg/dL — ABNORMAL HIGH (ref 70–99)
Potassium: 3.5 mmol/L (ref 3.5–5.1)
Sodium: 129 mmol/L — ABNORMAL LOW (ref 135–145)

## 2020-03-24 LAB — GLUCOSE, CAPILLARY
Glucose-Capillary: 85 mg/dL (ref 70–99)
Glucose-Capillary: 96 mg/dL (ref 70–99)
Glucose-Capillary: 60 mg/dL — ABNORMAL LOW (ref 70–99)
Glucose-Capillary: 90 mg/dL (ref 70–99)
Glucose-Capillary: 137 mg/dL — ABNORMAL HIGH (ref 70–99)

## 2020-03-24 MED ORDER — NYSTATIN 100000 UNIT/GM EX POWD
Freq: Three times a day (TID) | CUTANEOUS | Status: DC
  Filled 2020-03-24 (×2): qty 15

## 2020-03-24 MED ORDER — POTASSIUM CHLORIDE CRYS ER 20 MEQ PO TBCR
40.0000 meq | EXTENDED_RELEASE_TABLET | Freq: Once | ORAL | Status: AC
  Administered 2020-03-24: 40 meq via ORAL
  Filled 2020-03-24: qty 2

## 2020-03-24 NOTE — Progress Notes (Signed)
PROGRESS NOTE    Yvonne [name]  [id number] DOB: [birth year] DOA: 02/27/2020 PCP: Tresa Garter, MD    Brief Narrative:  74 year old female with dementia, DM 2, COPD, CHF, hyperlipidemia presented to hospital on 02/27/20 with altered mental status, high blood sugar, hematemesis.  Patient's daughter-in-law found her at home vomiting some blood.  Patient was seen in the ER and was found to have significant electrolyte imbalance with lactic acidosis, hyponatremia hyperkalemia, DKA, UTI, diabetic foot ulcer/cellulitis.  Patient has not been compliant with her insulin regimen, and family has been out of touch for few months-it appears patient has been noncompliant with medication, and also with complicated and fractured family dynamics.  In ER, CT head was negative for acute finding, chest x-ray unremarkable, MRI of the lower extremities no evidence of cellulitis EKG prolonged QTC. Patient was seen by GI underwent barium swallow that showed delayed passage of contrast at the GE junction and distal esophagus radiology concerned about pseudoachalasia and subsequently underwent  EGD 03/05/2020-found to have extensive esophagitis, gastritis and duodenitis.Biopsies were taken. No mass was seen at the GE junction. No dilatation was performed for the subtle mild stricture at GEJ due to the severe esophagitis. Recommendations are for PPI and sucralfate. The patient has been started on a clear liquid diet. The patient's DIL [name] stated that if the patient does not improve her PO intake, that she would be interested in hospice. DIL [name] has stated that both she and [name] are interested in a feeding tube. The patient no longer wants to talk to any of her family members about it and does not want feeding tube.    Assessment & Plan:   Active Problems:   DKA (diabetic ketoacidoses) ([place])   Dysphagia   Hematemesis with nausea  DKA/AGMA/Metabolic alkalosis:Resolved.Suspect secondary to UTI/diabetic  foot wound/noncompliance. Seems stable currently  Type 2 diabetes mellitus poorly controlled, with insulin noncompliance.  Hemoglobin A1c 10.1. Currently on lantus to 32 units and 8 units meal coverage.  Continue current management as blood sugar remains controlled.  Hematemesis/extensive esophagitis, gastritis and duodenitis/nausea vomiting: Was earlier by GI, underwent EGD, will continue on PPI as tolerated. Nausea vomiting resolved.   Anorexia in the setting of uncontrolled diabetes, esophagitis gastritis and dementia-started on Megace, encourage oral intake.  Pt reportedly does not desire to have a feeding tube. Dietitian had been following, recommendation for nutritional supplements  Foot wound diabetic versus vascular: on Lt Grt toe. healing well. Cont wound care. blood culture no growth. Monitor, cont wound care. ABI reviewed. Mild vascular disease on R, normal on L Remains stable currently  Candidiasis on inner thighs and under the breast: Start on nystatin powder.  Suspected UTI, with abnormal UA on admission treated with IV antibiotics cultures no growth.  Had been continued off abx and stable  Hypokalemia: Resolved but is still at borderline low 3.5.  Will provide with 1 more oral dose to prevent hypokalemia for tomorrow.  History of COPD: Remains on room air and stable.  Chronic pain Pt had been continued on Neurontin  Anxiety/insomnia on low-dose as needed Xanax  Acute on chronic diastolic CHF last ejection normal with findings consistent with diastoilc dysfunction.  BLE edema noted on exam but improving. Wt up to 111kg on 03/21/2020 from 102kg at last check.  Doubt accuracy as patient does not seem to be overloaded.  Continue Lasix 40 mg p.o. twice daily.  Hyponatremia in the setting of hyperglycemia, remains low, suspect related to volume overload per above. Improving  with lasix  Hypocalcemia:  resolved.   Noncompliance-counselled recently  Morbid obesity  with BMI 34: Will definitely benefit with weight loss and healthy lifestyle but notes at home is feasible in the current setting.  SVT Brief and self limiting noted recently Currently on low dose metoprolol 12.5mg  bid with hold parameters HR stable at this time  Dementia Unsafe living condition reported by family over phone. Family had noticed gradual decline consistent with worsening dementia. Family is agreeable to short term SNF with plans to have pt move in with son after rehab stay SW following for placement  DVT prophylaxis: SCD's Code Status: DNR Family Communication: No family present at the bedside.  Had a long conversation with her daughter-in-law [name] over the phone.  She wanted me to check patient's urine culture to see if her UTI has cleared.  I did inform her that repeating urine culture is not indicated per guidelines and will provide false positive results.  I did inform her that patient is medically stable and can be discharged anytime we receive insurance authorization as well as Passr.  She claimed that patient has been noted to be confused at times which is not her usual self.  I did inform her that in my opinion, patient may have underlying dementia however patient has remained alert, oriented to place and person for last 4 days that I have seen her.  I have personally not noted any confusion and neither I have been informed by nursing staff.  Status is: Inpatient  Remains inpatient appropriate because: Unsafe DC plan   Dispo: The patient is from: Home              Anticipated d/c is to: SNF              Anticipated d/c date is: Uncertain as we are waiting for placement arrangements. passr pending.  However she is medically ready for discharge.              Patient currently is medically ready for discharge   Consultants:   GI  Palliative Care  Procedures:   EGD  Antimicrobials: Anti-infectives (From admission, onward)   Start     Dose/Rate Route  Frequency Ordered Stop   02/28/20 0030  cefTRIAXone (ROCEPHIN) 1 g in sodium chloride 0.9 % 100 mL IVPB  Status:  Discontinued     1 g 200 mL/hr over 30 Minutes Intravenous Every 24 hours 02/28/20 0016 03/03/20 1304      Subjective: Patient seen and examined.  Busy with a eating breakfast.  Alert and oriented x2.  No complaints.  Objective: Vitals:   03/23/20 1657 03/23/20 2055 03/24/20 0446 03/24/20 0946  BP: 97/62 (!) 101/54 128/68 (!) 110/57  Pulse: 81 80 64 77  Resp: 18 16 18 18   Temp: 98.3 F (36.8 C) 98.2 F (36.8 C) 98.4 F (36.9 C) 98 F (36.7 C)  TempSrc: Oral  Oral   SpO2: 100% 100% 94% 98%  Weight:      Height:        Intake/Output Summary (Last 24 hours) at 03/24/2020 1050 Last data filed at 03/24/2020 0700 Gross per 24 hour  Intake 1140 ml  Output 506 ml  Net 634 ml   Filed Weights   03/17/20 2056 03/18/20 2038 03/21/20 0910  Weight: 101 kg 102 kg 111 kg    Examination:  General exam: Appears calm and comfortable  Respiratory system: Clear to auscultation. Respiratory effort normal. Cardiovascular system: S1 & S2 heard,  RRR. No JVD, murmurs, rubs, gallops or clicks. No pedal edema. Gastrointestinal system: Abdomen is nondistended, soft and nontender. No organomegaly or masses felt. Normal bowel sounds heard. Central nervous system: Alert and oriented x2. No focal neurological deficits. Extremities: Symmetric 5 x 5 power. Skin: No rashes, lesions or ulcers.  Psychiatry: Judgement and insight appear poo. Mood & affect appropriate.   Data Reviewed: I have personally reviewed following labs and imaging studies  CBC: No results for input(s): WBC, NEUTROABS, HGB, HCT, MCV, PLT in the last 168 hours. Basic Metabolic Panel: Recent Labs  Lab 03/18/20 0645 03/19/20 0825 03/20/20 0600 03/21/20 0659 03/22/20 0414 03/23/20 0503 03/24/20 0650  NA 129*   < > 131* 131* 130* 132* 129*  K 4.6   < > 4.6 4.4 3.1* 3.9 3.5  CL 99   < > 98 99 97* 99 95*  CO2 23    < > 23 24 16* 25 22  GLUCOSE 257*   < > 249* 151* 156* 161* 102*  BUN 17   < > 23 28* 32* 34* 28*  CREATININE 0.68   < > 0.89 0.92 0.80 1.10* 0.73  CALCIUM 9.2   < > 9.5 9.4 9.9 9.7 9.1  MG 2.3  --   --   --   --  1.7  --    < > = values in this interval not displayed.   GFR: Estimated Creatinine Clearance: 71.5 mL/min (by C-G formula based on SCr of 0.73 mg/dL). Liver Function Tests: Recent Labs  Lab 03/18/20 0645 03/19/20 0825 03/20/20 0600 03/21/20 0659 03/22/20 0414  AST 20 28 29  39 19  ALT 20 20 21 22 19   ALKPHOS 75 77 74 65 74  BILITOT 0.3 0.7 0.5 1.2 0.3  PROT 5.3* 5.8* 5.4* 5.1* 5.9*  ALBUMIN 2.3* 2.4* 2.3* 2.3* 2.6*   No results for input(s): LIPASE, AMYLASE in the last 168 hours. No results for input(s): AMMONIA in the last 168 hours. Coagulation Profile: No results for input(s): INR, PROTIME in the last 168 hours. Cardiac Enzymes: No results for input(s): CKTOTAL, CKMB, CKMBINDEX, TROPONINI in the last 168 hours. BNP (last 3 results) No results for input(s): PROBNP in the last 8760 hours. HbA1C: No results for input(s): HGBA1C in the last 72 hours. CBG: Recent Labs  Lab 03/23/20 1113 03/23/20 1623 03/23/20 2051 03/23/20 2254 03/24/20 0651  GLUCAP 202* 80 78 75 85   Lipid Profile: No results for input(s): CHOL, HDL, LDLCALC, TRIG, CHOLHDL, LDLDIRECT in the last 72 hours. Thyroid Function Tests: No results for input(s): TSH, T4TOTAL, FREET4, T3FREE, THYROIDAB in the last 72 hours. Anemia Panel: No results for input(s): VITAMINB12, FOLATE, FERRITIN, TIBC, IRON, RETICCTPCT in the last 72 hours. Sepsis Labs: No results for input(s): PROCALCITON, LATICACIDVEN in the last 168 hours.  No results found for this or any previous visit (from the past 240 hour(s)).   Radiology Studies: No results found.  Scheduled Meds: . collagenase   Topical Daily  . DULoxetine  30 mg Oral Daily  . feeding supplement (PRO-STAT SUGAR FREE 64)  30 mL Oral BID  .  furosemide  40 mg Oral BID  . gabapentin  100 mg Oral TID  . insulin aspart  0-15 Units Subcutaneous TID WC  . insulin aspart  0-5 Units Subcutaneous QHS  . insulin aspart  8 Units Subcutaneous TID WC  . insulin glargine  32 Units Subcutaneous QHS  . megestrol  200 mg Oral BID  . metoprolol tartrate  12.5 mg Oral BID  . multivitamin with minerals  1 tablet Oral Daily  . nystatin   Topical TID  . pantoprazole  40 mg Oral Q12H  . Ensure Max Protein  11 oz Oral TID  . sodium chloride  2 g Oral BID WC  . sucralfate  1 g Oral Q6H   Continuous Infusions: . sodium chloride 250 mL/hr at 03/08/20 0957     LOS: 25 days   Total time spent 35 minutes, which also includes talking to the family. Darliss Cheney, MD [facility] Pager On Amion  If 7PM-7AM, please contact night-coverage 03/24/2020, 10:50 AM

## 2020-03-24 NOTE — Plan of Care (Signed)
  Problem: Nutrition: Goal: Adequate nutrition will be maintained Outcome: Progressing   Problem: Coping: Goal: Level of anxiety will decrease Outcome: Progressing   

## 2020-03-25 LAB — BASIC METABOLIC PANEL
Anion gap: 10 (ref 5–15)
BUN: 28 mg/dL — ABNORMAL HIGH (ref 8–23)
CO2: 21 mmol/L — ABNORMAL LOW (ref 22–32)
Calcium: 9.2 mg/dL (ref 8.9–10.3)
Chloride: 101 mmol/L (ref 98–111)
Creatinine, Ser: 0.76 mg/dL (ref 0.44–1.00)
GFR calc Af Amer: >60 mL/min (ref >60)
GFR calc non Af Amer: >60 mL/min (ref >60)
Glucose, Bld: 81 mg/dL (ref 70–99)
Potassium: 3.9 mmol/L (ref 3.5–5.1)
Sodium: 132 mmol/L — ABNORMAL LOW (ref 135–145)

## 2020-03-25 LAB — GLUCOSE, CAPILLARY
Glucose-Capillary: 76 mg/dL (ref 70–99)
Glucose-Capillary: 107 mg/dL — ABNORMAL HIGH (ref 70–99)
Glucose-Capillary: 155 mg/dL — ABNORMAL HIGH (ref 70–99)
Glucose-Capillary: 129 mg/dL — ABNORMAL HIGH (ref 70–99)

## 2020-03-25 MED ORDER — INSULIN ASPART 100 UNIT/ML ~~LOC~~ SOLN
5.0000 [IU] | Freq: Three times a day (TID) | SUBCUTANEOUS | Status: DC
  Administered 2020-03-25 – 2020-03-29 (×14): 5 [IU] via SUBCUTANEOUS

## 2020-03-25 NOTE — Progress Notes (Signed)
PROGRESS NOTE    Yvonne [name]  [id number] DOB: 01/03/1946 DOA: 02/27/2020 PCP: Tresa Garter, MD    Brief Narrative:  74 year old female with dementia, DM 2, COPD, CHF, hyperlipidemia presented to hospital on 02/27/20 with altered mental status, high blood sugar, hematemesis.  Patient's daughter-in-law found her at home vomiting some blood.  Patient was seen in the ER and was found to have significant electrolyte imbalance with lactic acidosis, hyponatremia hyperkalemia, DKA, UTI, diabetic foot ulcer/cellulitis.  Patient has not been compliant with her insulin regimen, and family has been out of touch for few months-it appears patient has been noncompliant with medication, and also with complicated and fractured family dynamics.  In ER, CT head was negative for acute finding, chest x-ray unremarkable, MRI of the lower extremities no evidence of cellulitis EKG prolonged QTC. Patient was seen by GI underwent barium swallow that showed delayed passage of contrast at the GE junction and distal esophagus radiology concerned about pseudoachalasia and subsequently underwent  EGD 03/05/2020-found to have extensive esophagitis, gastritis and duodenitis.Biopsies were taken. No mass was seen at the GE junction. No dilatation was performed for the subtle mild stricture at GEJ due to the severe esophagitis. Recommendations are for PPI and sucralfate. The patient has been started on a clear liquid diet. The patient's DIL [name] stated that if the patient does not improve her PO intake, that she would be interested in hospice. DIL [name] has stated that both she and [name] are interested in a feeding tube. The patient no longer wants to talk to any of her family members about it and does not want feeding tube.    Assessment & Plan:   Active Problems:   DKA (diabetic ketoacidoses) ([place])   Dysphagia   Hematemesis with nausea  DKA/AGMA/Metabolic alkalosis:Resolved.Suspect secondary to UTI/diabetic  foot wound/noncompliance. Seems stable currently  Type 2 diabetes mellitus poorly controlled, with insulin noncompliance.  Hemoglobin A1c 10.1. Currently on lantus to 32 units and 8 units meal coverage.  Mostly controlled with intermittent hypoglycemia.  Continue current Lantus however reduce Premeal regimen to 5 units and continue SSI.    Hematemesis/extensive esophagitis, gastritis and duodenitis/nausea vomiting: Was seen earlier by GI, underwent EGD, will continue on PPI as tolerated. Nausea vomiting resolved.   Anorexia in the setting of uncontrolled diabetes, esophagitis gastritis and dementia-started on Megace, encourage oral intake.  Pt reportedly does not desire to have a feeding tube. Dietitian had been following, recommendation for nutritional supplements  Foot wound diabetic versus vascular: on Lt Grt toe. healing well. Cont wound care. blood culture no growth. Monitor, cont wound care. ABI reviewed. Mild vascular disease on R, normal on L Remains stable currently  Candidiasis on inner thighs and under the breast: Continue on nystatin powder.  Suspected UTI, with abnormal UA on admission treated with IV antibiotics cultures no growth.  Had been continued off abx and stable  Hypokalemia: Resolved.  History of COPD: Remains on room air and stable.  Chronic pain Pt had been continued on Neurontin  Anxiety/insomnia on low-dose as needed Xanax  Acute on chronic diastolic CHF last ejection normal with findings consistent with diastoilc dysfunction.  BLE edema noted on exam but improving. Wt up to 111kg on 03/21/2020 from 102kg at last check.  Doubt accuracy as patient does not seem to be overloaded.  Continue Lasix 40 mg p.o. twice daily.  Hyponatremia in the setting of hyperglycemia, remains low, suspect related to volume overload per above. Improving with lasix  Hypocalcemia:  resolved.  Noncompliance-counselled recently  Morbid obesity with BMI 34: Will definitely  benefit with weight loss and healthy lifestyle but notes at home is feasible in the current setting.  SVT Brief and self limiting noted recently Currently on low dose metoprolol 12.5mg  bid with hold parameters HR stable at this time  Dementia Unsafe living condition reported by family over phone. Family had noticed gradual decline consistent with worsening dementia. Family is agreeable to short term SNF with plans to have pt move in with son after rehab stay [facility] following for placement  DVT prophylaxis: SCD's Code Status: DNR Family Communication: No family present at the bedside.  Had a long conversation with her daughter-in-law [name] over the phone on 03/24/2020.  She wanted to check patient's urine culture to see if her UTI has cleared.  I did inform her that repeating urine culture is not indicated per guidelines and will provide false positive results.  I did inform her that patient is medically stable and can be discharged anytime we receive insurance authorization as well as Passr.  She claimed that patient has been noted to be confused at times which is not her usual self.  I did inform her that in my opinion, patient may have underlying dementia however patient has remained alert, oriented to place and person for last 4 days that I have seen her.  I have personally not noted any confusion and neither I have been informed by nursing staff.  Status is: Inpatient  Remains inpatient appropriate because: Unsafe DC plan   Dispo: The patient is from: Home              Anticipated d/c is to: SNF              Anticipated d/c date is: Uncertain as we are waiting for placement arrangements. passr pending.  However she is medically ready for discharge.              Patient currently is medically ready for discharge   Consultants:   GI  Palliative Care  Procedures:   EGD  Antimicrobials: Anti-infectives (From admission, onward)   Start     Dose/Rate Route Frequency Ordered Stop    02/28/20 0030  cefTRIAXone (ROCEPHIN) 1 g in sodium chloride 0.9 % 100 mL IVPB  Status:  Discontinued     1 g 200 mL/hr over 30 Minutes Intravenous Every 24 hours 02/28/20 0016 03/03/20 1304      Subjective: Seen and examined.  She has no complaints.  Objective: Vitals:   03/24/20 2131 03/25/20 0419 03/25/20 0500 03/25/20 0710  BP: (!) 100/54 111/70  115/73  Pulse: 79 66  69  Resp: 18 16  18   Temp: (!) 97.4 F (36.3 C) 98 F (36.7 C)  98 F (36.7 C)  TempSrc: Axillary Oral  Oral  SpO2: 100% 99%  98%  Weight: 112 kg  112 kg   Height:        Intake/Output Summary (Last 24 hours) at 03/25/2020 1036 Last data filed at 03/25/2020 0714 Gross per 24 hour  Intake 600 ml  Output 2200 ml  Net -1600 ml   Filed Weights   03/21/20 0910 03/24/20 2131 03/25/20 0500  Weight: 111 kg 112 kg 112 kg    Examination:  General exam: Appears calm and comfortable  Respiratory system: Clear to auscultation. Respiratory effort normal. Cardiovascular system: S1 & S2 heard, RRR. No JVD, murmurs, rubs, gallops or clicks. No pedal edema. Gastrointestinal system: Abdomen is nondistended, soft and  nontender. No organomegaly or masses felt. Normal bowel sounds heard. Central nervous system: Alert and oriented x2. No focal neurological deficits. Extremities: Symmetric 5 x 5 power. Skin: No rashes, lesions or ulcers.  Erythema under the breast bilaterally and on the abdominal fold Psychiatry: Judgement and insight appear poor. Mood & affect appropriate.    Data Reviewed: I have personally reviewed following labs and imaging studies  CBC: No results for input(s): WBC, NEUTROABS, HGB, HCT, MCV, PLT in the last 168 hours. Basic Metabolic Panel: Recent Labs  Lab 03/21/20 0659 03/22/20 0414 03/23/20 0503 03/24/20 0650 03/25/20 0350  NA 131* 130* 132* 129* 132*  K 4.4 3.1* 3.9 3.5 3.9  CL 99 97* 99 95* 101  CO2 24 16* 25 22 21*  GLUCOSE 151* 156* 161* 102* 81  BUN 28* 32* 34* 28* 28*  CREATININE  0.92 0.80 1.10* 0.73 0.76  CALCIUM 9.4 9.9 9.7 9.1 9.2  MG  --   --  1.7  --   --    GFR: Estimated Creatinine Clearance: 71.8 mL/min (by C-G formula based on SCr of 0.76 mg/dL). Liver Function Tests: Recent Labs  Lab 03/19/20 0825 03/20/20 0600 03/21/20 0659 03/22/20 0414  AST 28 29 39 19  ALT 20 21 22 19   ALKPHOS 77 74 65 74  BILITOT 0.7 0.5 1.2 0.3  PROT 5.8* 5.4* 5.1* 5.9*  ALBUMIN 2.4* 2.3* 2.3* 2.6*   No results for input(s): LIPASE, AMYLASE in the last 168 hours. No results for input(s): AMMONIA in the last 168 hours. Coagulation Profile: No results for input(s): INR, PROTIME in the last 168 hours. Cardiac Enzymes: No results for input(s): CKTOTAL, CKMB, CKMBINDEX, TROPONINI in the last 168 hours. BNP (last 3 results) No results for input(s): PROBNP in the last 8760 hours. HbA1C: No results for input(s): HGBA1C in the last 72 hours. CBG: Recent Labs  Lab 03/24/20 1106 03/24/20 1604 03/24/20 1653 03/24/20 2132 03/25/20 0639  GLUCAP 96 60* 90 137* 76   Lipid Profile: No results for input(s): CHOL, HDL, LDLCALC, TRIG, CHOLHDL, LDLDIRECT in the last 72 hours. Thyroid Function Tests: No results for input(s): TSH, T4TOTAL, FREET4, T3FREE, THYROIDAB in the last 72 hours. Anemia Panel: No results for input(s): VITAMINB12, FOLATE, FERRITIN, TIBC, IRON, RETICCTPCT in the last 72 hours. Sepsis Labs: No results for input(s): PROCALCITON, LATICACIDVEN in the last 168 hours.  No results found for this or any previous visit (from the past 240 hour(s)).   Radiology Studies: No results found.  Scheduled Meds: . collagenase   Topical Daily  . DULoxetine  30 mg Oral Daily  . feeding supplement (PRO-STAT SUGAR FREE 64)  30 mL Oral BID  . furosemide  40 mg Oral BID  . gabapentin  100 mg Oral TID  . insulin aspart  0-15 Units Subcutaneous TID WC  . insulin aspart  0-5 Units Subcutaneous QHS  . insulin aspart  8 Units Subcutaneous TID WC  . insulin glargine  32 Units  Subcutaneous QHS  . megestrol  200 mg Oral BID  . metoprolol tartrate  12.5 mg Oral BID  . multivitamin with minerals  1 tablet Oral Daily  . nystatin   Topical TID  . pantoprazole  40 mg Oral Q12H  . Ensure Max Protein  11 oz Oral TID  . sodium chloride  2 g Oral BID WC  . sucralfate  1 g Oral Q6H   Continuous Infusions: . sodium chloride 250 mL/hr at 03/08/20 0957     LOS:  26 days   Total time spent 26 minutes Darliss Cheney, MD [facility] Pager On Amion  If 7PM-7AM, please contact night-coverage 03/25/2020, 10:36 AM

## 2020-03-25 NOTE — Plan of Care (Signed)
  Problem: Education: Goal: Knowledge of General Education information will improve Description: Including pain rating scale, medication(s)/side effects and non-pharmacologic comfort measures Outcome: Progressing   Problem: Safety: Goal: Ability to remain free from injury will improve Outcome: Progressing   

## 2020-03-25 NOTE — Social Work (Addendum)
CSW contacted [employer] [phone] to follow-up on insurance authorization. CSW informed authorization is still pending  Yvonne [name], MSW, [employer]

## 2020-03-26 LAB — GLUCOSE, CAPILLARY
Glucose-Capillary: 98 mg/dL (ref 70–99)
Glucose-Capillary: 135 mg/dL — ABNORMAL HIGH (ref 70–99)
Glucose-Capillary: 75 mg/dL (ref 70–99)
Glucose-Capillary: 117 mg/dL — ABNORMAL HIGH (ref 70–99)

## 2020-03-26 NOTE — Plan of Care (Signed)
  Problem: Safety: Goal: Ability to remain free from injury will improve Outcome: Progressing   Problem: Skin Integrity: Goal: Risk for impaired skin integrity will decrease Outcome: Progressing   

## 2020-03-26 NOTE — Social Work (Signed)
Received insurance authorization [id number].  [employer], LCSWA

## 2020-03-26 NOTE — Progress Notes (Signed)
PROGRESS NOTE    Yvonne [name]  [id number] DOB: [birth year] DOA: 02/27/2020 PCP: Tresa Garter, MD    Brief Narrative:  74 year old female with dementia, DM 2, COPD, CHF, hyperlipidemia presented to hospital on 02/27/20 with altered mental status, high blood sugar, hematemesis.  Patient's daughter-in-law found her at home vomiting some blood.  Patient was seen in the ER and was found to have significant electrolyte imbalance with lactic acidosis, hyponatremia hyperkalemia, DKA, UTI, diabetic foot ulcer/cellulitis.  Patient has not been compliant with her insulin regimen, and family has been out of touch for few months-it appears patient has been noncompliant with medication, and also with complicated and fractured family dynamics.  In ER, CT head was negative for acute finding, chest x-ray unremarkable, MRI of the lower extremities no evidence of cellulitis EKG prolonged QTC. Patient was seen by GI underwent barium swallow that showed delayed passage of contrast at the GE junction and distal esophagus radiology concerned about pseudoachalasia and subsequently underwent  EGD 03/05/2020-found to have extensive esophagitis, gastritis and duodenitis.Biopsies were taken. No mass was seen at the GE junction. No dilatation was performed for the subtle mild stricture at GEJ due to the severe esophagitis. Recommendations are for PPI and sucralfate. The patient has been started on a clear liquid diet. The patient's DIL [name] stated that if the patient does not improve her PO intake, that she would be interested in hospice. DIL [name] has stated that both she and [name] are interested in a feeding tube. The patient no longer wants to talk to any of her family members about it and does not want feeding tube.    Assessment & Plan:   Active Problems:   DKA (diabetic ketoacidoses) ([place])   Dysphagia   Hematemesis with nausea  DKA/AGMA/Metabolic alkalosis:Resolved.Suspect secondary to UTI/diabetic  foot wound/noncompliance. Seems stable currently  Type 2 diabetes mellitus poorly controlled, with insulin noncompliance.  Hemoglobin A1c 10.1.  Blood sugar now better.  Currently on lantus to 32 units. educed Premeal regimen to 5 units and continue SSI.    Hematemesis/extensive esophagitis, gastritis and duodenitis/nausea vomiting: Was seen earlier by GI, underwent EGD, will continue on PPI as tolerated. Nausea vomiting resolved.   Anorexia in the setting of uncontrolled diabetes, esophagitis gastritis and dementia-started on Megace, encourage oral intake.  Pt reportedly does not desire to have a feeding tube. Dietitian had been following, recommendation for nutritional supplements  Foot wound diabetic versus vascular: on Lt Grt toe. healing well. Cont wound care. blood culture no growth. Monitor, cont wound care. ABI reviewed. Mild vascular disease on R, normal on L Remains stable currently  Candidiasis on inner thighs and under the breast: Continue on nystatin powder.  Suspected UTI, with abnormal UA on admission treated with IV antibiotics cultures no growth.  Had been continued off abx and stable  Hypokalemia: Resolved.  History of COPD: Remains on room air and stable.  Chronic pain Pt had been continued on Neurontin  Anxiety/insomnia on low-dose as needed Xanax  Acute on chronic diastolic CHF last ejection normal with findings consistent with diastoilc dysfunction.  BLE edema noted on exam but improving. Wt up to 111kg on 03/21/2020 from 102kg at last check.  Doubt accuracy as patient does not seem to be overloaded.  Continue Lasix 40 mg p.o. twice daily.  Hyponatremia in the setting of hyperglycemia, remains low, suspect related to volume overload per above. Improving with lasix  Hypocalcemia:  resolved.   Noncompliance-counselled recently  Morbid obesity with BMI 34:  Will definitely benefit with weight loss and healthy lifestyle but notes at home is feasible in the  current setting.  SVT Brief and self limiting noted recently Currently on low dose metoprolol 12.5mg  bid with hold parameters HR stable at this time  Dementia Unsafe living condition reported by family over phone. Family had noticed gradual decline consistent with worsening dementia. Family is agreeable to short term SNF with plans to have pt move in with son after rehab stay SW following for placement  DVT prophylaxis: SCD's Code Status: DNR Family Communication: No family present at the bedside.  Had a long conversation with her daughter-in-law [name] over the phone on 03/24/2020.  She wanted to check patient's urine culture to see if her UTI has cleared.  I did inform her that repeating urine culture is not indicated per guidelines and will provide false positive results.  I did inform her that patient is medically stable and can be discharged anytime we receive insurance authorization as well as Passr.  She claimed that patient has been noted to be confused at times which is not her usual self.  I did inform her that in my opinion, patient may have underlying dementia however patient has remained alert, oriented to place and person for last 4 days that I have seen her.  I have personally not noted any confusion and neither I have been informed by nursing staff.  Status is: Inpatient  Remains inpatient appropriate because: Unsafe DC plan   Dispo: The patient is from: Home              Anticipated d/c is to: SNF              Anticipated d/c date is: Uncertain as we are waiting for placement arrangements. passr pending.  However she is medically ready for discharge.              Patient currently is medically ready for discharge   Consultants:   GI  Palliative Care  Procedures:   EGD  Antimicrobials: Anti-infectives (From admission, onward)   Start     Dose/Rate Route Frequency Ordered Stop   02/28/20 0030  cefTRIAXone (ROCEPHIN) 1 g in sodium chloride 0.9 % 100 mL IVPB   Status:  Discontinued     1 g 200 mL/hr over 30 Minutes Intravenous Every 24 hours 02/28/20 0016 03/03/20 1304      Subjective: Seen and examined.  She has no complaints.  Feels well.  Objective: Vitals:   03/25/20 1614 03/25/20 2006 03/26/20 0427 03/26/20 0755  BP: 111/62 122/84 106/64 118/68  Pulse: (!) 56 77 (!) 57 (!) 59  Resp: 17 17 18 18   Temp: 98.2 F (36.8 C) (!) 97.3 F (36.3 C) 98.7 F (37.1 C) 98 F (36.7 C)  TempSrc: Oral Oral Oral Oral  SpO2: 98% 100% 97% 100%  Weight:      Height:        Intake/Output Summary (Last 24 hours) at 03/26/2020 1309 Last data filed at 03/26/2020 0600 Gross per 24 hour  Intake 2710 ml  Output 500 ml  Net 2210 ml   Filed Weights   03/21/20 0910 03/24/20 2131 03/25/20 0500  Weight: 111 kg 112 kg 112 kg    Examination:  General exam: Appears calm and comfortable  Respiratory system: Clear to auscultation. Respiratory effort normal. Cardiovascular system: S1 & S2 heard, RRR. No JVD, murmurs, rubs, gallops or clicks. No pedal edema. Gastrointestinal system: Abdomen is nondistended, soft and nontender. No  organomegaly or masses felt. Normal bowel sounds heard. Central nervous system: Alert and oriented. No focal neurological deficits. Extremities: Symmetric 5 x 5 power. Skin: Erythema under the abdominal folds  Psychiatry: Judgement and insight appear poor. Mood & affect appropriate.    Data Reviewed: I have personally reviewed following labs and imaging studies  CBC: No results for input(s): WBC, NEUTROABS, HGB, HCT, MCV, PLT in the last 168 hours. Basic Metabolic Panel: Recent Labs  Lab 03/21/20 0659 03/22/20 0414 03/23/20 0503 03/24/20 0650 03/25/20 0350  NA 131* 130* 132* 129* 132*  K 4.4 3.1* 3.9 3.5 3.9  CL 99 97* 99 95* 101  CO2 24 16* 25 22 21*  GLUCOSE 151* 156* 161* 102* 81  BUN 28* 32* 34* 28* 28*  CREATININE 0.92 0.80 1.10* 0.73 0.76  CALCIUM 9.4 9.9 9.7 9.1 9.2  MG  --   --  1.7  --   --     GFR: Estimated Creatinine Clearance: 71.8 mL/min (by C-G formula based on SCr of 0.76 mg/dL). Liver Function Tests: Recent Labs  Lab 03/20/20 0600 03/21/20 0659 03/22/20 0414  AST 29 39 19  ALT 21 22 19   ALKPHOS 74 65 74  BILITOT 0.5 1.2 0.3  PROT 5.4* 5.1* 5.9*  ALBUMIN 2.3* 2.3* 2.6*   No results for input(s): LIPASE, AMYLASE in the last 168 hours. No results for input(s): AMMONIA in the last 168 hours. Coagulation Profile: No results for input(s): INR, PROTIME in the last 168 hours. Cardiac Enzymes: No results for input(s): CKTOTAL, CKMB, CKMBINDEX, TROPONINI in the last 168 hours. BNP (last 3 results) No results for input(s): PROBNP in the last 8760 hours. HbA1C: No results for input(s): HGBA1C in the last 72 hours. CBG: Recent Labs  Lab 03/25/20 1056 03/25/20 1609 03/25/20 2146 03/26/20 0654 03/26/20 1125  GLUCAP 107* 155* 129* 98 135*   Lipid Profile: No results for input(s): CHOL, HDL, LDLCALC, TRIG, CHOLHDL, LDLDIRECT in the last 72 hours. Thyroid Function Tests: No results for input(s): TSH, T4TOTAL, FREET4, T3FREE, THYROIDAB in the last 72 hours. Anemia Panel: No results for input(s): VITAMINB12, FOLATE, FERRITIN, TIBC, IRON, RETICCTPCT in the last 72 hours. Sepsis Labs: No results for input(s): PROCALCITON, LATICACIDVEN in the last 168 hours.  No results found for this or any previous visit (from the past 240 hour(s)).   Radiology Studies: No results found.  Scheduled Meds: . collagenase   Topical Daily  . DULoxetine  30 mg Oral Daily  . feeding supplement (PRO-STAT SUGAR FREE 64)  30 mL Oral BID  . furosemide  40 mg Oral BID  . gabapentin  100 mg Oral TID  . insulin aspart  0-15 Units Subcutaneous TID WC  . insulin aspart  0-5 Units Subcutaneous QHS  . insulin aspart  5 Units Subcutaneous TID WC  . insulin glargine  32 Units Subcutaneous QHS  . megestrol  200 mg Oral BID  . metoprolol tartrate  12.5 mg Oral BID  . multivitamin with minerals   1 tablet Oral Daily  . nystatin   Topical TID  . pantoprazole  40 mg Oral Q12H  . Ensure Max Protein  11 oz Oral TID  . sodium chloride  2 g Oral BID WC  . sucralfate  1 g Oral Q6H   Continuous Infusions: . sodium chloride 250 mL/hr at 03/08/20 0957     LOS: 27 days   Total time spent 25 minutes Darliss Cheney, MD [facility] Pager On Amion  If 7PM-7AM, please  contact night-coverage 03/26/2020, 1:09 PM

## 2020-03-27 DIAGNOSIS — Z515 Encounter for palliative care: Secondary | ICD-10-CM

## 2020-03-27 LAB — GLUCOSE, CAPILLARY
Glucose-Capillary: 82 mg/dL (ref 70–99)
Glucose-Capillary: 141 mg/dL — ABNORMAL HIGH (ref 70–99)
Glucose-Capillary: 131 mg/dL — ABNORMAL HIGH (ref 70–99)
Glucose-Capillary: 100 mg/dL — ABNORMAL HIGH (ref 70–99)

## 2020-03-27 NOTE — Social Work (Signed)
Insurance authorized 5/3-5/7. Authorization id [id number].  Criss Alvine, MSW, [employer]

## 2020-03-27 NOTE — Plan of Care (Signed)
  Problem: Safety: Goal: Ability to remain free from injury will improve Outcome: Progressing   Problem: Skin Integrity: Goal: Risk for impaired skin integrity will decrease Outcome: Progressing   

## 2020-03-27 NOTE — Clinical Social Work Note (Signed)
Call made to Rosario Adie [phone]), admissions director with [facility]. Message left regarding patient having insurance authorization and PASRR pending. CSW will follow-up with Freda Munro on Tuesday regarding patient.  Ziyon Cedotal Givens, MSW, LCSW Licensed Clinical Social Worker [place] [phone]

## 2020-03-27 NOTE — Progress Notes (Signed)
   Patient ID: Yvonne [name], female   DOB: October 01, 1946, 74 y.o.   MRN: [id number]   This NP reviewed medical records,  discussed patient's case with Wendi/case manager RN with transitions of care team.  Plan is for disposition to SNF for rehab.  Social work awaits  needed authorization.  Recommend outpatient community-based palliative care services at the facility.  Palliative medicine team will sign off at this time.  Reconsult if further needs in the future.  No charge  Wadie Lessen NP  Palliative Medicine Team Team Phone # [phone] Pager [phone]

## 2020-03-27 NOTE — Progress Notes (Signed)
PROGRESS NOTE    Yvonne [name]  [id number] DOB: 1946-03-17 DOA: 02/27/2020 PCP: Tresa Garter, MD    Brief Narrative:  74 year old female with dementia, DM 2, COPD, CHF, hyperlipidemia presented to hospital on 02/27/20 with altered mental status, high blood sugar, hematemesis.  Patient's daughter-in-law found her at home vomiting some blood.  Patient was seen in the ER and was found to have significant electrolyte imbalance with lactic acidosis, hyponatremia hyperkalemia, DKA, UTI, diabetic foot ulcer/cellulitis.  Patient has not been compliant with her insulin regimen, and family has been out of touch for few months-it appears patient has been noncompliant with medication, and also with complicated and fractured family dynamics.  In ER, CT head was negative for acute finding, chest x-ray unremarkable, MRI of the lower extremities no evidence of cellulitis EKG prolonged QTC. Patient was seen by GI underwent barium swallow that showed delayed passage of contrast at the GE junction and distal esophagus radiology concerned about pseudoachalasia and subsequently underwent  EGD 03/05/2020-found to have extensive esophagitis, gastritis and duodenitis.Biopsies were taken. No mass was seen at the GE junction. No dilatation was performed for the subtle mild stricture at GEJ due to the severe esophagitis. Recommendations are for PPI and sucralfate. The patient has been started on a clear liquid diet. The patient's DIL [name] stated that if the patient does not improve her PO intake, that she would be interested in hospice. DIL [name] has stated that both she and [name] are interested in a feeding tube. The patient no longer wants to talk to any of her family members about it and does not want feeding tube.    Assessment & Plan:   Active Problems:   DKA (diabetic ketoacidoses) ([place])   Dysphagia   Hematemesis with nausea  DKA/AGMA/Metabolic alkalosis:Resolved.Suspect secondary to UTI/diabetic  foot wound/noncompliance. Seems stable currently  Type 2 diabetes mellitus poorly controlled, with insulin noncompliance.  Hemoglobin A1c 10.1.  Blood sugar now better.  Currently on lantus to 32 units. educed Premeal regimen to 5 units and continue SSI.    Hematemesis/extensive esophagitis, gastritis and duodenitis/nausea vomiting: Was seen earlier by GI, underwent EGD, will continue on PPI as tolerated. Nausea vomiting resolved.   Anorexia in the setting of uncontrolled diabetes, esophagitis gastritis and dementia-started on Megace, encourage oral intake.  Pt reportedly does not desire to have a feeding tube. Dietitian had been following, recommendation for nutritional supplements  Foot wound diabetic versus vascular: on Lt Grt toe. healing well. Cont wound care. blood culture no growth. Monitor, cont wound care. ABI reviewed. Mild vascular disease on R, normal on L Remains stable currently  Candidiasis on inner thighs and under the breast: Continue on nystatin powder.  Suspected UTI, with abnormal UA on admission treated with IV antibiotics cultures no growth.  Had been continued off abx and stable  Hypokalemia: Resolved.  History of COPD: Remains on room air and stable.  Chronic pain Pt had been continued on Neurontin  Anxiety/insomnia on low-dose as needed Xanax  Acute on chronic diastolic CHF last ejection normal with findings consistent with diastoilc dysfunction.  BLE edema noted on exam but improving. Wt up to 111kg on 03/21/2020 from 102kg at last check.  Doubt accuracy as patient does not seem to be overloaded.  Continue Lasix 40 mg p.o. twice daily.  Hyponatremia in the setting of hyperglycemia, remains low, suspect related to volume overload per above. Improving with lasix  Hypocalcemia:  resolved.   Noncompliance-counselled recently  Morbid obesity with BMI 34:  Will definitely benefit with weight loss and healthy lifestyle but notes at home is feasible in the  current setting.  SVT Brief and self limiting noted recently Currently on low dose metoprolol 12.5mg  bid with hold parameters HR stable at this time  Dementia Unsafe living condition reported by family over phone. Family had noticed gradual decline consistent with worsening dementia. Family is agreeable to short term SNF with plans to have pt move in with son after rehab stay SW following for placement  DVT prophylaxis: SCD's Code Status: DNR Family Communication: No family present at the bedside.   Status is: Inpatient  Remains inpatient appropriate because: Unsafe DC plan   Dispo: The patient is from: Home              Anticipated d/c is to: SNF              Anticipated d/c date is: Uncertain as we are waiting for placement arrangements. passr pending.  However she is medically ready for discharge.              Patient currently is medically ready for discharge   Consultants:   GI  Palliative Care  Procedures:   EGD  Antimicrobials: Anti-infectives (From admission, onward)   Start     Dose/Rate Route Frequency Ordered Stop   02/28/20 0030  cefTRIAXone (ROCEPHIN) 1 g in sodium chloride 0.9 % 100 mL IVPB  Status:  Discontinued     1 g 200 mL/hr over 30 Minutes Intravenous Every 24 hours 02/28/20 0016 03/03/20 1304      Subjective: Seen and examined.  No complaints.  Objective: Vitals:   03/26/20 2054 03/27/20 0444 03/27/20 0541 03/27/20 0839  BP: 136/61  (!) 102/55 (!) 89/54  Pulse: 82  78 87  Resp: 20  18 18   Temp: 100.2 F (37.9 C)  98.9 F (37.2 C) 98.2 F (36.8 C)  TempSrc: Oral  Oral   SpO2: 92%  97% 94%  Weight:  117 kg    Height:        Intake/Output Summary (Last 24 hours) at 03/27/2020 1224 Last data filed at 03/27/2020 [id number] Gross per 24 hour  Intake 620 ml  Output 900 ml  Net -280 ml   Filed Weights   03/25/20 0500 03/26/20 2045 03/27/20 0444  Weight: 112 kg 117 kg 117 kg    Examination:  General exam: Appears calm and comfortable    Respiratory system: Clear to auscultation. Respiratory effort normal. Cardiovascular system: S1 & S2 heard, RRR. No JVD, murmurs, rubs, gallops or clicks. No pedal edema. Gastrointestinal system: Abdomen is nondistended, soft and nontender. No organomegaly or masses felt. Normal bowel sounds heard. Central nervous system: Alert and oriented. No focal neurological deficits. Extremities: Symmetric 5 x 5 power. Skin: Mild and improving erythema in the abdominal folds. Psychiatry: Judgement and insight appear poor. Mood & affect appropriate.   Data Reviewed: I have personally reviewed following labs and imaging studies  CBC: No results for input(s): WBC, NEUTROABS, HGB, HCT, MCV, PLT in the last 168 hours. Basic Metabolic Panel: Recent Labs  Lab 03/21/20 0659 03/22/20 0414 03/23/20 0503 03/24/20 0650 03/25/20 0350  NA 131* 130* 132* 129* 132*  K 4.4 3.1* 3.9 3.5 3.9  CL 99 97* 99 95* 101  CO2 24 16* 25 22 21*  GLUCOSE 151* 156* 161* 102* 81  BUN 28* 32* 34* 28* 28*  CREATININE 0.92 0.80 1.10* 0.73 0.76  CALCIUM 9.4 9.9 9.7 9.1 9.2  MG  --   --  1.7  --   --    GFR: Estimated Creatinine Clearance: 73.8 mL/min (by C-G formula based on SCr of 0.76 mg/dL). Liver Function Tests: Recent Labs  Lab 03/21/20 0659 03/22/20 0414  AST 39 19  ALT 22 19  ALKPHOS 65 74  BILITOT 1.2 0.3  PROT 5.1* 5.9*  ALBUMIN 2.3* 2.6*   No results for input(s): LIPASE, AMYLASE in the last 168 hours. No results for input(s): AMMONIA in the last 168 hours. Coagulation Profile: No results for input(s): INR, PROTIME in the last 168 hours. Cardiac Enzymes: No results for input(s): CKTOTAL, CKMB, CKMBINDEX, TROPONINI in the last 168 hours. BNP (last 3 results) No results for input(s): PROBNP in the last 8760 hours. HbA1C: No results for input(s): HGBA1C in the last 72 hours. CBG: Recent Labs  Lab 03/26/20 1125 03/26/20 1615 03/26/20 2050 03/27/20 0647 03/27/20 1120  GLUCAP 135* 75 117* 82  141*   Lipid Profile: No results for input(s): CHOL, HDL, LDLCALC, TRIG, CHOLHDL, LDLDIRECT in the last 72 hours. Thyroid Function Tests: No results for input(s): TSH, T4TOTAL, FREET4, T3FREE, THYROIDAB in the last 72 hours. Anemia Panel: No results for input(s): VITAMINB12, FOLATE, FERRITIN, TIBC, IRON, RETICCTPCT in the last 72 hours. Sepsis Labs: No results for input(s): PROCALCITON, LATICACIDVEN in the last 168 hours.  No results found for this or any previous visit (from the past 240 hour(s)).   Radiology Studies: No results found.  Scheduled Meds: . collagenase   Topical Daily  . DULoxetine  30 mg Oral Daily  . feeding supplement (PRO-STAT SUGAR FREE 64)  30 mL Oral BID  . furosemide  40 mg Oral BID  . gabapentin  100 mg Oral TID  . insulin aspart  0-15 Units Subcutaneous TID WC  . insulin aspart  0-5 Units Subcutaneous QHS  . insulin aspart  5 Units Subcutaneous TID WC  . insulin glargine  32 Units Subcutaneous QHS  . megestrol  200 mg Oral BID  . metoprolol tartrate  12.5 mg Oral BID  . multivitamin with minerals  1 tablet Oral Daily  . nystatin   Topical TID  . pantoprazole  40 mg Oral Q12H  . Ensure Max Protein  11 oz Oral TID  . sodium chloride  2 g Oral BID WC  . sucralfate  1 g Oral Q6H   Continuous Infusions: . sodium chloride 250 mL/hr at 03/08/20 0957     LOS: 28 days   Total time spent 24 minutes  Darliss Cheney, MD [facility] Pager On Amion  If 7PM-7AM, please contact night-coverage 03/27/2020, 12:24 PM

## 2020-03-28 LAB — GLUCOSE, CAPILLARY
Glucose-Capillary: 109 mg/dL — ABNORMAL HIGH (ref 70–99)
Glucose-Capillary: 159 mg/dL — ABNORMAL HIGH (ref 70–99)
Glucose-Capillary: 117 mg/dL — ABNORMAL HIGH (ref 70–99)
Glucose-Capillary: 148 mg/dL — ABNORMAL HIGH (ref 70–99)

## 2020-03-28 MED ORDER — PRO-STAT SUGAR FREE PO LIQD
30.0000 mL | Freq: Three times a day (TID) | ORAL | Status: DC
  Administered 2020-03-28 – 2020-03-29 (×4): 30 mL via ORAL
  Filled 2020-03-28 (×4): qty 30

## 2020-03-28 MED ORDER — BOOST / RESOURCE BREEZE PO LIQD CUSTOM
1.0000 | Freq: Three times a day (TID) | ORAL | Status: DC
  Administered 2020-03-28 – 2020-03-29 (×4): 1 via ORAL

## 2020-03-28 NOTE — Plan of Care (Signed)
  Problem: Clinical Measurements: Goal: Ability to maintain clinical measurements within normal limits will improve Outcome: Progressing   

## 2020-03-28 NOTE — Progress Notes (Signed)
PROGRESS NOTE    Yvonne [name]  [id number] DOB: 1946/01/06 DOA: 02/27/2020 PCP: Tresa Garter, MD    Brief Narrative:  74 year old female with dementia, DM 2, COPD, CHF, hyperlipidemia presented to hospital on 02/27/20 with altered mental status, high blood sugar, hematemesis.  Patient's daughter-in-law found her at home vomiting some blood.  Patient was seen in the ER and was found to have significant electrolyte imbalance with lactic acidosis, hyponatremia hyperkalemia, DKA, UTI, diabetic foot ulcer/cellulitis.  Patient has not been compliant with her insulin regimen, and family has been out of touch for few months-it appears patient has been noncompliant with medication, and also with complicated and fractured family dynamics.  In ER, CT head was negative for acute finding, chest x-ray unremarkable, MRI of the lower extremities no evidence of cellulitis EKG prolonged QTC. Patient was seen by GI underwent barium swallow that showed delayed passage of contrast at the GE junction and distal esophagus radiology concerned about pseudoachalasia and subsequently underwent  EGD 03/05/2020-found to have extensive esophagitis, gastritis and duodenitis.Biopsies were taken. No mass was seen at the GE junction. No dilatation was performed for the subtle mild stricture at GEJ due to the severe esophagitis. Recommendations are for PPI and sucralfate. The patient has been started on a clear liquid diet. The patient's DIL [name] stated that if the patient does not improve her PO intake, that she would be interested in hospice. DIL [name] has stated that both she and [name] are interested in a feeding tube. The patient no longer wants to talk to any of her family members about it and does not want feeding tube.    Assessment & Plan:   Active Problems:   DKA (diabetic ketoacidoses) ([place])   Dysphagia   Hematemesis with nausea   Palliative care by specialist  DKA/AGMA/Metabolic  alkalosis:Resolved.Suspect secondary to UTI/diabetic foot wound/noncompliance. Seems stable currently  Type 2 diabetes mellitus poorly controlled, with insulin noncompliance.  Hemoglobin A1c 10.1.  Had some hypoglycemia.  Currently on lantus to 32 units. reduced Premeal regimen to 5 units and continue SSI.  Blood sugar have been much better controlled since then.  Hematemesis/extensive esophagitis, gastritis and duodenitis/nausea vomiting: Was seen earlier by GI, underwent EGD, will continue on PPI as tolerated. Nausea vomiting resolved.   Anorexia in the setting of uncontrolled diabetes, esophagitis gastritis and dementia-started on Megace, encourage oral intake.  Pt reportedly does not desire to have a feeding tube. Dietitian had been following, recommendation for nutritional supplements  Foot wound diabetic versus vascular: on Lt Grt toe. healing well. Cont wound care. blood culture no growth. Monitor, cont wound care. ABI reviewed. Mild vascular disease on R, normal on L Remains stable currently  Candidiasis on inner thighs and under the breast: Continue on nystatin powder.  Suspected UTI, with abnormal UA on admission treated with IV antibiotics cultures no growth.  Had been continued off abx and stable  Hypokalemia: Resolved.  History of COPD: Remains on room air and stable.  Chronic pain Pt had been continued on Neurontin  Anxiety/insomnia on low-dose as needed Xanax  Acute on chronic diastolic CHF last ejection normal with findings consistent with diastoilc dysfunction.  BLE edema noted on exam but improving. Wt up to 111kg on 03/21/2020 from 102kg at last check.  Doubt accuracy as patient does not seem to be overloaded.  Continue Lasix 40 mg p.o. twice daily.  Hyponatremia in the setting of hyperglycemia, remains low, suspect related to volume overload per above. Improving with lasix  Hypocalcemia:  resolved.   Noncompliance-counselled recently  Morbid obesity with  BMI 34: Will definitely benefit with weight loss and healthy lifestyle but notes at home is feasible in the current setting.  SVT Brief and self limiting noted recently Currently on low dose metoprolol 12.5mg  bid with hold parameters HR stable at this time  Dementia Unsafe living condition reported by family over phone. Family had noticed gradual decline consistent with worsening dementia. Family is agreeable to short term SNF with plans to have pt move in with son after rehab stay SW following for placement DVT prophylaxis: SCD's Code Status: DNR Family Communication: No family present at the bedside.  Talked to her daughter-in-law couple of days ago.  There has been no update to call family for.  Basically waiting for passr and placement. Status is: Inpatient  Remains inpatient appropriate because: Unsafe DC plan   Dispo: The patient is from: Home              Anticipated d/c is to: SNF              Anticipated d/c date is: Uncertain as we are waiting for placement arrangements. passr pending.  However she is medically ready for discharge.              Patient currently is medically ready for discharge   Consultants:   GI  Palliative Care  Procedures:   EGD  Antimicrobials: Anti-infectives (From admission, onward)   Start     Dose/Rate Route Frequency Ordered Stop   02/28/20 0030  cefTRIAXone (ROCEPHIN) 1 g in sodium chloride 0.9 % 100 mL IVPB  Status:  Discontinued     1 g 200 mL/hr over 30 Minutes Intravenous Every 24 hours 02/28/20 0016 03/03/20 1304      Subjective: Seen and examined.  She remains asymptomatic.  Eating breakfast.  Objective: Vitals:   03/27/20 1619 03/27/20 2050 03/28/20 0546 03/28/20 0918  BP: 120/66 (!) 108/51 (!) 121/59 (!) 145/71  Pulse: 68 74 68 86  Resp: 18 17 18 18   Temp: 98.2 F (36.8 C) 98.9 F (37.2 C) 98.2 F (36.8 C) 99.5 F (37.5 C)  TempSrc: Oral Oral Oral Oral  SpO2: 99% 97% 93% 98%  Weight:      Height:         Intake/Output Summary (Last 24 hours) at 03/28/2020 1407 Last data filed at 03/28/2020 0900 Gross per 24 hour  Intake 720 ml  Output 1350 ml  Net -630 ml   Filed Weights   03/25/20 0500 03/26/20 2045 03/27/20 0444  Weight: 112 kg 117 kg 117 kg    Examination:  General exam: Appears calm and comfortable  Respiratory system: Clear to auscultation. Respiratory effort normal. Cardiovascular system: S1 & S2 heard, RRR. No JVD, murmurs, rubs, gallops or clicks. No pedal edema. Gastrointestinal system: Abdomen is nondistended, soft and nontender. No organomegaly or masses felt. Normal bowel sounds heard. Central nervous system: Alert and oriented. No focal neurological deficits. Extremities: Symmetric 5 x 5 power. Skin: Erythema under abdominal folds Psychiatry: Judgement and insight appear poor. Mood & affect appropriate.    Data Reviewed: I have personally reviewed following labs and imaging studies  CBC: No results for input(s): WBC, NEUTROABS, HGB, HCT, MCV, PLT in the last 168 hours. Basic Metabolic Panel: Recent Labs  Lab 03/22/20 0414 03/23/20 0503 03/24/20 0650 03/25/20 0350  NA 130* 132* 129* 132*  K 3.1* 3.9 3.5 3.9  CL 97* 99 95* 101  CO2 16*  25 22 21*  GLUCOSE 156* 161* 102* 81  BUN 32* 34* 28* 28*  CREATININE 0.80 1.10* 0.73 0.76  CALCIUM 9.9 9.7 9.1 9.2  MG  --  1.7  --   --    GFR: Estimated Creatinine Clearance: 73.8 mL/min (by C-G formula based on SCr of 0.76 mg/dL). Liver Function Tests: Recent Labs  Lab 03/22/20 0414  AST 19  ALT 19  ALKPHOS 74  BILITOT 0.3  PROT 5.9*  ALBUMIN 2.6*   No results for input(s): LIPASE, AMYLASE in the last 168 hours. No results for input(s): AMMONIA in the last 168 hours. Coagulation Profile: No results for input(s): INR, PROTIME in the last 168 hours. Cardiac Enzymes: No results for input(s): CKTOTAL, CKMB, CKMBINDEX, TROPONINI in the last 168 hours. BNP (last 3 results) No results for input(s): PROBNP in the  last 8760 hours. HbA1C: No results for input(s): HGBA1C in the last 72 hours. CBG: Recent Labs  Lab 03/27/20 1120 03/27/20 1617 03/27/20 2052 03/28/20 0637 03/28/20 1151  GLUCAP 141* 131* 100* 109* 159*   Lipid Profile: No results for input(s): CHOL, HDL, LDLCALC, TRIG, CHOLHDL, LDLDIRECT in the last 72 hours. Thyroid Function Tests: No results for input(s): TSH, T4TOTAL, FREET4, T3FREE, THYROIDAB in the last 72 hours. Anemia Panel: No results for input(s): VITAMINB12, FOLATE, FERRITIN, TIBC, IRON, RETICCTPCT in the last 72 hours. Sepsis Labs: No results for input(s): PROCALCITON, LATICACIDVEN in the last 168 hours.  No results found for this or any previous visit (from the past 240 hour(s)).   Radiology Studies: No results found.  Scheduled Meds: . collagenase   Topical Daily  . DULoxetine  30 mg Oral Daily  . feeding supplement (PRO-STAT SUGAR FREE 64)  30 mL Oral BID  . furosemide  40 mg Oral BID  . gabapentin  100 mg Oral TID  . insulin aspart  0-15 Units Subcutaneous TID WC  . insulin aspart  0-5 Units Subcutaneous QHS  . insulin aspart  5 Units Subcutaneous TID WC  . insulin glargine  32 Units Subcutaneous QHS  . megestrol  200 mg Oral BID  . metoprolol tartrate  12.5 mg Oral BID  . multivitamin with minerals  1 tablet Oral Daily  . nystatin   Topical TID  . pantoprazole  40 mg Oral Q12H  . Ensure Max Protein  11 oz Oral TID  . sodium chloride  2 g Oral BID WC  . sucralfate  1 g Oral Q6H   Continuous Infusions: . sodium chloride 250 mL/hr at 03/08/20 0957     LOS: 29 days   Total time spent 25 minutes  Darliss Cheney, MD [facility] Pager On Amion  If 7PM-7AM, please contact night-coverage 03/28/2020, 2:07 PM

## 2020-03-28 NOTE — Progress Notes (Signed)
Physical Therapy Treatment Patient Details Name: Yvonne [name] MRN: [id number] DOB: 06-09-1946 Today's Date: 03/28/2020    History of Present Illness Pt is a 74 yo female s/p UTI, diabetic foot ulcer, DKA and cellulitis. EGD on 4/11 revealing esophagitis, gastritis, and duodenitis. PMHx: dementia, T2DM, COPD, CHF,     PT Comments    Continuing work on functional mobility and activity tolerance;  Notable improvements in transfers and mobility, needing mod assist to stand initially, but improving with multiple reps; Multiple loose BMs -- transferred bed to recliner to [facility], and back to recliner; I'm encouraged by progress, and anticipate good progress with functional mobility at SNF   Follow Up Recommendations  SNF;Supervision/Assistance - 24 hour     Equipment Recommendations  Rolling walker with 5" wheels;3in1 (PT)    Recommendations for Other Services       Precautions / Restrictions Precautions Precautions: Fall Restrictions Weight Bearing Restrictions: No    Mobility  Bed Mobility Overal bed mobility: Needs Assistance Bed Mobility: Supine to Sit     Supine to sit: Mod assist     General bed mobility comments: Pt able to manage B LE off of bed, requires modA to get trunk upright and scoot EOB  Transfers Overall transfer level: Needs assistance Equipment used: Rolling walker (2 wheeled) Transfers: Sit to/from [facility] Sit to Stand: Mod assist;Min assist Stand pivot transfers: Mod assist;+2 physical assistance       General transfer comment: minA +2 to stand from EOB, progressed to modA +2 to pivot to chair due to dec ability to follow commands and dec awareness of fecal incontinence. ModA of 1 to stand from chair x4 and assist from other PT for peri-care  Ambulation/Gait                 Stairs             Wheelchair Mobility    Modified Rankin (Stroke Patients Only)       Balance     Sitting balance-Leahy Scale:  Poor(approaching Fair)       Standing balance-Leahy Scale: Poor Standing balance comment: reliant on external support                            Cognition Arousal/Alertness: Awake/alert Behavior During Therapy: WFL for tasks assessed/performed Overall Cognitive Status: Impaired/Different from baseline Area of Impairment: Orientation;Attention;Memory;Following commands;Safety/judgement;Awareness;Problem solving                 Orientation Level: Disoriented to;Place;Situation Current Attention Level: Sustained Memory: Decreased short-term memory   Safety/Judgement: Decreased awareness of safety;Decreased awareness of deficits   Problem Solving: Slow processing;Decreased initiation;Requires verbal cues        Exercises      General Comments General comments (skin integrity, edema, etc.): Tends to be incontinent of bowel with standing; recommend starting session with a pit stop at the [facility]      Pertinent Vitals/Pain Pain Assessment: Faces Faces Pain Scale: Hurts little more Pain Location: [place] when removed [place] Pain Descriptors / Indicators: Discomfort;Sore;Grimacing Pain Intervention(s): Repositioned    Home Living                      Prior Function            PT Goals (current goals can now be found in the care plan section) Acute Rehab PT Goals Patient Stated Goal: Agreeing to get OOB  PT  Goal Formulation: With patient Time For Goal Achievement: 04/07/20 Potential to Achieve Goals: Fair Progress towards PT goals: Progressing toward goals    Frequency    Min 2X/week      PT Plan Current plan remains appropriate    Co-evaluation              AM-PAC PT "6 Clicks" Mobility   Outcome Measure  Help needed turning from your back to your side while in a flat bed without using bedrails?: A Little Help needed moving from lying on your back to sitting on the side of a flat bed without using bedrails?: A Little Help  needed moving to and from a bed to a chair (including a wheelchair)?: A Lot Help needed standing up from a chair using your arms (e.g., wheelchair or bedside chair)?: A Lot Help needed to walk in hospital room?: A Lot Help needed climbing 3-5 steps with a railing? : Total 6 Click Score: 13    End of Session Equipment Utilized During Treatment: Gait belt Activity Tolerance: Patient tolerated treatment well Patient left: in chair;with chair alarm set;with nursing/sitter in room Nurse Communication: Mobility status PT Visit Diagnosis: Other abnormalities of gait and mobility (R26.89);Muscle weakness (generalized) (M62.81);Pain Pain - Right/Left: Left Pain - part of body: Shoulder     Time: [id number] PT Time Calculation (min) (ACUTE ONLY): 38 min  Charges:  $Therapeutic Activity: 38-52 mins                     Roney Marion, PT  Acute Rehabilitation Services Pager [phone] Office [phone]    Colletta [place] 03/28/2020, 2:04 PM

## 2020-03-28 NOTE — Plan of Care (Signed)
  Problem: Education: Goal: Knowledge of General Education information will improve Description: Including pain rating scale, medication(s)/side effects and non-pharmacologic comfort measures Outcome: Not Progressing   

## 2020-03-28 NOTE — Progress Notes (Signed)
Nutrition Follow-up  DOCUMENTATION CODES:   Not applicable  INTERVENTION:  D/c Ensure Max  Boost Breeze po TID, each supplement provides 250 kcal and 9 grams of protein  47ml Pro-stat TID, each supplement provides 100 kcal and 15 grams of protein  Continue MVI daily  NUTRITION DIAGNOSIS:   Inadequate oral intake related to poor appetite as evidenced by meal completion < 25%.  Progressing.  GOAL:   Patient will meet greater than or equal to 90% of their needs  Progressing.  MONITOR:   PO intake, Supplement acceptance, Weight trends, Diet advancement, Labs, I & O's, Skin  REASON FOR ASSESSMENT:   Consult Assessment of nutrition requirement/status  ASSESSMENT:   Patient with PMH significant for dementia, DM, COPD, CHF, HLD, and chronic pain. Presents this admission with DKA.  4/11- s/p EGD- revealed esophagitis, gastritis, and duodenitis  Pt states appetite is okay but reports not liking the supplements.   Discussed pt with RN who reports pt likes Pro-stat, but would like Boost Breeze instead of Ensure Max (currently receiving TID) due to thoughts that Ensure is causing diarrhea.   PO Intake: 0-100% x last 8 recorded meals (50% average intake)  UOP: 1,326ml x24 hours I/O: +37,165.50ml since admit  Labs: Na 132 (L), CBGs 100-159 Medications reviewed and include: 71ml Pro-stat BID, Lasix, Novolog, Lantus, Megace, MVI, Sodium Chloride, Carafate  Diet Order:   Diet Order            DIET DYS 3 Room service appropriate? Yes with Assist; Fluid consistency: Thin  Diet effective now              EDUCATION NEEDS:   Not appropriate for education at this time  Skin:  Skin Assessment: Skin Integrity Issues: Skin Integrity Issues:: Other (Comment) Other: MASD breast, buttocks, groin; wound L foot  Last BM:  5/4 type 7  Height:   Ht Readings from Last 1 Encounters:  02/28/20 5\' 2"  (1.575 m)    Weight:   Wt Readings from Last 1 Encounters:  03/27/20 117  kg    BMI:  Body mass index is 47.18 kg/m.  Estimated Nutritional Needs:   Kcal:  1700-1900 kcal  Protein:  85-100 grams  Fluid:  >/= 1.7 L/day   Larkin Ina, MS, RD, LDN RD pager number and weekend/on-call pager number located in [place].

## 2020-03-29 LAB — RESPIRATORY PANEL BY RT PCR (FLU A&B, COVID)
Influenza A by PCR: NEGATIVE
Influenza B by PCR: NEGATIVE
SARS Coronavirus 2 by RT PCR: NEGATIVE

## 2020-03-29 LAB — GLUCOSE, CAPILLARY
Glucose-Capillary: 55 mg/dL — ABNORMAL LOW (ref 70–99)
Glucose-Capillary: 95 mg/dL (ref 70–99)
Glucose-Capillary: 87 mg/dL (ref 70–99)
Glucose-Capillary: 90 mg/dL (ref 70–99)
Glucose-Capillary: 160 mg/dL — ABNORMAL HIGH (ref 70–99)

## 2020-03-29 MED ORDER — METOPROLOL TARTRATE 25 MG PO TABS: 12.5000 mg | ORAL_TABLET | Freq: Two times a day (BID) | ORAL | Status: AC

## 2020-03-29 MED ORDER — FUROSEMIDE 40 MG PO TABS: 40.0000 mg | ORAL_TABLET | Freq: Two times a day (BID) | ORAL | 0 refills | Status: AC

## 2020-03-29 MED ORDER — COLLAGENASE 250 UNIT/GM EX OINT: TOPICAL_OINTMENT | Freq: Every day | CUTANEOUS | 0 refills | Status: DC

## 2020-03-29 MED ORDER — PRO-STAT SUGAR FREE PO LIQD: 30.0000 mL | Freq: Three times a day (TID) | ORAL | 0 refills | Status: DC

## 2020-03-29 MED ORDER — NYSTATIN 100000 UNIT/GM EX POWD: Freq: Three times a day (TID) | CUTANEOUS | 0 refills | Status: DC

## 2020-03-29 MED ORDER — MEGESTROL ACETATE 400 MG/10ML PO SUSP: 200.0000 mg | Freq: Two times a day (BID) | ORAL | 0 refills | Status: DC

## 2020-03-29 MED ORDER — ALPRAZOLAM 0.25 MG PO TABS: 0.2500 mg | ORAL_TABLET | Freq: Every day | ORAL | 0 refills | Status: DC | PRN

## 2020-03-29 MED ORDER — INSULIN ASPART 100 UNIT/ML ~~LOC~~ SOLN: 0.0000 [IU] | Freq: Every day | SUBCUTANEOUS | 11 refills | Status: DC

## 2020-03-29 MED ORDER — DULOXETINE HCL 30 MG PO CPEP: 30.0000 mg | ORAL_CAPSULE | Freq: Every day | ORAL | 3 refills | Status: DC

## 2020-03-29 MED ORDER — INSULIN ASPART 100 UNIT/ML ~~LOC~~ SOLN: 5.0000 [IU] | Freq: Three times a day (TID) | SUBCUTANEOUS | 11 refills | Status: DC

## 2020-03-29 MED ORDER — MAGIC MOUTHWASH W/LIDOCAINE: 5.0000 mL | Freq: Three times a day (TID) | ORAL | 0 refills | Status: AC | PRN

## 2020-03-29 MED ORDER — PANTOPRAZOLE SODIUM 40 MG PO TBEC: 40.0000 mg | DELAYED_RELEASE_TABLET | Freq: Two times a day (BID) | ORAL | Status: AC

## 2020-03-29 MED ORDER — GABAPENTIN 100 MG PO CAPS: 100.0000 mg | ORAL_CAPSULE | Freq: Three times a day (TID) | ORAL | Status: DC

## 2020-03-29 MED ORDER — ADULT MULTIVITAMIN W/MINERALS CH: 1.0000 | ORAL_TABLET | Freq: Every day | ORAL | Status: AC

## 2020-03-29 MED ORDER — INSULIN ASPART 100 UNIT/ML ~~LOC~~ SOLN: 0.0000 [IU] | Freq: Three times a day (TID) | SUBCUTANEOUS | 11 refills | Status: DC

## 2020-03-29 MED ORDER — GERHARDT'S BUTT CREAM: TOPICAL_CREAM | CUTANEOUS | Status: DC

## 2020-03-29 NOTE — Progress Notes (Signed)
OT Cancellation Note  Patient Details Name: Yvonne [name] MRN: [id number] DOB: 05-10-1946   Cancelled Treatment:    Reason Eval/Treat Not Completed: Other (comment). Pt agitated and cursing about someone stealing her cigarettes, unaware that she is hospital.   Britt Bottom 03/29/2020, 11:29 AM

## 2020-03-29 NOTE — Progress Notes (Signed)
Patient discharged to [facility] by [facility].Patient's belongings sent with patient. Patient is wearing her silver colored bracelet and yellow ring. Report already called by day shift RN to [facility] Yvonne [name], Wonda Cheng, Therapist, sports

## 2020-03-29 NOTE — TOC Transition Note (Signed)
Transition of Care [facility]) - CM/SW Discharge Note 03/29/20 - Discharged to [facility] via non-emergency ambulance [facility] [place]; [phone]   Patient Details  Name: LULUBELLE SIMCOE MRN: [id number] Date of Birth: 24-May-1946  Transition of Care [facility]) CM/SW Contact:  Sable Feil, LCSW Phone Number: 03/29/2020, 2:48 PM   Clinical Narrative: Patient medically stable for discharge and PASRR number received 5/5 ([id number] F, eff. 03/29/20-06/27/20) . [name] discharging to [facility] and facility admissions director Freda Munro contacted and advised of receipt of PASRR number and patient's readiness for discharge. Daughter-in-law [name] Renstrom - [phone] contacted and informed of discharge. She was upset that her requests were not met, one of which was to be contacted 24 hours piror to discharge so that she could assure her mother would be cleaned and her wounds properly cared for and also being able to review the discharge paperwork. The discharge paperwork was transmitted to Mrs.Joya Gaskins for her review. The patient's  nurse also reviewed the d/c paperwork with daughter-in-law.     Final next level of care: Skilled Nursing Facility([facility]) Barriers to Discharge: Barriers Resolved   Patient Goals and CMS Choice Patient states their goals for this hospitalization and ongoing recovery are:: Family in agreement with ST rehab before patient returns home CMS Medicare.gov Compare Post Acute Care list provided to:: Patient Represenative (must comment)(Family advised re: Medicare.gov for SNF information) Choice offered to / list presented to : Adult Children(Primarily talked with daughter-in-law [name] Riva (wife of son [name]))  Discharge Placement PASRR number recieved: 03/29/20            Patient chooses bed at: [facility] Patient to be transferred to facility by: Non-emergency ambulance Name of family member notified: [name] Haros - daughter-in-law  -  [phone] Patient  and family notified of of transfer: 03/29/20  Discharge Plan and Services In-house Referral: Clinical Social Work(TOC referral)                                  Social Determinants of Health (SDOH) Interventions  No SDOH interventions requested or needed prior to discharge   Readmission Risk Interventions No flowsheet data found.

## 2020-03-29 NOTE — Progress Notes (Signed)
Report given to [facility] at [facility]. All belongings are put together in patient belonging bags. Waiting for [facility] to transport patient.

## 2020-03-29 NOTE — Discharge Summary (Addendum)
Physician Discharge Summary  Yvonne [name] IOE:[id number] DOB: [birth year] DOA: 02/27/2020  PCP: Tresa Garter, MD  Admit date: 02/27/2020 Discharge date: 03/29/2020  Admitted From: Home Discharge disposition: SNF   Recommendations for Outpatient Follow-Up:   1. Dysphagia III diet 2. Titration of insulin 3. Daily weights (last weight in our system is 117 kg) 4. Titrate Lasix 5. BMP 1 week   Discharge Diagnosis:   Active Problems:   DKA (diabetic ketoacidoses) ([place])   Dysphagia   Hematemesis with nausea   Palliative care by specialist    Discharge Condition: Improved.  Diet recommendation: Dysphagia 3 with thin liquids  Wound care: Apply 1/4" thick layer of Santyl to the left foot wound, top with dry dressing. Change daily.  Code status: DNR   History of Present Illness:   Yvonne [name] is a 74 y.o. female with medical history significant of Dementia, DM2, COPD, CHF, HLD, chronic pain who presents with hematemesis, elevated blood sugar and AMS.  Family came with patient, but has not seen or spoken to the patient in the past 6 months.  The patient is alert to voice, oriented to person, but not to place or situation.  She did answer that she has not taken her insulin in "some months" but some of her medications have been filled very recently.  She was found to have an [place] with a concomitant metabolic alkalosis.  Also with a very low K, elevated lactic acid and low Ca.  She had normal hemodynamics.  Low Na.  Given the changes found, discussed with ICU who felt tha this could be managed in the progressive unit.   Presumed course, Yvonne [name] noted that she has not taken her insulin in some time.  She was previously on Jardiance, but this was last filled in 2020.  I believe she developed DKA which led to vomiting, hematemesis in the setting of normal H/H and hemoconcentration likely related to vomiting.  She also has a concomitant abnormal UA which could have been  the inciting factor in the DKA.  Overall, she has a mixed acid base picture that will need fluids and holding of insulin in the short term until potassium can be corrected.   Daughter in law Yvonne [name] was the family member who brought her in.  There is a very complicated and fractured family dynamic.  Yvonne [name]'s branch of the family has not been close with Yvonne [name] for about a year since her husband (youngest son) died.  The oldest son and his wife, Yvonne [name], have not been in contact for at least 6 months.  Yvonne [name] lives with 2 adults who have gotten access to her finances, but have not been taking care of her, which is alleged by Yvonne [name].  Yvonne [name] requests a code access for her mother-in-law so only family can find out about her care.    Hospital Course by Problem:   DKA/AGMA/Metabolic alkalosis: -Resolved.Suspect secondary to UTI/diabetic foot wound/noncompliance  Type 2 diabetes mellitus poorly controlled, with insulin noncompliance.   -Hemoglobin A1c 10.1.   -Had some episodes of hypoglycemia.   -Continue to titrate insulin at skilled nursing facility  Hematemesis/extensive esophagitis, gastritis and duodenitis/nausea vomiting: Was seen earlier by GI, underwent EGD, will continue on PPI BID. Nausea vomiting resolved.   Anorexia in the setting of uncontrolled diabetes, esophagitis gastritis and dementia-started on Megace, encourage oral intake.  Pt reportedly does not desire to have a feeding tube. -nutritional  supplements  Foot wound diabetic versus vascular: on Lt Grt toe. healing well. Cont wound care. blood culture no growth.  Mild vascular disease on R, normal on L  Candidiasis on inner thighs and under the breast: Continue on nystatin powder.  Suspected UTI, with abnormal UA on admission treated with IV antibiotics cultures no growth -resolved  Hypokalemia: Resolved.  History of COPD: Remains on room air and stable.  Chronic  pain Pt had been continued on Neurontin at a lower dose  Anxiety/insomnia on low-dose as needed Xanax  Acute on chronic diastolic CHF last ejection normal with findings consistent with diastoilc dysfunction.   -Continue Lasix 40 mg p.o. twice daily.  Hyponatremia in the setting of hyperglycemia, remains low, suspect related to volume overload per above. Improving with lasix  Hypocalcemia:  resolved.    Morbid obesity  Estimated body mass index is 47.18 kg/m as calculated from the following:   Height as of this encounter: 5' 2"  (1.575 m).   Weight as of this encounter: 117 kg.  SVT Brief and self limiting noted recently Currently on low dose metoprolol 12.9m bid with hold parameters HR stable at this time  Dementia Unsafe living condition reported by family over phone. Family had noticed gradual decline consistent with worsening dementia. Family is agreeable to short term SNF with plans to have pt move in with son after rehab stay  Nutrition Status: Nutrition Problem: Inadequate oral intake Etiology: poor appetite Signs/Symptoms: meal completion < 25% Interventions: Boost Breeze, MVI     Medical Consultants:   GI Palliative care  Discharge Exam:   Vitals:   03/29/20 0427 03/29/20 0755  BP: (!) 127/57 129/66  Pulse: 65 72  Resp: 18 18  Temp: 98.8 F (37.1 C) 98.2 F (36.8 C)  SpO2: 96% 100%   Vitals:   03/28/20 1618 03/28/20 2051 03/29/20 0427 03/29/20 0755  BP: 124/75 140/70 (!) 127/57 129/66  Pulse: 76 77 65 72  Resp: 18 20 18 18   Temp: 97.8 F (36.6 C) 98.3 F (36.8 C) 98.8 F (37.1 C) 98.2 F (36.8 C)  TempSrc: Oral Oral Oral Oral  SpO2: 98% 97% 96% 100%  Weight:  117 kg    Height:        General exam: Appears calm and comfortable.   The results of significant diagnostics from this hospitalization (including imaging, microbiology, ancillary and laboratory) are listed below for reference.     Procedures and Diagnostic Studies:   DG  Abd 1 View  Result Date: 02/28/2020 CLINICAL DATA:  Nausea and vomiting.  Fatigue. EXAM: ABDOMEN - 1 VIEW COMPARISON:  12/02/2009 FINDINGS: Two supine views. No gaseous distension of bowel loops. Mild degradation secondary to patient size. No gross free intraperitoneal air. Hyperattenuation projecting cephalad to the right iliac crest is nonspecific and could be external to the patient. IMPRESSION: No acute findings. Electronically Signed   By: KAbigail MiyamotoM.D.   On: 02/28/2020 15:01   CT Head Wo Contrast  Result Date: 02/27/2020 CLINICAL DATA:  Encephalopathy EXAM: CT HEAD WITHOUT CONTRAST TECHNIQUE: Contiguous axial images were obtained from the base of the skull through the vertex without intravenous contrast. COMPARISON:  02/01/2012 FINDINGS: Brain: There is no mass, hemorrhage or extra-axial collection. The size and configuration of the ventricles and extra-axial CSF spaces are normal. There is hypoattenuation of the white matter, most commonly indicating chronic small vessel disease. Vascular: No abnormal hyperdensity of the major intracranial arteries or dural venous sinuses. No intracranial atherosclerosis. Skull: The visualized  skull base, calvarium and extracranial soft tissues are normal. Sinuses/Orbits: No fluid levels or advanced mucosal thickening of the visualized paranasal sinuses. No mastoid or middle ear effusion. The orbits are normal. IMPRESSION: Chronic small vessel disease without acute intracranial abnormality. Electronically Signed   By: Ulyses Jarred M.D.   On: 02/27/2020 23:29   DG Chest Port 1 View  Result Date: 02/27/2020 CLINICAL DATA:  Altered mental status. Malaise. Hematemesis. EXAM: PORTABLE CHEST 1 VIEW COMPARISON:  Chest CT 01/09/2018 FINDINGS: The cardiomediastinal contours are normal. Atherosclerosis of the thoracic aorta. Pulmonary vasculature is normal. No evidence of pneumomediastinum. No consolidation, pleural effusion, or pneumothorax. No acute osseous abnormalities  are seen. IMPRESSION: No acute abnormality. Aortic Atherosclerosis (ICD10-I70.0). Electronically Signed   By: Keith Rake M.D.   On: 02/27/2020 21:54     Labs:   Basic Metabolic Panel: Recent Labs  Lab 03/23/20 0503 03/23/20 0503 03/24/20 0650 03/25/20 0350  NA 132*  --  129* 132*  K 3.9   < > 3.5 3.9  CL 99  --  95* 101  CO2 25  --  22 21*  GLUCOSE 161*  --  102* 81  BUN 34*  --  28* 28*  CREATININE 1.10*  --  0.73 0.76  CALCIUM 9.7  --  9.1 9.2  MG 1.7  --   --   --    < > = values in this interval not displayed.   GFR Estimated Creatinine Clearance: 73.8 mL/min (by C-G formula based on SCr of 0.76 mg/dL). Liver Function Tests: No results for input(s): AST, ALT, ALKPHOS, BILITOT, PROT, ALBUMIN in the last 168 hours. No results for input(s): LIPASE, AMYLASE in the last 168 hours. No results for input(s): AMMONIA in the last 168 hours. Coagulation profile No results for input(s): INR, PROTIME in the last 168 hours.  CBC: No results for input(s): WBC, NEUTROABS, HGB, HCT, MCV, PLT in the last 168 hours. Cardiac Enzymes: No results for input(s): CKTOTAL, CKMB, CKMBINDEX, TROPONINI in the last 168 hours. BNP: Invalid input(s): POCBNP CBG: Recent Labs  Lab 03/28/20 1151 03/28/20 1620 03/28/20 2052 03/29/20 0630 03/29/20 0649  GLUCAP 159* 117* 148* 55* 95   D-Dimer No results for input(s): DDIMER in the last 72 hours. Hgb A1c No results for input(s): HGBA1C in the last 72 hours. Lipid Profile No results for input(s): CHOL, HDL, LDLCALC, TRIG, CHOLHDL, LDLDIRECT in the last 72 hours. Thyroid function studies No results for input(s): TSH, T4TOTAL, T3FREE, THYROIDAB in the last 72 hours.  Invalid input(s): FREET3 Anemia work up No results for input(s): VITAMINB12, FOLATE, FERRITIN, TIBC, IRON, RETICCTPCT in the last 72 hours. Microbiology No results found for this or any previous visit (from the past 240 hour(s)).   Discharge Instructions:   Discharge  Instructions    Increase activity slowly   Complete by: As directed      Allergies as of 03/29/2020   No Known Allergies     Medication List    STOP taking these medications   acetaminophen-codeine 300-30 MG tablet Commonly known as: TYLENOL #3   Belsomra 20 MG Tabs Generic drug: Suvorexant   Jardiance 25 MG Tabs tablet Generic drug: empagliflozin   metFORMIN 1000 MG tablet Commonly known as: GLUCOPHAGE   omeprazole 40 MG capsule Commonly known as: PRILOSEC   ranitidine 150 MG capsule Commonly known as: ZANTAC     TAKE these medications   albuterol 108 (90 Base) MCG/ACT inhaler Commonly known as: VENTOLIN HFA Inhale 2 puffs  into the lungs every 6 (six) hours as needed for wheezing.   ALPRAZolam 0.25 MG tablet Commonly known as: XANAX Take 1 tablet (0.25 mg total) by mouth daily as needed for anxiety.   collagenase ointment Commonly known as: SANTYL Apply topically daily.   DULoxetine 30 MG capsule Commonly known as: CYMBALTA Take 1 capsule (30 mg total) by mouth daily. What changed:   medication strength  how much to take   feeding supplement (PRO-STAT SUGAR FREE 64) Liqd Take 30 mLs by mouth 3 (three) times daily.   furosemide 40 MG tablet Commonly known as: LASIX Take 1 tablet (40 mg total) by mouth 2 (two) times daily.   gabapentin 100 MG capsule Commonly known as: NEURONTIN Take 1 capsule (100 mg total) by mouth 3 (three) times daily. What changed:   medication strength  how much to take  how to take this  when to take this  additional instructions   Gerhardt's butt cream Crea As needed   glucose monitoring kit monitoring kit 1 each by Does not apply route as needed for other. Test blood sugar 3 times daily   insulin aspart 100 UNIT/ML injection Commonly known as: novoLOG Inject 0-15 Units into the skin 3 (three) times daily with meals.   insulin aspart 100 UNIT/ML injection Commonly known as: novoLOG Inject 0-5 Units into the  skin at bedtime.   insulin aspart 100 UNIT/ML injection Commonly known as: novoLOG Inject 5 Units into the skin 3 (three) times daily with meals.   insulin glargine 100 UNIT/ML injection Commonly known as: Lantus Inject 0.25 mLs (25 Units total) into the skin at bedtime. What changed: how much to take   magic mouthwash w/lidocaine Soln Take 5 mLs by mouth 3 (three) times daily as needed for mouth pain.   megestrol 400 MG/10ML suspension Commonly known as: MEGACE Take 5 mLs (200 mg total) by mouth 2 (two) times daily.   metoprolol tartrate 25 MG tablet Commonly known as: LOPRESSOR Take 0.5 tablets (12.5 mg total) by mouth 2 (two) times daily.   multivitamin with minerals Tabs tablet Take 1 tablet by mouth daily.   nystatin powder Commonly known as: MYCOSTATIN/NYSTOP Apply topically 3 (three) times daily.   pantoprazole 40 MG tablet Commonly known as: PROTONIX Take 1 tablet (40 mg total) by mouth every 12 (twelve) hours.   pravastatin 20 MG tablet Commonly known as: PRAVACHOL Take 1 tablet (20 mg total) by mouth daily.      Contact information for after-discharge care    [facility] .   Service: Skilled Nursing Contact information: [place] [phone]               Time coordinating discharge: 35 min  Signed:  Geradine Girt DO  [facility] 03/29/2020, 9:55 AM

## 2020-03-29 NOTE — Progress Notes (Signed)
Hypoglycemic Event  CBG: 55  Treatment: 8 oz juice/soda  Symptoms: None  Follow-up CBG: Time: [id number] CBG Result:95  Possible Reasons for Event: Medication: lantus  Comments/MD notified:Per hypoglycemia protocol    Essam Lowdermilk Joselita

## 2020-04-03 ENCOUNTER — Encounter (HOSPITAL_COMMUNITY): Payer: Self-pay | Admitting: Emergency Medicine

## 2020-04-03 ENCOUNTER — Emergency Department (HOSPITAL_COMMUNITY)
Admission: EM | Admit: 2020-04-03 | Discharge: 2020-04-03 | Disposition: A | Discharge status: Home / Self Care | Payer: Medicare Other | Attending: Emergency Medicine | Admitting: Emergency Medicine

## 2020-04-03 DIAGNOSIS — R55 Syncope and collapse: Secondary | ICD-10-CM | POA: Diagnosis present

## 2020-04-03 DIAGNOSIS — E119 Type 2 diabetes mellitus without complications: Secondary | ICD-10-CM | POA: Diagnosis not present

## 2020-04-03 DIAGNOSIS — J449 Chronic obstructive pulmonary disease, unspecified: Secondary | ICD-10-CM | POA: Diagnosis not present

## 2020-04-03 DIAGNOSIS — I509 Heart failure, unspecified: Secondary | ICD-10-CM | POA: Insufficient documentation

## 2020-04-03 DIAGNOSIS — Z794 Long term (current) use of insulin: Secondary | ICD-10-CM | POA: Diagnosis not present

## 2020-04-03 DIAGNOSIS — I110 Hypertensive heart disease with heart failure: Secondary | ICD-10-CM | POA: Insufficient documentation

## 2020-04-03 DIAGNOSIS — F17210 Nicotine dependence, cigarettes, uncomplicated: Secondary | ICD-10-CM | POA: Insufficient documentation

## 2020-04-03 DIAGNOSIS — Z79899 Other long term (current) drug therapy: Secondary | ICD-10-CM | POA: Insufficient documentation

## 2020-04-03 LAB — CBC WITH DIFFERENTIAL/PLATELET
Abs Immature Granulocytes: 0.16 10*3/uL — ABNORMAL HIGH (ref 0.00–0.07)
Basophils Absolute: 0.1 10*3/uL (ref 0.0–0.1)
Basophils Relative: 0 %
Eosinophils Absolute: 0.1 10*3/uL (ref 0.0–0.5)
Eosinophils Relative: 0 %
HCT: 35.5 % — ABNORMAL LOW (ref 36.0–46.0)
Hemoglobin: 10.9 g/dL — ABNORMAL LOW (ref 12.0–15.0)
Immature Granulocytes: 1 %
Lymphocytes Relative: 24 %
Lymphs Abs: 3.5 10*3/uL (ref 0.7–4.0)
MCH: 24.5 pg — ABNORMAL LOW (ref 26.0–34.0)
MCHC: 30.7 g/dL (ref 30.0–36.0)
MCV: 80 fL (ref 80.0–100.0)
Monocytes Absolute: 1 10*3/uL (ref 0.1–1.0)
Monocytes Relative: 6 %
Neutro Abs: 10 10*3/uL — ABNORMAL HIGH (ref 1.7–7.7)
Neutrophils Relative %: 69 %
Platelets: 355 10*3/uL (ref 150–400)
RBC: 4.44 MIL/uL (ref 3.87–5.11)
RDW: 17.6 % — ABNORMAL HIGH (ref 11.5–15.5)
WBC: 14.8 10*3/uL — ABNORMAL HIGH (ref 4.0–10.5)
nRBC: 0 % (ref 0.0–0.2)

## 2020-04-03 LAB — BASIC METABOLIC PANEL
Anion gap: 11 (ref 5–15)
BUN: 25 mg/dL — ABNORMAL HIGH (ref 8–23)
CO2: 23 mmol/L (ref 22–32)
Calcium: 9.3 mg/dL (ref 8.9–10.3)
Chloride: 102 mmol/L (ref 98–111)
Creatinine, Ser: 0.78 mg/dL (ref 0.44–1.00)
GFR calc Af Amer: >60 mL/min (ref >60)
GFR calc non Af Amer: >60 mL/min (ref >60)
Glucose, Bld: 225 mg/dL — ABNORMAL HIGH (ref 70–99)
Potassium: 3.5 mmol/L (ref 3.5–5.1)
Sodium: 136 mmol/L (ref 135–145)

## 2020-04-03 NOTE — ED Triage Notes (Signed)
Left ankle right thigh pain from falling out of wheelchair; witnessed; no LOC; no blood thinners;   Had sedative meds today.

## 2020-04-03 NOTE — ED Notes (Signed)
[facility] notified for transport 

## 2020-04-03 NOTE — ED Provider Notes (Signed)
[facility] DEPT Provider Note   CSN: [id number] Arrival date & time: 04/03/20  [place]     History Chief Complaint  Patient presents with  . Fall    Yvonne [name] is a 74 y.o. female.  74 yo F with a chief complaints of an event where she fell out of a wheelchair.  Reported by her nursing facility she slid out and was complaining of ankle and knee pain.  The patient denies this.  States that she thinks she passed out while she was sitting in the wheelchair because she had anything to eat or drink all day.  She now feels better.  She denies any injury from her fall.  Denies any pain to her knee or ankle.  Denies chest pain denies head injury denies neck pain denies back pain denies upper or lower extremity pain.  The history is provided by the patient.  Fall This is a new problem. The current episode started less than 1 hour ago. The problem occurs constantly. The problem has not changed since onset.Pertinent negatives include no chest pain, no abdominal pain, no headaches and no shortness of breath. Nothing aggravates the symptoms. Nothing relieves the symptoms.       Past Medical History:  Diagnosis Date  . Asthma   . Blood transfusion   . Cervicalgia   . CHF (congestive heart failure) ([place])   . Chronic pain syndrome   . COPD (chronic obstructive pulmonary disease) ([place])   . Diabetes mellitus   . Disturbance of skin sensation   . Hyperlipidemia   . Hypertension   . Lumbago   . Olecranon bursitis   . Pain in joint, hand   . Polyneuropathy in diabetes(357.2)     Patient Active Problem List   Diagnosis Date Noted  . Palliative care by specialist   . Dysphagia   . Hematemesis with nausea   . DKA (diabetic ketoacidoses) ([place]) 02/28/2020  . Altered mental status   . Dehydration   . Hypokalemia   . Diabetic neuropathy, painful ([place]) 07/20/2015  . Insomnia due to anxiety and fear 07/20/2015  . Anxiety disorder 10/03/2014  . Preventative  health care 10/03/2014  . Type 2 diabetes mellitus without complication ([place]) 63/87/5643  . Depression 06/23/2014  . Essential hypertension 06/23/2014  . Peripheral neuropathic pain 06/23/2014  . Gastroesophageal reflux disease without esophagitis 06/23/2014  . Dyslipidemia 06/23/2014  . Chronic neck pain 03/09/2012  . Right arm pain 03/09/2012  . Chronic low back pain 03/09/2012  . Right knee pain 03/09/2012  . Joint stiffness of hand 03/09/2012  . Encephalopathy 02/05/2012  . Hypoxia 02/01/2012  . Fever 02/01/2012  . Leukocytosis 02/01/2012  . Bronchitis 02/01/2012  . Confusion 02/01/2012  . Diarrhea 02/01/2012  . COPD (chronic obstructive pulmonary disease) ([place]) 02/01/2012  . CAP (community acquired pneumonia) 02/01/2012  . Diabetes mellitus ([place])     Past Surgical History:  Procedure Laterality Date  . ABDOMINAL HYSTERECTOMY    . BACK SURGERY     from bacteria  . BIOPSY  03/05/2020   Procedure: BIOPSY;  Surgeon: Yetta Flock, MD;  Location: [facility] ENDOSCOPY;  Service: Gastroenterology;;  . ESOPHAGOGASTRODUODENOSCOPY (EGD) WITH PROPOFOL N/A 03/05/2020   Procedure: ESOPHAGOGASTRODUODENOSCOPY (EGD) WITH PROPOFOL;  Surgeon: Yetta Flock, MD;  Location: [facility];  Service: Gastroenterology;  Laterality: N/A;     OB History   No obstetric history on file.     No family history on file.  Social History  Tobacco Use  . Smoking status: Current Some Day Smoker    Packs/day: 0.50    Years: 30.00    Pack years: 15.00    Types: Cigarettes  . Smokeless tobacco: Never Used  . Tobacco comment: was dx with COPD  Substance Use Topics  . Alcohol use: No  . Drug use: No    Home Medications Prior to Admission medications   Medication Sig Start Date End Date Taking? Authorizing Provider  albuterol (PROVENTIL HFA;VENTOLIN HFA) 108 (90 BASE) MCG/ACT inhaler Inhale 2 puffs into the lungs every 6 (six) hours as needed for wheezing. Patient not taking: Reported  on 02/28/2020 07/20/15 11/19/16  Tresa Garter, MD  ALPRAZolam Duanne Moron) 0.25 MG tablet Take 1 tablet (0.25 mg total) by mouth daily as needed for anxiety. 03/29/20   Geradine Girt, DO  Amino Acids-Protein Hydrolys (FEEDING SUPPLEMENT, PRO-STAT SUGAR FREE 64,) LIQD Take 30 mLs by mouth 3 (three) times daily. 03/29/20   Geradine Girt, DO  collagenase (SANTYL) ointment Apply topically daily. 03/29/20   Geradine Girt, DO  DULoxetine (CYMBALTA) 30 MG capsule Take 1 capsule (30 mg total) by mouth daily. 03/29/20   Geradine Girt, DO  furosemide (LASIX) 40 MG tablet Take 1 tablet (40 mg total) by mouth 2 (two) times daily. 03/29/20   Geradine Girt, DO  gabapentin (NEURONTIN) 100 MG capsule Take 1 capsule (100 mg total) by mouth 3 (three) times daily. 03/29/20   Geradine Girt, DO  glucose monitoring kit (FREESTYLE) monitoring kit 1 each by Does not apply route as needed for other. Test blood sugar 3 times daily 06/23/14   Angelica Chessman E, MD  Hydrocortisone (GERHARDT'S BUTT CREAM) CREA As needed 03/29/20   Eulogio Bear U, DO  insulin aspart (NOVOLOG) 100 UNIT/ML injection Inject 0-15 Units into the skin 3 (three) times daily with meals. 03/29/20   Geradine Girt, DO  insulin aspart (NOVOLOG) 100 UNIT/ML injection Inject 0-5 Units into the skin at bedtime. 03/29/20   Geradine Girt, DO  insulin aspart (NOVOLOG) 100 UNIT/ML injection Inject 5 Units into the skin 3 (three) times daily with meals. 03/29/20   Geradine Girt, DO  insulin glargine (LANTUS) 100 UNIT/ML injection Inject 0.25 mLs (25 Units total) into the skin at bedtime. Patient taking differently: Inject 15 Units into the skin at bedtime.  07/20/15   Tresa Garter, MD  magic mouthwash w/lidocaine SOLN Take 5 mLs by mouth 3 (three) times daily as needed for mouth pain. 03/29/20   Geradine Girt, DO  megestrol (MEGACE) 400 MG/10ML suspension Take 5 mLs (200 mg total) by mouth 2 (two) times daily. 03/29/20   Geradine Girt, DO  metoprolol  tartrate (LOPRESSOR) 25 MG tablet Take 0.5 tablets (12.5 mg total) by mouth 2 (two) times daily. 03/29/20   Geradine Girt, DO  Multiple Vitamin (MULTIVITAMIN WITH MINERALS) TABS tablet Take 1 tablet by mouth daily. 03/29/20   Geradine Girt, DO  nystatin (MYCOSTATIN/NYSTOP) powder Apply topically 3 (three) times daily. 03/29/20   Geradine Girt, DO  pantoprazole (PROTONIX) 40 MG tablet Take 1 tablet (40 mg total) by mouth every 12 (twelve) hours. 03/29/20   Geradine Girt, DO  pravastatin (PRAVACHOL) 20 MG tablet Take 1 tablet (20 mg total) by mouth daily. 02/21/15   Tresa Garter, MD    Allergies    Patient has no known allergies.  Review of Systems   Review of Systems  Constitutional: Negative  for chills and fever.  HENT: Negative for congestion and rhinorrhea.   Eyes: Negative for redness and visual disturbance.  Respiratory: Negative for shortness of breath and wheezing.   Cardiovascular: Negative for chest pain and palpitations.  Gastrointestinal: Negative for abdominal pain, nausea and vomiting.  Genitourinary: Negative for dysuria and urgency.  Musculoskeletal: Negative for arthralgias and myalgias.  Skin: Negative for pallor and wound.  Neurological: Positive for syncope. Negative for dizziness and headaches.    Physical Exam Updated Vital Signs BP (!) 143/75 (BP Location: Left Arm)   Pulse 87   Temp 97.9 F (36.6 C) (Oral)   Resp 16   SpO2 100%   Physical Exam Vitals and nursing note reviewed.  Constitutional:      General: She is not in acute distress.    Appearance: She is well-developed. She is not diaphoretic.  HENT:     Head: Normocephalic and atraumatic.  Eyes:     Pupils: Pupils are equal, round, and reactive to light.  Cardiovascular:     Rate and Rhythm: Normal rate and regular rhythm.     Heart sounds: No murmur. No friction rub. No gallop.   Pulmonary:     Effort: Pulmonary effort is normal.     Breath sounds: No wheezing or rales.  Abdominal:      General: There is no distension.     Palpations: Abdomen is soft.     Tenderness: There is no abdominal tenderness.  Musculoskeletal:        General: No tenderness.     Cervical back: Normal range of motion and neck supple.     Comments: Palpated from head to toe without any noted areas of bony tenderness.  He was rotate her head 45 degrees in abduction without pain.  Skin:    General: Skin is warm and dry.  Neurological:     Mental Status: She is alert and oriented to person, place, and time.  Psychiatric:        Behavior: Behavior normal.     ED Results / Procedures / Treatments   Labs (all labs ordered are listed, but only abnormal results are displayed) Labs Reviewed  CBC WITH DIFFERENTIAL/PLATELET - Abnormal; Notable for the following components:      Result Value   WBC 14.8 (*)    Hemoglobin 10.9 (*)    HCT 35.5 (*)    MCH 24.5 (*)    RDW 17.6 (*)    Neutro Abs 10.0 (*)    Abs Immature Granulocytes 0.16 (*)    All other components within normal limits  BASIC METABOLIC PANEL - Abnormal; Notable for the following components:   Glucose, Bld 225 (*)    BUN 25 (*)    All other components within normal limits    EKG EKG Interpretation  Date/Time:  Monday Apr 03 2020 20:54:51 EDT Ventricular Rate:  84 PR Interval:    QRS Duration: 126 QT Interval:  436 QTC Calculation: 516 R Axis:   -102 Text Interpretation: Sinus rhythm Ventricular premature complex Short PR interval Right bundle branch block Inferior infarct, old Probable lateral infarct, age indeterminate No significant change since last tracing Confirmed by Deno Etienne [phone]) on 04/03/2020 8:56:55 PM   Radiology No results found.  Procedures Procedures (including critical care time)  Medications Ordered in ED Medications - No data to display  ED Course  I have reviewed the triage vital signs and the nursing notes.  Pertinent labs & imaging results that were available during  my care of the patient were  reviewed by me and considered in my medical decision making (see chart for details).    MDM Rules/Calculators/A&P                      74 yo F with a cc of a fall.  The patient had slid out of a chair.  Witnessed by nursing staff.  Patient thinks that they had syncopized. Will obtain basic labs and reassess.   Lab work with a mild leukocytosis but no other concerning finding.  Patient states she feels well.  Discharge home.  8:57 PM:  I have discussed the diagnosis/risks/treatment options with the patient and believe the pt to be eligible for discharge home to follow-up with PCP. We also discussed returning to the ED immediately if new or worsening sx occur. We discussed the sx which are most concerning (e.g., sudden worsening pain, fever, inability to tolerate by mouth) that necessitate immediate return. Medications administered to the patient during their visit and any new prescriptions provided to the patient are listed below.  Medications given during this visit Medications - No data to display   The patient appears reasonably screen and/or stabilized for discharge and I doubt any other medical condition or other [facility] requiring further screening, evaluation, or treatment in the ED at this time prior to discharge.   Final Clinical Impression(s) / ED Diagnoses Final diagnoses:  Syncope and collapse    Rx / DC Orders ED Discharge Orders    None       Deno Etienne, DO 04/03/20 2058

## 2020-06-14 ENCOUNTER — Other Ambulatory Visit: Payer: Self-pay | Admitting: *Deleted

## 2020-06-14 ENCOUNTER — Encounter: Payer: Self-pay | Admitting: *Deleted

## 2020-06-19 ENCOUNTER — Ambulatory Visit (INDEPENDENT_AMBULATORY_CARE_PROVIDER_SITE_OTHER): Payer: Medicare Other | Admitting: Diagnostic Neuroimaging

## 2020-06-19 ENCOUNTER — Encounter: Payer: Self-pay | Admitting: Diagnostic Neuroimaging

## 2020-06-19 VITALS — BP 138/86 | HR 78 | Ht 62.0 in | Wt 204.0 lb

## 2020-06-19 DIAGNOSIS — F0391 Unspecified dementia with behavioral disturbance: Secondary | ICD-10-CM

## 2020-06-19 DIAGNOSIS — F03B18 Unspecified dementia, moderate, with other behavioral disturbance: Secondary | ICD-10-CM

## 2020-06-19 NOTE — Progress Notes (Signed)
[facility]  PATIENT: Yvonne [name] DOB: [birth year]  REFERRING CLINICIAN: Harlan Stains, MD HISTORY FROM: Patient, chart review REASON FOR VISIT: New consult   HISTORICAL  CHIEF COMPLAINT:  Chief Complaint  Patient presents with  . Dementia    rm 7 New Pt, resides at [facility] [place] PRESENT ILLNESS:   74 year old female here for evaluation of dementia.  Patient was living at home until April/May 2021.  She was having increasing problems taking care of her self, managing medications, and ADLs.  She was admitted to the hospital after being found at home disheveled, covered in feces, not having eaten in several days.  She was found by her daughter-in-law who brought her to the hospital.  Apparently other family members had been living with patient but not providing care.  Since that time patient has been diagnosed with dementia.  She is living at [facility] memory care center.  She is a fall risk.  She fell 3 days ago at the facility.  She has some bruising around her eyes.  Today she is in good spirits.  She is pleasant, talkative, friendly.   REVIEW OF SYSTEMS: Full 14 system review of systems performed and negative with exception of: As per HPI.  ALLERGIES: Allergies  Allergen Reactions  . Clonazepam     Unsteady gait, ineffective  . Trazodone And Nefazodone     Unsteady gait, disoriented    HOME MEDICATIONS: Outpatient Medications Prior to Visit  Medication Sig Dispense Refill  . albuterol (PROVENTIL HFA;VENTOLIN HFA) 108 (90 BASE) MCG/ACT inhaler Inhale 2 puffs into the lungs every 6 (six) hours as needed for wheezing. (Patient not taking: Reported on 02/28/2020) 1 Inhaler 3  . ALPRAZolam (XANAX) 0.25 MG tablet Take 1 tablet (0.25 mg total) by mouth daily as needed for anxiety. 3 tablet 0  . Amino Acids-Protein Hydrolys (FEEDING SUPPLEMENT, PRO-STAT SUGAR FREE 64,) LIQD Take 30 mLs by mouth 3 (three) times daily. 887 mL 0  .  BELSOMRA 20 MG TABS Take 1 tablet by mouth at bedtime as needed.    . collagenase (SANTYL) ointment Apply topically daily. 15 g 0  . DULoxetine (CYMBALTA) 60 MG capsule Take 60 mg by mouth daily.    . furosemide (LASIX) 40 MG tablet Take 1 tablet (40 mg total) by mouth 2 (two) times daily. 30 tablet 0  . gabapentin (NEURONTIN) 400 MG capsule 400 mg in the morning, at noon, in the evening, and at bedtime.    Marland Kitchen glucose monitoring kit (FREESTYLE) monitoring kit 1 each by Does not apply route as needed for other. Test blood sugar 3 times daily 1 each 1  . Hydrocortisone (GERHARDT'S BUTT CREAM) CREA As needed    . insulin aspart (NOVOLOG) 100 UNIT/ML injection Inject 0-15 Units into the skin 3 (three) times daily with meals. 10 mL 11  . insulin aspart (NOVOLOG) 100 UNIT/ML injection Inject 0-5 Units into the skin at bedtime. 10 mL 11  . insulin aspart (NOVOLOG) 100 UNIT/ML injection Inject 5 Units into the skin 3 (three) times daily with meals. 10 mL 11  . insulin glargine (LANTUS) 100 UNIT/ML injection Inject 0.25 mLs (25 Units total) into the skin at bedtime. (Patient taking differently: Inject 15 Units into the skin at bedtime. ) 30 mL 3  . magic mouthwash w/lidocaine SOLN Take 5 mLs by mouth 3 (three) times daily as needed for mouth pain.  0  . megestrol (MEGACE) 400 MG/10ML suspension  Take 5 mLs (200 mg total) by mouth 2 (two) times daily. 240 mL 0  . metoprolol tartrate (LOPRESSOR) 25 MG tablet Take 0.5 tablets (12.5 mg total) by mouth 2 (two) times daily.    . Multiple Vitamin (MULTIVITAMIN WITH MINERALS) TABS tablet Take 1 tablet by mouth daily.    Marland Kitchen nystatin (MYCOSTATIN/NYSTOP) powder Apply topically 3 (three) times daily. 15 g 0  . omeprazole (PRILOSEC) 40 MG capsule Take 40 mg by mouth daily.    . pantoprazole (PROTONIX) 40 MG tablet Take 1 tablet (40 mg total) by mouth every 12 (twelve) hours.    . pravastatin (PRAVACHOL) 20 MG tablet Take 1 tablet (20 mg total) by mouth daily. 90 tablet 3    No facility-administered medications prior to visit.    PAST MEDICAL HISTORY: Past Medical History:  Diagnosis Date  . Asthma   . Blood transfusion   . Cervicalgia   . CHF (congestive heart failure) ([place])   . Chronic pain syndrome   . CKD (chronic kidney disease)   . COPD (chronic obstructive pulmonary disease) ([place])   . Dementia ([place])   . Diabetes mellitus   . Disturbance of skin sensation   . GERD (gastroesophageal reflux disease)   . Hyperlipidemia   . Hypertension   . Insomnia   . Lumbago   . Olecranon bursitis   . Pain in joint, hand   . Polyneuropathy in diabetes(357.2)     PAST SURGICAL HISTORY: Past Surgical History:  Procedure Laterality Date  . ABDOMINAL HYSTERECTOMY  1970's  . BACK SURGERY     from bacteria  . BIOPSY  03/05/2020   Procedure: BIOPSY;  Surgeon: Yetta Flock, MD;  Location: [facility] ENDOSCOPY;  Service: Gastroenterology;;  . CATARACT EXTRACTION    . ESOPHAGOGASTRODUODENOSCOPY (EGD) WITH PROPOFOL N/A 03/05/2020   Procedure: ESOPHAGOGASTRODUODENOSCOPY (EGD) WITH PROPOFOL;  Surgeon: Yetta Flock, MD;  Location: [facility];  Service: Gastroenterology;  Laterality: N/A;  . SHOULDER ARTHROSCOPY Right     FAMILY HISTORY: No family history on file.  SOCIAL HISTORY: Social History   Socioeconomic History  . Marital status: Widowed    Spouse name: Not on file  . Number of children: 3  . Years of education: 40  . Highest education level: Not on file  Occupational History  . Not on file  Tobacco Use  . Smoking status: Former Smoker    Packs/day: 0.50    Years: 30.00    Pack years: 15.00    Types: Cigarettes  . Smokeless tobacco: Never Used  . Tobacco comment: was dx with COPD  Vaping Use  . Vaping Use: Never used  Substance and Sexual Activity  . Alcohol use: No  . Drug use: No  . Sexual activity: Not on file  Other Topics Concern  . Not on file  Social History Narrative   06/19/20 living at [place] Strain:   . Difficulty of Paying Living Expenses:   Food Insecurity:   . Worried About Charity fundraiser in the Last Year:   . Arboriculturist in the Last Year:   Transportation Needs:   . Film/video editor (Medical):   Marland Kitchen Lack of Transportation (Non-Medical):   Physical Activity:   . Days of Exercise per Week:   . Minutes of Exercise per Session:   Stress:   . Feeling of Stress :   Social Connections:   . Frequency  of Communication with Friends and Family:   . Frequency of Social Gatherings with Friends and Family:   . Attends Religious Services:   . Active Member of Clubs or Organizations:   . Attends Archivist Meetings:   Marland Kitchen Marital Status:   Intimate Partner Violence:   . Fear of Current or Ex-Partner:   . Emotionally Abused:   Marland Kitchen Physically Abused:   . Sexually Abused:      PHYSICAL EXAM  GENERAL EXAM/CONSTITUTIONAL: Vitals:  Vitals:   06/19/20 1041  BP: (!) 138/86  Pulse: 78  Weight: (!) 204 lb (92.5 kg)  Height: 5' 2"  (1.575 m)     Body mass index is 37.31 kg/m. Wt Readings from Last 3 Encounters:  06/19/20 (!) 204 lb (92.5 kg)  03/29/20 260 lb 2.3 oz (118 kg)  12/29/17 227 lb (103 kg)     Patient is in no distress; well developed, nourished and groomed; neck is supple  CARDIOVASCULAR:  Examination of carotid arteries is normal; no carotid bruits  Regular rate and rhythm, no murmurs  Examination of peripheral vascular system by observation and palpation is normal  EYES:  Ophthalmoscopic exam of optic discs and posterior segments is normal; no papilledema or hemorrhages  No exam data present  MUSCULOSKELETAL:  Gait, strength, tone, movements noted in Neurologic exam below  NEUROLOGIC: MENTAL STATUS:  MMSE - [place] Exam 06/19/2020  Orientation to time 1  Orientation to Place 2  Registration 3  Attention/ Calculation 0  Recall 0  Language- name 2 objects 2   Language- repeat 0  Language- follow 3 step command 3  Language- read & follow direction 1  Write a sentence 0  Write a sentence-comments unable  Copy design 0  Total score 12    awake, alert, oriented to person  [place]; [place] attention and concentration  language fluent, comprehension intact; SLIGHT DIFF WITH NAMING  fund of knowledge appropriate  MILD APRAXIA  CRANIAL NERVE:   2nd - no papilledema on fundoscopic exam  2nd, 3rd, 4th, 6th - pupils equal and reactive to light, visual fields full to confrontation, extraocular muscles intact, no nystagmus  5th - facial sensation symmetric  7th - facial strength symmetric  8th - hearing intact  9th - palate elevates symmetrically, uvula midline  11th - shoulder shrug symmetric  12th - tongue protrusion midline  MOTOR:   normal bulk and tone, BUE 4; BLE 3  SENSORY:   normal and symmetric to light touch, temperature, vibration  COORDINATION:   finger-nose-finger, fine finger movements SLOW  REFLEXES:   deep tendon reflexes TRACE and symmetric  GAIT/STATION:   IN WHEELCHAIR    DIAGNOSTIC DATA (LABS, IMAGING, TESTING) - I reviewed patient records, labs, notes, testing and imaging myself where available.  Lab Results  Component Value Date   WBC 14.8 (H) 04/03/2020   HGB 10.9 (L) 04/03/2020   HCT 35.5 (L) 04/03/2020   MCV 80.0 04/03/2020   PLT 355 04/03/2020      Component Value Date/Time   NA 136 04/03/2020 2010   K 3.5 04/03/2020 2010   CL 102 04/03/2020 2010   CO2 23 04/03/2020 2010   GLUCOSE 225 (H) 04/03/2020 2010   BUN 25 (H) 04/03/2020 2010   CREATININE 0.78 04/03/2020 2010   CREATININE 1.35 (H) 12/29/2017 1220   CREATININE 0.82 06/23/2014 1648   CALCIUM 9.3 04/03/2020 2010   PROT 5.9 (L) 03/22/2020 0414   ALBUMIN 2.6 (L)  03/22/2020 0414   AST 19 03/22/2020 0414   AST 11 12/29/2017 1220   ALT 19 03/22/2020 0414   ALT 8 12/29/2017 1220   ALKPHOS  74 03/22/2020 0414   BILITOT 0.3 03/22/2020 0414   BILITOT <0.2 (L) 12/29/2017 1220   GFRNONAA >60 04/03/2020 2010   GFRNONAA 38 (L) 12/29/2017 1220   GFRNONAA 73 06/23/2014 1648   GFRAA >60 04/03/2020 2010   GFRAA 44 (L) 12/29/2017 1220   GFRAA 84 06/23/2014 1648   Lab Results  Component Value Date   CHOL 242 (H) 06/23/2014   HDL 35 (L) 06/23/2014   LDLCALC NOT CALC 06/23/2014   TRIG 470 (H) 06/23/2014   CHOLHDL 6.9 06/23/2014   Lab Results  Component Value Date   HGBA1C 10.1 (H) 02/28/2020   Lab Results  Component Value Date   UCJARWPT00 349 01/16/2009   Lab Results  Component Value Date   TSH 0.545 02/28/2020    02/27/20 CT head [I reviewed images myself and agree with interpretation. Moderate chronic small vessel ischemic disease; mild atrophy.  -VRP]  - Chronic small vessel disease without acute intracranial abnormality.    ASSESSMENT AND PLAN  74 y.o. year old female here with:  Dx:  1. Moderate dementia with behavioral disturbance ([place])      PLAN:  MODERATE DEMENTIA WITH BEHAVIORAL CHANGES (MMSE 12/30; decreased insight) - continue supportive care per [facility] - continue duloxetine - may consider memantine, but likely of limited benefit at this time  Return for pending if symptoms worsen or fail to improve, return to PCP.    Penni Bombard, MD 05/05/6434, 39:12 AM Certified in Neurology, Neurophysiology and Neuroimaging  [facility] [address] [place], [place] [address] [phone]

## 2020-06-19 NOTE — Patient Instructions (Signed)
MODERATE DEMENTIA WITH BEHAVIORAL CHANGES (MMSE 12/30; decreased insight) - continue supportive care per [facility]

## 2020-09-19 ENCOUNTER — Other Ambulatory Visit: Payer: Self-pay

## 2020-09-19 ENCOUNTER — Ambulatory Visit (INDEPENDENT_AMBULATORY_CARE_PROVIDER_SITE_OTHER): Payer: Medicare Other | Admitting: Vascular Surgery

## 2020-09-19 ENCOUNTER — Encounter: Payer: Self-pay | Admitting: Vascular Surgery

## 2020-09-19 DIAGNOSIS — I739 Peripheral vascular disease, unspecified: Secondary | ICD-10-CM

## 2020-09-19 NOTE — Progress Notes (Signed)
Patient name: Yvonne [name] MRN: [id number] DOB: Apr 18, 1946 Sex: female  REASON FOR CONSULT: PAD with left foot wound  HPI: Yvonne [name] is a 74 y.o. female, with history of hypertension, hyperlipidemia, diabetes, COPD, CHF that presents for evaluation of PAD and left foot wound.  Patient currently resides in [facility] facility.  She states she is ambulatory.  She has had a wound on the left metatarsal head for the last 4 months that has not healed.  She is unclear about what kind of dressing care is being done at this time.  She denies any previous vascular surgery interventions.  She states her foot hurts her as well.  She she does smoke cigarrettes intermittently.  Past Medical History:  Diagnosis Date   Asthma    Blood transfusion    Cervicalgia    CHF (congestive heart failure) (HCC)    Chronic pain syndrome    CKD (chronic kidney disease)    COPD (chronic obstructive pulmonary disease) (HCC)    Dementia (HCC)    Diabetes mellitus    Disturbance of skin sensation    GERD (gastroesophageal reflux disease)    Hyperlipidemia    Hypertension    Insomnia    Lumbago    Olecranon bursitis    Pain in joint, hand    Polyneuropathy in diabetes(357.2)     Past Surgical History:  Procedure Laterality Date   ABDOMINAL HYSTERECTOMY  1970's   BACK SURGERY     from bacteria   BIOPSY  03/05/2020   Procedure: BIOPSY;  Surgeon: Yetta Flock, MD;  Location: [place];  Service: Gastroenterology;;   CATARACT EXTRACTION     ESOPHAGOGASTRODUODENOSCOPY (EGD) WITH PROPOFOL N/A 03/05/2020   Procedure: ESOPHAGOGASTRODUODENOSCOPY (EGD) WITH PROPOFOL;  Surgeon: Yetta Flock, MD;  Location: [facility];  Service: Gastroenterology;  Laterality: N/A;   SHOULDER ARTHROSCOPY Right     History reviewed. No pertinent family history.  SOCIAL HISTORY: Social History   Socioeconomic History   Marital status: Widowed    Spouse name: Not on file    Number of children: 3   Years of education: 88   Highest education level: Not on file  Occupational History   Not on file  Tobacco Use   Smoking status: Former Smoker    Packs/day: 0.50    Years: 30.00    Pack years: 15.00    Types: Cigarettes   Smokeless tobacco: Never Used   Tobacco comment: was dx with COPD  Vaping Use   Vaping Use: Never used  Substance and Sexual Activity   Alcohol use: No   Drug use: No   Sexual activity: Not on file  Other Topics Concern   Not on file  Social History Narrative   06/19/20 living at [place] Strain:    Difficulty of Paying Living Expenses: Not on file  Food Insecurity:    Worried About Charity fundraiser in the Last Year: Not on file   [employer] of Food in the Last Year: Not on file  Transportation Needs:    Lack of Transportation (Medical): Not on file   Lack of Transportation (Non-Medical): Not on file  Physical Activity:    Days of Exercise per Week: Not on file   Minutes of Exercise per Session: Not on file  Stress:    Feeling of Stress : Not on file  Social Connections:    Frequency of  Communication with Friends and Family: Not on file   Frequency of Social Gatherings with Friends and Family: Not on file   Attends Religious Services: Not on file   Active Member of Clubs or Organizations: Not on file   Attends Archivist Meetings: Not on file   Marital Status: Not on file  Intimate Partner Violence:    Fear of Current or Ex-Partner: Not on file   Emotionally Abused: Not on file   Physically Abused: Not on file   Sexually Abused: Not on file    Allergies  Allergen Reactions   Clonazepam     Unsteady gait, ineffective   Trazodone And Nefazodone     Unsteady gait, disoriented    Current Outpatient Medications  Medication Sig Dispense Refill   albuterol (PROVENTIL HFA;VENTOLIN HFA) 108 (90 BASE) MCG/ACT inhaler  Inhale 2 puffs into the lungs every 6 (six) hours as needed for wheezing. (Patient not taking: Reported on 02/28/2020) 1 Inhaler 3   ALPRAZolam (XANAX) 0.25 MG tablet Take 1 tablet (0.25 mg total) by mouth daily as needed for anxiety. (Patient not taking: Reported on 06/19/2020) 3 tablet 0   Amino Acids-Protein Hydrolys (FEEDING SUPPLEMENT, PRO-STAT SUGAR FREE 64,) LIQD Take 30 mLs by mouth 3 (three) times daily. 887 mL 0   BELSOMRA 20 MG TABS Take 1 tablet by mouth at bedtime as needed. (Patient not taking: Reported on 06/19/2020)     collagenase (SANTYL) ointment Apply topically daily. (Patient not taking: Reported on 06/19/2020) 15 g 0   DULoxetine (CYMBALTA) 60 MG capsule Take 60 mg by mouth daily.     furosemide (LASIX) 40 MG tablet Take 1 tablet (40 mg total) by mouth 2 (two) times daily. 30 tablet 0   gabapentin (NEURONTIN) 400 MG capsule 400 mg in the morning, at noon, in the evening, and at bedtime. (Patient not taking: Reported on 06/19/2020)     glucose monitoring kit (FREESTYLE) monitoring kit 1 each by Does not apply route as needed for other. Test blood sugar 3 times daily 1 each 1   Hydrocortisone (GERHARDT'S BUTT CREAM) CREA As needed (Patient not taking: Reported on 06/19/2020)     insulin aspart (NOVOLOG) 100 UNIT/ML injection Inject 0-15 Units into the skin 3 (three) times daily with meals. (Patient not taking: Reported on 06/19/2020) 10 mL 11   insulin aspart (NOVOLOG) 100 UNIT/ML injection Inject 0-5 Units into the skin at bedtime. (Patient not taking: Reported on 06/19/2020) 10 mL 11   insulin aspart (NOVOLOG) 100 UNIT/ML injection Inject 5 Units into the skin 3 (three) times daily with meals. (Patient not taking: Reported on 06/19/2020) 10 mL 11   insulin glargine (LANTUS) 100 UNIT/ML injection Inject 0.25 mLs (25 Units total) into the skin at bedtime. (Patient not taking: Reported on 06/19/2020) 30 mL 3   magic mouthwash w/lidocaine SOLN Take 5 mLs by mouth 3 (three) times  daily as needed for mouth pain. (Patient not taking: Reported on 06/19/2020)  0   megestrol (MEGACE) 400 MG/10ML suspension Take 5 mLs (200 mg total) by mouth 2 (two) times daily. (Patient not taking: Reported on 06/19/2020) 240 mL 0   metoprolol tartrate (LOPRESSOR) 25 MG tablet Take 0.5 tablets (12.5 mg total) by mouth 2 (two) times daily.     Multiple Vitamin (MULTIVITAMIN WITH MINERALS) TABS tablet Take 1 tablet by mouth daily.     nystatin (MYCOSTATIN/NYSTOP) powder Apply topically 3 (three) times daily. (Patient not taking: Reported on 06/19/2020) 15 g 0  pantoprazole (PROTONIX) 40 MG tablet Take 1 tablet (40 mg total) by mouth every 12 (twelve) hours.     pravastatin (PRAVACHOL) 20 MG tablet Take 1 tablet (20 mg total) by mouth daily. 90 tablet 3   No current facility-administered medications for this visit.    REVIEW OF SYSTEMS:  [X]  denotes positive finding, [ ]  denotes negative finding Cardiac  Comments:  Chest pain or chest pressure:    Shortness of breath upon exertion:    Short of breath when lying flat:    Irregular heart rhythm:        Vascular    Pain in calf, thigh, or hip brought on by ambulation:    Pain in feet at night that wakes you up from your sleep:     Blood clot in your veins:    Leg swelling:         Pulmonary    Oxygen at home:    Productive cough:     Wheezing:         Neurologic    Sudden weakness in arms or legs:     Sudden numbness in arms or legs:     Sudden onset of difficulty speaking or slurred speech:    Temporary loss of vision in one eye:     Problems with dizziness:         Gastrointestinal    Blood in stool:     Vomited blood:         Genitourinary    Burning when urinating:     Blood in urine:        Psychiatric    Major depression:         Hematologic    Bleeding problems:    Problems with blood clotting too easily:        Skin    Rashes or ulcers:        Constitutional    Fever or chills:      PHYSICAL  EXAM: Vitals:   09/19/20 0858  BP: 116/80  Pulse: 71  Resp: 16  Temp: 97.8 F (36.6 C)  TempSrc: Temporal  SpO2: 96%  Weight: 211 lb (95.7 kg)  Height: 5' 3"  (1.6 m)    GENERAL: The patient is a well-nourished female, in no acute distress. The vital signs are documented above. CARDIAC: There is a regular rate and rhythm.  VASCULAR:  Palpable femoral pulses both groins Large pannus No palpable pedal pulses Left DP PT monophasic by Doppler Left metatarsal wound is pictured below PULMONARY: No respiratory distress ABDOMEN: Soft and non-tender. MUSCULOSKELETAL: There are no major deformities or cyanosis. NEUROLOGIC: No focal weakness or paresthesias are detected. SKIN: There are no ulcers or rashes noted. PSYCHIATRIC: The patient has a normal affect.      DATA:   Noninvasive report from [facility] documents left SFA occlusion with monophasic runoff  ABIs from 03/14/2020 were 1.04 on the left biphasic at the ankle with a toe pressure of 56  Assessment/Plan:  74 year old female presents with nonhealing left foot wound for the last 4 months likely a diabetic ulcer as pictured above.  Discussed that in the setting of known SFA occlusion with monophasic runoff per most recent noninvasive imaging at [facility] it would be reasonable to perform left leg arteriogram with possible intervention.  We did talk about this being done as an outpatient at [facility] in the Cath Lab.  Risk and benefits were discussed in detail including risk of vessel injury, bleeding,  access site complications etc.  Discussed that at the time we would try to improve her blood flow with endovascular technique and ultimately may require bypass at a later date if no endovascular options.  We will get her scheduled for next week.   Marty Heck, MD [facility] [place] Office: [phone]

## 2020-09-27 ENCOUNTER — Other Ambulatory Visit: Payer: Self-pay

## 2020-09-27 ENCOUNTER — Encounter (HOSPITAL_COMMUNITY)
Admission: RE | Disposition: A | Discharge status: Home / Self Care | Payer: Self-pay | Source: Home / Self Care | Attending: Vascular Surgery

## 2020-09-27 ENCOUNTER — Ambulatory Visit (HOSPITAL_COMMUNITY)
Admission: RE | Admit: 2020-09-27 | Discharge: 2020-09-27 | Disposition: A | Discharge status: Home / Self Care | Payer: Medicare Other | Attending: Vascular Surgery | Admitting: Vascular Surgery

## 2020-09-27 DIAGNOSIS — I509 Heart failure, unspecified: Secondary | ICD-10-CM | POA: Diagnosis not present

## 2020-09-27 DIAGNOSIS — I110 Hypertensive heart disease with heart failure: Secondary | ICD-10-CM | POA: Diagnosis not present

## 2020-09-27 DIAGNOSIS — Z20822 Contact with and (suspected) exposure to covid-19: Secondary | ICD-10-CM | POA: Insufficient documentation

## 2020-09-27 DIAGNOSIS — L97529 Non-pressure chronic ulcer of other part of left foot with unspecified severity: Secondary | ICD-10-CM | POA: Insufficient documentation

## 2020-09-27 DIAGNOSIS — E1151 Type 2 diabetes mellitus with diabetic peripheral angiopathy without gangrene: Secondary | ICD-10-CM | POA: Diagnosis present

## 2020-09-27 DIAGNOSIS — E785 Hyperlipidemia, unspecified: Secondary | ICD-10-CM | POA: Insufficient documentation

## 2020-09-27 DIAGNOSIS — I70244 Atherosclerosis of native arteries of left leg with ulceration of heel and midfoot: Secondary | ICD-10-CM | POA: Diagnosis not present

## 2020-09-27 DIAGNOSIS — J449 Chronic obstructive pulmonary disease, unspecified: Secondary | ICD-10-CM | POA: Diagnosis not present

## 2020-09-27 DIAGNOSIS — I70245 Atherosclerosis of native arteries of left leg with ulceration of other part of foot: Secondary | ICD-10-CM | POA: Diagnosis not present

## 2020-09-27 DIAGNOSIS — F17210 Nicotine dependence, cigarettes, uncomplicated: Secondary | ICD-10-CM | POA: Insufficient documentation

## 2020-09-27 DIAGNOSIS — E11621 Type 2 diabetes mellitus with foot ulcer: Secondary | ICD-10-CM | POA: Diagnosis not present

## 2020-09-27 HISTORY — PX: ABDOMINAL AORTOGRAM W/LOWER EXTREMITY: CATH118223

## 2020-09-27 HISTORY — PX: PERIPHERAL VASCULAR INTERVENTION: CATH118257

## 2020-09-27 LAB — GLUCOSE, CAPILLARY
Glucose-Capillary: 127 mg/dL — ABNORMAL HIGH (ref 70–99)
Glucose-Capillary: 137 mg/dL — ABNORMAL HIGH (ref 70–99)
Glucose-Capillary: 142 mg/dL — ABNORMAL HIGH (ref 70–99)

## 2020-09-27 LAB — SARS CORONAVIRUS 2 BY RT PCR (HOSPITAL ORDER, PERFORMED IN ~~LOC~~ HOSPITAL LAB): SARS Coronavirus 2: NEGATIVE

## 2020-09-27 LAB — POCT I-STAT, CHEM 8
BUN: 32 mg/dL — ABNORMAL HIGH (ref 8–23)
Calcium, Ion: 1.27 mmol/L (ref 1.15–1.40)
Chloride: 100 mmol/L (ref 98–111)
Creatinine, Ser: 0.9 mg/dL (ref 0.44–1.00)
Glucose, Bld: 142 mg/dL — ABNORMAL HIGH (ref 70–99)
HCT: 38 % (ref 36.0–46.0)
Hemoglobin: 12.9 g/dL (ref 12.0–15.0)
Potassium: 4 mmol/L (ref 3.5–5.1)
Sodium: 140 mmol/L (ref 135–145)
TCO2: 29 mmol/L (ref 22–32)

## 2020-09-27 LAB — POCT ACTIVATED CLOTTING TIME: Activated Clotting Time: 230 seconds

## 2020-09-27 SURGERY — ABDOMINAL AORTOGRAM W/LOWER EXTREMITY
Anesthesia: LOCAL

## 2020-09-27 MED ORDER — ONDANSETRON HCL 4 MG/2ML IJ SOLN: 4.0000 mg | Freq: Four times a day (QID) | INTRAMUSCULAR | Status: DC | PRN

## 2020-09-27 MED ORDER — SODIUM CHLORIDE 0.9 % IV SOLN: INTRAVENOUS | Status: DC

## 2020-09-27 MED ORDER — SODIUM CHLORIDE 0.9% FLUSH: 3.0000 mL | INTRAVENOUS | Status: DC | PRN

## 2020-09-27 MED ORDER — CLOPIDOGREL BISULFATE 300 MG PO TABS
ORAL_TABLET | ORAL | Status: DC | PRN
  Administered 2020-09-27: 300 mg via ORAL

## 2020-09-27 MED ORDER — CLOPIDOGREL BISULFATE 300 MG PO TABS
ORAL_TABLET | ORAL | Status: AC
  Filled 2020-09-27: qty 1

## 2020-09-27 MED ORDER — ACETAMINOPHEN 325 MG PO TABS: 650.0000 mg | ORAL_TABLET | ORAL | Status: DC | PRN

## 2020-09-27 MED ORDER — IODIXANOL 320 MG/ML IV SOLN
INTRAVENOUS | Status: DC | PRN
  Administered 2020-09-27: 155 mL

## 2020-09-27 MED ORDER — FENTANYL CITRATE (PF) 100 MCG/2ML IJ SOLN
INTRAMUSCULAR | Status: AC
  Filled 2020-09-27: qty 2

## 2020-09-27 MED ORDER — FENTANYL CITRATE (PF) 100 MCG/2ML IJ SOLN
INTRAMUSCULAR | Status: DC | PRN
  Administered 2020-09-27 (×2): 50 ug via INTRAVENOUS

## 2020-09-27 MED ORDER — HEPARIN SODIUM (PORCINE) 1000 UNIT/ML IJ SOLN
INTRAMUSCULAR | Status: AC
  Filled 2020-09-27: qty 1

## 2020-09-27 MED ORDER — HEPARIN (PORCINE) IN NACL 1000-0.9 UT/500ML-% IV SOLN
INTRAVENOUS | Status: DC | PRN
  Administered 2020-09-27 (×2): 500 mL

## 2020-09-27 MED ORDER — LABETALOL HCL 5 MG/ML IV SOLN: 10.0000 mg | INTRAVENOUS | Status: DC | PRN

## 2020-09-27 MED ORDER — SODIUM CHLORIDE 0.9% FLUSH: 3.0000 mL | Freq: Two times a day (BID) | INTRAVENOUS | Status: DC

## 2020-09-27 MED ORDER — SODIUM CHLORIDE 0.9 % IV SOLN: 250.0000 mL | INTRAVENOUS | Status: DC | PRN

## 2020-09-27 MED ORDER — LIDOCAINE HCL (PF) 1 % IJ SOLN
INTRAMUSCULAR | Status: DC | PRN
  Administered 2020-09-27: 15 mL via INTRADERMAL

## 2020-09-27 MED ORDER — HYDRALAZINE HCL 20 MG/ML IJ SOLN: 5.0000 mg | INTRAMUSCULAR | Status: DC | PRN

## 2020-09-27 MED ORDER — SODIUM CHLORIDE 0.9 % WEIGHT BASED INFUSION: 1.0000 mL/kg/h | INTRAVENOUS | Status: DC

## 2020-09-27 MED ORDER — HEPARIN (PORCINE) IN NACL 1000-0.9 UT/500ML-% IV SOLN
INTRAVENOUS | Status: AC
  Filled 2020-09-27: qty 1000

## 2020-09-27 MED ORDER — HEPARIN SODIUM (PORCINE) 1000 UNIT/ML IJ SOLN
INTRAMUSCULAR | Status: DC | PRN
  Administered 2020-09-27: 10000 [IU] via INTRAVENOUS
  Administered 2020-09-27: 2000 [IU] via INTRAVENOUS

## 2020-09-27 MED ORDER — CLOPIDOGREL BISULFATE 75 MG PO TABS: 75.0000 mg | ORAL_TABLET | Freq: Every day | ORAL | 11 refills | Status: AC

## 2020-09-27 MED ORDER — LIDOCAINE HCL (PF) 1 % IJ SOLN
INTRAMUSCULAR | Status: AC
  Filled 2020-09-27: qty 30

## 2020-09-27 MED ORDER — ASPIRIN EC 81 MG PO TBEC: 81.0000 mg | DELAYED_RELEASE_TABLET | Freq: Every day | ORAL | Status: DC

## 2020-09-27 MED ORDER — CLOPIDOGREL BISULFATE 75 MG PO TABS: 75.0000 mg | ORAL_TABLET | Freq: Every day | ORAL | Status: DC

## 2020-09-27 MED ORDER — CLOPIDOGREL BISULFATE 75 MG PO TABS: 300.0000 mg | ORAL_TABLET | Freq: Once | ORAL | Status: DC

## 2020-09-27 SURGICAL SUPPLY — 27 items
BALLN MUSTANG 4X100X135 (BALLOONS) ×3
BALLN MUSTANG 5X120X135 (BALLOONS) ×3
BALLN STERLING OTW 3X40X150 (BALLOONS) ×3
BALLOON MUSTANG 4X100X135 (BALLOONS) IMPLANT
BALLOON MUSTANG 5X120X135 (BALLOONS) IMPLANT
BALLOON STERLING OTW 3X40X150 (BALLOONS) ×1 IMPLANT
CATH OMNI FLUSH 5F 65CM (CATHETERS) ×2 IMPLANT
CATH QUICKCROSS SUPP .035X90CM (MICROCATHETER) ×1 IMPLANT
DEVICE CLOSURE MYNXGRIP 6/7F (Vascular Products) ×2 IMPLANT
DEVICE TORQUE H2O (MISCELLANEOUS) ×3 IMPLANT
GLIDEWIRE ADV .035X260CM (WIRE) ×1 IMPLANT
KIT ENCORE 26 ADVANTAGE (KITS) ×1 IMPLANT
KIT MICROPUNCTURE NIT STIFF (SHEATH) ×1 IMPLANT
KIT PV (KITS) ×3 IMPLANT
SHEATH FLEX ANSEL ANG 6F 45CM (SHEATH) ×2 IMPLANT
SHEATH PINNACLE 5F 10CM (SHEATH) ×1 IMPLANT
SHEATH PINNACLE 6F 10CM (SHEATH) ×1 IMPLANT
SHEATH PROBE COVER 6X72 (BAG) ×3 IMPLANT
STENT ELUVIA 6X100X130 (Permanent Stent) ×1 IMPLANT
STENT ELUVIA 6X120X130 (Permanent Stent) ×1 IMPLANT
STENT ELUVIA 6X80X130 (Permanent Stent) ×2 IMPLANT
SYR MEDRAD MARK V 150ML (SYRINGE) ×1 IMPLANT
TRANSDUCER W/STOPCOCK (MISCELLANEOUS) ×3 IMPLANT
TRAY PV CATH (CUSTOM PROCEDURE TRAY) ×3 IMPLANT
WIRE BENTSON .035X145CM (WIRE) ×1 IMPLANT
WIRE G V18X300CM (WIRE) ×1 IMPLANT
WIRE ROSEN-J .035X260CM (WIRE) ×1 IMPLANT

## 2020-09-27 NOTE — H&P (Signed)
History and Physical Interval Note:  09/27/2020 10:33 AM  Yvonne [name]  has presented today for surgery, with the diagnosis of pad.  The various methods of treatment have been discussed with the patient and family. After consideration of risks, benefits and other options for treatment, the patient has consented to  Procedure(s): ABDOMINAL AORTOGRAM W/LOWER EXTREMITY (N/A) as a surgical intervention.  The patient's history has been reviewed, patient examined, no change in status, stable for surgery.  I have reviewed the patient's chart and labs.  Questions were answered to the patient's satisfaction.    Aortogram, lower extremity arteriogram - left foot wound.  Yvonne [name]  Patient name: Yvonne [name]  MRN: [id number]        DOB: [birth year]        Sex: female  REASON FOR CONSULT: PAD with left foot wound  HPI: Yvonne [name] is a 74 y.o. female, with history of hypertension, hyperlipidemia, diabetes, COPD, CHF that presents for evaluation of PAD and left foot wound.  Patient currently resides in [facility] facility.  She states she is ambulatory.  She has had a wound on the left metatarsal head for the last 4 months that has not healed.  She is unclear about what kind of dressing care is being done at this time.  She denies any previous vascular surgery interventions.  She states her foot hurts her as well.  She she does smoke cigarrettes intermittently.      Past Medical History:  Diagnosis Date  . Asthma   . Blood transfusion   . Cervicalgia   . CHF (congestive heart failure) ([place])   . Chronic pain syndrome   . CKD (chronic kidney disease)   . COPD (chronic obstructive pulmonary disease) ([place])   . Dementia ([place])   . Diabetes mellitus   . Disturbance of skin sensation   . GERD (gastroesophageal reflux disease)   . Hyperlipidemia   . Hypertension   . Insomnia   . Lumbago   . Olecranon bursitis   . Pain in joint, hand   . Polyneuropathy in  diabetes(357.2)          Past Surgical History:  Procedure Laterality Date  . ABDOMINAL HYSTERECTOMY  1970's  . BACK SURGERY     from bacteria  . BIOPSY  03/05/2020   Procedure: BIOPSY;  Surgeon: Yetta Flock, MD;  Location: [facility] ENDOSCOPY;  Service: Gastroenterology;;  . CATARACT EXTRACTION    . ESOPHAGOGASTRODUODENOSCOPY (EGD) WITH PROPOFOL N/A 03/05/2020   Procedure: ESOPHAGOGASTRODUODENOSCOPY (EGD) WITH PROPOFOL;  Surgeon: Yetta Flock, MD;  Location: [place];  Service: Gastroenterology;  Laterality: N/A;  . SHOULDER ARTHROSCOPY Right     History reviewed. No pertinent family history.  SOCIAL HISTORY: Social History        Socioeconomic History  . Marital status: Widowed    Spouse name: Not on file  . Number of children: 3  . Years of education: 45  . Highest education level: Not on file  Occupational History  . Not on file  Tobacco Use  . Smoking status: Former Smoker    Packs/day: 0.50    Years: 30.00    Pack years: 15.00    Types: Cigarettes  . Smokeless tobacco: Never Used  . Tobacco comment: was dx with COPD  Vaping Use  . Vaping Use: Never used  Substance and Sexual Activity  . Alcohol use: No  . Drug use: No  . Sexual activity: Not on file  Other  Topics Concern  . Not on file  Social History Narrative   06/19/20 living at [facility]    Social Determinants of Health      Financial Resource Strain:   . Difficulty of Paying Living Expenses: Not on file  Food Insecurity:   . Worried About Charity fundraiser in the Last Year: Not on file  . Ran Out of Food in the Last Year: Not on file  Transportation Needs:   . Lack of Transportation (Medical): Not on file  . Lack of Transportation (Non-Medical): Not on file  Physical Activity:   . Days of Exercise per Week: Not on file  . Minutes of Exercise per Session: Not on file  Stress:   . Feeling of Stress : Not on file  Social Connections:   .  Frequency of Communication with Friends and Family: Not on file  . Frequency of Social Gatherings with Friends and Family: Not on file  . Attends Religious Services: Not on file  . Active Member of Clubs or Organizations: Not on file  . Attends Archivist Meetings: Not on file  . Marital Status: Not on file  Intimate Partner Violence:   . Fear of Current or Ex-Partner: Not on file  . Emotionally Abused: Not on file  . Physically Abused: Not on file  . Sexually Abused: Not on file         Allergies  Allergen Reactions  . Clonazepam     Unsteady gait, ineffective  . Trazodone And Nefazodone     Unsteady gait, disoriented          Current Outpatient Medications  Medication Sig Dispense Refill  . albuterol (PROVENTIL HFA;VENTOLIN HFA) 108 (90 BASE) MCG/ACT inhaler Inhale 2 puffs into the lungs every 6 (six) hours as needed for wheezing. (Patient not taking: Reported on 02/28/2020) 1 Inhaler 3  . ALPRAZolam (XANAX) 0.25 MG tablet Take 1 tablet (0.25 mg total) by mouth daily as needed for anxiety. (Patient not taking: Reported on 06/19/2020) 3 tablet 0  . Amino Acids-Protein Hydrolys (FEEDING SUPPLEMENT, PRO-STAT SUGAR FREE 64,) LIQD Take 30 mLs by mouth 3 (three) times daily. 887 mL 0  . BELSOMRA 20 MG TABS Take 1 tablet by mouth at bedtime as needed. (Patient not taking: Reported on 06/19/2020)    . collagenase (SANTYL) ointment Apply topically daily. (Patient not taking: Reported on 06/19/2020) 15 g 0  . DULoxetine (CYMBALTA) 60 MG capsule Take 60 mg by mouth daily.    . furosemide (LASIX) 40 MG tablet Take 1 tablet (40 mg total) by mouth 2 (two) times daily. 30 tablet 0  . gabapentin (NEURONTIN) 400 MG capsule 400 mg in the morning, at noon, in the evening, and at bedtime. (Patient not taking: Reported on 06/19/2020)    . glucose monitoring kit (FREESTYLE) monitoring kit 1 each by Does not apply route as needed for other. Test blood sugar 3 times daily 1 each 1  .  Hydrocortisone (GERHARDT'S BUTT CREAM) CREA As needed (Patient not taking: Reported on 06/19/2020)    . insulin aspart (NOVOLOG) 100 UNIT/ML injection Inject 0-15 Units into the skin 3 (three) times daily with meals. (Patient not taking: Reported on 06/19/2020) 10 mL 11  . insulin aspart (NOVOLOG) 100 UNIT/ML injection Inject 0-5 Units into the skin at bedtime. (Patient not taking: Reported on 06/19/2020) 10 mL 11  . insulin aspart (NOVOLOG) 100 UNIT/ML injection Inject 5 Units into the skin 3 (three) times daily with meals. (  Patient not taking: Reported on 06/19/2020) 10 mL 11  . insulin glargine (LANTUS) 100 UNIT/ML injection Inject 0.25 mLs (25 Units total) into the skin at bedtime. (Patient not taking: Reported on 06/19/2020) 30 mL 3  . magic mouthwash w/lidocaine SOLN Take 5 mLs by mouth 3 (three) times daily as needed for mouth pain. (Patient not taking: Reported on 06/19/2020)  0  . megestrol (MEGACE) 400 MG/10ML suspension Take 5 mLs (200 mg total) by mouth 2 (two) times daily. (Patient not taking: Reported on 06/19/2020) 240 mL 0  . metoprolol tartrate (LOPRESSOR) 25 MG tablet Take 0.5 tablets (12.5 mg total) by mouth 2 (two) times daily.    . Multiple Vitamin (MULTIVITAMIN WITH MINERALS) TABS tablet Take 1 tablet by mouth daily.    Marland Kitchen nystatin (MYCOSTATIN/NYSTOP) powder Apply topically 3 (three) times daily. (Patient not taking: Reported on 06/19/2020) 15 g 0  . pantoprazole (PROTONIX) 40 MG tablet Take 1 tablet (40 mg total) by mouth every 12 (twelve) hours.    . pravastatin (PRAVACHOL) 20 MG tablet Take 1 tablet (20 mg total) by mouth daily. 90 tablet 3   No current facility-administered medications for this visit.    REVIEW OF SYSTEMS:  [X]  denotes positive finding, [ ]  denotes negative finding Cardiac  Comments:  Chest pain or chest pressure:    Shortness of breath upon exertion:    Short of breath when lying flat:    Irregular heart rhythm:        Vascular     Pain in calf, thigh, or hip brought on by ambulation:    Pain in feet at night that wakes you up from your sleep:     Blood clot in your veins:    Leg swelling:         Pulmonary    Oxygen at home:    Productive cough:     Wheezing:         Neurologic    Sudden weakness in arms or legs:     Sudden numbness in arms or legs:     Sudden onset of difficulty speaking or slurred speech:    Temporary loss of vision in one eye:     Problems with dizziness:         Gastrointestinal    Blood in stool:     Vomited blood:         Genitourinary    Burning when urinating:     Blood in urine:        Psychiatric    Major depression:         Hematologic    Bleeding problems:    Problems with blood clotting too easily:        Skin    Rashes or ulcers:        Constitutional    Fever or chills:      PHYSICAL EXAM:    Vitals:   09/19/20 0858  BP: 116/80  Pulse: 71  Resp: 16  Temp: 97.8 F (36.6 C)  TempSrc: Temporal  SpO2: 96%  Weight: 211 lb (95.7 kg)  Height: 5' 3"  (1.6 m)    GENERAL: The patient is a well-nourished female, in no acute distress. The vital signs are documented above. CARDIAC: There is a regular rate and rhythm.  VASCULAR:  Palpable femoral pulses both groins Large pannus No palpable pedal pulses Left DP PT monophasic by Doppler Left metatarsal wound is pictured below PULMONARY: No respiratory distress ABDOMEN: Soft and non-tender. MUSCULOSKELETAL:  There are no major deformities or cyanosis. NEUROLOGIC: No focal weakness or paresthesias are detected. SKIN: There are no ulcers or rashes noted. PSYCHIATRIC: The patient has a normal affect.      DATA:   Noninvasive report from [facility] documents left SFA occlusion with monophasic runoff  ABIs from 03/14/2020 were 1.04 on the left biphasic at the ankle with a toe pressure of  56  Assessment/Plan:  74 year old female presents with nonhealing left foot wound for the last 4 months likely a diabetic ulcer as pictured above.  Discussed that in the setting of known SFA occlusion with monophasic runoff per most recent noninvasive imaging at [facility] it would be reasonable to perform left leg arteriogram with possible intervention.  We did talk about this being done as an outpatient at [facility] in the Cath Lab.  Risk and benefits were discussed in detail including risk of vessel injury, bleeding, access site complications etc.  Discussed that at the time we would try to improve her blood flow with endovascular technique and ultimately may require bypass at a later date if no endovascular options.  We will get her scheduled for next week.   Yvonne Heck, MD [facility] [place] Office: [phone]
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
## 2020-09-27 NOTE — Discharge Instructions (Signed)
Femoral Site Care This sheet gives you information about how to care for yourself after your procedure. Your health care provider may also give you more specific instructions. If you have problems or questions, contact your health care provider. What can I expect after the procedure? After the procedure, it is common to have:  Bruising that usually fades within 1-2 weeks.  Tenderness at the site. Follow these instructions at home: Wound care  Follow instructions from your health care provider about how to take care of your insertion site. Make sure you: ? Wash your hands with soap and water before you change your bandage (dressing). If soap and water are not available, use hand sanitizer. ? Change your dressing as told by your health care provider. ? Leave stitches (sutures), skin glue, or adhesive strips in place. These skin closures may need to stay in place for 2 weeks or longer. If adhesive strip edges start to loosen and curl up, you may trim the loose edges. Do not remove adhesive strips completely unless your health care provider tells you to do that.  Do not take baths, swim, or use a hot tub until your health care provider approves.  You may shower 24-48 hours after the procedure or as told by your health care provider. ? Gently wash the site with plain soap and water. ? Pat the area dry with a clean towel. ? Do not rub the site. This may cause bleeding.  Do not apply powder or lotion to the site. Keep the site clean and dry.  Check your femoral site every day for signs of infection. Check for: ? Redness, swelling, or pain. ? Fluid or blood. ? Warmth. ? Pus or a bad smell. Activity  For the first 2-3 days after your procedure, or as long as directed: ? Avoid climbing stairs as much as possible. ? Do not squat.  Do not lift anything that is heavier than 10 lb (4.5 kg), or the limit that you are told, until your health care provider says that it is safe.  Rest as  directed. ? Avoid sitting for a long time without moving. Get up to take short walks every 1-2 hours.  Do not drive for 24 hours if you were given a medicine to help you relax (sedative). General instructions  Take over-the-counter and prescription medicines only as told by your health care provider.  Keep all follow-up visits as told by your health care provider. This is important. Contact a health care provider if you have:  A fever or chills.  You have redness, swelling, or pain around your insertion site. Get help right away if:  The catheter insertion area swells very fast.  You pass out.  You suddenly start to sweat or your skin gets clammy.  The catheter insertion area is bleeding, and the bleeding does not stop when you hold steady pressure on the area.  The area near or just beyond the catheter insertion site becomes pale, cool, tingly, or numb. These symptoms may represent a serious problem that is an emergency. Do not wait to see if the symptoms will go away. Get medical help right away. Call your local emergency services (911 in the [place] Do not drive yourself to the hospital. Summary  After the procedure, it is common to have bruising that usually fades within 1-2 weeks.  Check your femoral site every day for signs of infection.  Do not lift anything that is heavier than 10 lb (4.5 kg), or the   limit that you are told, until your health care provider says that it is safe. This information is not intended to replace advice given to you by your health care provider. Make sure you discuss any questions you have with your health care provider. Document Revised: 11/24/2017 Document Reviewed: 11/24/2017 Elsevier Patient Education  2020 [employer].    Patient will need dual antiplatelet therapy with 75 mg Plavix daily and 81 mg aspirin daily.  Prescription for Plavix was sent to [facility] and she states this is her pharmacy.  She had 3 stents placed in her  left leg today to improve circulation.  I did try to call the nursing facility and no one answered.  I will arrange follow-up in 1 month for left leg arterial duplex ABIs and wound check in our office.  Would recommend wet-to-dry dressing of the ulcer on the metatarsal head daily to the left foot.  Call with questions or concerns.

## 2020-09-27 NOTE — Op Note (Signed)
Patient name: Yvonne [name] MRN: [id number] DOB: 05/30/1946 Sex: female  09/27/2020 Pre-operative Diagnosis: Nonhealing ulcer of left metatarsal head Post-operative diagnosis:  Same Surgeon:  Marty Heck, MD Procedure Performed: 1.  Ultrasound guided access of right common femoral artery 2.  Aortogram including catheter selection of aorta 3.  Bilateral lower extremity arteriogram with selection of third order branches in the left lower extremity 4.  Left SFA angioplasty with stent placement x3 for chronic total occlusion (6 mm x 120 mm Eluvia, 6 mm x 100 mm Eluvia, and 6 mm x 80 mm Eluvia) 5.  Left peroneal angioplasty (3 mm x 40 mm Sterling) 6.  Mynx closure of the right common femoral artery  Indications: Patient is a 74 year old female who was seen in clinic last week with a likely malperforans ulcer of the left metatarsal head that had been nonhealing.  She had noted left SFA occlusion with abnormal waveforms at the ankle.  She presents today for left lower extremity arteriogram, possible intervention after risk benefits discussed.  Findings:   Aortogram showed no flow-limiting stenosis in the aortoiliac segment.  The left main renal and an accessory right renal were visualized.  Left lower extremity arteriogram showed a patent common femoral with two profunda branches.  She had a short stump of SFA proximally and then a long segment SFA occlusion with distal reconstitution of the SFA just before Hunter's canal.  The above and below-knee popliteal artery were widely patent with two-vessel runoff in the anterior tibial and peroneal artery.  Right lower extremity arteriogram shows a patent common femoral and profunda.  Her SFA was patent except for a focal mid high-grade mid SFA stenosis.  The above and below-knee popliteal artery was patent.  Three-vessel runoff.  Ultimately the left SFA chronic total occlusion was crossed antegrade.  This was predilated with a 4 mm Mustang  and then primarily stented with a 6 mm Eluvia x3 as noted above.  Stents were postdilated with a 5 mm Mustang.  Final injection showed inline flow through the SFA stents with no residual stenosis and widely patent stents.  There did appear to be a lesion in the left peroneal artery after stent placement and this may have been related to atheroembolism after crossing the chronic total occlusion.  This was treated with a 3 mm Sterling baloon angioplasty with complete resolution and preserved two vessel runoff.   Procedure:  The patient was identified in the holding area and taken to room 8.  The patient was then placed supine on the table and prepped and draped in the usual sterile fashion.  A time out was called.  Ultrasound was used to evaluate the right common femoral artery.  It was patent .  A digital ultrasound image was acquired.  A micropuncture needle was used to access the right common femoral artery under ultrasound guidance.  An 018 wire was advanced without resistance and a micropuncture sheath was placed.  The 018 wire was removed and a benson wire was placed.  The micropuncture sheath was exchanged for a 5 french sheath.  An omniflush catheter was advanced over the wire to the level of L-1.  An abdominal angiogram was obtained.  Next, the Omni Flush catheter was pulled down and bilateral lower extremity runoff was obtained.  After evaluating images elected to intervene on the left SFA chronic total occlusion.  A glidewirse advantage was used to cross the aortic bifurcation with a Omni Flush catheter.  I parked my  wire in the profunda to get more purchase.  A 6 French Ansell sheath was placed in the right groin over the aortic bifurcation into the left distal external iliac artery.  Patient was given 100 units/kg heparin.  I then used the Glidewire advantage with 035 quick cross catheter to cross her left SFA chronic total occlusion and reentered into the true lumen in the distal SFA.  We confirmed  with hand-injection we were in the true lumen.    I predilated the chronic total occlusion with a 4 mm Mustang in order to get the stents to track.  My plan was primary stent.  I then deployed 6 mm x 120 mm, 6 mm x 100 mm and 6 mm x 80 mm drug coated Eluvia in the proximal mid to distal SFA.  All this was postdilated with a 5 mm Mustang.  We had excellent inline flow through the SFA stents with no residual stenosis after stent placement.  When I did shoot the runoff there did appear to be a focal lesion in the peroneal proximal to mid segment.  Given the initial runoff was difficult to interpret with her moving, I didn't know if this was potentially related to atheroembolism from crossing her chronic total occlusion.  Ultimately I crossed this with a V 18 wire and used a 3 mm x 40 mm Sterling to nominal pressure for 2 minutes.  There was no residual stenosis with preserved two-vessel runoff anterior tibial and peroneal.  That point in time wires and catheters were removed and exchanged for a short 6 French sheath in the right groin.  Mynx closure device was deployed in the right common femoral artery.  Taken to holding in stable condition.  Plan: Patient will be loaded on Plavix with dual anti-platelet therapy including 81 mg aspirin.  Will need a wet-to-dry dressing or any other dressing care per nursing at her wound care center.  Will arrange follow-up in 1 month.  Marty Heck, MD [facility] Office: [place]

## 2020-09-28 ENCOUNTER — Encounter (HOSPITAL_COMMUNITY): Payer: Self-pay | Admitting: Vascular Surgery

## 2020-10-05 ENCOUNTER — Telehealth: Payer: Self-pay | Admitting: Radiology

## 2020-10-05 ENCOUNTER — Encounter: Payer: Self-pay | Admitting: Orthopedic Surgery

## 2020-10-05 ENCOUNTER — Ambulatory Visit (INDEPENDENT_AMBULATORY_CARE_PROVIDER_SITE_OTHER): Payer: Medicare Other

## 2020-10-05 ENCOUNTER — Ambulatory Visit (INDEPENDENT_AMBULATORY_CARE_PROVIDER_SITE_OTHER): Payer: Medicare Other | Admitting: Orthopedic Surgery

## 2020-10-05 VITALS — Ht 63.0 in | Wt 203.0 lb

## 2020-10-05 DIAGNOSIS — M79672 Pain in left foot: Secondary | ICD-10-CM

## 2020-10-05 DIAGNOSIS — M869 Osteomyelitis, unspecified: Secondary | ICD-10-CM

## 2020-10-05 NOTE — Telephone Encounter (Signed)
Yvonne [name] from the answering service called and states that the patient called in wanting a call back. She states her foot is infected and it is draining and that she wanted someone to call her back to discuss this.

## 2020-10-05 NOTE — Progress Notes (Signed)
Office Visit Note   Patient: Yvonne [name]           Date of Birth: [birth year]           MRN: [id number] Visit Date: 10/05/2020              Requested by: Harlan Stains, MD [address] [place],  [place] [address] PCP: Harlan Stains, MD  Chief Complaint  Patient presents with  . Left Foot - Pain      HPI: Patient is a 74 year old woman with diabetes who was seen for initial evaluation for swelling and ulceration left foot.  Patient complains of swelling and drainage from the ulcer medial aspect of the left great toe.  Patient is a resident at [facility].  Assessment & Plan: Visit Diagnoses:  1. Pain in left foot   2. Osteomyelitis of great toe of left foot ([place])     Plan: Discussed with the patient that she has chronic osteomyelitis and we would need to proceed with a first ray amputation.  Discussed that there is a risk of the wound not healing need for additional surgery.  Patient states she understands wished to proceed I recommend she call her family to let them know of her proposed surgery next week.  Plan for outpatient surgery.  Follow-Up Instructions: Return in about 2 weeks (around 10/19/2020).   Ortho Exam  Patient is alert, oriented, no adenopathy, well-dressed, normal affect, normal respiratory effort. Examination patient has massive swelling I cannot palpate a dorsalis pedis pulse the Doppler was used and she has a strong biphasic dorsalis pedis and posterior tibial pulse.  Patient is ambulating in a wheelchair she has venous stasis swelling with pitting edema of the entire left lower extremity there is no ascending cellulitis there is an ulcer over the medial aspect of the MTP joint and this probes 2 cm deep into the MTP joint.  There is no cellulitis no purulence.  Most recent hemoglobin A1c 10.1  Imaging: XR Foot 2 Views Left  Result Date: 10/05/2020 2 view radiographs of the left foot shows destructive osteomyelitis  involving the entire MTP joint with pathologic fractures through the metatarsal head and proximal phalanx.  No images are attached to the encounter.  Labs: Lab Results  Component Value Date   HGBA1C 10.1 (H) 02/28/2020   HGBA1C 10.30 07/20/2015   HGBA1C 10.30 02/21/2015   REPTSTATUS 02/29/2020 FINAL 02/27/2020   GRAMSTAIN  09/05/2007    MODERATE WBC PRESENT, PREDOMINANTLY PMN RARE SQUAMOUS EPITHELIAL CELLS PRESENT FEW GRAM POSITIVE COCCI IN CHAINS IN CLUSTERS   CULT (A) 02/27/2020    >=100,000 COLONIES/mL LACTOBACILLUS SPECIES Standardized susceptibility testing for this organism is not available. Performed at [facility], [place] [address] [place], [place] [address]      Lab Results  Component Value Date   ALBUMIN 2.6 (L) 03/22/2020   ALBUMIN 2.3 (L) 03/21/2020   ALBUMIN 2.3 (L) 03/20/2020    Lab Results  Component Value Date   MG 1.7 03/23/2020   MG 2.3 03/18/2020   MG 1.5 (L) 03/17/2020   No results found for: VD25OH  No results found for: PREALBUMIN CBC EXTENDED Latest Ref Rng & Units 09/27/2020 04/03/2020 03/13/2020  WBC 4.0 - 10.5 K/uL - 14.8(H) 9.9  RBC 3.87 - 5.11 MIL/uL - 4.44 4.65  HGB 12.0 - 15.0 g/dL 12.9 10.9(L) 11.0(L)  HCT 36 - 46 % 38.0 35.5(L) 33.4(L)  PLT 150 - 400 K/uL -  355 262  NEUTROABS 1.7 - 7.7 K/uL - 10.0(H) -  LYMPHSABS 0.7 - 4.0 K/uL - 3.5 -     Body mass index is 35.96 kg/m.  Orders:  Orders Placed This Encounter  Procedures  . XR Foot 2 Views Left   No orders of the defined types were placed in this encounter.    Procedures: No procedures performed  Clinical Data: No additional findings.  ROS:  All other systems negative, except as noted in the HPI. Review of Systems  Objective: Vital Signs: Ht 5\' 3"  (1.6 m)   Wt 203 lb (92.1 kg)   BMI 35.96 kg/m   Specialty Comments:  No specialty comments available.  PMFS History: Patient Active Problem List   Diagnosis Date Noted  . PAD (peripheral artery disease)  ([place]) 09/19/2020  . Palliative care by specialist   . Dysphagia   . Hematemesis with nausea   . DKA (diabetic ketoacidoses) 02/28/2020  . Altered mental status   . Dehydration   . Hypokalemia   . Diabetic neuropathy, painful ([place]) 07/20/2015  . Insomnia due to anxiety and fear 07/20/2015  . Anxiety disorder 10/03/2014  . Preventative health care 10/03/2014  . Type 2 diabetes mellitus without complication ([place]) 02/58/5277  . Depression 06/23/2014  . Essential hypertension 06/23/2014  . Peripheral neuropathic pain 06/23/2014  . Gastroesophageal reflux disease without esophagitis 06/23/2014  . Dyslipidemia 06/23/2014  . Chronic neck pain 03/09/2012  . Right arm pain 03/09/2012  . Chronic low back pain 03/09/2012  . Right knee pain 03/09/2012  . Joint stiffness of hand 03/09/2012  . Encephalopathy 02/05/2012  . Hypoxia 02/01/2012  . Fever 02/01/2012  . Leukocytosis 02/01/2012  . Bronchitis 02/01/2012  . Confusion 02/01/2012  . Diarrhea 02/01/2012  . COPD (chronic obstructive pulmonary disease) ([place]) 02/01/2012  . CAP (community acquired pneumonia) 02/01/2012  . Diabetes mellitus ([place])    Past Medical History:  Diagnosis Date  . Asthma   . Blood transfusion   . Cervicalgia   . CHF (congestive heart failure) ([place])   . Chronic pain syndrome   . CKD (chronic kidney disease)   . COPD (chronic obstructive pulmonary disease) ([place])   . Dementia ([place])   . Diabetes mellitus   . Disturbance of skin sensation   . GERD (gastroesophageal reflux disease)   . Hyperlipidemia   . Hypertension   . Insomnia   . Lumbago   . Olecranon bursitis   . Pain in joint, hand   . Polyneuropathy in diabetes(357.2)     History reviewed. No pertinent family history.  Past Surgical History:  Procedure Laterality Date  . ABDOMINAL AORTOGRAM W/LOWER EXTREMITY N/A 09/27/2020   Procedure: ABDOMINAL AORTOGRAM W/LOWER EXTREMITY;  Surgeon: Marty Heck, MD;  Location: [facility];   Service: Cardiovascular;  Laterality: N/A;  . ABDOMINAL HYSTERECTOMY  1970's  . BACK SURGERY     from bacteria  . BIOPSY  03/05/2020   Procedure: BIOPSY;  Surgeon: Yetta Flock, MD;  Location: [facility] ENDOSCOPY;  Service: Gastroenterology;;  . CATARACT EXTRACTION    . ESOPHAGOGASTRODUODENOSCOPY (EGD) WITH PROPOFOL N/A 03/05/2020   Procedure: ESOPHAGOGASTRODUODENOSCOPY (EGD) WITH PROPOFOL;  Surgeon: Yetta Flock, MD;  Location: [facility];  Service: Gastroenterology;  Laterality: N/A;  . PERIPHERAL VASCULAR INTERVENTION Left 09/27/2020   Procedure: PERIPHERAL VASCULAR INTERVENTION;  Surgeon: Marty Heck, MD;  Location: [facility];  Service: Cardiovascular;  Laterality: Left;  . SHOULDER ARTHROSCOPY Right    Social History  Occupational History  . Not on file  Tobacco Use  . Smoking status: Former Smoker    Packs/day: 0.50    Years: 30.00    Pack years: 15.00    Types: Cigarettes  . Smokeless tobacco: Never Used  . Tobacco comment: was dx with COPD  Vaping Use  . Vaping Use: Never used  Substance and Sexual Activity  . Alcohol use: No  . Drug use: No  . Sexual activity: Not on file

## 2020-10-05 NOTE — Telephone Encounter (Signed)
Dr. Sharol Given spoke to pt's family this afternoon about the appt today and need for surgery.

## 2020-10-06 ENCOUNTER — Other Ambulatory Visit: Payer: Self-pay

## 2020-10-10 ENCOUNTER — Telehealth: Payer: Self-pay

## 2020-10-10 NOTE — Telephone Encounter (Signed)
Patients mom called she nees instructions on what to do and what not to do before surgery. She stated her mom is on blood thinners - asprin and plavix she stated she stated she needs a order to sent to [facility] discontinuing both medications. Call back:[phone]

## 2020-10-10 NOTE — Telephone Encounter (Signed)
I called and sw pt's daughter to advise that the hospital will call prior to surgery to advise of medications to stop prior to. Most times because this is not considered a high volume blood loss procedure and that most times will not hold blood thinners prior to procedure but the hospital will advise of that protocol the day before. To call with any other questions.

## 2020-10-11 ENCOUNTER — Other Ambulatory Visit: Payer: Self-pay | Admitting: Physician Assistant

## 2020-10-12 ENCOUNTER — Other Ambulatory Visit: Payer: Self-pay

## 2020-10-12 ENCOUNTER — Telehealth: Payer: Self-pay

## 2020-10-12 NOTE — Progress Notes (Addendum)
In preparing to call [facility] , I read a note authored by Yvonne [name], CMA at Dr. Jess Barters office, which says that Yvonne [name] staff will instruct Yvonne [name] regarding which medications to hold. I called Yvonne [name], Dr. Jess Barters scheluder about holding Plavix, I left a voice message. I checed Yvonne [name]'s chart and see that Dr. Carlis Abbott started Plavix and Aspirin on 09/27/20.  I called Dr. Ainsley Spinner office and spoke to Yvonne Blinks, RN`, who reviewed information and told me Plavix can be held - today, to let Dr. Jess Barters office that patient would only be off Plavix for 1 day Prior to surgery. I called [facility] to speak to Yvonne [name]'s nurse to tell her to hold Aspirin and Plavix today (there are anesthesia instructions to hold ASA 7 days prior to surgery , if  Surgeon had not given different instructions. 5 minutes later Yvonne [name] called and said that Dr. Sharol Given said that she does not need to stop Plavix.  I asked Yvonne Caldwell, PA-C to review.  Yvonne [name] has dementia, patient is ambulatory. Yvonne [name], daughter -in-law will be in lobby if you need her.  Yvonne [name], Yvonne [name]'s nurse said that patient will probably not get am medications because she will be leaving before they are due.

## 2020-10-12 NOTE — Telephone Encounter (Signed)
Jan Nurse from [facility] called to ask about patient holding Plavix for tomorrow's procedure with Dr. Sharol Given. Explained that holding it for one day would be okay, but would be ineffective. Instructed her to inform Dr. Sharol Given that patient had only held Plavix for one day.

## 2020-10-12 NOTE — Pre-Procedure Instructions (Addendum)
    Yvonne [name]  10/12/2020    Report to [facility], Main Entrance or Entrance "A" at 7:25 AM   >>>>>>Please send patient's Medication Record with medications administrated documentation. ( this information is required prior to OR. This includes medications that may have been on hold for surgery)<<<<<<   Call this number if you have problems the morning of surgery: [phone]  This is the number for the Pre- Surgical Desk.                For any other questions, please call [phone], Monday - Friday 8 AM - 4 PM.   Remember:  Do not eat after midnight.              Patient may drink clear liquids only until 7:25 AM   Take these medicines the morning of surgery with A SIP OF WATER: DULoxetine (CYMBALTA)     gabapentin (NEURONTIN)     megestrol (MEGACE)     pantoprazole (PROTONIX)      Metoprolol     Plavix     Aspirin  May take if needed:    acetaminophen (TYLENOL)  ALPRAZolam (XANAX)  WHAT DO I DO ABOUT MY DIABETES MEDICATION? Do Not take insulin lispro (HUMALOG) at bedtime 10/12/20 or AM 10/13/20  Give Lantus 12 units at bedtime dose.   STOP taking  Aspirin Products (Goody Powder, Excedrin Migraine), Ibuprofen (Advil), Naproxen (Aleve), Vitamins and Herbal Products (ie Fish Oil).      How to Manage Your Diabetes  Before and After Surgery Check patient's  blood sugar the morning of your surgery when she wakes up and every 2 hours until you get to the Short Stay unit. . If  blood sugar is less than 70 mg/dL, you will need to treat for low blood sugar: o Treat a low blood sugar (less than 70 mg/dL)apple), 4 glucose tablets, OR glucose gel. o  Recheck blood sugar in 15 minutes after treatment (to make sure it is greater than 70 mg/dL). If your blood sugar is not greater than 70 mg/dL on recheck, call [phone]   If CBG is greater than 220 in am call [phone] for instructions.     Patient should have a morning shower with antibiotic soap, wear  clean clothes, brush teeth. No lotions, powders, colognes, deodorant, jewelry.   Patient should not bring any valuables- [facility] is not responsible.

## 2020-10-13 ENCOUNTER — Observation Stay (HOSPITAL_COMMUNITY)
Admission: RE | Admit: 2020-10-13 | Discharge: 2020-10-24 | Disposition: A | Discharge status: Home / Self Care | Payer: Medicare Other | Attending: Orthopedic Surgery | Admitting: Orthopedic Surgery

## 2020-10-13 ENCOUNTER — Encounter (HOSPITAL_COMMUNITY)
Admission: RE | Disposition: A | Discharge status: Home / Self Care | Payer: Self-pay | Source: Home / Self Care | Attending: Orthopedic Surgery

## 2020-10-13 ENCOUNTER — Ambulatory Visit (HOSPITAL_COMMUNITY): Payer: Medicare Other | Admitting: Physician Assistant

## 2020-10-13 ENCOUNTER — Encounter (HOSPITAL_COMMUNITY): Payer: Self-pay | Admitting: Orthopedic Surgery

## 2020-10-13 ENCOUNTER — Other Ambulatory Visit: Payer: Self-pay

## 2020-10-13 DIAGNOSIS — J449 Chronic obstructive pulmonary disease, unspecified: Secondary | ICD-10-CM | POA: Insufficient documentation

## 2020-10-13 DIAGNOSIS — J45909 Unspecified asthma, uncomplicated: Secondary | ICD-10-CM | POA: Diagnosis not present

## 2020-10-13 DIAGNOSIS — I509 Heart failure, unspecified: Secondary | ICD-10-CM | POA: Insufficient documentation

## 2020-10-13 DIAGNOSIS — Z20822 Contact with and (suspected) exposure to covid-19: Secondary | ICD-10-CM | POA: Insufficient documentation

## 2020-10-13 DIAGNOSIS — N189 Chronic kidney disease, unspecified: Secondary | ICD-10-CM | POA: Insufficient documentation

## 2020-10-13 DIAGNOSIS — I1310 Hypertensive heart and chronic kidney disease without heart failure, with stage 1 through stage 4 chronic kidney disease, or unspecified chronic kidney disease: Secondary | ICD-10-CM | POA: Insufficient documentation

## 2020-10-13 DIAGNOSIS — E119 Type 2 diabetes mellitus without complications: Secondary | ICD-10-CM | POA: Insufficient documentation

## 2020-10-13 DIAGNOSIS — M869 Osteomyelitis, unspecified: Secondary | ICD-10-CM | POA: Diagnosis not present

## 2020-10-13 DIAGNOSIS — F0390 Unspecified dementia without behavioral disturbance: Secondary | ICD-10-CM | POA: Diagnosis not present

## 2020-10-13 DIAGNOSIS — M79672 Pain in left foot: Secondary | ICD-10-CM | POA: Diagnosis present

## 2020-10-13 DIAGNOSIS — I739 Peripheral vascular disease, unspecified: Secondary | ICD-10-CM

## 2020-10-13 DIAGNOSIS — M86172 Other acute osteomyelitis, left ankle and foot: Principal | ICD-10-CM | POA: Insufficient documentation

## 2020-10-13 DIAGNOSIS — M86071 Acute hematogenous osteomyelitis, right ankle and foot: Secondary | ICD-10-CM | POA: Diagnosis present

## 2020-10-13 HISTORY — PX: AMPUTATION: SHX166

## 2020-10-13 LAB — URINALYSIS, ROUTINE W REFLEX MICROSCOPIC
Bilirubin Urine: NEGATIVE
Glucose, UA: NEGATIVE mg/dL
Hgb urine dipstick: NEGATIVE
Ketones, ur: NEGATIVE mg/dL
Leukocytes,Ua: NEGATIVE
Nitrite: NEGATIVE
Protein, ur: NEGATIVE mg/dL
Specific Gravity, Urine: 1.006 (ref 1.005–1.030)
pH: 7 (ref 5.0–8.0)

## 2020-10-13 LAB — BASIC METABOLIC PANEL
Anion gap: 11 (ref 5–15)
BUN: 26 mg/dL — ABNORMAL HIGH (ref 8–23)
CO2: 25 mmol/L (ref 22–32)
Calcium: 9.7 mg/dL (ref 8.9–10.3)
Chloride: 103 mmol/L (ref 98–111)
Creatinine, Ser: 0.95 mg/dL (ref 0.44–1.00)
GFR, Estimated: >60 mL/min (ref >60)
Glucose, Bld: 127 mg/dL — ABNORMAL HIGH (ref 70–99)
Potassium: 3.7 mmol/L (ref 3.5–5.1)
Sodium: 139 mmol/L (ref 135–145)

## 2020-10-13 LAB — CBC
HCT: 35 % — ABNORMAL LOW (ref 36.0–46.0)
Hemoglobin: 10.7 g/dL — ABNORMAL LOW (ref 12.0–15.0)
MCH: 21.5 pg — ABNORMAL LOW (ref 26.0–34.0)
MCHC: 30.6 g/dL (ref 30.0–36.0)
MCV: 70.4 fL — ABNORMAL LOW (ref 80.0–100.0)
Platelets: 441 10*3/uL — ABNORMAL HIGH (ref 150–400)
RBC: 4.97 MIL/uL (ref 3.87–5.11)
RDW: 14.4 % (ref 11.5–15.5)
WBC: 12.3 10*3/uL — ABNORMAL HIGH (ref 4.0–10.5)
nRBC: 0 % (ref 0.0–0.2)

## 2020-10-13 LAB — GLUCOSE, CAPILLARY
Glucose-Capillary: 114 mg/dL — ABNORMAL HIGH (ref 70–99)
Glucose-Capillary: 122 mg/dL — ABNORMAL HIGH (ref 70–99)
Glucose-Capillary: 128 mg/dL — ABNORMAL HIGH (ref 70–99)
Glucose-Capillary: 117 mg/dL — ABNORMAL HIGH (ref 70–99)
Glucose-Capillary: 186 mg/dL — ABNORMAL HIGH (ref 70–99)

## 2020-10-13 LAB — HEMOGLOBIN A1C
Hgb A1c MFr Bld: 8.1 % — ABNORMAL HIGH (ref 4.8–5.6)
Mean Plasma Glucose: 185.77 mg/dL

## 2020-10-13 LAB — SARS CORONAVIRUS 2 BY RT PCR (HOSPITAL ORDER, PERFORMED IN ~~LOC~~ HOSPITAL LAB): SARS Coronavirus 2: NEGATIVE

## 2020-10-13 SURGERY — AMPUTATION, FOOT, RAY
Anesthesia: Regional | Laterality: Left

## 2020-10-13 MED ORDER — ACETAMINOPHEN 325 MG PO TABS
325.0000 mg | ORAL_TABLET | Freq: Four times a day (QID) | ORAL | Status: DC | PRN
  Administered 2020-10-15: 650 mg via ORAL
  Filled 2020-10-13: qty 2

## 2020-10-13 MED ORDER — METOPROLOL TARTRATE 12.5 MG HALF TABLET
12.5000 mg | ORAL_TABLET | Freq: Once | ORAL | Status: AC
  Administered 2020-10-13: 12.5 mg via ORAL
  Filled 2020-10-13: qty 1

## 2020-10-13 MED ORDER — ONDANSETRON HCL 4 MG PO TABS: 4.0000 mg | ORAL_TABLET | Freq: Four times a day (QID) | ORAL | Status: DC | PRN

## 2020-10-13 MED ORDER — ORAL CARE MOUTH RINSE: 15.0000 mL | Freq: Once | OROMUCOSAL | Status: AC

## 2020-10-13 MED ORDER — MIDAZOLAM HCL 2 MG/2ML IJ SOLN
INTRAMUSCULAR | Status: AC
  Filled 2020-10-13: qty 2

## 2020-10-13 MED ORDER — EPHEDRINE 5 MG/ML INJ
INTRAVENOUS | Status: AC
  Filled 2020-10-13: qty 10

## 2020-10-13 MED ORDER — FENTANYL CITRATE (PF) 100 MCG/2ML IJ SOLN
INTRAMUSCULAR | Status: AC
  Administered 2020-10-13: 25 ug via INTRAVENOUS
  Filled 2020-10-13: qty 2

## 2020-10-13 MED ORDER — OXYCODONE-ACETAMINOPHEN 5-325 MG PO TABS: 1.0000 | ORAL_TABLET | ORAL | 0 refills | Status: DC | PRN

## 2020-10-13 MED ORDER — LIDOCAINE HCL (PF) 2 % IJ SOLN
INTRAMUSCULAR | Status: AC
  Filled 2020-10-13: qty 5

## 2020-10-13 MED ORDER — PROPOFOL 10 MG/ML IV BOLUS
INTRAVENOUS | Status: DC | PRN
  Administered 2020-10-13: 40 mg via INTRAVENOUS

## 2020-10-13 MED ORDER — MEGESTROL ACETATE 400 MG/10ML PO SUSP
200.0000 mg | Freq: Two times a day (BID) | ORAL | Status: DC
  Administered 2020-10-14 – 2020-10-24 (×21): 200 mg via ORAL
  Filled 2020-10-13 (×8): qty 10
  Filled 2020-10-13: qty 5
  Filled 2020-10-13 (×2): qty 10
  Filled 2020-10-13: qty 5
  Filled 2020-10-13 (×13): qty 10

## 2020-10-13 MED ORDER — LACTATED RINGERS IV SOLN
INTRAVENOUS | Status: DC | PRN
  Administered 2020-10-13: 1000 mL via INTRAVENOUS

## 2020-10-13 MED ORDER — PRAVASTATIN SODIUM 40 MG PO TABS
20.0000 mg | ORAL_TABLET | Freq: Every day | ORAL | Status: DC
  Administered 2020-10-13 – 2020-10-24 (×12): 20 mg via ORAL
  Filled 2020-10-13 (×12): qty 1

## 2020-10-13 MED ORDER — PHENYLEPHRINE 40 MCG/ML (10ML) SYRINGE FOR IV PUSH (FOR BLOOD PRESSURE SUPPORT)
PREFILLED_SYRINGE | INTRAVENOUS | Status: AC
  Filled 2020-10-13: qty 10

## 2020-10-13 MED ORDER — 0.9 % SODIUM CHLORIDE (POUR BTL) OPTIME
TOPICAL | Status: DC | PRN
  Administered 2020-10-13: 1000 mL

## 2020-10-13 MED ORDER — ONDANSETRON HCL 4 MG/2ML IJ SOLN
INTRAMUSCULAR | Status: AC
  Filled 2020-10-13: qty 4

## 2020-10-13 MED ORDER — EPHEDRINE SULFATE-NACL 50-0.9 MG/10ML-% IV SOSY
PREFILLED_SYRINGE | INTRAVENOUS | Status: DC | PRN
  Administered 2020-10-13: 10 mg via INTRAVENOUS

## 2020-10-13 MED ORDER — HYDROMORPHONE HCL 1 MG/ML IJ SOLN
0.5000 mg | INTRAMUSCULAR | Status: DC | PRN
  Administered 2020-10-13: 0.5 mg via INTRAVENOUS
  Filled 2020-10-13: qty 0.5

## 2020-10-13 MED ORDER — FENTANYL CITRATE (PF) 100 MCG/2ML IJ SOLN: 25.0000 ug | INTRAMUSCULAR | Status: DC | PRN

## 2020-10-13 MED ORDER — OXYCODONE HCL 5 MG PO TABS
ORAL_TABLET | ORAL | Status: AC
  Filled 2020-10-13: qty 1

## 2020-10-13 MED ORDER — METOPROLOL TARTRATE 12.5 MG HALF TABLET
12.5000 mg | ORAL_TABLET | Freq: Two times a day (BID) | ORAL | Status: DC
  Administered 2020-10-13 – 2020-10-24 (×22): 12.5 mg via ORAL
  Filled 2020-10-13 (×22): qty 1

## 2020-10-13 MED ORDER — LACTATED RINGERS IV SOLN: INTRAVENOUS | Status: DC

## 2020-10-13 MED ORDER — CEFAZOLIN SODIUM-DEXTROSE 2-4 GM/100ML-% IV SOLN
INTRAVENOUS | Status: AC
  Filled 2020-10-13: qty 100

## 2020-10-13 MED ORDER — FENTANYL CITRATE (PF) 100 MCG/2ML IJ SOLN: 25.0000 ug | Freq: Once | INTRAMUSCULAR | Status: AC

## 2020-10-13 MED ORDER — PROPOFOL 10 MG/ML IV BOLUS
INTRAVENOUS | Status: AC
  Filled 2020-10-13: qty 20

## 2020-10-13 MED ORDER — ALBUTEROL SULFATE HFA 108 (90 BASE) MCG/ACT IN AERS
2.0000 | INHALATION_SPRAY | Freq: Four times a day (QID) | RESPIRATORY_TRACT | Status: DC | PRN
  Filled 2020-10-13: qty 6.7

## 2020-10-13 MED ORDER — PANTOPRAZOLE SODIUM 40 MG PO TBEC
40.0000 mg | DELAYED_RELEASE_TABLET | Freq: Two times a day (BID) | ORAL | Status: DC
  Administered 2020-10-13 – 2020-10-24 (×22): 40 mg via ORAL
  Filled 2020-10-13 (×23): qty 1

## 2020-10-13 MED ORDER — CEFAZOLIN SODIUM-DEXTROSE 2-4 GM/100ML-% IV SOLN: 2.0000 g | INTRAVENOUS | Status: DC

## 2020-10-13 MED ORDER — CEFAZOLIN SODIUM-DEXTROSE 2-4 GM/100ML-% IV SOLN
2.0000 g | Freq: Four times a day (QID) | INTRAVENOUS | Status: AC
  Administered 2020-10-13 – 2020-10-14 (×3): 2 g via INTRAVENOUS
  Filled 2020-10-13 (×3): qty 100

## 2020-10-13 MED ORDER — OXYCODONE HCL 5 MG/5ML PO SOLN: 5.0000 mg | Freq: Once | ORAL | Status: AC | PRN

## 2020-10-13 MED ORDER — CHLORHEXIDINE GLUCONATE 0.12 % MT SOLN: 15.0000 mL | Freq: Once | OROMUCOSAL | Status: AC

## 2020-10-13 MED ORDER — SODIUM CHLORIDE 0.9 % IV SOLN: INTRAVENOUS | Status: DC

## 2020-10-13 MED ORDER — CHLORHEXIDINE GLUCONATE 0.12 % MT SOLN
OROMUCOSAL | Status: AC
  Administered 2020-10-13: 15 mL via OROMUCOSAL
  Filled 2020-10-13: qty 15

## 2020-10-13 MED ORDER — INSULIN GLARGINE 100 UNIT/ML ~~LOC~~ SOLN
20.0000 [IU] | Freq: Every day | SUBCUTANEOUS | Status: DC
  Administered 2020-10-13 – 2020-10-23 (×11): 20 [IU] via SUBCUTANEOUS
  Filled 2020-10-13 (×12): qty 0.2

## 2020-10-13 MED ORDER — ONDANSETRON HCL 4 MG/2ML IJ SOLN: 4.0000 mg | Freq: Four times a day (QID) | INTRAMUSCULAR | Status: DC | PRN

## 2020-10-13 MED ORDER — METOCLOPRAMIDE HCL 5 MG PO TABS: 5.0000 mg | ORAL_TABLET | Freq: Three times a day (TID) | ORAL | Status: DC | PRN

## 2020-10-13 MED ORDER — CEFAZOLIN SODIUM-DEXTROSE 2-3 GM-%(50ML) IV SOLR
INTRAVENOUS | Status: DC | PRN
  Administered 2020-10-13: 2 g via INTRAVENOUS

## 2020-10-13 MED ORDER — METOCLOPRAMIDE HCL 5 MG/ML IJ SOLN: 5.0000 mg | Freq: Three times a day (TID) | INTRAMUSCULAR | Status: DC | PRN

## 2020-10-13 MED ORDER — DOCUSATE SODIUM 100 MG PO CAPS
100.0000 mg | ORAL_CAPSULE | Freq: Two times a day (BID) | ORAL | Status: DC
  Administered 2020-10-13 – 2020-10-24 (×22): 100 mg via ORAL
  Filled 2020-10-13 (×23): qty 1

## 2020-10-13 MED ORDER — CLOPIDOGREL BISULFATE 75 MG PO TABS
75.0000 mg | ORAL_TABLET | Freq: Every day | ORAL | Status: DC
  Administered 2020-10-13 – 2020-10-24 (×12): 75 mg via ORAL
  Filled 2020-10-13 (×12): qty 1

## 2020-10-13 MED ORDER — OXYCODONE-ACETAMINOPHEN 5-325 MG PO TABS
1.0000 | ORAL_TABLET | ORAL | 0 refills | Status: DC | PRN
Start: 2020-10-13 — End: 2020-10-17

## 2020-10-13 MED ORDER — FUROSEMIDE 40 MG PO TABS
40.0000 mg | ORAL_TABLET | Freq: Two times a day (BID) | ORAL | Status: DC
  Administered 2020-10-13 – 2020-10-24 (×23): 40 mg via ORAL
  Filled 2020-10-13 (×23): qty 1

## 2020-10-13 MED ORDER — OXYCODONE HCL 5 MG PO TABS
5.0000 mg | ORAL_TABLET | Freq: Once | ORAL | Status: AC | PRN
  Administered 2020-10-13: 5 mg via ORAL

## 2020-10-13 MED ORDER — PROPOFOL 500 MG/50ML IV EMUL
INTRAVENOUS | Status: DC | PRN
  Administered 2020-10-13: 75 ug/kg/min via INTRAVENOUS

## 2020-10-13 MED ORDER — OXYCODONE HCL 5 MG PO TABS
5.0000 mg | ORAL_TABLET | ORAL | Status: DC | PRN
  Administered 2020-10-13 – 2020-10-14 (×2): 5 mg via ORAL
  Administered 2020-10-14: 10 mg via ORAL
  Administered 2020-10-15 – 2020-10-16 (×2): 5 mg via ORAL
  Filled 2020-10-13 (×2): qty 1
  Filled 2020-10-13: qty 2
  Filled 2020-10-13 (×2): qty 1

## 2020-10-13 MED ORDER — BUPIVACAINE HCL 0.5 % IJ SOLN
INTRAMUSCULAR | Status: DC | PRN
  Administered 2020-10-13: 30 mL

## 2020-10-13 MED ORDER — INSULIN ASPART 100 UNIT/ML ~~LOC~~ SOLN
0.0000 [IU] | Freq: Three times a day (TID) | SUBCUTANEOUS | Status: DC
  Administered 2020-10-14: 3 [IU] via SUBCUTANEOUS
  Administered 2020-10-14: 2 [IU] via SUBCUTANEOUS
  Administered 2020-10-14: 3 [IU] via SUBCUTANEOUS
  Administered 2020-10-15: 2 [IU] via SUBCUTANEOUS
  Administered 2020-10-15: 3 [IU] via SUBCUTANEOUS
  Administered 2020-10-15: 5 [IU] via SUBCUTANEOUS
  Administered 2020-10-16: 3 [IU] via SUBCUTANEOUS
  Administered 2020-10-16: 5 [IU] via SUBCUTANEOUS
  Administered 2020-10-16: 3 [IU] via SUBCUTANEOUS
  Administered 2020-10-17 (×2): 2 [IU] via SUBCUTANEOUS
  Administered 2020-10-17: 5 [IU] via SUBCUTANEOUS
  Administered 2020-10-18 (×2): 3 [IU] via SUBCUTANEOUS
  Administered 2020-10-18 – 2020-10-19 (×2): 2 [IU] via SUBCUTANEOUS
  Administered 2020-10-19: 5 [IU] via SUBCUTANEOUS
  Administered 2020-10-19: 3 [IU] via SUBCUTANEOUS
  Administered 2020-10-20: 15 [IU] via SUBCUTANEOUS
  Administered 2020-10-20: 5 [IU] via SUBCUTANEOUS
  Administered 2020-10-20 – 2020-10-21 (×2): 3 [IU] via SUBCUTANEOUS
  Administered 2020-10-21: 2 [IU] via SUBCUTANEOUS
  Administered 2020-10-21 – 2020-10-22 (×2): 3 [IU] via SUBCUTANEOUS
  Administered 2020-10-22: 5 [IU] via SUBCUTANEOUS
  Administered 2020-10-23: 3 [IU] via SUBCUTANEOUS
  Administered 2020-10-23 – 2020-10-24 (×3): 2 [IU] via SUBCUTANEOUS
  Administered 2020-10-24: 3 [IU] via SUBCUTANEOUS

## 2020-10-13 MED ORDER — DULOXETINE HCL 60 MG PO CPEP
60.0000 mg | ORAL_CAPSULE | Freq: Every day | ORAL | Status: DC
  Administered 2020-10-13 – 2020-10-24 (×12): 60 mg via ORAL
  Filled 2020-10-13 (×12): qty 1

## 2020-10-13 MED ORDER — METOPROLOL TARTRATE 12.5 MG HALF TABLET
ORAL_TABLET | ORAL | Status: AC
  Filled 2020-10-13: qty 1

## 2020-10-13 SURGICAL SUPPLY — 28 items
BLADE SURG 21 STRL SS (BLADE) ×3 IMPLANT
BNDG COHESIVE 4X5 TAN STRL (GAUZE/BANDAGES/DRESSINGS) ×3 IMPLANT
BNDG GAUZE ELAST 4 BULKY (GAUZE/BANDAGES/DRESSINGS) ×3 IMPLANT
COVER SURGICAL LIGHT HANDLE (MISCELLANEOUS) ×6 IMPLANT
COVER WAND RF STERILE (DRAPES) ×3 IMPLANT
DRAPE U-SHAPE 47X51 STRL (DRAPES) ×6 IMPLANT
DRSG ADAPTIC 3X8 NADH LF (GAUZE/BANDAGES/DRESSINGS) ×3 IMPLANT
DRSG PAD ABDOMINAL 8X10 ST (GAUZE/BANDAGES/DRESSINGS) ×6 IMPLANT
DURAPREP 26ML APPLICATOR (WOUND CARE) ×3 IMPLANT
ELECT REM PT RETURN 9FT ADLT (ELECTROSURGICAL) ×3
ELECTRODE REM PT RTRN 9FT ADLT (ELECTROSURGICAL) ×1 IMPLANT
GAUZE SPONGE 4X4 12PLY STRL (GAUZE/BANDAGES/DRESSINGS) ×3 IMPLANT
GLOVE BIOGEL PI IND STRL 9 (GLOVE) ×1 IMPLANT
GLOVE BIOGEL PI INDICATOR 9 (GLOVE) ×2
GLOVE SURG ORTHO 9.0 STRL STRW (GLOVE) ×3 IMPLANT
GOWN STRL REUS W/ TWL XL LVL3 (GOWN DISPOSABLE) ×2 IMPLANT
GOWN STRL REUS W/TWL XL LVL3 (GOWN DISPOSABLE) ×6
KIT BASIN OR (CUSTOM PROCEDURE TRAY) ×3 IMPLANT
KIT TURNOVER KIT B (KITS) ×3 IMPLANT
NS IRRIG 1000ML POUR BTL (IV SOLUTION) ×3 IMPLANT
PACK ORTHO EXTREMITY (CUSTOM PROCEDURE TRAY) ×3 IMPLANT
PAD ABD 8X10 STRL (GAUZE/BANDAGES/DRESSINGS) ×2 IMPLANT
PAD ARMBOARD 7.5X6 YLW CONV (MISCELLANEOUS) ×6 IMPLANT
SUT ETHILON 2 0 PSLX (SUTURE) ×3 IMPLANT
TOWEL GREEN STERILE (TOWEL DISPOSABLE) ×3 IMPLANT
TUBE CONNECTING 12'X1/4 (SUCTIONS) ×1
TUBE CONNECTING 12X1/4 (SUCTIONS) ×2 IMPLANT
YANKAUER SUCT BULB TIP NO VENT (SUCTIONS) ×3 IMPLANT

## 2020-10-13 NOTE — Anesthesia Postprocedure Evaluation (Signed)
Anesthesia Post Note  Patient: Yvonne [name]  Procedure(s) Performed: LEFT FOOT 1ST RAY AMPUTATION (Left )     Patient location during evaluation: PACU Anesthesia Type: Regional Level of consciousness: awake and alert Pain management: pain level controlled Vital Signs Assessment: post-procedure vital signs reviewed and stable Respiratory status: spontaneous breathing, nonlabored ventilation, respiratory function stable and patient connected to nasal cannula oxygen Cardiovascular status: stable and blood pressure returned to baseline Postop Assessment: no apparent nausea or vomiting Anesthetic complications: no   No complications documented.  Last Vitals:  Vitals:   10/13/20 1435 10/13/20 1526  BP: (!) 143/64 (!) 148/66  Pulse: 67 72  Resp: 13 18  Temp: (!) 36.3 C 36.7 C  SpO2: 100% 100%    Last Pain:  Vitals:   10/13/20 1526  TempSrc: Oral  PainSc:                  Effie Berkshire

## 2020-10-13 NOTE — Anesthesia Preprocedure Evaluation (Addendum)
Anesthesia Evaluation  Patient identified by MRN, date of birth, ID band Patient awake    Reviewed: Allergy & Precautions, NPO status , Patient's Chart, lab work & pertinent test results  Airway Mallampati: III  TM Distance: >3 FB Neck ROM: Full    Dental  (+) Edentulous Upper, Edentulous Lower   Pulmonary asthma , COPD,  COPD inhaler, Patient abstained from smoking., former smoker,    breath sounds clear to auscultation       Cardiovascular hypertension, Pt. on medications and Pt. on home beta blockers + Peripheral Vascular Disease and +CHF   Rhythm:Regular Rate:Normal     Neuro/Psych PSYCHIATRIC DISORDERS Anxiety Depression Dementia    GI/Hepatic Neg liver ROS, GERD  Medicated,  Endo/Other  diabetes, Type 2, Insulin Dependent  Renal/GU CRFRenal disease     Musculoskeletal negative musculoskeletal ROS (+)   Abdominal (+) + obese,   Peds  Hematology negative hematology ROS (+)   Anesthesia Other Findings   Reproductive/Obstetrics                            Anesthesia Physical Anesthesia Plan  ASA: III  Anesthesia Plan: Regional   Post-op Pain Management:    Induction: Intravenous  PONV Risk Score and Plan: Propofol infusion, Ondansetron and Treatment may vary due to age or medical condition  Airway Management Planned: Natural Airway and Simple Face Mask  Additional Equipment: None  Intra-op Plan:   Post-operative Plan:   Informed Consent: I have reviewed the patients History and Physical, chart, labs and discussed the procedure including the risks, benefits and alternatives for the proposed anesthesia with the patient or authorized representative who has indicated his/her understanding and acceptance.     Consent reviewed with POA  Plan Discussed with: CRNA  Anesthesia Plan Comments:        Anesthesia Quick Evaluation

## 2020-10-13 NOTE — Anesthesia Procedure Notes (Signed)
Anesthesia Regional Block: Ankle block   Pre-Anesthetic Checklist: ,, timeout performed, Correct Patient, Correct Site, Correct Laterality, Correct Procedure, Correct Position, site marked, Risks and benefits discussed,  Surgical consent,  Pre-op evaluation,  At surgeon's request and post-op pain management  Laterality: Left  Prep: chloraprep       Needles:  Injection technique: Single-shot  Needle Type: Echogenic Stimulator Needle     Needle Length: 9cm  Needle Gauge: 21     Additional Needles:   Narrative:  Start time: 10/13/2020 10:35 AM End time: 10/13/2020 10:40 AM Injection made incrementally with aspirations every 5 mL.  Performed by: Personally  Anesthesiologist: Effie Berkshire, MD  Additional Notes: Patient tolerated the procedure well. Local anesthetic introduced in an incremental fashion under minimal resistance after negative aspirations. No paresthesias were elicited. After completion of the procedure, no acute issues were identified and patient continued to be monitored by RN.
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
## 2020-10-13 NOTE — Progress Notes (Signed)
Patient's ring removed and given to daughter in law, Charlynne Pander by Rolla Flatten, RN

## 2020-10-13 NOTE — Interval H&P Note (Signed)
History and Physical Interval Note:  10/13/2020 10:01 AM  Yvonne [name]  has presented today for surgery, with the diagnosis of Chronic Osteomyelitis Left Great Toe.  The various methods of treatment have been discussed with the patient and family. After consideration of risks, benefits and other options for treatment, the patient has consented to  Procedure(s): LEFT FOOT 1ST RAY AMPUTATION (Left) as a surgical intervention.  The patient's history has been reviewed, patient examined, no change in status, stable for surgery.  I have reviewed the patient's chart and labs.  Questions were answered to the patient's satisfaction.     [name]
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
## 2020-10-13 NOTE — Progress Notes (Signed)
Orthopedic Tech Progress Note Patient Details:  Yvonne [name] 06/07/1946 [id number] PACU RN called requesting a POST OP SHOE Ortho Devices Type of Ortho Device: Postop shoe/boot Ortho Device/Splint Location: RLE Ortho Device/Splint Interventions: Ordered, Application   Post Interventions Patient Tolerated: Well Instructions Provided: Care of device   Janit Pagan 10/13/2020, 1:06 PM

## 2020-10-13 NOTE — Progress Notes (Signed)
Patient pulled continuous pulse ox off her finger stating she don't want it on, on coming nurse made aware.

## 2020-10-13 NOTE — Op Note (Signed)
10/13/2020  12:13 PM  PATIENT:  Yvonne [name]    PRE-OPERATIVE DIAGNOSIS:  Chronic Osteomyelitis Left Great Toe  POST-OPERATIVE DIAGNOSIS:  Same  PROCEDURE:  LEFT FOOT 1ST RAY AMPUTATION Local tissue rearrangement for wound closure 9x4 cm  SURGEON:  [name], MD  PHYSICIAN ASSISTANT:None ANESTHESIA:   General  PREOPERATIVE INDICATIONS:  Yvonne [name] is a  74 y.o. female with a diagnosis of Chronic Osteomyelitis Left Great Toe who failed conservative measures and elected for surgical management.    The risks benefits and alternatives were discussed with the patient preoperatively including but not limited to the risks of infection, bleeding, nerve injury, cardiopulmonary complications, the need for revision surgery, among others, and the patient was willing to proceed.  OPERATIVE IMPLANTS: None  @ENCIMAGES @  OPERATIVE FINDINGS: Tissue margins are clear with no evidence of abscess or tissue necrosis  OPERATIVE PROCEDURE: Patient was brought the operating room and underwent a regional anesthetic.  After adequate levels anesthesia were obtained patient's left lower extremity was prepped using DuraPrep draped into a sterile field a timeout was called.  A racquet incision was made around the great toe and the ulcerative tissue this left a wound that was 9 x 4 cm.  Electrocautery was used hemostasis the wound was irrigated with normal saline the toe was amputated through the base of the first metatarsal medial cuneiform joint.  Local tissue rearrangement was used to close the wound 9 x 4 cm sterile dressing was applied patient was taken the PACU in stable condition   DISCHARGE PLANNING:  Antibiotic duration: Preoperative antibiotics  Weightbearing: Touchdown weightbearing on the left  Pain medication: Percocet  Dressing care/ Wound VAC: Dry dressing follow-up in the office in 1 week  Ambulatory devices: Walker  Discharge to: Home  Follow-up: In the office 1 week post  operative.

## 2020-10-13 NOTE — H&P (Signed)
Yvonne [name] is an 74 y.o. female.   Chief Complaint: left foot osteomyelitis HPI: Patient is a 74 year old woman with diabetes who was seen for initial evaluation for swelling and ulceration left foot.  Patient complains of swelling and drainage from the ulcer medial aspect of the left great toe.   Past Medical History:  Diagnosis Date  . Asthma   . Blood transfusion   . Cervicalgia   . CHF (congestive heart failure) ([place])   . Chronic pain syndrome   . CKD (chronic kidney disease)   . COPD (chronic obstructive pulmonary disease) ([place])   . Dementia ([place])   . Diabetes mellitus   . Disturbance of skin sensation   . GERD (gastroesophageal reflux disease)   . Hyperlipidemia   . Hypertension   . Insomnia   . Lumbago   . Olecranon bursitis   . Pain in joint, hand   . Polyneuropathy in diabetes(357.2)     Past Surgical History:  Procedure Laterality Date  . ABDOMINAL AORTOGRAM W/LOWER EXTREMITY N/A 09/27/2020   Procedure: ABDOMINAL AORTOGRAM W/LOWER EXTREMITY;  Surgeon: Marty Heck, MD;  Location: [facility];  Service: Cardiovascular;  Laterality: N/A;  . ABDOMINAL HYSTERECTOMY  1970's  . BACK SURGERY     from bacteria  . BIOPSY  03/05/2020   Procedure: BIOPSY;  Surgeon: Yetta Flock, MD;  Location: [facility];  Service: Gastroenterology;;  . CATARACT EXTRACTION    . ESOPHAGOGASTRODUODENOSCOPY (EGD) WITH PROPOFOL N/A 03/05/2020   Procedure: ESOPHAGOGASTRODUODENOSCOPY (EGD) WITH PROPOFOL;  Surgeon: Yetta Flock, MD;  Location: [place];  Service: Gastroenterology;  Laterality: N/A;  . PERIPHERAL VASCULAR INTERVENTION Left 09/27/2020   Procedure: PERIPHERAL VASCULAR INTERVENTION;  Surgeon: Marty Heck, MD;  Location: [place];  Service: Cardiovascular;  Laterality: Left;  . SHOULDER ARTHROSCOPY Right     No family history on file. Social History:  reports that she has quit smoking. Her smoking use included cigarettes. She  has a 15.00 pack-year smoking history. She has never used smokeless tobacco. She reports that she does not drink alcohol and does not use drugs.  Allergies:  Allergies  Allergen Reactions  . Clonazepam     Unsteady gait, ineffective  . Trazodone And Nefazodone     Unsteady gait, disoriented    No medications prior to admission.    No results found for this or any previous visit (from the past 48 hour(s)). No results found.  Review of Systems  All other systems reviewed and are negative.   There were no vitals taken for this visit. Physical Exam  Patient is alert, oriented, no adenopathy, well-dressed, normal affect, normal respiratory effort. Examination patient has massive swelling I cannot palpate a dorsalis pedis pulse the Doppler was used and she has a strong biphasic dorsalis pedis and posterior tibial pulse.  Patient is ambulating in a wheelchair she has venous stasis swelling with pitting edema of the entire left lower extremity there is no ascending cellulitis there is an ulcer over the medial aspect of the MTP joint and this probes 2 cm deep into the MTP joint.  There is no cellulitis no purulence.Heart RRR Lungs clear  Most recent hemoglobin A1c 10.1 Assessment/Plan 1. Pain in left foot   2. Osteomyelitis of great toe of left foot ([place])     Plan: Discussed with the patient that she has chronic osteomyelitis and we would need to proceed with a first ray amputation.  Discussed that there is a risk  of the wound not healing need for additional surgery.  Patient states she understands wished to proceed I recommend she call her family to let them know of her proposed surgery next week.  Plan for outpatient surgery.   Bevely Palmer Shantell Belongia, PA 10/13/2020, 6:55 AM

## 2020-10-13 NOTE — Discharge Instructions (Addendum)

## 2020-10-14 DIAGNOSIS — M86172 Other acute osteomyelitis, left ankle and foot: Secondary | ICD-10-CM | POA: Diagnosis not present

## 2020-10-14 LAB — GLUCOSE, CAPILLARY
Glucose-Capillary: 164 mg/dL — ABNORMAL HIGH (ref 70–99)
Glucose-Capillary: 158 mg/dL — ABNORMAL HIGH (ref 70–99)
Glucose-Capillary: 150 mg/dL — ABNORMAL HIGH (ref 70–99)
Glucose-Capillary: 170 mg/dL — ABNORMAL HIGH (ref 70–99)

## 2020-10-14 MED ORDER — CHLORHEXIDINE GLUCONATE CLOTH 2 % EX PADS
6.0000 | MEDICATED_PAD | Freq: Every day | CUTANEOUS | Status: DC
  Administered 2020-10-15 – 2020-10-24 (×7): 6 via TOPICAL

## 2020-10-14 MED ORDER — NYSTATIN 100000 UNIT/GM EX POWD
Freq: Three times a day (TID) | CUTANEOUS | Status: DC
  Filled 2020-10-14: qty 15

## 2020-10-14 NOTE — Progress Notes (Signed)
Patient ID: Yvonne [name], female   DOB: 12-May-1946, 74 y.o.   MRN: [id number] Patient is status post left foot first ray amputation.  Patient does have a yeast rash in her abdomen and groin.  Orders are written for Mycostatin powder.  Anticipate discharge back to skilled nursing.

## 2020-10-14 NOTE — Care Management CC44 (Signed)
Condition Code 44 Documentation Completed  Patient Details  Name: Yvonne [name] MRN: [id number] Date of Birth: June 24, 1946   Condition Code 44 given:  Yes Patient signature on Condition Code 44 notice:  Yes Documentation of 2 MD's agreement:  Yes Code 44 added to claim:  Yes    Sharin Mons, RN 10/14/2020, 5:28 PM

## 2020-10-14 NOTE — Progress Notes (Signed)
Very restless- frequently stating she had to urinate- was urinating to some degree but was incontinent and difficult to measure-notified Dr. Sharol Given to cathing her and obtaining a urinalysis- 16Fr indwelling catheter placed got immediate 987ml urine return- dd stop the request s to get up to urinate- continues to pull at lines and had removed IV site despite my wrapping it with coban to safe guard against that happening. Urine sent to lab- appears to negative for infection. After new line started and iv pain medicine administered did she settle down and go to sleep.

## 2020-10-14 NOTE — Progress Notes (Addendum)
Placed PT eval and treat order per MD as requested by CSW. Per Dr. Sharol Given, OB-GYN consult will be placed on 11/22 for further testing.  Primary RN updated.
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
## 2020-10-14 NOTE — Care Management Obs Status (Signed)
[place] NOTIFICATION   Patient Details  Name: Yvonne [name] MRN: [id number] Date of Birth: 02-17-1946   Medicare Observation Status Notification Given:  Yes (Left @ bedside for daughter [name])    Sharin Mons, RN 10/14/2020, 5:28 PM

## 2020-10-15 DIAGNOSIS — M86172 Other acute osteomyelitis, left ankle and foot: Secondary | ICD-10-CM | POA: Diagnosis not present

## 2020-10-15 LAB — GLUCOSE, CAPILLARY
Glucose-Capillary: 153 mg/dL — ABNORMAL HIGH (ref 70–99)
Glucose-Capillary: 147 mg/dL — ABNORMAL HIGH (ref 70–99)
Glucose-Capillary: 207 mg/dL — ABNORMAL HIGH (ref 70–99)
Glucose-Capillary: 214 mg/dL — ABNORMAL HIGH (ref 70–99)

## 2020-10-15 NOTE — Progress Notes (Signed)
Patient ID: Yvonne [name], female   DOB: Apr 26, 1946, 74 y.o.   MRN: [id number] Patient is alert without complaints this morning.  The dressing is clean and dry plan for discharge to skilled nursing Monday or Tuesday.
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
## 2020-10-15 NOTE — Plan of Care (Signed)
Pt was resting for most of the morning. She tried to keep getting out of bed throughout the day, so she sat in chair at nurses station to keep her safe. No complaints from patient. Will continue to monitor.    Problem: Activity: Goal: Risk for activity intolerance will decrease Outcome: Progressing   Problem: Nutrition: Goal: Adequate nutrition will be maintained Outcome: Progressing   Problem: Coping: Goal: Level of anxiety will decrease Outcome: Progressing   Problem: Pain Managment: Goal: General experience of comfort will improve Outcome: Progressing

## 2020-10-15 NOTE — TOC Progression Note (Signed)
Transition of Care [facility]) - Progression Note    Patient Details  Name: Yvonne [name] MRN: [id number] Date of Birth: 1946/03/09  Transition of Care [facility]) CM/SW [place], [place] Phone Number: 10/15/2020, 10:52 AM  Clinical Narrative:     CSW spoke with Admission Director [place] with [facility].  [facility] does not accept admission on weekend. Contact admission director Shazma at [phone] on Monday.  CSW will continue to assist with disposition planning.       Expected Discharge Plan and Services           Expected Discharge Date: 10/14/20                                     Social Determinants of Health (SDOH) Interventions    Readmission Risk Interventions No flowsheet data found.

## 2020-10-16 ENCOUNTER — Ambulatory Visit (HOSPITAL_COMMUNITY)
Admission: EM | Admit: 2020-10-16 | Discharge: 2020-10-16 | Disposition: A | Discharge status: Home / Self Care | Payer: No Typology Code available for payment source | Source: Ambulatory Visit | Attending: Emergency Medicine | Admitting: Emergency Medicine

## 2020-10-16 ENCOUNTER — Encounter (HOSPITAL_COMMUNITY): Payer: Self-pay | Admitting: Orthopedic Surgery

## 2020-10-16 DIAGNOSIS — Z113 Encounter for screening for infections with a predominantly sexual mode of transmission: Secondary | ICD-10-CM

## 2020-10-16 DIAGNOSIS — Z0441 Encounter for examination and observation following alleged adult rape: Secondary | ICD-10-CM | POA: Insufficient documentation

## 2020-10-16 DIAGNOSIS — M86172 Other acute osteomyelitis, left ankle and foot: Secondary | ICD-10-CM | POA: Diagnosis not present

## 2020-10-16 LAB — GLUCOSE, CAPILLARY
Glucose-Capillary: 212 mg/dL — ABNORMAL HIGH (ref 70–99)
Glucose-Capillary: 199 mg/dL — ABNORMAL HIGH (ref 70–99)
Glucose-Capillary: 158 mg/dL — ABNORMAL HIGH (ref 70–99)
Glucose-Capillary: 250 mg/dL — ABNORMAL HIGH (ref 70–99)

## 2020-10-16 LAB — HEPATITIS B SURFACE ANTIGEN: Hepatitis B Surface Ag: NONREACTIVE

## 2020-10-16 LAB — HIV ANTIBODY (ROUTINE TESTING W REFLEX): HIV Screen 4th Generation wRfx: NONREACTIVE

## 2020-10-16 LAB — HEPATITIS C ANTIBODY: HCV Ab: NONREACTIVE
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
## 2020-10-16 NOTE — [place] FL2 (Addendum)
MEDICAID FL2 LEVEL OF CARE SCREENING TOOL     IDENTIFICATION  Patient Name: Yvonne [name] Birthdate: Oct 19, 1946 Sex: female Admission Date (Current Location): 10/13/2020  [facility] and [place] Number:  Herbalist and Address:  [facility], [place] [address] [place], [place] [address]      Provider Number: [id number]  Attending Physician Name and Address:  [name], MD  Relative Name and Phone Number:  [name], [phone]    Current Level of Care: Hospital Recommended Level of Care: Memory Care Prior Approval Number:    Date Approved/Denied:   PASRR Number:    Discharge Plan: Other (Comment) (memory care)    Current Diagnoses:  Patient Active Problem List   Diagnosis Date Noted  . Dementia Osteomyelitis ([place]) 10/13/2020  . Acute hematogenous osteomyelitis of right foot ([place]) 10/13/2020  . Osteomyelitis of great toe of left foot ([place])   . PAD (peripheral artery disease) ([place]) 09/19/2020  . Palliative care by specialist   . Dysphagia   . Hematemesis with nausea   . DKA (diabetic ketoacidoses) 02/28/2020  . Altered mental status   . Dehydration   . Hypokalemia   . Diabetic neuropathy, painful ([place]) 07/20/2015  . Insomnia due to anxiety and fear 07/20/2015  . Anxiety disorder 10/03/2014  . Preventative health care 10/03/2014  . Type 2 diabetes mellitus without complication ([place]) 97/67/3419  . Depression 06/23/2014  . Essential hypertension 06/23/2014  . Peripheral neuropathic pain 06/23/2014  . Gastroesophageal reflux disease without esophagitis 06/23/2014  . Dyslipidemia 06/23/2014  . Chronic neck pain 03/09/2012  . Right arm pain 03/09/2012  . Chronic low back pain 03/09/2012  . Right knee pain 03/09/2012  . Joint stiffness of hand 03/09/2012  . Encephalopathy 02/05/2012  . Hypoxia 02/01/2012  . Fever 02/01/2012  . Leukocytosis 02/01/2012  . Bronchitis 02/01/2012  . Confusion 02/01/2012  . Diarrhea  02/01/2012  . COPD (chronic obstructive pulmonary disease) ([place]) 02/01/2012  . CAP (community acquired pneumonia) 02/01/2012  . Diabetes mellitus ([place])     Orientation RESPIRATION BLADDER Height & Weight     Self, Situation, Place  Normal Incontinent Weight: 203 lb 0.7 oz (92.1 kg) Height:  5\' 3"  (160 cm)  BEHAVIORAL SYMPTOMS/MOOD NEUROLOGICAL BOWEL NUTRITION STATUS      Continent Diet (Regular-no added salt)  AMBULATORY STATUS COMMUNICATION OF NEEDS Skin   Limited Assist Verbally Surgical wounds (Closed incision on foot)                       Personal Care Assistance Level of Assistance  Bathing, Feeding, Dressing Bathing Assistance: Limited assistance Feeding assistance: Limited assistance Dressing Assistance: Limited assistance     Functional Limitations Info             SPECIAL CARE FACTORS FREQUENCY  PT (By licensed PT), OT (By licensed OT)     PT Frequency: Home health 3x/week OT Frequency: home health 2x/week            Contractures Contractures Info: Not present    Additional Factors Info  Code Status, Allergies, Psychotropic Code Status Info: Full Allergies Info: Clonazepam, Trazodone And Nefazodone Psychotropic Info: Cymbalta         Current Medications (10/16/2020):  This is the current hospital active medication list Current Facility-Administered Medications  Medication Dose Route Frequency Provider Last Rate Last Admin  . 0.9 %  sodium chloride infusion   Intravenous Continuous Persons, Bevely Palmer, [place]      .  acetaminophen (TYLENOL) tablet 325-650 mg  325-650 mg Oral Q6H PRN Persons, Bevely Palmer, PA   650 mg at 10/15/20 1610  . albuterol (VENTOLIN HFA) 108 (90 Base) MCG/ACT inhaler 2 puff  2 puff Inhalation Q6H PRN Persons, Bevely Palmer, [place]      . Chlorhexidine Gluconate Cloth 2 % PADS 6 each  6 each Topical Daily [name], MD   6 each at 10/16/20 1228  . clopidogrel (PLAVIX) tablet 75 mg  75 mg Oral Daily Persons, Bevely Palmer, PA   75 mg at  10/16/20 0851  . docusate sodium (COLACE) capsule 100 mg  100 mg Oral BID Persons, Bevely Palmer, PA   100 mg at 10/16/20 0851  . DULoxetine (CYMBALTA) DR capsule 60 mg  60 mg Oral Daily Persons, Bevely Palmer, [place]   60 mg at 10/16/20 0852  . furosemide (LASIX) tablet 40 mg  40 mg Oral BID Persons, Bevely Palmer, PA   40 mg at 10/16/20 0601  . HYDROmorphone (DILAUDID) injection 0.5 mg  0.5 mg Intravenous Q4H PRN Persons, Bevely Palmer, PA   0.5 mg at 10/13/20 2308  . insulin aspart (novoLOG) injection 0-15 Units  0-15 Units Subcutaneous TID WC Persons, Bevely Palmer, [place]   3 Units at 10/16/20 1227  . insulin glargine (LANTUS) injection 20 Units  20 Units Subcutaneous QHS Persons, Bevely Palmer, [place]   20 Units at 10/15/20 2134  . megestrol (MEGACE) 400 MG/10ML suspension 200 mg  200 mg Oral BID Persons, Bevely Palmer, PA   200 mg at 10/16/20 0851  . metoCLOPramide (REGLAN) tablet 5-10 mg  5-10 mg Oral Q8H PRN Persons, Bevely Palmer, PA       Or  . metoCLOPramide (REGLAN) injection 5-10 mg  5-10 mg Intravenous Q8H PRN Persons, Bevely Palmer, PA      . metoprolol tartrate (LOPRESSOR) tablet 12.5 mg  12.5 mg Oral BID Persons, Bevely Palmer, PA   12.5 mg at 10/16/20 0852  . nystatin (MYCOSTATIN/NYSTOP) topical powder   Topical TID [name], MD   Given at 10/16/20 [phone]  . ondansetron (ZOFRAN) tablet 4 mg  4 mg Oral Q6H PRN Persons, Bevely Palmer, PA       Or  . ondansetron [facility]) injection 4 mg  4 mg Intravenous Q6H PRN Persons, Bevely Palmer, [place]      . oxyCODONE (Oxy IR/ROXICODONE) immediate release tablet 5-10 mg  5-10 mg Oral Q4H PRN Persons, Bevely Palmer, PA   5 mg at 10/15/20 2134  . pantoprazole (PROTONIX) EC tablet 40 mg  40 mg Oral Q12H Persons, Bevely Palmer, [place]   40 mg at 10/16/20 3794  . pravastatin (PRAVACHOL) tablet 20 mg  20 mg Oral Daily Persons, Bevely Palmer, [place]   20 mg at 10/16/20 3276     Discharge Medications: Please see discharge summary for a list of discharge medications.  Relevant Imaging Results:  Relevant Lab  Results:   Additional Information [id number]  Benard Halsted, LCSW
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
## 2020-10-16 NOTE — TOC Progression Note (Signed)
Transition of Care [facility]) - Progression Note    Patient Details  Name: Yvonne [name] MRN: [id number] Date of Birth: Feb 02, 1946  Transition of Care [facility]) CM/SW [place], [place] Phone Number: 10/16/2020, 2:55 PM  Clinical Narrative:     CSW spoke to [place] with [facility]. [facility] is unable to extend bed offer due to pt needing a memory care unit. CSW notified RNCM.  Expected Discharge Plan: [place] Barriers to Discharge: No SNF bed, Insurance Authorization  Expected Discharge Plan and Services Expected Discharge Plan: [place]         Expected Discharge Date: 10/14/20                                     Social Determinants of Health (SDOH) Interventions    Readmission Risk Interventions No flowsheet data found.  Emeterio Reeve, Latanya Presser, [place] Social Worker [phone]
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
## 2020-10-16 NOTE — Progress Notes (Signed)
Foley catheter removed without any issue.

## 2020-10-16 NOTE — Progress Notes (Signed)
PT Cancellation Note  Patient Details Name: Yvonne [name] MRN: [id number] DOB: November 14, 1946   Cancelled Treatment:    Reason Eval/Treat Not Completed: Patient at procedure or test/unavailable   Currently SANE RNs in to see [name];   Will follow up later for PT session;   Roney Marion, [place] Pager [phone] Office [phone]    Colletta [place] 10/16/2020, 11:49 AM

## 2020-10-16 NOTE — Progress Notes (Signed)
RNCM consulted CSW for assistance with this case. Family requesting placement at [facility] care rather than return to [facility]. CSW spoke with [facility], [place]. She reported that patient cannot go there with a foley catheter. RN checking to see if catheter can be removed. Pamala Hurry reported that patient would need a TB test, COVID vaccine (J&J) if unvaccinated, and a video assessment. RNCM to follow up.   Breeona Waid LCSW

## 2020-10-16 NOTE — SANE Note (Signed)
Yvonne [name] contacted by phone and states she wishes to defer any decision making to [name] and that she is fine with [name]'s decision on proceeding with [facility] kit and FNE exam. Phone call conducted via speaker phone with Nance Pew present as a witness.

## 2020-10-16 NOTE — SANE Note (Addendum)
Dr. Rosana Hoes called back to let FNE know that family is now requesting a FNE exam for [facility] kit collection. Will call family to discuss.

## 2020-10-16 NOTE — Progress Notes (Signed)
Cervicalvaginal ancillary test collected by MD and sent to lab.
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
## 2020-10-16 NOTE — TOC Progression Note (Signed)
Transition of Care [facility]) - Progression Note    Patient Details  Name: Yvonne [name] MRN: [id number] Date of Birth: [birth year]  [facility] of [facility]) CM/SW Contact  Sharin Mons, RN Phone Number: 10/16/2020, 2:01 PM  Clinical Narrative:    After long discussion with [name] regarding pt's readiness to transition to next level of care: SNF. [name] states she doesn't want pt to return  back to [facility]. [name] stated she has spoken with admissions @ [facility] ( not knowing it was an ALF) and was told they have one female bed. [name] states that's where she would like for pt to d/c, [facility]. However, once [name] learned [facility] can't accept pt with a foley [name] was open to NCM sharing  SNF acceptances from other facilities.  [name] also stated she was unaware pt had a foley and would like to speak with MD, NCM to make MD aware.. NCM shared noted SNF acceptances [name]. [name] selected [facility]. NCM called and text [facility] Admission liaison regarding bed offer, awaiting response.  TOC team will continue to monitor and follow .....   Expected Discharge Plan: [place] Barriers to Discharge: No SNF bed, Insurance Authorization  Expected Discharge Plan and Services Expected Discharge Plan: [place]         Expected Discharge Date: 10/14/20                                     Social Determinants of Health (SDOH) Interventions    Readmission Risk Interventions No flowsheet data found.

## 2020-10-16 NOTE — Progress Notes (Signed)
Notified MD if catheter can be removed for acceptance to Memory care at [facility]. New order to D/C foley catheter.

## 2020-10-16 NOTE — Transfer of Care (Signed)
Immediate Anesthesia Transfer of Care Note  Patient: Yvonne [name]  Procedure(s) Performed: LEFT FOOT 1ST RAY AMPUTATION (Left )  Patient Location: PACU  Anesthesia Type:MAC  Level of Consciousness: drowsy and patient cooperative  Airway & Oxygen Therapy: Patient Spontanous Breathing  Post-op Assessment: Report given to RN, Post -op Vital signs reviewed and stable and Patient moving all extremities X 4  Post vital signs: Reviewed and stable  Last Vitals:  Vitals Value Taken Time  BP 123/62 10/16/20 0830  Temp 36.8 C 10/16/20 0830  Pulse 70 10/16/20 0830  Resp 17 10/16/20 0830  SpO2 98 % 10/16/20 0830    Last Pain:  Vitals:   10/16/20 0830  TempSrc: Oral  PainSc:          Complications: No complications documented.

## 2020-10-16 NOTE — Progress Notes (Signed)
Patient ID: Yvonne [name], female   DOB: 31-Aug-1946, 74 y.o.   MRN: [id number] Patient is status post left first ray amputation.  Plan for discharge to skilled nursing.  Patient's family has requested a GYN consult to evaluate for STD.  Will request consultation with GYN today.
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
## 2020-10-16 NOTE — Consult Note (Signed)
The SANE/FNE (Forensic Nurse Examiner) consult has been completed. The primary RN has been notified. Please contact the SANE/FNE nurse on call (listed in [place]) with any further concerns.  

## 2020-10-16 NOTE — Plan of Care (Signed)

## 2020-10-16 NOTE — TOC Progression Note (Signed)
Transition of Care [facility]) - Progression Note    Patient Details  Name: Yvonne [name] MRN: [id number] Date of Birth: Apr 02, 1946  Transition of Care [facility]) CM/SW Contact  Sharin Mons, RN Phone Number: 10/16/2020, 3:27 PM  Clinical Narrative:    Foley catheter d/c. [facility] reviewing referral for potential admission. [employer] to f/u with [facility] team  regarding acceptance. Call received from [facility] regarding insurance authorization for SNF placement. Peer to peer needed from MD by 9 am central by tomorrow if disposition to SNF needed, [phone], opt.5. NCM to make MD aware.  TOC team will continue to monitor ......   Expected Discharge Plan: Assisted Living Barriers to Discharge: No SNF bed, Insurance Authorization  Expected Discharge Plan and Services Expected Discharge Plan: Assisted Living         Expected Discharge Date: 10/14/20                                     Social Determinants of Health (SDOH) Interventions    Readmission Risk Interventions No flowsheet data found.
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
## 2020-10-17 ENCOUNTER — Other Ambulatory Visit: Payer: Self-pay | Admitting: Physician Assistant

## 2020-10-17 DIAGNOSIS — M86172 Other acute osteomyelitis, left ankle and foot: Secondary | ICD-10-CM | POA: Diagnosis not present

## 2020-10-17 LAB — RPR: RPR Ser Ql: NONREACTIVE

## 2020-10-17 LAB — CERVICOVAGINAL ANCILLARY ONLY
Chlamydia: NEGATIVE
Comment: NEGATIVE
Comment: NEGATIVE
Comment: NORMAL
Neisseria Gonorrhea: NEGATIVE
Trichomonas: NEGATIVE

## 2020-10-17 LAB — GLUCOSE, CAPILLARY
Glucose-Capillary: 130 mg/dL — ABNORMAL HIGH (ref 70–99)
Glucose-Capillary: 145 mg/dL — ABNORMAL HIGH (ref 70–99)
Glucose-Capillary: 182 mg/dL — ABNORMAL HIGH (ref 70–99)

## 2020-10-17 LAB — SARS CORONAVIRUS 2 BY RT PCR (HOSPITAL ORDER, PERFORMED IN ~~LOC~~ HOSPITAL LAB): SARS Coronavirus 2: NEGATIVE

## 2020-10-17 MED ORDER — OXYCODONE-ACETAMINOPHEN 5-325 MG PO TABS: 1.0000 | ORAL_TABLET | Freq: Four times a day (QID) | ORAL | 0 refills | Status: DC | PRN

## 2020-10-17 NOTE — Discharge Summary (Addendum)
Discharge Diagnoses:  Active Problems:   Osteomyelitis of great toe of left foot (HCC)   Osteomyelitis (HCC)   Acute hematogenous osteomyelitis of right foot (HCC)   Surgeries: Procedure(s): LEFT FOOT 1ST RAY AMPUTATION on 10/13/2020    Consultants:   Discharged Condition: Improved  Hospital Course: Yvonne [name] is an 74 y.o. female who was admitted 10/13/2020 with a chief complaint of left great toe osteomyelitis with a final diagnosis of Chronic Osteomyelitis Left Great Toe.  Patient was brought to the operating room on 10/13/2020 and underwent Procedure(s): LEFT FOOT 1ST RAY AMPUTATION.    Patient was given perioperative antibiotics:  Anti-infectives (From admission, onward)   Start     Dose/Rate Route Frequency Ordered Stop   10/13/20 1730  ceFAZolin (ANCEF) IVPB 2g/100 mL premix        2 g 200 mL/hr over 30 Minutes Intravenous Every 6 hours 10/13/20 1529 10/14/20 0610   10/13/20 0920  ceFAZolin (ANCEF) 2-4 GM/100ML-% IVPB       Note to Pharmacy: Pauletta Browns   : cabinet override      10/13/20 0920 10/13/20 2129   10/13/20 0915  ceFAZolin (ANCEF) IVPB 2g/100 mL premix  Status:  Discontinued        2 g 200 mL/hr over 30 Minutes Intravenous On call to O.R. 10/13/20 0910 10/13/20 1513    .  Patient was given sequential compression devices, early ambulation, and aspirin for DVT prophylaxis.  Recent vital signs:  Patient Vitals for the past 24 hrs:  BP Temp Temp src Pulse Resp SpO2  10/17/20 0351 (!) 110/59 98.1 F (36.7 C) Oral 76 17 99 %  10/16/20 2129 (!) 106/53 97.8 F (36.6 C) Oral 70 17 99 %  10/16/20 1430 124/64 98.1 F (36.7 C) Oral 82 17 100 %  10/16/20 0830 123/62 98.2 F (36.8 C) Oral 70 17 98 %  .  Recent laboratory studies: No results found.  Discharge Medications:   Allergies as of 10/17/2020      Reactions   Clonazepam Other (See Comments)   Unsteady gait, ineffective   Trazodone And Nefazodone Other (See Comments)   Unsteady gait,  disoriented      Medication List    TAKE these medications   acetaminophen 500 MG tablet Commonly known as: TYLENOL Take 500 mg by mouth every 6 (six) hours as needed (pain.).   albuterol 108 (90 Base) MCG/ACT inhaler Commonly known as: VENTOLIN HFA Inhale 2 puffs into the lungs every 6 (six) hours as needed for wheezing.   ALPRAZolam 0.25 MG tablet Commonly known as: XANAX Take 1 tablet (0.25 mg total) by mouth daily as needed for anxiety.   clopidogrel 75 MG tablet Commonly known as: Plavix Take 1 tablet (75 mg total) by mouth daily.   collagenase ointment Commonly known as: SANTYL Apply topically daily.   DULoxetine 60 MG capsule Commonly known as: CYMBALTA Take 60 mg by mouth daily.   feeding supplement (PRO-STAT SUGAR FREE 64) Liqd Take 30 mLs by mouth 3 (three) times daily.   furosemide 40 MG tablet Commonly known as: LASIX Take 1 tablet (40 mg total) by mouth 2 (two) times daily.   gabapentin 100 MG capsule Commonly known as: NEURONTIN Take 100 mg by mouth 3 (three) times daily. (0900, 1400, & 2100)   Gerhardt's butt cream Crea As needed What changed:   how much to take  how to take this  when to take this  reasons to take this  additional instructions  glucose monitoring kit monitoring kit 1 each by Does not apply route as needed for other. Test blood sugar 3 times daily   insulin glargine 100 UNIT/ML injection Commonly known as: Lantus Inject 0.25 mLs (25 Units total) into the skin at bedtime. What changed: additional instructions   insulin lispro 100 UNIT/ML injection Commonly known as: HUMALOG Inject 2-10 Units into the skin 4 (four) times daily -  before meals and at bedtime. Sliding Scale Insulin: 200-250=2 units, 251-300=4 units, 301-350=6 units, 351-400=8 units, 400=10 units.   magic mouthwash w/lidocaine Soln Take 5 mLs by mouth 3 (three) times daily as needed for mouth pain.   megestrol 400 MG/10ML suspension Commonly known as:  MEGACE Take 5 mLs (200 mg total) by mouth 2 (two) times daily.   metoprolol tartrate 25 MG tablet Commonly known as: LOPRESSOR Take 0.5 tablets (12.5 mg total) by mouth 2 (two) times daily.   multivitamin with minerals Tabs tablet Take 1 tablet by mouth daily.   nystatin powder Commonly known as: MYCOSTATIN/NYSTOP Apply topically 3 (three) times daily.   pantoprazole 40 MG tablet Commonly known as: PROTONIX Take 1 tablet (40 mg total) by mouth every 12 (twelve) hours.   pravastatin 20 MG tablet Commonly known as: PRAVACHOL Take 1 tablet (20 mg total) by mouth daily.            Discharge Care Instructions  (From admission, onward)         Start     Ordered   10/13/20 0000  Touch down weight bearing        10/13/20 1205          Diagnostic Studies: PERIPHERAL VASCULAR CATHETERIZATION  Result Date: 09/27/2020 Patient name: Yvonne [name]  MRN: [id number]        DOB: 02/05/1946        Sex: female  09/27/2020 Pre-operative Diagnosis: Nonhealing ulcer of left metatarsal head Post-operative diagnosis:  Same Surgeon:  Marty Heck, MD Procedure Performed: 1.  Ultrasound guided access of right common femoral artery 2.  Aortogram including catheter selection of aorta 3.  Bilateral lower extremity arteriogram with selection of third order branches in the left lower extremity 4.  Left SFA angioplasty with stent placement x3 for chronic total occlusion (6 mm x 120 mm Eluvia, 6 mm x 100 mm Eluvia, and 6 mm x 80 mm Eluvia) 5.  Left peroneal angioplasty (3 mm x 40 mm Sterling) 6.  Mynx closure of the right common femoral artery  Indications: Patient is a 74 year old female who was seen in clinic last week with a likely malperforans ulcer of the left metatarsal head that had been nonhealing.  She had noted left SFA occlusion with abnormal waveforms at the ankle.  She presents today for left lower extremity arteriogram, possible intervention after risk benefits discussed.  Findings:   Aortogram showed no flow-limiting stenosis in the aortoiliac segment.  The left main renal and an accessory right renal were visualized.  Left lower extremity arteriogram showed a patent common femoral with two profunda branches.  She had a short stump of SFA proximally and then a long segment SFA occlusion with distal reconstitution of the SFA just before Hunter's canal.  The above and below-knee popliteal artery were widely patent with two-vessel runoff in the anterior tibial and peroneal artery.  Right lower extremity arteriogram shows a patent common femoral and profunda.  Her SFA was patent except for a focal mid high-grade mid SFA stenosis.  The above and below-knee popliteal  artery was patent.  Three-vessel runoff.  Ultimately the left SFA chronic total occlusion was crossed antegrade.  This was predilated with a 4 mm Mustang and then primarily stented with a 6 mm Eluvia x3 as noted above.  Stents were postdilated with a 5 mm Mustang.  Final injection showed inline flow through the SFA stents with no residual stenosis and widely patent stents.  There did appear to be a lesion in the left peroneal artery after stent placement and this may have been related to atheroembolism after crossing the chronic total occlusion.  This was treated with a 3 mm Sterling baloon angioplasty with complete resolution and preserved two vessel runoff.             Procedure:  The patient was identified in the holding area and taken to room 8.  The patient was then placed supine on the table and prepped and draped in the usual sterile fashion.  A time out was called.  Ultrasound was used to evaluate the right common femoral artery.  It was patent .  A digital ultrasound image was acquired.  A micropuncture needle was used to access the right common femoral artery under ultrasound guidance.  An 018 wire was advanced without resistance and a micropuncture sheath was placed.  The 018 wire was removed and a benson wire was placed.   The micropuncture sheath was exchanged for a 5 french sheath.  An omniflush catheter was advanced over the wire to the level of L-1.  An abdominal angiogram was obtained.  Next, the Omni Flush catheter was pulled down and bilateral lower extremity runoff was obtained.  After evaluating images elected to intervene on the left SFA chronic total occlusion.  A glidewirse advantage was used to cross the aortic bifurcation with a Omni Flush catheter.  I parked my wire in the profunda to get more purchase.  A 6 French Ansell sheath was placed in the right groin over the aortic bifurcation into the left distal external iliac artery.  Patient was given 100 units/kg heparin.  I then used the Glidewire advantage with 035 quick cross catheter to cross her left SFA chronic total occlusion and reentered into the true lumen in the distal SFA.  We confirmed with hand-injection we were in the true lumen.   I predilated the chronic total occlusion with a 4 mm Mustang in order to get the stents to track.  My plan was primary stent.  I then deployed 6 mm x 120 mm, 6 mm x 100 mm and 6 mm x 80 mm drug coated Eluvia in the proximal mid to distal SFA.  All this was postdilated with a 5 mm Mustang.  We had excellent inline flow through the SFA stents with no residual stenosis after stent placement.  When I did shoot the runoff there did appear to be a focal lesion in the peroneal proximal to mid segment.  Given the initial runoff was difficult to interpret with her moving, I didn't know if this was potentially related to atheroembolism from crossing her chronic total occlusion.  Ultimately I crossed this with a V 18 wire and used a 3 mm x 40 mm Sterling to nominal pressure for 2 minutes.  There was no residual stenosis with preserved two-vessel runoff anterior tibial and peroneal.  That point in time wires and catheters were removed and exchanged for a short 6 French sheath in the right groin.  Mynx closure device was deployed in the right  common femoral  artery.  Taken to holding in stable condition.  Plan: Patient will be loaded on Plavix with dual anti-platelet therapy including 81 mg aspirin.  Will need a wet-to-dry dressing or any other dressing care per nursing at her wound care center.  Will arrange follow-up in 1 month.  Marty Heck, MD [facility] Office: [phone]  XR Foot 2 Views Left  Result Date: 10/05/2020 2 view radiographs of the left foot shows destructive osteomyelitis involving the entire MTP joint with pathologic fractures through the metatarsal head and proximal phalanx.   Patient benefited maximally from their hospital stay and there were no complications.     Disposition: Discharge disposition: 03-Skilled Nursing Facility      Discharge Instructions    Apply dressing   Complete by: As directed    Daily dressing change.  May cleanse wound with antibacterial soap and water then dry and apply new dressing dry dressing 4 x 4's and Ace   Call MD / Call 911   Complete by: As directed    If you experience chest pain or shortness of breath, CALL 911 and be transported to the hospital emergency room.  If you develope a fever above 101 F, pus (white drainage) or increased drainage or redness at the wound, or calf pain, call your surgeon's office.   Call MD / Call 911   Complete by: As directed    If you experience chest pain or shortness of breath, CALL 911 and be transported to the hospital emergency room.  If you develope a fever above 101 F, pus (white drainage) or increased drainage or redness at the wound, or calf pain, call your surgeon's office.   Constipation Prevention   Complete by: As directed    Drink plenty of fluids.  Prune juice may be helpful.  You may use a stool softener, such as Colace (over the counter) 100 mg twice a day.  Use MiraLax (over the counter) for constipation as needed.   Constipation Prevention   Complete by: As directed    Drink  plenty of fluids.  Prune juice may be helpful.  You may use a stool softener, such as Colace (over the counter) 100 mg twice a day.  Use MiraLax (over the counter) for constipation as needed.   Diet - low sodium heart healthy   Complete by: As directed    Diet - low sodium heart healthy   Complete by: As directed    Discharge instructions   Complete by: As directed    Touchdown weightbearing in post op shoe. May remove shoe when in bed. Keep dressing dry   Discharge instructions   Complete by: As directed    Touchdown weight bearing.   Increase activity slowly as tolerated   Complete by: As directed    Increase activity slowly as tolerated   Complete by: As directed    Post-op shoe   Complete by: As directed    Touch down weight bearing   Complete by: As directed       Follow-up Information    Suzan Slick, NP In 1 week.   Specialty: Orthopedic Surgery Contact information: [address] [place] [address] [phone]                Signed: Bevely Palmer Anitria Andon 10/17/2020, 7:43 AM

## 2020-10-17 NOTE — Plan of Care (Signed)
  Problem: Activity: Goal: Risk for activity intolerance will decrease Outcome: Progressing   Problem: Nutrition: Goal: Adequate nutrition will be maintained Outcome: Progressing   Problem: Pain Managment: Goal: General experience of comfort will improve Outcome: Progressing   Problem: Safety: Goal: Ability to remain free from injury will improve Outcome: Progressing   

## 2020-10-17 NOTE — TOC Progression Note (Signed)
Transition of Care [facility]) - Progression Note    Patient Details  Name: Yvonne [name] MRN: [id number] Date of Birth: Sep 20, 1946  [facility] CM/SW [place], [place] Phone Number: 10/17/2020, 3:30 PM  Clinical Narrative:     CSW reached out to [facility], no updates. CSW faxed pts covid card to facility.   Expected Discharge Plan: Assisted Living Barriers to Discharge: No SNF bed, Insurance Authorization  Expected Discharge Plan and Services Expected Discharge Plan: Assisted Living         Expected Discharge Date: 10/17/20                                     Social Determinants of Health (SDOH) Interventions    Readmission Risk Interventions No flowsheet data found.  Emeterio Reeve, Latanya Presser, Ali Molina Social Worker [phone]

## 2020-10-17 NOTE — Progress Notes (Addendum)
Is postop day 4 status post ray amputation.  She is lying in bed very comfortable.   Vital signs stable afebrile dressing is dry.  Will plan for discharge to assisted living versus SNF.  Covid testing has been ordered   Dressing was changed today.  Wound edges are well opposed minimal bleeding no necrosis no foul odor no cellulitis

## 2020-10-17 NOTE — Plan of Care (Signed)
Patient is alert and oriented x2. Disoriented to place and time. Patient keeps getting up on her feet despite her weight bearing restrictions. Patient educated but forgetful. Bed alarm in place. Dressing to left foot intact. Problem: Education: Goal: Knowledge of General Education information will improve Description: Including pain rating scale, medication(s)/side effects and non-pharmacologic comfort measures Outcome: Progressing   Problem: Health Behavior/Discharge Planning: Goal: Ability to manage health-related needs will improve Outcome: Progressing   Problem: Clinical Measurements: Goal: Ability to maintain clinical measurements within normal limits will improve Outcome: Progressing Goal: Will remain free from infection Outcome: Progressing Goal: Diagnostic test results will improve Outcome: Progressing Goal: Respiratory complications will improve Outcome: Progressing Goal: Cardiovascular complication will be avoided Outcome: Progressing   Problem: Activity: Goal: Risk for activity intolerance will decrease Outcome: Progressing   Problem: Nutrition: Goal: Adequate nutrition will be maintained Outcome: Progressing   Problem: Coping: Goal: Level of anxiety will decrease Outcome: Progressing   Problem: Elimination: Goal: Will not experience complications related to bowel motility Outcome: Progressing Goal: Will not experience complications related to urinary retention Outcome: Progressing   Problem: Pain Managment: Goal: General experience of comfort will improve Outcome: Progressing   Problem: Safety: Goal: Ability to remain free from injury will improve Outcome: Progressing   Problem: Skin Integrity: Goal: Risk for impaired skin integrity will decrease Outcome: Progressing

## 2020-10-18 ENCOUNTER — Encounter (HOSPITAL_BASED_OUTPATIENT_CLINIC_OR_DEPARTMENT_OTHER): Payer: Medicare Other | Admitting: Physician Assistant

## 2020-10-18 DIAGNOSIS — M86172 Other acute osteomyelitis, left ankle and foot: Secondary | ICD-10-CM | POA: Diagnosis not present

## 2020-10-18 LAB — GLUCOSE, CAPILLARY
Glucose-Capillary: 165 mg/dL — ABNORMAL HIGH (ref 70–99)
Glucose-Capillary: 150 mg/dL — ABNORMAL HIGH (ref 70–99)
Glucose-Capillary: 159 mg/dL — ABNORMAL HIGH (ref 70–99)

## 2020-10-18 NOTE — TOC Progression Note (Addendum)
Transition of Care [facility]) - Progression Note    Patient Details  Name: KENNADEE WALTHOUR MRN: [id number] Date of Birth: 1946-07-30  Transition [facility]) CM/SW Contact  Oren Section Cleta Alberts, RN Phone Number: 10/18/2020, 2:10 PM  Clinical Narrative:   Damaris Schooner with Jenny Reichmann, Care Coordinator at [facility]: she states that they will not be able to accept pt due to her care needs.    Addendum: Spoke with patient's daughter-in-law, [name]: Notified her of [facility] house denial.  She is disappointed about this; we discussed other SNF bed offers, but she is not interested in these.  She is interested in [facility]; updated [place] and faxed out to additional facilities per her request.  Will provide updates as they are available.    Expected Discharge Plan: Assisted Living Barriers to Discharge: No SNF bed, Insurance Authorization  Expected Discharge Plan and Services Expected Discharge Plan: Assisted Living         Expected Discharge Date: 10/17/20                                     Social Determinants of Health (SDOH) Interventions    Readmission Risk Interventions No flowsheet data found.  Reinaldo Raddle, RN, BSN  Trauma/Neuro ICU Case Manager [phone]
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
## 2020-10-18 NOTE — Plan of Care (Signed)
°  Problem: Clinical Measurements: Goal: Ability to maintain clinical measurements within normal limits will improve Outcome: Progressing   Problem: Activity: Goal: Risk for activity intolerance will decrease Outcome: Progressing   Problem: Nutrition: Goal: Adequate nutrition will be maintained Outcome: Progressing   Problem: Coping: Goal: Level of anxiety will decrease Outcome: Progressing   Problem: Pain Managment: Goal: General experience of comfort will improve Outcome: Progressing   Problem: Skin Integrity: Goal: Risk for impaired skin integrity will decrease Outcome: Progressing   

## 2020-10-19 DIAGNOSIS — M86172 Other acute osteomyelitis, left ankle and foot: Secondary | ICD-10-CM | POA: Diagnosis not present

## 2020-10-19 LAB — GLUCOSE, CAPILLARY
Glucose-Capillary: 165 mg/dL — ABNORMAL HIGH (ref 70–99)
Glucose-Capillary: 230 mg/dL — ABNORMAL HIGH (ref 70–99)
Glucose-Capillary: 150 mg/dL — ABNORMAL HIGH (ref 70–99)
Glucose-Capillary: 189 mg/dL — ABNORMAL HIGH (ref 70–99)

## 2020-10-19 NOTE — Progress Notes (Signed)
Patient ID: Yvonne [name], female   DOB: 1946-05-19, 74 y.o.   MRN: [id number] Patient without complaints this morning.  Patient does not want to go to a skilled nursing facility patient's family does not want her to go to a skilled nursing facility however physical therapy and assisted living facilities feel that she needs a skilled nursing facility.  There seems to be a disconnect between the family desires and the patient's ability.  Social work is currently Training and development officer farm.

## 2020-10-19 NOTE — Plan of Care (Signed)
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
## 2020-10-20 DIAGNOSIS — M86172 Other acute osteomyelitis, left ankle and foot: Secondary | ICD-10-CM | POA: Diagnosis not present

## 2020-10-20 LAB — GLUCOSE, CAPILLARY
Glucose-Capillary: 167 mg/dL — ABNORMAL HIGH (ref 70–99)
Glucose-Capillary: 208 mg/dL — ABNORMAL HIGH (ref 70–99)
Glucose-Capillary: 383 mg/dL — ABNORMAL HIGH (ref 70–99)
Glucose-Capillary: 133 mg/dL — ABNORMAL HIGH (ref 70–99)

## 2020-10-20 NOTE — Plan of Care (Signed)
  Problem: Education: Goal: Knowledge of General Education information will improve Description: Including pain rating scale, medication(s)/side effects and non-pharmacologic comfort measures Outcome: Progressing   Problem: Health Behavior/Discharge Planning: Goal: Ability to manage health-related needs will improve Outcome: Progressing   Problem: Activity: Goal: Risk for activity intolerance will decrease Outcome: Progressing   Problem: Elimination: Goal: Will not experience complications related to bowel motility Outcome: Progressing   Problem: Safety: Goal: Ability to remain free from injury will improve Outcome: Progressing

## 2020-10-20 NOTE — TOC Progression Note (Addendum)
Transition of Care [facility]) - Progression Note    Patient Details  Name: Yvonne [name] MRN: [id number] Date of Birth: 05-12-1946  Transition of Care [facility]) CM/SW [place], RN Phone Number: 10/20/2020, 10:12 AM  Clinical Narrative:    Case management spoke with the patient's daughter, Yvonne [name] and gave her Medicare choice regarding SNF facilities and the daughter did not want the patient transferred to [facility].  The patient's daughter was given a choice of the accepting facilities and was not open to patient's transfer to [facility] nor [facility] as well.  [facility] is not accepting patient's this week due to COVID issues at the facility.  The patient's daughter states that she would be open to patient transferring to [facility].  I called and spoke with [name], CM at [facility] and she states that she will look at the patient's care needs before she could make a bed offer to the patient.  Case management will continue to explore admission bed for the patient to an offering SNF facility.  10/20/2020 1542 - Called and spoke with [name], CM at [facility] and they are unable to extend a bed offer to the patient.  I called and spoke with Yvonne, patient's daughter and asked that she choose between the available facilities for the patient.  The daughter, Yvonne - is Office manager to arrange a tour of the facility this afternoon - so that I can update [employer] with the daughter's choice.  Will continue to reach out to the family for choice for SNF.   Expected Discharge Plan: Assisted Living Barriers to Discharge: No SNF bed, Insurance Authorization  Expected Discharge Plan and Services Expected Discharge Plan: Assisted Living         Expected Discharge Date: 10/17/20                                     Social Determinants of Health (SDOH) Interventions    Readmission Risk  Interventions No flowsheet data found.

## 2020-10-21 DIAGNOSIS — M86172 Other acute osteomyelitis, left ankle and foot: Secondary | ICD-10-CM | POA: Diagnosis not present

## 2020-10-21 LAB — GLUCOSE, CAPILLARY
Glucose-Capillary: 144 mg/dL — ABNORMAL HIGH (ref 70–99)
Glucose-Capillary: 153 mg/dL — ABNORMAL HIGH (ref 70–99)
Glucose-Capillary: 172 mg/dL — ABNORMAL HIGH (ref 70–99)
Glucose-Capillary: 196 mg/dL — ABNORMAL HIGH (ref 70–99)

## 2020-10-21 NOTE — Progress Notes (Signed)
Patient ID: Yvonne [name], female   DOB: 13-Jun-1946, 74 y.o.   MRN: [id number] Patient is seen this morning standing in her room, she has no complaints.  Patient has had a complex path for discharge to skilled nursing.  Family still in the process of determining a facility that is agreeable to them.  Will discharge once a facility is identified that the family is agreeable to.

## 2020-10-22 DIAGNOSIS — M86172 Other acute osteomyelitis, left ankle and foot: Secondary | ICD-10-CM | POA: Diagnosis not present

## 2020-10-22 LAB — GLUCOSE, CAPILLARY
Glucose-Capillary: 188 mg/dL — ABNORMAL HIGH (ref 70–99)
Glucose-Capillary: 213 mg/dL — ABNORMAL HIGH (ref 70–99)
Glucose-Capillary: 86 mg/dL (ref 70–99)
Glucose-Capillary: 118 mg/dL — ABNORMAL HIGH (ref 70–99)

## 2020-10-22 NOTE — TOC Progression Note (Signed)
Transition of Care [facility]) - Progression Note    Patient Details  Name: Yvonne [name] MRN: [id number] Date of Birth: 1946/04/15  Transition of Care [facility]) CM/SW [place], [place] Phone Number: 10/22/2020, 10:23 AM  Clinical Narrative:     CSW called [name] to inquire about bed choice. [name] states she is hesitant to make a decision for SNF. [name] stated she would like more information about [facility] [name] gave permission for Juliann Pulse at [facility] to call and discuss [facility].   TOC to follow.   Expected Discharge Plan: Assisted Living Barriers to Discharge: No SNF bed, Insurance Authorization  Expected Discharge Plan and Services Expected Discharge Plan: Assisted Living         Expected Discharge Date: 10/17/20                                     Social Determinants of Health (SDOH) Interventions    Readmission Risk Interventions No flowsheet data found.  Emeterio Reeve, Latanya Presser, [place] Social Worker [phone]

## 2020-10-22 NOTE — Plan of Care (Signed)
  Problem: Education: Goal: Knowledge of General Education information will improve Description: Including pain rating scale, medication(s)/side effects and non-pharmacologic comfort measures Outcome: Progressing   Problem: Clinical Measurements: Goal: Will remain free from infection Outcome: Progressing   

## 2020-10-22 NOTE — Progress Notes (Signed)
Patient ID: Yvonne [name], female   DOB: 1946/09/18, 74 y.o.   MRN: [id number] Patient standing at bedside.  She has no complaints.  Discharge planning underway hopefully discharge on Monday to skilled nursing.

## 2020-10-22 NOTE — Plan of Care (Signed)
Patient is alert and oriented x4. Forgetful, high fall risk. Gets up in wheelchair and BSC. In no acute distress. LLE dressing intact without drainage.  Problem: Education: Goal: Knowledge of General Education information will improve Description: Including pain rating scale, medication(s)/side effects and non-pharmacologic comfort measures Outcome: Progressing   Problem: Health Behavior/Discharge Planning: Goal: Ability to manage health-related needs will improve Outcome: Progressing   Problem: Clinical Measurements: Goal: Ability to maintain clinical measurements within normal limits will improve Outcome: Progressing Goal: Will remain free from infection Outcome: Progressing Goal: Diagnostic test results will improve Outcome: Progressing Goal: Respiratory complications will improve Outcome: Progressing Goal: Cardiovascular complication will be avoided Outcome: Progressing   Problem: Activity: Goal: Risk for activity intolerance will decrease Outcome: Progressing   Problem: Nutrition: Goal: Adequate nutrition will be maintained Outcome: Progressing   Problem: Coping: Goal: Level of anxiety will decrease Outcome: Progressing   Problem: Elimination: Goal: Will not experience complications related to bowel motility Outcome: Progressing Goal: Will not experience complications related to urinary retention Outcome: Progressing   Problem: Pain Managment: Goal: General experience of comfort will improve Outcome: Progressing   Problem: Safety: Goal: Ability to remain free from injury will improve Outcome: Progressing   Problem: Skin Integrity: Goal: Risk for impaired skin integrity will decrease Outcome: Progressing

## 2020-10-23 DIAGNOSIS — M86172 Other acute osteomyelitis, left ankle and foot: Secondary | ICD-10-CM | POA: Diagnosis not present

## 2020-10-23 LAB — SARS CORONAVIRUS 2 BY RT PCR (HOSPITAL ORDER, PERFORMED IN ~~LOC~~ HOSPITAL LAB): SARS Coronavirus 2: NEGATIVE

## 2020-10-23 LAB — GLUCOSE, CAPILLARY
Glucose-Capillary: 123 mg/dL — ABNORMAL HIGH (ref 70–99)
Glucose-Capillary: 182 mg/dL — ABNORMAL HIGH (ref 70–99)
Glucose-Capillary: 118 mg/dL — ABNORMAL HIGH (ref 70–99)
Glucose-Capillary: 231 mg/dL — ABNORMAL HIGH (ref 70–99)

## 2020-10-23 NOTE — Plan of Care (Signed)

## 2020-10-23 NOTE — Care Management Important Message (Signed)
Important Message  Patient Details  Name: Yvonne [name] MRN: [id number] Date of Birth: 06-04-1946   Medicare Important Message Given:  Yes     Delorse Lek 10/23/2020, 3:33 PM
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
## 2020-10-23 NOTE — TOC Progression Note (Signed)
Transition of Care [facility]) - Progression Note    Patient Details  Name: GAVRIELA CASHIN MRN: [id number] Date of Birth: 12-Nov-1946  Transition of Care [facility]) CM/SW [place], [place] Phone Number: 10/23/2020, 11:10 AM  Clinical Narrative:     Pt has been accepted to [facility], family accepted. CSW started insurance Welcome, reference number [id number].   TOC will follow.   Expected Discharge Plan: Assisted Living Barriers to Discharge: No SNF bed, Insurance Authorization  Expected Discharge Plan and Services Expected Discharge Plan: Assisted Living         Expected Discharge Date: 10/23/20                                     Social Determinants of Health (SDOH) Interventions    Readmission Risk Interventions No flowsheet data found.  Emeterio Reeve, Latanya Presser, [place] Social Worker [phone]

## 2020-10-23 NOTE — Social Work (Signed)
To Whom It May Concern:  Please be advised that the above-named patient will require a short-term nursing home stay - anticipated 30 days or less for rehabilitation and strengthening.  The plan is for return home.  

## 2020-10-23 NOTE — Progress Notes (Signed)
Patient ID: Yvonne [name], female   DOB: 1945-12-20, 74 y.o.   MRN: [id number] Patient without complaints this morning.  Plan for discharge to skilled nursing today.

## 2020-10-23 NOTE — Discharge Summary (Signed)
Discharge Diagnoses:  Active Problems:   Osteomyelitis of great toe of left foot (HCC)   Osteomyelitis ([place])   Acute hematogenous osteomyelitis of right foot ([place])   Surgeries: Procedure(s): LEFT FOOT 1ST RAY AMPUTATION on 10/13/2020    Consultants:   Discharged Condition: Improved  Hospital Course: Yvonne [name] is an 74 y.o. female who was admitted 10/13/2020 with a chief complaint of great toe osteomyelitis, with a final diagnosis of Chronic Osteomyelitis Left Great Toe.  Patient was brought to the operating room on 10/13/2020 and underwent Procedure(s): LEFT FOOT 1ST RAY AMPUTATION.    Patient was given perioperative antibiotics:  Anti-infectives (From admission, onward)   Start     Dose/Rate Route Frequency Ordered Stop   10/13/20 1730  ceFAZolin (ANCEF) IVPB 2g/100 mL premix        2 g 200 mL/hr over 30 Minutes Intravenous Every 6 hours 10/13/20 1529 10/14/20 0610   10/13/20 0920  ceFAZolin (ANCEF) 2-4 GM/100ML-% IVPB       Note to Pharmacy: Alvy Beal   : cabinet override      10/13/20 0920 10/13/20 2129   10/13/20 0915  ceFAZolin (ANCEF) IVPB 2g/100 mL premix  Status:  Discontinued        2 g 200 mL/hr over 30 Minutes Intravenous On call to O.R. 10/13/20 0910 10/13/20 1513    .  Patient was given sequential compression devices, early ambulation, and aspirin for DVT prophylaxis.  Recent vital signs:  Patient Vitals for the past 24 hrs:  BP Temp Temp src Pulse Resp SpO2  10/23/20 0700 (!) 141/64 98.6 F (37 C) Oral 65 16 98 %  10/22/20 1918 (!) 131/58 98.8 F (37.1 C) Oral 72 17 99 %  10/22/20 1729 (!) 141/65 97.6 F (36.4 C) Oral 84 17 100 %  .  Recent laboratory studies: No results found.  Discharge Medications:   Allergies as of 10/23/2020      Reactions   Clonazepam Other (See Comments)   Unsteady gait, ineffective   Trazodone And Nefazodone Other (See Comments)   Unsteady gait, disoriented      Medication List    TAKE these medications    acetaminophen 500 MG tablet Commonly known as: TYLENOL Take 500 mg by mouth every 6 (six) hours as needed (pain.).   albuterol 108 (90 Base) MCG/ACT inhaler Commonly known as: VENTOLIN HFA Inhale 2 puffs into the lungs every 6 (six) hours as needed for wheezing.   ALPRAZolam 0.25 MG tablet Commonly known as: XANAX Take 1 tablet (0.25 mg total) by mouth daily as needed for anxiety.   clopidogrel 75 MG tablet Commonly known as: Plavix Take 1 tablet (75 mg total) by mouth daily.   collagenase ointment Commonly known as: SANTYL Apply topically daily.   DULoxetine 60 MG capsule Commonly known as: CYMBALTA Take 60 mg by mouth daily.   feeding supplement (PRO-STAT SUGAR FREE 64) Liqd Take 30 mLs by mouth 3 (three) times daily.   furosemide 40 MG tablet Commonly known as: LASIX Take 1 tablet (40 mg total) by mouth 2 (two) times daily.   gabapentin 100 MG capsule Commonly known as: NEURONTIN Take 100 mg by mouth 3 (three) times daily. (0900, 1400, & 2100)   Gerhardt's butt cream Crea As needed What changed:   how much to take  how to take this  when to take this  reasons to take this  additional instructions   glucose monitoring kit monitoring kit 1 each by Does not apply  route as needed for other. Test blood sugar 3 times daily   insulin glargine 100 UNIT/ML injection Commonly known as: Lantus Inject 0.25 mLs (25 Units total) into the skin at bedtime. What changed: additional instructions   insulin lispro 100 UNIT/ML injection Commonly known as: HUMALOG Inject 2-10 Units into the skin 4 (four) times daily -  before meals and at bedtime. Sliding Scale Insulin: 200-250=2 units, 251-300=4 units, 301-350=6 units, 351-400=8 units, 400=10 units.   magic mouthwash w/lidocaine Soln Take 5 mLs by mouth 3 (three) times daily as needed for mouth pain.   megestrol 400 MG/10ML suspension Commonly known as: MEGACE Take 5 mLs (200 mg total) by mouth 2 (two) times daily.    metoprolol tartrate 25 MG tablet Commonly known as: LOPRESSOR Take 0.5 tablets (12.5 mg total) by mouth 2 (two) times daily.   multivitamin with minerals Tabs tablet Take 1 tablet by mouth daily.   nystatin powder Commonly known as: MYCOSTATIN/NYSTOP Apply topically 3 (three) times daily.   pantoprazole 40 MG tablet Commonly known as: PROTONIX Take 1 tablet (40 mg total) by mouth every 12 (twelve) hours.   pravastatin 20 MG tablet Commonly known as: PRAVACHOL Take 1 tablet (20 mg total) by mouth daily.            Discharge Care Instructions  (From admission, onward)         Start     Ordered   10/13/20 0000  Touch down weight bearing        10/13/20 1205          Diagnostic Studies: PERIPHERAL VASCULAR CATHETERIZATION  Result Date: 09/27/2020 Patient name: Yvonne [name]  MRN: [id number]        DOB: 1946-04-13        Sex: female  09/27/2020 Pre-operative Diagnosis: Nonhealing ulcer of left metatarsal head Post-operative diagnosis:  Same Surgeon:  Marty Heck, MD Procedure Performed: 1.  Ultrasound guided access of right common femoral artery 2.  Aortogram including catheter selection of aorta 3.  Bilateral lower extremity arteriogram with selection of third order branches in the left lower extremity 4.  Left SFA angioplasty with stent placement x3 for chronic total occlusion (6 mm x 120 mm Eluvia, 6 mm x 100 mm Eluvia, and 6 mm x 80 mm Eluvia) 5.  Left peroneal angioplasty (3 mm x 40 mm Sterling) 6.  Mynx closure of the right common femoral artery  Indications: Patient is a 74 year old female who was seen in clinic last week with a likely malperforans ulcer of the left metatarsal head that had been nonhealing.  She had noted left SFA occlusion with abnormal waveforms at the ankle.  She presents today for left lower extremity arteriogram, possible intervention after risk benefits discussed.  Findings:  Aortogram showed no flow-limiting stenosis in the aortoiliac  segment.  The left main renal and an accessory right renal were visualized.  Left lower extremity arteriogram showed a patent common femoral with two profunda branches.  She had a short stump of SFA proximally and then a long segment SFA occlusion with distal reconstitution of the SFA just before Hunter's canal.  The above and below-knee popliteal artery were widely patent with two-vessel runoff in the anterior tibial and peroneal artery.  Right lower extremity arteriogram shows a patent common femoral and profunda.  Her SFA was patent except for a focal mid high-grade mid SFA stenosis.  The above and below-knee popliteal artery was patent.  Three-vessel runoff.  Ultimately the left SFA  chronic total occlusion was crossed antegrade.  This was predilated with a 4 mm Mustang and then primarily stented with a 6 mm Eluvia x3 as noted above.  Stents were postdilated with a 5 mm Mustang.  Final injection showed inline flow through the SFA stents with no residual stenosis and widely patent stents.  There did appear to be a lesion in the left peroneal artery after stent placement and this may have been related to atheroembolism after crossing the chronic total occlusion.  This was treated with a 3 mm Sterling baloon angioplasty with complete resolution and preserved two vessel runoff.             Procedure:  The patient was identified in the holding area and taken to room 8.  The patient was then placed supine on the table and prepped and draped in the usual sterile fashion.  A time out was called.  Ultrasound was used to evaluate the right common femoral artery.  It was patent .  A digital ultrasound image was acquired.  A micropuncture needle was used to access the right common femoral artery under ultrasound guidance.  An 018 wire was advanced without resistance and a micropuncture sheath was placed.  The 018 wire was removed and a benson wire was placed.  The micropuncture sheath was exchanged for a 5 french sheath.   An omniflush catheter was advanced over the wire to the level of L-1.  An abdominal angiogram was obtained.  Next, the Omni Flush catheter was pulled down and bilateral lower extremity runoff was obtained.  After evaluating images elected to intervene on the left SFA chronic total occlusion.  A glidewirse advantage was used to cross the aortic bifurcation with a Omni Flush catheter.  I parked my wire in the profunda to get more purchase.  A 6 French Ansell sheath was placed in the right groin over the aortic bifurcation into the left distal external iliac artery.  Patient was given 100 units/kg heparin.  I then used the Glidewire advantage with 035 quick cross catheter to cross her left SFA chronic total occlusion and reentered into the true lumen in the distal SFA.  We confirmed with hand-injection we were in the true lumen.   I predilated the chronic total occlusion with a 4 mm Mustang in order to get the stents to track.  My plan was primary stent.  I then deployed 6 mm x 120 mm, 6 mm x 100 mm and 6 mm x 80 mm drug coated Eluvia in the proximal mid to distal SFA.  All this was postdilated with a 5 mm Mustang.  We had excellent inline flow through the SFA stents with no residual stenosis after stent placement.  When I did shoot the runoff there did appear to be a focal lesion in the peroneal proximal to mid segment.  Given the initial runoff was difficult to interpret with her moving, I didn't know if this was potentially related to atheroembolism from crossing her chronic total occlusion.  Ultimately I crossed this with a V 18 wire and used a 3 mm x 40 mm Sterling to nominal pressure for 2 minutes.  There was no residual stenosis with preserved two-vessel runoff anterior tibial and peroneal.  That point in time wires and catheters were removed and exchanged for a short 6 French sheath in the right groin.  Mynx closure device was deployed in the right common femoral artery.  Taken to holding in stable condition.   Plan: Patient  will be loaded on Plavix with dual anti-platelet therapy including 81 mg aspirin.  Will need a wet-to-dry dressing or any other dressing care per nursing at her wound care center.  Will arrange follow-up in 1 month.  Marty Heck, MD [facility] Office: [phone]  XR Foot 2 Views Left  Result Date: 10/05/2020 2 view radiographs of the left foot shows destructive osteomyelitis involving the entire MTP joint with pathologic fractures through the metatarsal head and proximal phalanx.   Patient benefited maximally from their hospital stay and there were no complications.     Disposition: Discharge disposition: 03-Skilled Nursing Facility      Discharge Instructions    Apply dressing   Complete by: As directed    Daily dressing change.  May cleanse wound with antibacterial soap and water then dry and apply new dressing dry dressing 4 x 4's and Ace   Call MD / Call 911   Complete by: As directed    If you experience chest pain or shortness of breath, CALL 911 and be transported to the hospital emergency room.  If you develope a fever above 101 F, pus (white drainage) or increased drainage or redness at the wound, or calf pain, call your surgeon's office.   Call MD / Call 911   Complete by: As directed    If you experience chest pain or shortness of breath, CALL 911 and be transported to the hospital emergency room.  If you develope a fever above 101 F, pus (white drainage) or increased drainage or redness at the wound, or calf pain, call your surgeon's office.   Call MD / Call 911   Complete by: As directed    If you experience chest pain or shortness of breath, CALL 911 and be transported to the hospital emergency room.  If you develope a fever above 101 F, pus (white drainage) or increased drainage or redness at the wound, or calf pain, call your surgeon's office.   Constipation Prevention   Complete by: As directed    Drink  plenty of fluids.  Prune juice may be helpful.  You may use a stool softener, such as Colace (over the counter) 100 mg twice a day.  Use MiraLax (over the counter) for constipation as needed.   Constipation Prevention   Complete by: As directed    Drink plenty of fluids.  Prune juice may be helpful.  You may use a stool softener, such as Colace (over the counter) 100 mg twice a day.  Use MiraLax (over the counter) for constipation as needed.   Constipation Prevention   Complete by: As directed    Drink plenty of fluids.  Prune juice may be helpful.  You may use a stool softener, such as Colace (over the counter) 100 mg twice a day.  Use MiraLax (over the counter) for constipation as needed.   Diet - low sodium heart healthy   Complete by: As directed    Diet - low sodium heart healthy   Complete by: As directed    Diet - low sodium heart healthy   Complete by: As directed    Discharge instructions   Complete by: As directed    Touchdown weightbearing in post op shoe. May remove shoe when in bed. Keep dressing dry   Discharge instructions   Complete by: As directed    Touchdown weight bearing.   Increase activity slowly as tolerated   Complete by: As directed  Increase activity slowly as tolerated   Complete by: As directed    Increase activity slowly as tolerated   Complete by: As directed    Post-op shoe   Complete by: As directed    Touch down weight bearing   Complete by: As directed       Follow-up Information    Suzan Slick, NP In 1 week.   Specialty: Orthopedic Surgery Contact information: [address] [place] [address] [phone]                Signed: Bevely Palmer Christapher Gillian 10/23/2020, 8:22 AM

## 2020-10-23 NOTE — Plan of Care (Signed)
Patient is s/p left foot first ray amputation. ACE wrap to left lower extremity C/D/I, warm, and WNL. Patient able to transfer to wheelchair and go to the bathroom with assistance. No complaints of pain at this time and discharge plan is for SNF - pending placement. Will continue to monitor and continue current POC.

## 2020-10-24 DIAGNOSIS — M86172 Other acute osteomyelitis, left ankle and foot: Secondary | ICD-10-CM | POA: Diagnosis not present

## 2020-10-24 LAB — GLUCOSE, CAPILLARY
Glucose-Capillary: 152 mg/dL — ABNORMAL HIGH (ref 70–99)
Glucose-Capillary: 126 mg/dL — ABNORMAL HIGH (ref 70–99)
Glucose-Capillary: 137 mg/dL — ABNORMAL HIGH (ref 70–99)
Glucose-Capillary: 178 mg/dL — ABNORMAL HIGH (ref 70–99)

## 2020-10-24 NOTE — Progress Notes (Signed)
Report given to [facility], staff nurse at [facility] questions and concerns were fully answered.

## 2020-10-24 NOTE — Discharge Summary (Signed)
Discharge Diagnoses:  Active Problems:   Osteomyelitis of great toe of left foot (HCC)   Osteomyelitis ([place])   Acute hematogenous osteomyelitis of right foot ([place])   Surgeries: Procedure(s): LEFT FOOT 1ST RAY AMPUTATION on 10/13/2020    Consultants:   Discharged Condition: Improved  Hospital Course: Yvonne [name] is an 74 y.o. female who was admitted 10/13/2020 with a chief complaint of left foot osteomyelitis, with a final diagnosis of Chronic Osteomyelitis Left Great Toe.  Patient was brought to the operating room on 10/13/2020 and underwent Procedure(s): LEFT FOOT 1ST RAY AMPUTATION.    Patient was given perioperative antibiotics:  Anti-infectives (From admission, onward)   Start     Dose/Rate Route Frequency Ordered Stop   10/13/20 1730  ceFAZolin (ANCEF) IVPB 2g/100 mL premix        2 g 200 mL/hr over 30 Minutes Intravenous Every 6 hours 10/13/20 1529 10/14/20 0610   10/13/20 0920  ceFAZolin (ANCEF) 2-4 GM/100ML-% IVPB       Note to Pharmacy: Alvy Beal   : cabinet override      10/13/20 0920 10/13/20 2129   10/13/20 0915  ceFAZolin (ANCEF) IVPB 2g/100 mL premix  Status:  Discontinued        2 g 200 mL/hr over 30 Minutes Intravenous On call to O.R. 10/13/20 0910 10/13/20 1513    .  Patient was given sequential compression devices, early ambulation, and aspirin for DVT prophylaxis.  Recent vital signs:  Patient Vitals for the past 24 hrs:  BP Temp Temp src Pulse Resp SpO2  10/24/20 0300 (!) 123/54 98.3 F (36.8 C) Oral 71 16 100 %  10/23/20 1951 (!) 122/57 98.7 F (37.1 C) Oral 73 16 100 %  10/23/20 0700 (!) 141/64 98.6 F (37 C) Oral 65 16 98 %  .  Recent laboratory studies: No results found.  Discharge Medications:   Allergies as of 10/24/2020      Reactions   Clonazepam Other (See Comments)   Unsteady gait, ineffective   Trazodone And Nefazodone Other (See Comments)   Unsteady gait, disoriented      Medication List    TAKE these medications    acetaminophen 500 MG tablet Commonly known as: TYLENOL Take 500 mg by mouth every 6 (six) hours as needed (pain.).   albuterol 108 (90 Base) MCG/ACT inhaler Commonly known as: VENTOLIN HFA Inhale 2 puffs into the lungs every 6 (six) hours as needed for wheezing.   ALPRAZolam 0.25 MG tablet Commonly known as: XANAX Take 1 tablet (0.25 mg total) by mouth daily as needed for anxiety.   clopidogrel 75 MG tablet Commonly known as: Plavix Take 1 tablet (75 mg total) by mouth daily.   collagenase ointment Commonly known as: SANTYL Apply topically daily.   DULoxetine 60 MG capsule Commonly known as: CYMBALTA Take 60 mg by mouth daily.   feeding supplement (PRO-STAT SUGAR FREE 64) Liqd Take 30 mLs by mouth 3 (three) times daily.   furosemide 40 MG tablet Commonly known as: LASIX Take 1 tablet (40 mg total) by mouth 2 (two) times daily.   gabapentin 100 MG capsule Commonly known as: NEURONTIN Take 100 mg by mouth 3 (three) times daily. (0900, 1400, & 2100)   Gerhardt's butt cream Crea As needed What changed:   how much to take  how to take this  when to take this  reasons to take this  additional instructions   glucose monitoring kit monitoring kit 1 each by Does not apply  route as needed for other. Test blood sugar 3 times daily   insulin glargine 100 UNIT/ML injection Commonly known as: Lantus Inject 0.25 mLs (25 Units total) into the skin at bedtime. What changed: additional instructions   insulin lispro 100 UNIT/ML injection Commonly known as: HUMALOG Inject 2-10 Units into the skin 4 (four) times daily -  before meals and at bedtime. Sliding Scale Insulin: 200-250=2 units, 251-300=4 units, 301-350=6 units, 351-400=8 units, 400=10 units.   magic mouthwash w/lidocaine Soln Take 5 mLs by mouth 3 (three) times daily as needed for mouth pain.   megestrol 400 MG/10ML suspension Commonly known as: MEGACE Take 5 mLs (200 mg total) by mouth 2 (two) times daily.    metoprolol tartrate 25 MG tablet Commonly known as: LOPRESSOR Take 0.5 tablets (12.5 mg total) by mouth 2 (two) times daily.   multivitamin with minerals Tabs tablet Take 1 tablet by mouth daily.   nystatin powder Commonly known as: MYCOSTATIN/NYSTOP Apply topically 3 (three) times daily.   pantoprazole 40 MG tablet Commonly known as: PROTONIX Take 1 tablet (40 mg total) by mouth every 12 (twelve) hours.   pravastatin 20 MG tablet Commonly known as: PRAVACHOL Take 1 tablet (20 mg total) by mouth daily.            Discharge Care Instructions  (From admission, onward)         Start     Ordered   10/13/20 0000  Touch down weight bearing        10/13/20 1205          Diagnostic Studies: PERIPHERAL VASCULAR CATHETERIZATION  Result Date: 09/27/2020 Patient name: Yvonne [name]  MRN: [id number]        DOB: 1946-04-13        Sex: female  09/27/2020 Pre-operative Diagnosis: Nonhealing ulcer of left metatarsal head Post-operative diagnosis:  Same Surgeon:  Marty Heck, MD Procedure Performed: 1.  Ultrasound guided access of right common femoral artery 2.  Aortogram including catheter selection of aorta 3.  Bilateral lower extremity arteriogram with selection of third order branches in the left lower extremity 4.  Left SFA angioplasty with stent placement x3 for chronic total occlusion (6 mm x 120 mm Eluvia, 6 mm x 100 mm Eluvia, and 6 mm x 80 mm Eluvia) 5.  Left peroneal angioplasty (3 mm x 40 mm Sterling) 6.  Mynx closure of the right common femoral artery  Indications: Patient is a 74 year old female who was seen in clinic last week with a likely malperforans ulcer of the left metatarsal head that had been nonhealing.  She had noted left SFA occlusion with abnormal waveforms at the ankle.  She presents today for left lower extremity arteriogram, possible intervention after risk benefits discussed.  Findings:  Aortogram showed no flow-limiting stenosis in the aortoiliac  segment.  The left main renal and an accessory right renal were visualized.  Left lower extremity arteriogram showed a patent common femoral with two profunda branches.  She had a short stump of SFA proximally and then a long segment SFA occlusion with distal reconstitution of the SFA just before Hunter's canal.  The above and below-knee popliteal artery were widely patent with two-vessel runoff in the anterior tibial and peroneal artery.  Right lower extremity arteriogram shows a patent common femoral and profunda.  Her SFA was patent except for a focal mid high-grade mid SFA stenosis.  The above and below-knee popliteal artery was patent.  Three-vessel runoff.  Ultimately the left SFA  chronic total occlusion was crossed antegrade.  This was predilated with a 4 mm Mustang and then primarily stented with a 6 mm Eluvia x3 as noted above.  Stents were postdilated with a 5 mm Mustang.  Final injection showed inline flow through the SFA stents with no residual stenosis and widely patent stents.  There did appear to be a lesion in the left peroneal artery after stent placement and this may have been related to atheroembolism after crossing the chronic total occlusion.  This was treated with a 3 mm Sterling baloon angioplasty with complete resolution and preserved two vessel runoff.             Procedure:  The patient was identified in the holding area and taken to room 8.  The patient was then placed supine on the table and prepped and draped in the usual sterile fashion.  A time out was called.  Ultrasound was used to evaluate the right common femoral artery.  It was patent .  A digital ultrasound image was acquired.  A micropuncture needle was used to access the right common femoral artery under ultrasound guidance.  An 018 wire was advanced without resistance and a micropuncture sheath was placed.  The 018 wire was removed and a benson wire was placed.  The micropuncture sheath was exchanged for a 5 french sheath.   An omniflush catheter was advanced over the wire to the level of L-1.  An abdominal angiogram was obtained.  Next, the Omni Flush catheter was pulled down and bilateral lower extremity runoff was obtained.  After evaluating images elected to intervene on the left SFA chronic total occlusion.  A glidewirse advantage was used to cross the aortic bifurcation with a Omni Flush catheter.  I parked my wire in the profunda to get more purchase.  A 6 French Ansell sheath was placed in the right groin over the aortic bifurcation into the left distal external iliac artery.  Patient was given 100 units/kg heparin.  I then used the Glidewire advantage with 035 quick cross catheter to cross her left SFA chronic total occlusion and reentered into the true lumen in the distal SFA.  We confirmed with hand-injection we were in the true lumen.   I predilated the chronic total occlusion with a 4 mm Mustang in order to get the stents to track.  My plan was primary stent.  I then deployed 6 mm x 120 mm, 6 mm x 100 mm and 6 mm x 80 mm drug coated Eluvia in the proximal mid to distal SFA.  All this was postdilated with a 5 mm Mustang.  We had excellent inline flow through the SFA stents with no residual stenosis after stent placement.  When I did shoot the runoff there did appear to be a focal lesion in the peroneal proximal to mid segment.  Given the initial runoff was difficult to interpret with her moving, I didn't know if this was potentially related to atheroembolism from crossing her chronic total occlusion.  Ultimately I crossed this with a V 18 wire and used a 3 mm x 40 mm Sterling to nominal pressure for 2 minutes.  There was no residual stenosis with preserved two-vessel runoff anterior tibial and peroneal.  That point in time wires and catheters were removed and exchanged for a short 6 French sheath in the right groin.  Mynx closure device was deployed in the right common femoral artery.  Taken to holding in stable condition.   Plan: Patient  will be loaded on Plavix with dual anti-platelet therapy including 81 mg aspirin.  Will need a wet-to-dry dressing or any other dressing care per nursing at her wound care center.  Will arrange follow-up in 1 month.  Marty Heck, MD [facility] Office: [phone]  XR Foot 2 Views Left  Result Date: 10/05/2020 2 view radiographs of the left foot shows destructive osteomyelitis involving the entire MTP joint with pathologic fractures through the metatarsal head and proximal phalanx.   Patient benefited maximally from their hospital stay and there were no complications.     Disposition: Discharge disposition: 03-Skilled Nursing Facility      Discharge Instructions    Apply dressing   Complete by: As directed    Daily dressing change.  May cleanse wound with antibacterial soap and water then dry and apply new dressing dry dressing 4 x 4's and Ace   Call MD / Call 911   Complete by: As directed    If you experience chest pain or shortness of breath, CALL 911 and be transported to the hospital emergency room.  If you develope a fever above 101 F, pus (white drainage) or increased drainage or redness at the wound, or calf pain, call your surgeon's office.   Call MD / Call 911   Complete by: As directed    If you experience chest pain or shortness of breath, CALL 911 and be transported to the hospital emergency room.  If you develope a fever above 101 F, pus (white drainage) or increased drainage or redness at the wound, or calf pain, call your surgeon's office.   Call MD / Call 911   Complete by: As directed    If you experience chest pain or shortness of breath, CALL 911 and be transported to the hospital emergency room.  If you develope a fever above 101 F, pus (white drainage) or increased drainage or redness at the wound, or calf pain, call your surgeon's office.   Call MD / Call 911   Complete by: As directed    If you  experience chest pain or shortness of breath, CALL 911 and be transported to the hospital emergency room.  If you develope a fever above 101 F, pus (white drainage) or increased drainage or redness at the wound, or calf pain, call your surgeon's office.   Constipation Prevention   Complete by: As directed    Drink plenty of fluids.  Prune juice may be helpful.  You may use a stool softener, such as Colace (over the counter) 100 mg twice a day.  Use MiraLax (over the counter) for constipation as needed.   Constipation Prevention   Complete by: As directed    Drink plenty of fluids.  Prune juice may be helpful.  You may use a stool softener, such as Colace (over the counter) 100 mg twice a day.  Use MiraLax (over the counter) for constipation as needed.   Constipation Prevention   Complete by: As directed    Drink plenty of fluids.  Prune juice may be helpful.  You may use a stool softener, such as Colace (over the counter) 100 mg twice a day.  Use MiraLax (over the counter) for constipation as needed.   Constipation Prevention   Complete by: As directed    Drink plenty of fluids.  Prune juice may be helpful.  You may use a stool softener, such as Colace (over the counter) 100 mg twice a day.  Use MiraLax (over the counter) for constipation as needed.   Diet - low sodium heart healthy   Complete by: As directed    Diet - low sodium heart healthy   Complete by: As directed    Diet - low sodium heart healthy   Complete by: As directed    Diet - low sodium heart healthy   Complete by: As directed    Discharge instructions   Complete by: As directed    Touchdown weightbearing in post op shoe. May remove shoe when in bed. Keep dressing dry   Discharge instructions   Complete by: As directed    Touchdown weight bearing.   Increase activity slowly as tolerated   Complete by: As directed    Increase activity slowly as tolerated   Complete by: As directed    Increase activity slowly as tolerated    Complete by: As directed    Increase activity slowly as tolerated   Complete by: As directed    Post-op shoe   Complete by: As directed    Touch down weight bearing   Complete by: As directed       Contact information for follow-up providers    Suzan Slick, NP In 1 week.   Specialty: Orthopedic Surgery Contact information: [place] [address] [phone]            Contact information for after-discharge care    Destination    [facility] Preferred SNF .   Service: Skilled Nursing Contact information: [address] [place] [phone]                   Signed: Bevely Palmer Cornelio Parkerson 10/24/2020, 6:54 AM

## 2020-10-24 NOTE — NC FL2 (Addendum)
[place] MEDICAID FL2 LEVEL OF CARE SCREENING TOOL     IDENTIFICATION  Patient Name: Yvonne [name] Birthdate: Jul 25, 1946 Sex: female Admission Date (Current Location): 10/13/2020  [facility] and [place] Number:  Herbalist and Address:  [facility] [place] [place] [address] [place], [place] [address]      Provider Number: [id number]  Attending Physician Name and Address:  [name], MD  Relative Name and Phone Number:  [name], [phone]    Current Level of Care: Hospital Recommended Level of Care: [place] Prior Approval Number:    Date Approved/Denied:   PASRR Number: pending  Discharge Plan: SNF    Current Diagnoses: Patient Active Problem List   Diagnosis Date Noted  . Osteomyelitis ([place]) 10/13/2020  . Acute hematogenous osteomyelitis of right foot ([place]) 10/13/2020  . Osteomyelitis of great toe of left foot ([place])   . PAD (peripheral artery disease) ([place]) 09/19/2020  . Palliative care by specialist   . Dysphagia   . Hematemesis with nausea   . DKA (diabetic ketoacidoses) 02/28/2020  . Altered mental status   . Dehydration   . Hypokalemia   . Diabetic neuropathy, painful ([place]) 07/20/2015  . Insomnia due to anxiety and fear 07/20/2015  . Anxiety disorder 10/03/2014  . Preventative health care 10/03/2014  . Type 2 diabetes mellitus without complication ([place]) 56/21/3086  . Depression 06/23/2014  . Essential hypertension 06/23/2014  . Peripheral neuropathic pain 06/23/2014  . Gastroesophageal reflux disease without esophagitis 06/23/2014  . Dyslipidemia 06/23/2014  . Chronic neck pain 03/09/2012  . Right arm pain 03/09/2012  . Chronic low back pain 03/09/2012  . Right knee pain 03/09/2012  . Joint stiffness of hand 03/09/2012  . Encephalopathy 02/05/2012  . Hypoxia 02/01/2012  . Fever 02/01/2012  . Leukocytosis 02/01/2012  . Bronchitis 02/01/2012  . Confusion 02/01/2012  . Diarrhea 02/01/2012  . COPD  (chronic obstructive pulmonary disease) ([place]) 02/01/2012  . CAP (community acquired pneumonia) 02/01/2012  . Diabetes mellitus ([place])     Orientation RESPIRATION BLADDER Height & Weight     Self  Normal Incontinent Weight: 203 lb 0.7 oz (92.1 kg) Height:  5\' 3"  (160 cm)  BEHAVIORAL SYMPTOMS/MOOD NEUROLOGICAL BOWEL NUTRITION STATUS      Continent Diet  AMBULATORY STATUS COMMUNICATION OF NEEDS Skin   Limited Assist Verbally Surgical wounds                       Personal Care Assistance Level of Assistance  Bathing, Feeding, Dressing Bathing Assistance: Limited assistance Feeding assistance: Limited assistance Dressing Assistance: Limited assistance     Functional Limitations Info  Sight, Hearing, Speech Sight Info: Adequate Hearing Info: Adequate Speech Info: Adequate    SPECIAL CARE FACTORS FREQUENCY  PT (By licensed PT), OT (By licensed OT)     PT Frequency: 5x a week OT Frequency: 5x a week            Contractures Contractures Info: Not present    Additional Factors Info  Code Status, Allergies, Psychotropic Code Status Info: Full Allergies Info: Clonazepam, Trazodone, nefazodone Psychotropic Info: Cymbalta         Current Medications (10/24/2020):  This is the current hospital active medication list Current Facility-Administered Medications  Medication Dose Route Frequency Provider Last Rate Last Admin  . 0.9 %  sodium chloride infusion   Intravenous Continuous Persons, Bevely Palmer, [place]      . acetaminophen (TYLENOL) tablet 325-650 mg  325-650  mg Oral Q6H PRN Persons, Bevely Palmer, [place]   650 mg at 10/15/20 1610  . albuterol (VENTOLIN HFA) 108 (90 Base) MCG/ACT inhaler 2 puff  2 puff Inhalation Q6H PRN Persons, Bevely Palmer, [place]      . Chlorhexidine Gluconate Cloth 2 % PADS 6 each  6 each Topical Daily [name], MD   6 each at 10/24/20 [phone]  . clopidogrel (PLAVIX) tablet 75 mg  75 mg Oral Daily Persons, Bevely Palmer, PA   75 mg at 10/24/20 0949  . docusate  sodium (COLACE) capsule 100 mg  100 mg Oral BID Persons, Bevely Palmer, PA   100 mg at 10/24/20 0949  . DULoxetine (CYMBALTA) DR capsule 60 mg  60 mg Oral Daily Persons, Bevely Palmer, [place]   60 mg at 10/24/20 0949  . furosemide (LASIX) tablet 40 mg  40 mg Oral BID Persons, Bevely Palmer, PA   40 mg at 10/24/20 0949  . HYDROmorphone (DILAUDID) injection 0.5 mg  0.5 mg Intravenous Q4H PRN Persons, Bevely Palmer, PA   0.5 mg at 10/13/20 2308  . insulin aspart (novoLOG) injection 0-15 Units  0-15 Units Subcutaneous TID WC Persons, Bevely Palmer, [place]   3 Units at 10/24/20 0820  . insulin glargine (LANTUS) injection 20 Units  20 Units Subcutaneous QHS Persons, Bevely Palmer, [place]   20 Units at 10/23/20 2205  . megestrol (MEGACE) 400 MG/10ML suspension 200 mg  200 mg Oral BID Persons, Bevely Palmer, PA   200 mg at 10/24/20 0949  . metoCLOPramide (REGLAN) tablet 5-10 mg  5-10 mg Oral Q8H PRN Persons, Bevely Palmer, PA       Or  . metoCLOPramide (REGLAN) injection 5-10 mg  5-10 mg Intravenous Q8H PRN Persons, Bevely Palmer, PA      . metoprolol tartrate (LOPRESSOR) tablet 12.5 mg  12.5 mg Oral BID Persons, Bevely Palmer, PA   12.5 mg at 10/24/20 0949  . nystatin (MYCOSTATIN/NYSTOP) topical powder   Topical TID [name], MD   Given at 10/24/20 [phone]  . ondansetron (ZOFRAN) tablet 4 mg  4 mg Oral Q6H PRN Persons, Bevely Palmer, PA       Or  . ondansetron [facility]) injection 4 mg  4 mg Intravenous Q6H PRN Persons, Bevely Palmer, [place]      . oxyCODONE (Oxy IR/ROXICODONE) immediate release tablet 5-10 mg  5-10 mg Oral Q4H PRN Persons, Bevely Palmer, PA   5 mg at 10/16/20 1811  . pantoprazole (PROTONIX) EC tablet 40 mg  40 mg Oral Q12H Persons, Bevely Palmer, [place]   40 mg at 10/24/20 0948  . pravastatin (PRAVACHOL) tablet 20 mg  20 mg Oral Daily Persons, Bevely Palmer, [place]   20 mg at 10/24/20 9381     Discharge Medications: Please see discharge summary for a list of discharge medications.  Relevant Imaging Results:  Relevant Lab Results:   Additional  Information [phone]  Emeterio Reeve, [place]

## 2020-10-24 NOTE — TOC Transition Note (Addendum)
Transition of Care [facility]) - CM/SW Discharge Note   Patient Details  Name: Yvonne [name] MRN: [id number] Date of Birth: 06/15/1946  [facility] CM/SW Contact:  Bethann Berkshire, [place] Phone Number: 10/24/2020, 1:46 PM   Clinical Narrative:     Patient will DC to: [facility] Anticipated DC date: 10/24/20 Family notified: Pt to notify family  Transport by: Corey Harold   Per MD patient ready for DC to [facility] . RN, patient, patient's family, and facility notified of DC. Discharge Summary and FL2 sent to facility. RN to call report prior to discharge [phone] Room 108). DC packet on chart. Ambulance transport requested for patient.   Family to pick up W/C  CSW will sign off for now as social work intervention is no longer needed. Please consult [name] again if new needs arise.   Final next level of care: Skilled Nursing Facility Barriers to Discharge: No Barriers Identified   Patient Goals and CMS Choice        Discharge Placement              Patient chooses bed at: [facility] Patient to be transferred to facility by: [facility]                                     Social Determinants of Health (SDOH) Interventions     Readmission Risk Interventions No flowsheet data found.

## 2020-10-24 NOTE — Plan of Care (Signed)
No acute changes since the previous night that I took care of her. Plan for discharge is to go to [facility] 11/30. Will continue to monitor and continue current POC.

## 2020-10-26 ENCOUNTER — Other Ambulatory Visit: Payer: Self-pay

## 2020-10-26 DIAGNOSIS — I739 Peripheral vascular disease, unspecified: Secondary | ICD-10-CM

## 2020-10-27 ENCOUNTER — Encounter: Payer: Self-pay | Admitting: Internal Medicine

## 2020-10-27 ENCOUNTER — Non-Acute Institutional Stay (SKILLED_NURSING_FACILITY): Payer: Medicare Other | Admitting: Internal Medicine

## 2020-10-27 DIAGNOSIS — M869 Osteomyelitis, unspecified: Secondary | ICD-10-CM | POA: Diagnosis not present

## 2020-10-27 DIAGNOSIS — E1140 Type 2 diabetes mellitus with diabetic neuropathy, unspecified: Secondary | ICD-10-CM

## 2020-10-27 DIAGNOSIS — R1319 Other dysphagia: Secondary | ICD-10-CM

## 2020-10-27 DIAGNOSIS — F0390 Unspecified dementia without behavioral disturbance: Secondary | ICD-10-CM | POA: Diagnosis not present

## 2020-10-27 DIAGNOSIS — J449 Chronic obstructive pulmonary disease, unspecified: Secondary | ICD-10-CM

## 2020-10-27 DIAGNOSIS — I739 Peripheral vascular disease, unspecified: Secondary | ICD-10-CM

## 2020-10-27 DIAGNOSIS — K219 Gastro-esophageal reflux disease without esophagitis: Secondary | ICD-10-CM

## 2020-10-27 DIAGNOSIS — Z789 Other specified health status: Secondary | ICD-10-CM

## 2020-10-27 DIAGNOSIS — I10 Essential (primary) hypertension: Secondary | ICD-10-CM

## 2020-10-27 DIAGNOSIS — S98112A Complete traumatic amputation of left great toe, initial encounter: Secondary | ICD-10-CM | POA: Diagnosis not present

## 2020-10-27 NOTE — Progress Notes (Signed)
Provider:  Rexene Edison. Mariea Clonts, D.O., C.M.D. Location:  [place] Room Number: [place] of Service:  SNF (31)  PCP: Harlan Stains, MD Patient Care Team: Harlan Stains, MD as PCP - General (Family Medicine) Harlan Stains, MD as Referring Physician [facility] Medicine)  Extended Emergency Contact Information Primary Emergency Contact: Charlynne Pander Home Phone: [phone] Mobile Phone: [phone] Relation: Relative Secondary Emergency Contact: Harlan Stains Address: [address]          [place],  [address] [place]nette [place] of [place] Phone: [phone] Mobile Phone: [phone] Relation: Relative  Code Status: FULL CODE Goals of Care: Advanced Directive information Advanced Directives 10/27/2020  Does Patient Have a Medical Advance Directive? No  Type of Advance Directive -  Would patient like information on creating a medical advance directive? No - Patient declined  Pre-existing out of facility DNR order (yellow form or pink MOST form) -      Chief Complaint  Patient presents with  . New Admit To SNF    New admit to SNF     HPI: Patient is a 74 y.o. female seen today for admission to his farm living and rehab status post hospitalization at [facility] from November 19 through November 30 with a left foot osteomyelitis.  Ms. Lodico has a history of peripheral arterial disease, type 2 diabetes with neuropathy and prior DKA, asthma, congestive heart failure, chronic kidney disease, COPD, dementia, dysphagia, GERD, chronic low back pain among others.  She had presented to the emergency department with complaints of swelling and drainage from an ulcer on the medial aspect of her left great toe.  She has a 15-pack-year smoking history but no longer smokes.  Seen by vascular surgery and determined that her chronic osteomyelitis would require first ray amputation.  She underwent this surgery on November 19 by Dr. Meridee Score.  She had  perioperative antibiotics with Ancef.  She was given sequential compression devices, early ambulation and aspirin for DVT prophylaxis.  She was discharged here for rehab with Tylenol for mild pain gabapentin 3 times a day for neuropathic pain, and Megace for appetite stimulation and weight gain.  When the orders were verified I discontinued the Megace as this is not indicated in geriatric patients with dementia and puts them at risk for deep vein thrombosis.  She is touchdown weightbearing while working with therapy.  Have daily dressing changes cleansing the wound with antibacterial soap and water drying and applying new dressing 4 x 4's and an Ace bandage.  She is to follow-up with Dondra Prader nurse practitioner with orthopedic surgery in 1 week--appt is 12/7.  When seen, she had no complaints except that she wanted to go back home and was wanting directions on how to get there and how to get transportation.  She was pleasantly confused.  She denied pain in her foot.    She has had her Moderna Covid vaccines on May 14 and June 11 of this year.  Past Medical History:  Diagnosis Date  . Asthma   . Blood transfusion   . Cervicalgia   . CHF (congestive heart failure) ([place])   . Chronic pain syndrome   . CKD (chronic kidney disease)   . COPD (chronic obstructive pulmonary disease) ([place])   . Dementia ([place])   . Diabetes mellitus   . Disturbance of skin sensation   . GERD (gastroesophageal reflux disease)   . Hyperlipidemia   . Hypertension   . Insomnia   .  Lumbago   . Olecranon bursitis   . Pain in joint, hand   . Polyneuropathy in diabetes(357.2)    Past Surgical History:  Procedure Laterality Date  . ABDOMINAL AORTOGRAM W/LOWER EXTREMITY N/A 09/27/2020   Procedure: ABDOMINAL AORTOGRAM W/LOWER EXTREMITY;  Surgeon: Marty Heck, MD;  Location: [facility];  Service: Cardiovascular;  Laterality: N/A;  . ABDOMINAL HYSTERECTOMY  1970's  . AMPUTATION Left 10/13/2020   Procedure:  LEFT FOOT 1ST RAY AMPUTATION;  Surgeon: [name], MD;  Location: [facility];  Service: Orthopedics;  Laterality: Left;  . BACK SURGERY     from bacteria  . BIOPSY  03/05/2020   Procedure: BIOPSY;  Surgeon: Yetta Flock, MD;  Location: [facility];  Service: Gastroenterology;;  . CATARACT EXTRACTION    . ESOPHAGOGASTRODUODENOSCOPY (EGD) WITH PROPOFOL N/A 03/05/2020   Procedure: ESOPHAGOGASTRODUODENOSCOPY (EGD) WITH PROPOFOL;  Surgeon: Yetta Flock, MD;  Location: [place];  Service: Gastroenterology;  Laterality: N/A;  . PERIPHERAL VASCULAR INTERVENTION Left 09/27/2020   Procedure: PERIPHERAL VASCULAR INTERVENTION;  Surgeon: Marty Heck, MD;  Location: [place] [facility];  Service: Cardiovascular;  Laterality: Left;  . SHOULDER ARTHROSCOPY Right     Social History   Socioeconomic History  . Marital status: Widowed    Spouse name: Not on file  . Number of children: 3  . Years of education: 78  . Highest education level: Not on file  Occupational History  . Not on file  Tobacco Use  . Smoking status: Former Smoker    Packs/day: 0.50    Years: 30.00    Pack years: 15.00    Types: Cigarettes  . Smokeless tobacco: Never Used  . Tobacco comment: was dx with COPD  Vaping Use  . Vaping Use: Never used  Substance and Sexual Activity  . Alcohol use: No  . Drug use: No  . Sexual activity: Not on file  Other Topics Concern  . Not on file  Social History Narrative   06/19/20 living at [facility]    Social Determinants of Health   Financial Resource Strain:   . Difficulty of Paying Living Expenses: Not on file  Food Insecurity:   . Worried About Charity fundraiser in the Last Year: Not on file  . Ran Out of Food in the Last Year: Not on file  Transportation Needs:   . Lack of Transportation (Medical): Not on file  . Lack of Transportation (Non-Medical): Not on file  Physical Activity:   . Days of Exercise per Week: Not on file  . Minutes of  Exercise per Session: Not on file  Stress:   . Feeling of Stress : Not on file  Social Connections:   . Frequency of Communication with Friends and Family: Not on file  . Frequency of Social Gatherings with Friends and Family: Not on file  . Attends Religious Services: Not on file  . Active Member of Clubs or Organizations: Not on file  . Attends Archivist Meetings: Not on file  . Marital Status: Not on file    reports that she has quit smoking. Her smoking use included cigarettes. She has a 15.00 pack-year smoking history. She has never used smokeless tobacco. She reports that she does not drink alcohol and does not use drugs.  Functional Status Survey:    History reviewed. No pertinent family history.  Health Maintenance  Topic Date Due  . FOOT EXAM  Never done  . OPHTHALMOLOGY EXAM  Never done  . COVID-19 Vaccine (1) Never done  . TETANUS/TDAP  Never done  . DEXA SCAN  Never done  . URINE MICROALBUMIN  10/27/2010  . PNA vac Low Risk Adult (2 of 2 - PPSV23) 10/04/2015  . INFLUENZA VACCINE  06/25/2020  . HEMOGLOBIN A1C  04/12/2021  . Hepatitis C Screening  Completed    Allergies  Allergen Reactions  . Clonazepam Other (See Comments)    Unsteady gait, ineffective  . Trazodone And Nefazodone Other (See Comments)    Unsteady gait, disoriented    Outpatient Encounter Medications as of 10/27/2020  Medication Sig  . acetaminophen (TYLENOL) 500 MG tablet Take 500 mg by mouth every 6 (six) hours as needed (pain.).  Marland Kitchen albuterol (PROVENTIL HFA;VENTOLIN HFA) 108 (90 BASE) MCG/ACT inhaler Inhale 2 puffs into the lungs every 6 (six) hours as needed for wheezing.  Marland Kitchen ALPRAZolam (XANAX) 0.25 MG tablet Take 1 tablet (0.25 mg total) by mouth daily as needed for anxiety.  . Amino Acids-Protein Hydrolys (FEEDING SUPPLEMENT, PRO-STAT SUGAR FREE 64,) LIQD Take 30 mLs by mouth 3 (three) times daily.  . clopidogrel (PLAVIX) 75 MG tablet Take 1 tablet (75 mg total) by mouth daily.  .  DULoxetine (CYMBALTA) 60 MG capsule Take 60 mg by mouth daily.  . furosemide (LASIX) 40 MG tablet Take 1 tablet (40 mg total) by mouth 2 (two) times daily.  Marland Kitchen gabapentin (NEURONTIN) 100 MG capsule Take 100 mg by mouth 3 (three) times daily. (0900, 1400, & 2100)  . insulin glargine (LANTUS) 100 UNIT/ML injection Inject 0.25 mLs (25 Units total) into the skin at bedtime.  . insulin lispro (HUMALOG) 100 UNIT/ML injection Inject 2-10 Units into the skin 4 (four) times daily -  before meals and at bedtime. Sliding Scale Insulin: 200-250=2 units, 251-300=4 units, 301-350=6 units, 351-400=8 units, 400=10 units.  . magic mouthwash w/lidocaine SOLN Take 5 mLs by mouth 3 (three) times daily as needed for mouth pain.  . metoprolol tartrate (LOPRESSOR) 25 MG tablet Take 0.5 tablets (12.5 mg total) by mouth 2 (two) times daily.  . Multiple Vitamin (MULTIVITAMIN WITH MINERALS) TABS tablet Take 1 tablet by mouth daily.  Marland Kitchen nystatin (MYCOSTATIN/NYSTOP) powder Apply topically 3 (three) times daily.  . pantoprazole (PROTONIX) 40 MG tablet Take 1 tablet (40 mg total) by mouth every 12 (twelve) hours.  . pravastatin (PRAVACHOL) 20 MG tablet Take 1 tablet (20 mg total) by mouth daily.  . [DISCONTINUED] collagenase (SANTYL) ointment Apply topically daily.  . [DISCONTINUED] glucose monitoring kit (FREESTYLE) monitoring kit 1 each by Does not apply route as needed for other. Test blood sugar 3 times daily  . [DISCONTINUED] Hydrocortisone (GERHARDT'S BUTT CREAM) CREA As needed (Patient taking differently: Apply 1 application topically as needed for irritation. )  . [DISCONTINUED] megestrol (MEGACE) 400 MG/10ML suspension Take 5 mLs (200 mg total) by mouth 2 (two) times daily.   No facility-administered encounter medications on file as of 10/27/2020.    Review of Systems  Constitutional: Negative for chills, fever and malaise/fatigue.  HENT: Negative for congestion and sore throat.   Eyes: Negative for blurred vision.    Respiratory: Negative for cough and shortness of breath.   Cardiovascular: Negative for chest pain, palpitations and leg swelling.  Gastrointestinal: Negative for abdominal pain and constipation.  Genitourinary: Negative for dysuria.  Musculoskeletal: Negative for falls and joint pain.  Skin:       Sutures in place from amputation  Neurological: Negative for dizziness and loss of consciousness.  Psychiatric/Behavioral: Positive for memory loss. Negative for depression. The patient is not nervous/anxious and does not have insomnia.     Vitals:   10/27/20 0933  BP: (!) 129/56  Pulse: 72  Temp: (!) 97.5 F (36.4 C)  Weight: 203 lb 6.4 oz (92.3 kg)  Height: 5' 3"  (1.6 m)   Body mass index is 36.03 kg/m. Physical Exam Vitals reviewed.  Constitutional:      General: She is not in acute distress.    Appearance: Normal appearance. She is obese. She is not toxic-appearing.  HENT:     Head: Normocephalic and atraumatic.     Right Ear: External ear normal.     Left Ear: External ear normal.     Nose: Nose normal.     Mouth/Throat:     Pharynx: Oropharynx is clear.  Eyes:     Conjunctiva/sclera: Conjunctivae normal.     Pupils: Pupils are equal, round, and reactive to light.  Cardiovascular:     Rate and Rhythm: Normal rate and regular rhythm.     Pulses: Normal pulses.     Heart sounds: Normal heart sounds.  Pulmonary:     Effort: Pulmonary effort is normal.     Breath sounds: Normal breath sounds. No wheezing, rhonchi or rales.  Abdominal:     General: Bowel sounds are normal.     Palpations: Abdomen is soft.     Tenderness: There is no abdominal tenderness. There is no guarding or rebound.  Musculoskeletal:        General: Normal range of motion.     Right lower leg: No edema.     Left lower leg: No edema.  Skin:    Comments: Erythema on dorsal aspect of wound with warmth, no tenderness (has neuropathy), no significant swelling, sutures in place  Neurological:      General: No focal deficit present.     Mental Status: She is alert. Mental status is at baseline.     Comments: To be using walker  Psychiatric:        Mood and Affect: Mood normal.     Comments: Very pleasant, but confused, disoriented to place and time     Labs reviewed: Basic Metabolic Panel: Recent Labs    03/10/20 0205 03/11/20 1326 03/12/20 0450 03/12/20 0450 03/13/20 0214 03/14/20 0300 03/17/20 0345 03/17/20 0345 03/18/20 0645 03/19/20 0825 03/23/20 0503 03/24/20 0650 03/25/20 0350 03/25/20 0350 04/03/20 2010 09/27/20 0758 10/13/20 1016  NA 132*   < > 128*   < > 131*   < > 130*   < > 129*   < > 132*   < > 132*   < > 136 140 139  K 3.3*   < > 3.2*   < > 3.2*   < > 5.2*   < > 4.6   < > 3.9   < > 3.9   < > 3.5 4.0 3.7  CL 95*   < > 91*   < > 93*   < > 100   < > 99   < > 99   < > 101   < > 102 100 103  CO2 25   < > 27   < > 27   < > 20*   < > 23   < > 25   < > 21*  --  23  --  25  GLUCOSE 185*   < > 309*   < > 327*   < > 170*   < >  257*   < > 161*   < > 81   < > 225* 142* 127*  BUN 8   < > 10   < > 11   < > 16   < > 17   < > 34*   < > 28*   < > 25* 32* 26*  CREATININE 0.94   < > 0.73   < > 0.78   < > 0.70   < > 0.68   < > 1.10*   < > 0.76   < > 0.78 0.90 0.95  CALCIUM 8.6*   < > 8.7*   < > 8.9   < > 9.4   < > 9.2   < > 9.7   < > 9.2  --  9.3  --  9.7  MG 1.7   < > 1.3*   < > 1.6*  --  1.5*  --  2.3  --  1.7  --   --   --   --   --   --   PHOS 2.6  --  1.6*  --  1.7*  --   --   --   --   --   --   --   --   --   --   --   --    < > = values in this interval not displayed.   Liver Function Tests: Recent Labs    03/20/20 0600 03/21/20 0659 03/22/20 0414  AST 29 39 19  ALT 21 22 19   ALKPHOS 74 65 74  BILITOT 0.5 1.2 0.3  PROT 5.4* 5.1* 5.9*  ALBUMIN 2.3* 2.3* 2.6*   Recent Labs    02/27/20 2043  LIPASE 15   Recent Labs    02/28/20 0351  AMMONIA 31   CBC: Recent Labs    03/03/20 0157 03/03/20 0157 03/04/20 0234 03/10/20 0205 03/13/20 0214  03/13/20 0214 04/03/20 2010 09/27/20 0758 10/13/20 1016  WBC 15.1*   < > 11.3*   < > 9.9  --  14.8*  --  12.3*  NEUTROABS 11.3*  --  7.1  --   --   --  10.0*  --   --   HGB 12.7   < > 12.4   < > 11.0*   < > 10.9* 12.9 10.7*  HCT 38.4   < > 37.6   < > 33.4*   < > 35.5* 38.0 35.0*  MCV 71.8*   < > 72.0*   < > 71.8*  --  80.0  --  70.4*  PLT 284   < > 274   < > 262  --  355  --  441*   < > = values in this interval not displayed.   Cardiac Enzymes: Recent Labs    02/28/20 0342  CKTOTAL 31*   BNP: Invalid input(s): POCBNP Lab Results  Component Value Date   HGBA1C 8.1 (H) 10/13/2020   Lab Results  Component Value Date   TSH 0.545 02/28/2020   Lab Results  Component Value Date   VITAMINB12 434 01/16/2009   Lab Results  Component Value Date   FOLATE  05/11/2008    19.6 (NOTE)  Reference Ranges        Deficient:       0.4 - 3.4 ng/mL        Indeterminate:   3.4 - 5.4 ng/mL        Normal:              >  5.4 ng/mL   Lab Results  Component Value Date   IRON 24 (L) 05/11/2008   TIBC 186 (L) 05/11/2008   FERRITIN 288 01/16/2009     Assessment/Plan 1. Osteomyelitis of great toe of left foot (HCC) -I'm concerned about the erythema and warmth of the dorsal aspect of her foot near her incision site -labs ordered:  Cbc, bmp, esr, crp -not on current abx -with her underlying dementia, we may need to reconsider her goals of care--she is not going to do well with multiple surgeries and readmissions -she was seen in the hospital by palliative care per the d/c summary but I'm unable to locate the note at this time  2. Amputation of left great toe ([place]) -f/u with orthopedics as planned 12/7 -labs as above and f/u after completed  3. Dementia without behavioral disturbance, unspecified dementia type ([place]) -remains disoriented -baseline not clear, but she is wanting to go home and needs directions today, very pleasant, able to focus on conversation and be redirected so not so sure  it's delirium  4. PAD (peripheral artery disease) (HCC) -underlying, cont secondary prevention with diabetes, bp and lipid control  5. Essential hypertension -bp is at goal, no dizziness, cont same regimen and monitor  6. Chronic obstructive pulmonary disease, unspecified COPD type (HCC) -cont prn albuterol HFA  7. Other dysphagia -ST eval and tx  8. Gastroesophageal reflux disease without esophagitis -cont protonix therapy  9. Diabetic neuropathy, painful ([place]) -underlying and affects her ability to recognize pain in her foot, unfortunately, monitor carefully with regular reassessment by wound care nurse and f/u with ortho, labs ordered for more info due to erythema at wound  10. Full code status - Full code upon admission here  Family/ staff Communication: d/w snf nurse  Labs/tests ordered:  Cbc, bmp, esr, crp next draw  Burns. Jamilet Ambroise, D.O. [facility] [address], [place] [address] Cell Phone (Mon-Fri 8am-5pm):  [phone] On Call:  [phone] & follow prompts after 5pm & weekends Office Phone:  [phone] Office Fax:  [phone]

## 2020-10-30 ENCOUNTER — Non-Acute Institutional Stay (SKILLED_NURSING_FACILITY): Payer: Medicare Other | Admitting: Orthopedic Surgery

## 2020-10-30 ENCOUNTER — Encounter: Payer: Self-pay | Admitting: Orthopedic Surgery

## 2020-10-30 DIAGNOSIS — M869 Osteomyelitis, unspecified: Secondary | ICD-10-CM | POA: Diagnosis not present

## 2020-10-30 LAB — POCT ERYTHROCYTE SEDIMENTATION RATE, NON-AUTOMATED: Sed Rate: 45

## 2020-10-30 LAB — CBC AND DIFFERENTIAL
HCT: 33 — AB (ref 36–46)
Hemoglobin: 10.3 — AB (ref 12.0–16.0)
Platelets: 364 (ref 150–399)
WBC: 22.3

## 2020-10-30 LAB — CBC: RBC: 4.81 (ref 3.87–5.11)

## 2020-10-30 NOTE — Progress Notes (Signed)
Location:    [facility] Room Number: 108/P Place of Service:  SNF [phone]) Provider:  Arafat Cocuzza Pecola Lawless, MD  Patient Care Team: Harlan Stains, MD as PCP - General (Family Medicine) Harlan Stains, MD as Referring Physician [facility])  Extended Emergency Contact Information Primary Emergency Contact: Alyla, Pietila Home Phone: [phone] Mobile Phone: [phone] Relation: Relative Secondary Emergency Contact: Harlan Stains Address: [address]          [place], [place] [address] Johnnette Litter of [place] Phone: [phone] Mobile Phone: [phone] Relation: Relative  Code Status: Full Code  Goals of care: Advanced Directive information Advanced Directives 10/30/2020  Does Patient Have a Medical Advance Directive? Yes  Type of Advance Directive -  Does patient want to make changes to medical advance directive? No - Patient declined  Would patient like information on creating a medical advance directive? -  Pre-existing out of facility DNR order (yellow form or pink MOST form) -     Chief Complaint  Patient presents with  . Acute Visit    Elevated white count    HPI:  Pt is a 74 y.o. female seen today for an acute visit for fever and elevated white blood cell count.   She is a resident of [facility], seen to day at bedside. Past Medical history includes: hypertension, peripheral artery disease, bronchitis, chronic obstructive pulmonary disease, GERD, dysphagia, osteomyelitis, and type 2 diabetes with neuropathy.   Facility nurse at bedside during examination.   On 10/13/20 she had surgery by Dr. Sharol Given for left great toe amputation due to osteomyelitis. She was discharged to [facility] for skilled nursing services. She was seen by facility provider, Dr. Mariea Clonts 12/3, orders for follow up blood work given to facility nurse.   Today, wbc is elevated to 23.2. She also has a fever of 101.2.  She is alert and orientated. Denies pain, but complains of feeling cold. Denies sore throat, shortness of breath, chest pain. 650 mg Tylenol given to patient at beside by facility nurse during encounter.   She is scheduled to have a two week follow up with Dr. Sharol Given tomorrow.    Past Medical History:  Diagnosis Date  . Asthma   . Blood transfusion   . Cervicalgia   . CHF (congestive heart failure) ([place])   . Chronic pain syndrome   . CKD (chronic kidney disease)   . COPD (chronic obstructive pulmonary disease) ([place])   . Dementia ([place])   . Diabetes mellitus   . Disturbance of skin sensation   . GERD (gastroesophageal reflux disease)   . Hyperlipidemia   . Hypertension   . Insomnia   . Lumbago   . Olecranon bursitis   . Pain in joint, hand   . Polyneuropathy in diabetes(357.2)    Past Surgical History:  Procedure Laterality Date  . ABDOMINAL AORTOGRAM W/LOWER EXTREMITY N/A 09/27/2020   Procedure: ABDOMINAL AORTOGRAM W/LOWER EXTREMITY;  Surgeon: Marty Heck, MD;  Location: [facility];  Service: Cardiovascular;  Laterality: N/A;  . ABDOMINAL HYSTERECTOMY  1970's  . AMPUTATION Left 10/13/2020   Procedure: LEFT FOOT 1ST RAY AMPUTATION;  Surgeon: [name], MD;  Location: [facility];  Service: Orthopedics;  Laterality: Left;  . BACK SURGERY     from bacteria  . BIOPSY  03/05/2020   Procedure: BIOPSY;  Surgeon: Yetta Flock, MD;  Location: [facility] ENDOSCOPY;  Service: Gastroenterology;;  . CATARACT EXTRACTION    .  ESOPHAGOGASTRODUODENOSCOPY (EGD) WITH PROPOFOL N/A 03/05/2020   Procedure: ESOPHAGOGASTRODUODENOSCOPY (EGD) WITH PROPOFOL;  Surgeon: Yetta Flock, MD;  Location: [facility];  Service: Gastroenterology;  Laterality: N/A;  . PERIPHERAL VASCULAR INTERVENTION Left 09/27/2020   Procedure: PERIPHERAL VASCULAR INTERVENTION;  Surgeon: Marty Heck, MD;  Location: [facility];  Service: Cardiovascular;  Laterality: Left;  . SHOULDER  ARTHROSCOPY Right     Allergies  Allergen Reactions  . Clonazepam Other (See Comments)    Unsteady gait, ineffective  . Trazodone And Nefazodone Other (See Comments)    Unsteady gait, disoriented    Allergies as of 10/30/2020      Reactions   Clonazepam Other (See Comments)   Unsteady gait, ineffective   Trazodone And Nefazodone Other (See Comments)   Unsteady gait, disoriented      Medication List       Accurate as of October 30, 2020  4:52 PM. If you have any questions, ask your nurse or doctor.        acetaminophen 500 MG tablet Commonly known as: TYLENOL Take 500 mg by mouth every 6 (six) hours as needed (pain.).   albuterol 108 (90 Base) MCG/ACT inhaler Commonly known as: VENTOLIN HFA Inhale 2 puffs into the lungs every 6 (six) hours as needed for wheezing.   ALPRAZolam 0.25 MG tablet Commonly known as: XANAX Take 1 tablet (0.25 mg total) by mouth daily as needed for anxiety.   clopidogrel 75 MG tablet Commonly known as: Plavix Take 1 tablet (75 mg total) by mouth daily.   DULoxetine 60 MG capsule Commonly known as: CYMBALTA Take 60 mg by mouth daily.   feeding supplement (PRO-STAT 64) Liqd Take 30 mLs by mouth daily. What changed: Another medication with the same name was removed. Continue taking this medication, and follow the directions you see here. Changed by: Yvonna Alanis, NP   furosemide 40 MG tablet Commonly known as: LASIX Take 1 tablet (40 mg total) by mouth 2 (two) times daily.   gabapentin 100 MG capsule Commonly known as: NEURONTIN Take 100 mg by mouth 3 (three) times daily. (0900, 1400, & 2100)   insulin glargine 100 UNIT/ML injection Commonly known as: Lantus Inject 0.25 mLs (25 Units total) into the skin at bedtime.   insulin lispro 100 UNIT/ML injection Commonly known as: HUMALOG Inject into the skin. FOR CBG 0-199=0U, 200-250=2U, 251-300=4U, 301-350=6U, 351-400=8U, CBG GREATER THAN 400=10U X1 WEEK   magic mouthwash w/lidocaine  Soln Take 5 mLs by mouth 3 (three) times daily as needed for mouth pain.   metoprolol tartrate 25 MG tablet Commonly known as: LOPRESSOR Take 0.5 tablets (12.5 mg total) by mouth 2 (two) times daily.   multivitamin with minerals Tabs tablet Take 1 tablet by mouth daily.   Nyamyc powder Generic drug: nystatin Apply 1 application topically 3 (three) times daily. APPLY TOPICALLY THREES TIMES DAILY   pantoprazole 40 MG tablet Commonly known as: PROTONIX Take 1 tablet (40 mg total) by mouth every 12 (twelve) hours.   pravastatin 20 MG tablet Commonly known as: PRAVACHOL Take 1 tablet (20 mg total) by mouth daily.       Review of Systems  Constitutional: Positive for chills and fever. Negative for activity change and appetite change.  Respiratory: Negative for cough and shortness of breath.   Cardiovascular: Positive for leg swelling. Negative for chest pain.  Gastrointestinal: Negative for abdominal pain and constipation.  Musculoskeletal:       Left great toe amputation  Neurological: Negative  for numbness.  Psychiatric/Behavioral: Negative for dysphoric mood. The patient is not nervous/anxious.     Immunization History  Administered Date(s) Administered  . Influenza Split 02/02/2012  . Influenza,inj,Quad PF,6+ Mos 10/03/2014  . PPD Test 10/30/2009  . Pneumococcal Conjugate-13 10/03/2014   Pertinent  Health Maintenance Due  Topic Date Due  . FOOT EXAM  Never done  . OPHTHALMOLOGY EXAM  Never done  . DEXA SCAN  Never done  . URINE MICROALBUMIN  10/27/2010  . PNA vac Low Risk Adult (2 of 2 - PPSV23) 10/04/2015  . INFLUENZA VACCINE  06/25/2020  . HEMOGLOBIN A1C  04/12/2021   Fall Risk  06/19/2020 07/20/2015 06/23/2014  Falls in the past year? 1 No No  Number falls in past yr: 0 - -  Injury with Fall? 1 - -  Comment bruises - -   Functional Status Survey:    Vitals:   10/30/20 1644  BP: 133/68  Pulse: 60  Resp: 18  Temp: (!) 97.4 F (36.3 C)  Weight: 206 lb  3.2 oz (93.5 kg)  Height: 5\' 1"  (1.549 m)   Body mass index is 38.96 kg/m. Physical Exam Constitutional:      General: She is not in acute distress.    Appearance: She is obese.  Cardiovascular:     Rate and Rhythm: Regular rhythm. Tachycardia present.     Pulses: Normal pulses.     Heart sounds: Normal heart sounds. No murmur heard.   Pulmonary:     Effort: Pulmonary effort is normal. No respiratory distress.     Breath sounds: Normal breath sounds. No wheezing.  Abdominal:     General: Bowel sounds are normal. There is no distension.     Palpations: Abdomen is soft.     Tenderness: There is no abdominal tenderness.  Feet:     Comments: Left foot surgical incision closed with sutures intact. No drainage present. Erythema and swelling cascading over anterior portion of foot.  Skin:    General: Skin is warm.     Capillary Refill: Capillary refill takes less than 2 seconds.     Comments: Forehead warm to touch  Neurological:     General: No focal deficit present.     Mental Status: She is alert and oriented to person, place, and time.     Motor: Weakness present.     Gait: Gait abnormal.  Psychiatric:        Mood and Affect: Mood normal.        Behavior: Behavior normal.     Labs reviewed: Recent Labs    03/10/20 0205 03/11/20 1326 03/12/20 0450 03/12/20 0450 03/13/20 0214 03/14/20 0300 03/17/20 0345 03/17/20 0345 03/18/20 0645 03/19/20 0825 03/23/20 0503 03/24/20 0650 03/25/20 0350 03/25/20 0350 04/03/20 2010 09/27/20 0758 10/13/20 1016  NA 132*   < > 128*   < > 131*   < > 130*   < > 129*   < > 132*   < > 132*   < > 136 140 139  K 3.3*   < > 3.2*   < > 3.2*   < > 5.2*   < > 4.6   < > 3.9   < > 3.9   < > 3.5 4.0 3.7  CL 95*   < > 91*   < > 93*   < > 100   < > 99   < > 99   < > 101   < > 102 100 103  CO2  25   < > 27   < > 27   < > 20*   < > 23   < > 25   < > 21*  --  23  --  25  GLUCOSE 185*   < > 309*   < > 327*   < > 170*   < > 257*   < > 161*   < > 81   <  > 225* 142* 127*  BUN 8   < > 10   < > 11   < > 16   < > 17   < > 34*   < > 28*   < > 25* 32* 26*  CREATININE 0.94   < > 0.73   < > 0.78   < > 0.70   < > 0.68   < > 1.10*   < > 0.76   < > 0.78 0.90 0.95  CALCIUM 8.6*   < > 8.7*   < > 8.9   < > 9.4   < > 9.2   < > 9.7   < > 9.2  --  9.3  --  9.7  MG 1.7   < > 1.3*   < > 1.6*  --  1.5*  --  2.3  --  1.7  --   --   --   --   --   --   PHOS 2.6  --  1.6*  --  1.7*  --   --   --   --   --   --   --   --   --   --   --   --    < > = values in this interval not displayed.   Recent Labs    03/20/20 0600 03/21/20 0659 03/22/20 0414  AST 29 39 19  ALT 21 22 19   ALKPHOS 74 65 74  BILITOT 0.5 1.2 0.3  PROT 5.4* 5.1* 5.9*  ALBUMIN 2.3* 2.3* 2.6*   Recent Labs    03/03/20 0157 03/03/20 0157 03/04/20 0234 03/10/20 0205 03/13/20 0214 03/13/20 0214 04/03/20 2010 04/03/20 2010 09/27/20 0758 10/13/20 1016 10/30/20 0000  WBC 15.1*   < > 11.3*   < > 9.9   < > 14.8*  --   --  12.3* 22.3  NEUTROABS 11.3*  --  7.1  --   --   --  10.0*  --   --   --   --   HGB 12.7   < > 12.4   < > 11.0*   < > 10.9*   < > 12.9 10.7* 10.3*  HCT 38.4   < > 37.6   < > 33.4*   < > 35.5*   < > 38.0 35.0* 33*  MCV 71.8*   < > 72.0*   < > 71.8*  --  80.0  --   --  70.4*  --   PLT 284   < > 274   < > 262   < > 355  --   --  441* 364   < > = values in this interval not displayed.   Lab Results  Component Value Date   TSH 0.545 02/28/2020   Lab Results  Component Value Date   HGBA1C 8.1 (H) 10/13/2020   Lab Results  Component Value Date   CHOL 242 (H) 06/23/2014   HDL 35 (L) 06/23/2014   LDLCALC NOT CALC 06/23/2014   TRIG 470 (  H) 06/23/2014   CHOLHDL 6.9 06/23/2014    Significant Diagnostic Results in last 30 days:  XR Foot 2 Views Left  Result Date: 10/05/2020 2 view radiographs of the left foot shows destructive osteomyelitis involving the entire MTP joint with pathologic fractures through the metatarsal head and proximal  phalanx.   Assessment/Plan 1. Osteomyelitis of great toe of left foot ([place]) - ongoing, she is febrile with surrounding tissue around incision red, warm and swollen.  - suspect post operative infection versus osteomyelitis - will start doxycycline 100 mg PO BID x 10 days - Advised facility nurse to monitor fever and to continue to give 650 mg tylenol Q6 hrs PRN for fever - Advised facility nurse to encourage hydration with cool fluids every 2 hours while patient is afebrile, may restart if fever rebounds - will forward note to surgeon - continue follow up visit with surgeon tomorrow     Family/ staff Communication: Plan discussed with patient and facility nurse. Note will be forwarded to surgeon.   Labs/tests ordered:  none

## 2020-10-31 ENCOUNTER — Telehealth: Payer: Self-pay | Admitting: Family

## 2020-10-31 ENCOUNTER — Encounter: Payer: Self-pay | Admitting: Family

## 2020-10-31 ENCOUNTER — Ambulatory Visit (HOSPITAL_COMMUNITY): Admission: RE | Admit: 2020-10-31 | Payer: Medicare Other | Source: Ambulatory Visit

## 2020-10-31 ENCOUNTER — Ambulatory Visit (INDEPENDENT_AMBULATORY_CARE_PROVIDER_SITE_OTHER): Payer: Medicare Other | Admitting: Family

## 2020-10-31 ENCOUNTER — Ambulatory Visit (HOSPITAL_COMMUNITY): Payer: Medicare Other

## 2020-10-31 DIAGNOSIS — M869 Osteomyelitis, unspecified: Secondary | ICD-10-CM

## 2020-10-31 NOTE — Telephone Encounter (Signed)
Patient's daughter [name] called requesting a call back from [place] about mom's visit. Please call patient at [phone] or [phone].

## 2020-10-31 NOTE — Progress Notes (Signed)
Post-Op Visit Note   Patient: Yvonne [name]           Date of Birth: 08-Nov-1946           MRN: [id number] Visit Date: 10/31/2020 PCP: Harlan Stains, MD  Chief Complaint:  Chief Complaint  Patient presents with  . Left Foot - Routine Post Op    10/13/20 left foot 1st ray amputation     HPI:  HPI The patient is a 74 year old woman seen status post left foot 1st ray amputation. Is in wheel chair. Residing at skilled nursing.   Ortho Exam On examination of the left foot. The incision is well healed. Sutures harvested with out incident. No gaping, drainage or erythema.   Visit Diagnoses: No diagnosis found.  Plan: continue daily dial soap cleansing. May advance weight bearing as tolerated. Follow up in 4 weeks.  Follow-Up Instructions: No follow-ups on file.   Imaging: No results found.  Orders:  No orders of the defined types were placed in this encounter.  No orders of the defined types were placed in this encounter.    PMFS History: Patient Active Problem List   Diagnosis Date Noted  . Amputation of left great toe ([place]) 10/27/2020  . Osteomyelitis ([place]) 10/13/2020  . Acute hematogenous osteomyelitis of right foot ([place]) 10/13/2020  . Osteomyelitis of great toe of left foot ([place])   . PAD (peripheral artery disease) ([place]) 09/19/2020  . Palliative care by specialist   . Dysphagia   . Hematemesis with nausea   . DKA (diabetic ketoacidoses) 02/28/2020  . Altered mental status   . Dehydration   . Hypokalemia   . Diabetic neuropathy, painful ([place]) 07/20/2015  . Insomnia due to anxiety and fear 07/20/2015  . Anxiety disorder 10/03/2014  . Preventative health care 10/03/2014  . Type 2 diabetes mellitus without complication ([place]) 55/73/2202  . Depression 06/23/2014  . Essential hypertension 06/23/2014  . Peripheral neuropathic pain 06/23/2014  . Gastroesophageal reflux disease without esophagitis 06/23/2014  . Dyslipidemia 06/23/2014  . Chronic neck pain  03/09/2012  . Right arm pain 03/09/2012  . Chronic low back pain 03/09/2012  . Right knee pain 03/09/2012  . Joint stiffness of hand 03/09/2012  . Encephalopathy 02/05/2012  . Hypoxia 02/01/2012  . Fever 02/01/2012  . Leukocytosis 02/01/2012  . Bronchitis 02/01/2012  . Confusion 02/01/2012  . Diarrhea 02/01/2012  . COPD (chronic obstructive pulmonary disease) ([place]) 02/01/2012  . CAP (community acquired pneumonia) 02/01/2012  . Diabetes mellitus ([place])    Past Medical History:  Diagnosis Date  . Asthma   . Blood transfusion   . Cervicalgia   . CHF (congestive heart failure) ([place])   . Chronic pain syndrome   . CKD (chronic kidney disease)   . COPD (chronic obstructive pulmonary disease) ([place])   . Dementia ([place])   . Diabetes mellitus   . Disturbance of skin sensation   . GERD (gastroesophageal reflux disease)   . Hyperlipidemia   . Hypertension   . Insomnia   . Lumbago   . Olecranon bursitis   . Pain in joint, hand   . Polyneuropathy in diabetes(357.2)     History reviewed. No pertinent family history.  Past Surgical History:  Procedure Laterality Date  . ABDOMINAL AORTOGRAM W/LOWER EXTREMITY N/A 09/27/2020   Procedure: ABDOMINAL AORTOGRAM W/LOWER EXTREMITY;  Surgeon: Marty Heck, MD;  Location: [facility];  Service: Cardiovascular;  Laterality: N/A;  . ABDOMINAL HYSTERECTOMY  1970's  . AMPUTATION Left  10/13/2020   Procedure: LEFT FOOT 1ST RAY AMPUTATION;  Surgeon: [name], MD;  Location: [facility];  Service: Orthopedics;  Laterality: Left;  . BACK SURGERY     from bacteria  . BIOPSY  03/05/2020   Procedure: BIOPSY;  Surgeon: Yetta Flock, MD;  Location: [facility] ENDOSCOPY;  Service: Gastroenterology;;  . CATARACT EXTRACTION    . ESOPHAGOGASTRODUODENOSCOPY (EGD) WITH PROPOFOL N/A 03/05/2020   Procedure: ESOPHAGOGASTRODUODENOSCOPY (EGD) WITH PROPOFOL;  Surgeon: Yetta Flock, MD;  Location: [facility];  Service: Gastroenterology;  Laterality:  N/A;  . PERIPHERAL VASCULAR INTERVENTION Left 09/27/2020   Procedure: PERIPHERAL VASCULAR INTERVENTION;  Surgeon: Marty Heck, MD;  Location: [facility];  Service: Cardiovascular;  Laterality: Left;  . SHOULDER ARTHROSCOPY Right    Social History   Occupational History  . Not on file  Tobacco Use  . Smoking status: Former Smoker    Packs/day: 0.50    Years: 30.00    Pack years: 15.00    Types: Cigarettes  . Smokeless tobacco: Never Used  . Tobacco comment: was dx with COPD  Vaping Use  . Vaping Use: Never used  Substance and Sexual Activity  . Alcohol use: No  . Drug use: No  . Sexual activity: Not on file

## 2020-11-01 ENCOUNTER — Other Ambulatory Visit: Payer: Self-pay | Admitting: Orthopedic Surgery

## 2020-11-01 DIAGNOSIS — E118 Type 2 diabetes mellitus with unspecified complications: Secondary | ICD-10-CM

## 2020-11-01 MED ORDER — INSULIN GLARGINE 100 UNIT/ML ~~LOC~~ SOLN: 27.0000 [IU] | Freq: Every day | SUBCUTANEOUS | 3 refills | Status: AC

## 2020-11-01 NOTE — Telephone Encounter (Signed)
Can you call pt's daughter to discuss visit from yesterday.

## 2020-11-01 NOTE — Progress Notes (Signed)
Facility nurse Scot Dock) states patient blood glucose is 409 and has been high for past few days. Advised facility nurse to start sensitive sliding scale for blood glucose >400. Lantus increased to 27 units every night. Advised facility nurse to give 9 units novo log now and recheck blood glucose in two hours.

## 2020-11-02 LAB — CBC: RBC: 4.45 (ref 3.87–5.11)

## 2020-11-02 LAB — CBC AND DIFFERENTIAL
HCT: 30 — AB (ref 36–46)
Hemoglobin: 9.6 — AB (ref 12.0–16.0)
Platelets: 330 (ref 150–399)
WBC: 24.3

## 2020-11-03 ENCOUNTER — Non-Acute Institutional Stay (SKILLED_NURSING_FACILITY): Payer: Medicare Other | Admitting: Orthopedic Surgery

## 2020-11-03 ENCOUNTER — Encounter: Payer: Self-pay | Admitting: Orthopedic Surgery

## 2020-11-03 DIAGNOSIS — D72829 Elevated white blood cell count, unspecified: Secondary | ICD-10-CM

## 2020-11-03 DIAGNOSIS — K5901 Slow transit constipation: Secondary | ICD-10-CM

## 2020-11-03 LAB — CBC AND DIFFERENTIAL
HCT: 36 (ref 36–46)
Hemoglobin: 11.2 — AB (ref 12.0–16.0)
Platelets: 344 (ref 150–399)
WBC: 25.9

## 2020-11-03 LAB — CBC: RBC: 5.28 — AB (ref 3.87–5.11)

## 2020-11-03 NOTE — Progress Notes (Signed)
Location:    [place] Room Number: 108/P Place of Service:  SNF (31) Provider:Edahi Kroening NP    Harlan Stains, MD  Patient Care Team: Harlan Stains, MD as PCP - General (Family Medicine) Harlan Stains, MD as Referring Physician [facility] Medicine)  Extended Emergency Contact Information Primary Emergency Contact: Brandyn, Thien Home Phone: [phone] Mobile Phone: [phone] Relation: Relative Secondary Emergency Contact: Harlan Stains Address: [address]          [place], [place] [address] Johnnette Litter of [place] Phone: [phone] Mobile Phone: [phone] Relation: Relative  Code Status: Full Code  Goals of care: Advanced Directive information Advanced Directives 11/03/2020  Does Patient Have a Medical Advance Directive? Yes  Type of Advance Directive -  Does patient want to make changes to medical advance directive? No - Patient declined  Would patient like information on creating a medical advance directive? -  Pre-existing out of facility DNR order (yellow form or pink MOST form) -     Chief Complaint  Patient presents with  . Acute Visit    Hyperglycemia     HPI:  Pt is a 74 y.o. female seen today for an acute visit for elevated white blood cell count.   She is a resident of [place], seen at bedside today. PMH includes: hypertension, PAD, COPD, GERD, dysphagia, diabetes mellitus, diabetic neuropathy, and recent osteomyelitis of left great toe.   She was last seen 12/6 for fever of 101.2. She was responsive to 650 mg tylenol. CBC revealed wbc>22. Seen by Dr. Sharol Given 12/7, he noted no signs of infection from surgical site. She denies increased pain with left foot.   Today, her wbc is 25. She has been afebrile since first incident. She denies feeling sick, short of breath, chest pain, or dysuria. Admits she has not had a "healthy" bowel movement in the past week. Facility nurse reports they  manually tried to disimpact and gave fleet enema 12/8. She has not had a BM since. She denies abdominal pain and nausea.    Past Medical History:  Diagnosis Date  . Asthma   . Blood transfusion   . Cervicalgia   . CHF (congestive heart failure) ([place])   . Chronic pain syndrome   . CKD (chronic kidney disease)   . COPD (chronic obstructive pulmonary disease) ([place])   . Dementia ([place])   . Diabetes mellitus   . Disturbance of skin sensation   . GERD (gastroesophageal reflux disease)   . Hyperlipidemia   . Hypertension   . Insomnia   . Lumbago   . Olecranon bursitis   . Pain in joint, hand   . Polyneuropathy in diabetes(357.2)    Past Surgical History:  Procedure Laterality Date  . ABDOMINAL AORTOGRAM W/LOWER EXTREMITY N/A 09/27/2020   Procedure: ABDOMINAL AORTOGRAM W/LOWER EXTREMITY;  Surgeon: Marty Heck, MD;  Location: [place] CV LAB;  Service: Cardiovascular;  Laterality: N/A;  . ABDOMINAL HYSTERECTOMY  1970's  . AMPUTATION Left 10/13/2020   Procedure: LEFT FOOT 1ST RAY AMPUTATION;  Surgeon: [name], MD;  Location: [facility];  Service: Orthopedics;  Laterality: Left;  . BACK SURGERY     from bacteria  . BIOPSY  03/05/2020   Procedure: BIOPSY;  Surgeon: Yetta Flock, MD;  Location: [facility];  Service: Gastroenterology;;  . CATARACT EXTRACTION    . ESOPHAGOGASTRODUODENOSCOPY (EGD) WITH PROPOFOL N/A 03/05/2020   Procedure: ESOPHAGOGASTRODUODENOSCOPY (EGD) WITH PROPOFOL;  Surgeon: Plum Branch Cellar  P, MD;  Location: [place];  Service: Gastroenterology;  Laterality: N/A;  . PERIPHERAL VASCULAR INTERVENTION Left 09/27/2020   Procedure: PERIPHERAL VASCULAR INTERVENTION;  Surgeon: Marty Heck, MD;  Location: [place] [facility];  Service: Cardiovascular;  Laterality: Left;  . SHOULDER ARTHROSCOPY Right     Allergies  Allergen Reactions  . Clonazepam Other (See Comments)    Unsteady gait, ineffective  . Trazodone And Nefazodone Other (See  Comments)    Unsteady gait, disoriented    Allergies as of 11/03/2020      Reactions   Clonazepam Other (See Comments)   Unsteady gait, ineffective   Trazodone And Nefazodone Other (See Comments)   Unsteady gait, disoriented      Medication List       Accurate as of November 03, 2020  2:28 PM. If you have any questions, ask your nurse or doctor.        STOP taking these medications   Nyamyc powder Generic drug: nystatin Stopped by: Yvonna Alanis, NP     TAKE these medications   acetaminophen 500 MG tablet Commonly known as: TYLENOL Take 500 mg by mouth every 6 (six) hours as needed (pain.).   acetaminophen 325 MG tablet Commonly known as: TYLENOL Take 650 mg by mouth every 6 (six) hours as needed. For fever greater than 101 degrees Fahrenheit and notify MD. (Physician Order) S/R: Routine Temperature Not to exceed 3083m in 24 hour period   albuterol 108 (90 Base) MCG/ACT inhaler Commonly known as: VENTOLIN HFA Inhale 2 puffs into the lungs every 6 (six) hours as needed for wheezing.   ALPRAZolam 0.25 MG tablet Commonly known as: XANAX Take 1 tablet (0.25 mg total) by mouth daily as needed for anxiety.   barrier cream Crea Commonly known as: non-specified Apply 1 application topically. Apply TO BUTTOCKS WITH BATHING AND INCONTINENCE CARE   clopidogrel 75 MG tablet Commonly known as: Plavix Take 1 tablet (75 mg total) by mouth daily.   docusate sodium 100 MG capsule Commonly known as: COLACE Take 100 mg by mouth 2 (two) times daily. For Constipation   doxycycline 100 MG capsule Commonly known as: VIBRAMYCIN Take 100 mg by mouth 2 (two) times daily.   DULoxetine 60 MG capsule Commonly known as: CYMBALTA Take 60 mg by mouth daily. For Depression   feeding supplement (PRO-STAT 64) Liqd Take 30 mLs by mouth in the morning, at noon, and at bedtime. To promote wound healing   furosemide 40 MG tablet Commonly known as: LASIX Take 1 tablet (40 mg total) by mouth  2 (two) times daily.   gabapentin 100 MG capsule Commonly known as: NEURONTIN Take 100 mg by mouth 3 (three) times daily. (0900, 1400, & 2100)   HumaLOG 100 UNIT/ML cartridge Generic drug: insulin lispro Inject into the skin. CHECK BS BEFORE MEALS THREE TIMES DAILY AND AT BEDTIME WITH HUMALOG SENSITIVE SSI- CBG 121-150=1U,151-200=2U,201-250=3U,251-300=5U.301-350=7,351-400=9U,GREATER THAN 400=9U,CALLMD What changed: Another medication with the same name was removed. Continue taking this medication, and follow the directions you see here. Changed by: AYvonna Alanis NP   insulin glargine 100 UNIT/ML injection Commonly known as: Lantus Inject 0.27 mLs (27 Units total) into the skin at bedtime.   magic mouthwash w/lidocaine Soln Take 5 mLs by mouth 3 (three) times daily as needed for mouth pain.   metoprolol tartrate 25 MG tablet Commonly known as: LOPRESSOR Take 0.5 tablets (12.5 mg total) by mouth 2 (two) times daily.   multivitamin with minerals Tabs tablet Take  1 tablet by mouth daily.   pantoprazole 40 MG tablet Commonly known as: PROTONIX Take 1 tablet (40 mg total) by mouth every 12 (twelve) hours.   polyethylene glycol 17 g packet Commonly known as: MIRALAX / GLYCOLAX Take 17 g by mouth 2 (two) times daily.   pravastatin 20 MG tablet Commonly known as: PRAVACHOL Take 1 tablet (20 mg total) by mouth daily.       Review of Systems  Constitutional: Negative for activity change, appetite change and fever.  HENT: Negative for congestion and sore throat.   Respiratory: Negative for cough, shortness of breath and wheezing.   Cardiovascular: Negative for chest pain and leg swelling.  Gastrointestinal: Positive for constipation. Negative for abdominal pain and nausea.  Genitourinary: Negative for dysuria, frequency and hematuria.  Musculoskeletal: Negative for joint swelling.       Left great toe amputation  Neurological: Positive for weakness.  Psychiatric/Behavioral:  Negative for dysphoric mood. The patient is not nervous/anxious.     Immunization History  Administered Date(s) Administered  . Influenza Split 02/02/2012  . Influenza,inj,Quad PF,6+ Mos 10/03/2014  . PPD Test 10/30/2009  . Pneumococcal Conjugate-13 10/03/2014   Pertinent  Health Maintenance Due  Topic Date Due  . FOOT EXAM  Never done  . OPHTHALMOLOGY EXAM  Never done  . DEXA SCAN  Never done  . URINE MICROALBUMIN  10/27/2010  . PNA vac Low Risk Adult (2 of 2 - PPSV23) 10/04/2015  . INFLUENZA VACCINE  06/25/2020  . HEMOGLOBIN A1C  04/12/2021   Fall Risk  06/19/2020 07/20/2015 06/23/2014  Falls in the past year? 1 No No  Number falls in past yr: 0 - -  Injury with Fall? 1 - -  Comment bruises - -   Functional Status Survey:    Vitals:   11/03/20 1413  BP: 104/73  Pulse: 72  Resp: 18  Temp: 97.8 F (36.6 C)  Weight: 206 lb 3.2 oz (93.5 kg)  Height: _0  (1.549 m)   Body mass index is 38.96 kg/m. Physical Exam Vitals reviewed.  Constitutional:      Appearance: She is obese.  HENT:     Head: Normocephalic.     Right Ear: There is no impacted cerumen.     Left Ear: There is no impacted cerumen.     Nose: No congestion.     Mouth/Throat:     Mouth: Mucous membranes are moist.     Pharynx: No posterior oropharyngeal erythema.  Eyes:     Extraocular Movements: Extraocular movements intact.     Pupils: Pupils are equal, round, and reactive to light.  Cardiovascular:     Rate and Rhythm: Normal rate and regular rhythm.     Pulses: Normal pulses.     Heart sounds: Normal heart sounds. No murmur heard.   Pulmonary:     Effort: Pulmonary effort is normal. No respiratory distress.     Breath sounds: Normal breath sounds. No wheezing.  Abdominal:     General: Bowel sounds are normal. There is no distension.     Palpations: Abdomen is soft.     Tenderness: There is no abdominal tenderness.  Musculoskeletal:     Right lower leg: No edema.     Left lower leg: No  edema.     Comments: Left toe incision closed, CDI, no drainage present. Surrounding tissue negative for erythema and effusion.   Lymphadenopathy:     Cervical: No cervical adenopathy.  Skin:    General: Skin  is warm and dry.     Capillary Refill: Capillary refill takes less than 2 seconds.  Neurological:     General: No focal deficit present.     Mental Status: She is alert. Mental status is at baseline.     Motor: Weakness present.     Gait: Gait abnormal.  Psychiatric:        Mood and Affect: Mood normal.        Behavior: Behavior normal.        Thought Content: Thought content normal.        Judgment: Judgment normal.     Labs reviewed: Recent Labs    03/10/20 0205 03/11/20 1326 03/12/20 0450 03/13/20 0214 03/14/20 0300 03/17/20 0345 03/18/20 0645 03/19/20 0825 03/23/20 0503 03/24/20 0650 03/25/20 0350 04/03/20 2010 09/27/20 0758 10/13/20 1016  NA 132*   < > 128* 131*   < > 130* 129*   < > 132*   < > 132* 136 140 139  K 3.3*   < > 3.2* 3.2*   < > 5.2* 4.6   < > 3.9   < > 3.9 3.5 4.0 3.7  CL 95*   < > 91* 93*   < > 100 99   < > 99   < > 101 102 100 103  CO2 25   < > 27 27   < > 20* 23   < > 25   < > 21* 23  --  25  GLUCOSE 185*   < > 309* 327*   < > 170* 257*   < > 161*   < > 81 225* 142* 127*  BUN 8   < > 10 11   < > 16 17   < > 34*   < > 28* 25* 32* 26*  CREATININE 0.94   < > 0.73 0.78   < > 0.70 0.68   < > 1.10*   < > 0.76 0.78 0.90 0.95  CALCIUM 8.6*   < > 8.7* 8.9   < > 9.4 9.2   < > 9.7   < > 9.2 9.3  --  9.7  MG 1.7   < > 1.3* 1.6*  --  1.5* 2.3  --  1.7  --   --   --   --   --   PHOS 2.6  --  1.6* 1.7*  --   --   --   --   --   --   --   --   --   --    < > = values in this interval not displayed.   Recent Labs    03/20/20 0600 03/21/20 0659 03/22/20 0414  AST 29 39 19  ALT _0 ALKPHOS 74 65 74  BILITOT 0.5 1.2 0.3  PROT 5.4* 5.1* 5.9*  ALBUMIN 2.3* 2.3* 2.6*   Recent Labs    03/03/20 0157 03/04/20 0234 03/10/20 0205 03/13/20 0214  04/03/20 2010 09/27/20 0758 10/13/20 1016 10/30/20 0000 11/02/20 0000 11/03/20 0000  WBC 15.1* 11.3*   < > 9.9 14.8*  --  12.3* 22.3 24.3 25.9  NEUTROABS 11.3* 7.1  --   --  10.0*  --   --   --   --   --   HGB 12.7 12.4   < > 11.0* 10.9*   < > 10.7* 10.3* 9.6* 11.2*  HCT 38.4 37.6   < > 33.4* 35.5*   < > 35.0* 33* 30*  36  MCV 71.8* 72.0*   < > 71.8* 80.0  --  70.4*  --   --   --   PLT 284 274   < > 262 355  --  441* 364 330 344   < > = values in this interval not displayed.   Lab Results  Component Value Date   TSH 0.545 02/28/2020   Lab Results  Component Value Date   HGBA1C 8.1 (H) 10/13/2020   Lab Results  Component Value Date   CHOL 242 (H) 06/23/2014   HDL 35 (L) 06/23/2014   LDLCALC NOT CALC 06/23/2014   TRIG 470 (H) 06/23/2014   CHOLHDL 6.9 06/23/2014    Significant Diagnostic Results in last 30 days:  XR Foot 2 Views Left  Result Date: 10/05/2020 2 view radiographs of the left foot shows destructive osteomyelitis involving the entire MTP joint with pathologic fractures through the metatarsal head and proximal phalanx.   Assessment/Plan 1. Leukocytosis, unspecified type - history of osteomyelitis with left great toe amputation 11/21 - wbc 25, history of fever 2 days ago- resolved with tylenol - no signs of infection from surgical site, lungs clear, no cold symptoms, covid-19 test negative - sugars have been running high past week, improved with sliding scale added, lantus increased to 27 units QHS  - history of frequent UTI - u/a with culture- today - repeat cbc/diff 12/13 - esr 12/13 -crp 12/13  2. Slow transit constipation - failed disimpactions with enema 2 days ago - patient asymptomatic - order KUB - start colace 100 mg PO BID - start Miralax 17 GM PO BID - encourage oral hydration with water throughout day     Family/ staff Communication: Plan discussed with patient and facility nurse  Labs/tests ordered:   Cbc/diff, ers, crp, KUB

## 2020-11-06 LAB — CBC AND DIFFERENTIAL
HCT: 31 — AB (ref 36–46)
Hemoglobin: 9.8 — AB (ref 12.0–16.0)
Platelets: 348 (ref 150–399)
WBC: 15.9

## 2020-11-06 LAB — POCT ERYTHROCYTE SEDIMENTATION RATE, NON-AUTOMATED: Sed Rate: 102

## 2020-11-06 LAB — CBC: RBC: 4.6 (ref 3.87–5.11)

## 2020-11-07 ENCOUNTER — Telehealth: Payer: Self-pay | Admitting: Orthopedic Surgery

## 2020-11-07 NOTE — Telephone Encounter (Signed)
Patient's daughter n [employer], site called requesting for pt to be measured for shoe inserts for her up coming appt Jan.4,. Please call if more  information is needed. Pt has dementia. Pt daughter phone number is [phone] or [phone].

## 2020-11-08 NOTE — Telephone Encounter (Signed)
Noted  

## 2020-11-09 LAB — CBC AND DIFFERENTIAL
HCT: 30 — AB (ref 36–46)
Hemoglobin: 9.5 — AB (ref 12.0–16.0)
Platelets: 444 — AB (ref 150–399)
WBC: 18.3

## 2020-11-09 LAB — CBC: RBC: 4.47 (ref 3.87–5.11)

## 2020-11-15 ENCOUNTER — Encounter (HOSPITAL_COMMUNITY): Payer: Medicare Other

## 2020-11-20 ENCOUNTER — Other Ambulatory Visit: Payer: Self-pay

## 2020-11-20 ENCOUNTER — Ambulatory Visit (INDEPENDENT_AMBULATORY_CARE_PROVIDER_SITE_OTHER): Payer: Medicare Other | Admitting: Physician Assistant

## 2020-11-20 ENCOUNTER — Ambulatory Visit (HOSPITAL_COMMUNITY)
Admission: RE | Admit: 2020-11-20 | Discharge: 2020-11-20 | Disposition: A | Discharge status: Home / Self Care | Payer: Medicare Other | Source: Ambulatory Visit | Attending: Vascular Surgery | Admitting: Vascular Surgery

## 2020-11-20 ENCOUNTER — Telehealth: Payer: Self-pay | Admitting: *Deleted

## 2020-11-20 ENCOUNTER — Telehealth: Payer: Self-pay | Admitting: Hematology

## 2020-11-20 ENCOUNTER — Ambulatory Visit (INDEPENDENT_AMBULATORY_CARE_PROVIDER_SITE_OTHER)
Admission: RE | Admit: 2020-11-20 | Discharge: 2020-11-20 | Disposition: A | Discharge status: Home / Self Care | Payer: Medicare Other | Source: Ambulatory Visit | Attending: Vascular Surgery | Admitting: Vascular Surgery

## 2020-11-20 VITALS — BP 124/61 | HR 67 | Temp 98.3°F | Resp 20 | Ht 61.0 in | Wt 205.0 lb

## 2020-11-20 DIAGNOSIS — I739 Peripheral vascular disease, unspecified: Secondary | ICD-10-CM

## 2020-11-20 NOTE — Telephone Encounter (Signed)
Daughter [name] states she would like to get patient back in to see Dr Candise Che. Is now living in a SNF and they provide transportation.  Message to schedulers

## 2020-11-20 NOTE — Telephone Encounter (Signed)
Scheduled follow-up appointment per 12/27 schedule message. Patient's daughter is aware.

## 2020-11-20 NOTE — Progress Notes (Signed)
Office Note     CC:  follow up; 8 weeks post-procedure Requesting Provider:  Laurann Montana, MD  HPI: Yvonne [name] is a 74 y.o. (06-18-1946) female who presents for routine follow-up following aortogram on September 27, 2020 with Dr. Chestine Spore.  She presented with nonhealing left first metatarsal head wound.  She underwent left SFA stent placement x 3 with angioplasty and left peroneal artery angioplasty.  On Plavix 75 mg daily.  The patient is nonambulatory and has history of dementia.  She is a resident at [facility] and [address]. She underwent left first ray amputation by Dr. Lajoyce Corners on 10/13/2020.  She is accompanied today by medical assistant.  She arrives in wheelchair but states she tranfers without pain and denies pain at nighttime or with leg elevation.  Findings on arteriogram as follows: Aortogram showed no flow-limiting stenosis in the aortoiliac segment. The left main renal and an accessory right renal were visualized.  Left lower extremity arteriogram showed a patent common femoral withtwoprofunda branches. She had a short stump of SFA proximally and then a long segment SFA occlusion with distal reconstitution of the SFAjust before Hunter's canal. The above and below-knee popliteal artery werewidely patent with two-vessel runoff in the anterior tibial and peroneal artery.  Right lower extremityarteriogram shows a patent common femoral andprofunda. Her SFA was patent except for a focal mid high-grade midSFA stenosis.The above andbelow-knee popliteal artery was patent. Three-vessel runoff.  Ultimately the left SFA chronic total occlusion was crossed antegrade. This was predilated with a 4 mm Mustang and then primarily stented with a 6 mm Eluviax3 as noted above. Stents were postdilated with a 5 mm Mustang. Final injection showed inline flow through the SFA stentswith no residual stenosisandwidely patent stents. There did appear to be a lesion in the left peroneal  arteryafter stent placement and this may have been related to atheroembolism after crossing the chronic total occlusion. This was treated with a 3 mm Sterling baloon angioplastywith complete resolution and preserved two vessel runoff.  History of diabetes mellitus-insulin requiring History of hyperlipidemia-maintained on statin History of hypertension-maintained on beta-blocker and diuretic  Past Medical History:  Diagnosis Date  . Asthma   . Blood transfusion   . Cervicalgia   . CHF (congestive heart failure) (HCC)   . Chronic pain syndrome   . CKD (chronic kidney disease)   . COPD (chronic obstructive pulmonary disease) (HCC)   . Dementia (HCC)   . Diabetes mellitus   . Disturbance of skin sensation   . GERD (gastroesophageal reflux disease)   . Hyperlipidemia   . Hypertension   . Insomnia   . Lumbago   . Olecranon bursitis   . Pain in joint, hand   . Polyneuropathy in diabetes(357.2)     Past Surgical History:  Procedure Laterality Date  . ABDOMINAL AORTOGRAM W/LOWER EXTREMITY N/A 09/27/2020   Procedure: ABDOMINAL AORTOGRAM W/LOWER EXTREMITY;  Surgeon: Cephus Shelling, MD;  Location: [facility];  Service: Cardiovascular;  Laterality: N/A;  . ABDOMINAL HYSTERECTOMY  1970's  . AMPUTATION Left 10/13/2020   Procedure: LEFT FOOT 1ST RAY AMPUTATION;  Surgeon: Nadara Mustard, MD;  Location: [facility];  Service: Orthopedics;  Laterality: Left;  . BACK SURGERY     from bacteria  . BIOPSY  03/05/2020   Procedure: BIOPSY;  Surgeon: Benancio Deeds, MD;  Location: [facility]OS[facility];  Service: Gastroenterology;;  . CATARACT EXTRACTION    . ESOPHAGOGASTRODUODENOSCOPY (EGD) WITH PROPOFOL N/A 03/05/2020   Procedure: ESOPHAGOGASTRODUODENOSCOPY (EGD)  WITH PROPOFOL;  Surgeon: Yetta Flock, MD;  Location: [place];  Service: Gastroenterology;  Laterality: N/A;  . PERIPHERAL VASCULAR INTERVENTION Left 09/27/2020   Procedure: PERIPHERAL VASCULAR INTERVENTION;   Surgeon: Marty Heck, MD;  Location: [place] [facility];  Service: Cardiovascular;  Laterality: Left;  . SHOULDER ARTHROSCOPY Right     Social History   Socioeconomic History  . Marital status: Widowed    Spouse name: Not on file  . Number of children: 3  . Years of education: 24  . Highest education level: Not on file  Occupational History  . Not on file  Tobacco Use  . Smoking status: Former Smoker    Packs/day: 0.50    Years: 30.00    Pack years: 15.00    Types: Cigarettes  . Smokeless tobacco: Never Used  . Tobacco comment: was dx with COPD  Vaping Use  . Vaping Use: Never used  Substance and Sexual Activity  . Alcohol use: No  . Drug use: No  . Sexual activity: Not on file  Other Topics Concern  . Not on file  Social History Narrative   06/19/20 living at [facility]    Social Determinants of Health   Financial Resource Strain: Not on file  Food Insecurity: Not on file  Transportation Needs: Not on file  Physical Activity: Not on file  Stress: Not on file  Social Connections: Not on file  Intimate Partner Violence: Not on file   No family history on file.  Current Outpatient Medications  Medication Sig Dispense Refill  . acetaminophen (TYLENOL) 325 MG tablet Take 650 mg by mouth every 6 (six) hours as needed. For fever greater than 101 degrees Fahrenheit and notify MD. (Physician Order) S/R: Routine Temperature Not to exceed 3000mg  in 24 hour period    . acetaminophen (TYLENOL) 500 MG tablet Take 500 mg by mouth every 6 (six) hours as needed (pain.).    Marland Kitchen albuterol (PROVENTIL HFA;VENTOLIN HFA) 108 (90 BASE) MCG/ACT inhaler Inhale 2 puffs into the lungs every 6 (six) hours as needed for wheezing. 1 Inhaler 3  . ALPRAZolam (XANAX) 0.25 MG tablet Take 1 tablet (0.25 mg total) by mouth daily as needed for anxiety. 3 tablet 0  . Amino Acids-Protein Hydrolys (FEEDING SUPPLEMENT, PRO-STAT 64,) LIQD Take 30 mLs by mouth in the morning, at noon, and at  bedtime. To promote wound healing    . barrier cream (NON-SPECIFIED) CREA Apply 1 application topically. Apply TO BUTTOCKS WITH BATHING AND INCONTINENCE CARE    . clopidogrel (PLAVIX) 75 MG tablet Take 1 tablet (75 mg total) by mouth daily. 30 tablet 11  . docusate sodium (COLACE) 100 MG capsule Take 100 mg by mouth 2 (two) times daily. For Constipation    . DULoxetine (CYMBALTA) 60 MG capsule Take 60 mg by mouth daily. For Depression    . furosemide (LASIX) 40 MG tablet Take 1 tablet (40 mg total) by mouth 2 (two) times daily. 30 tablet 0  . gabapentin (NEURONTIN) 100 MG capsule Take 100 mg by mouth 3 (three) times daily. (0900, 1400, & 2100)    . insulin glargine (LANTUS) 100 UNIT/ML injection Inject 0.27 mLs (27 Units total) into the skin at bedtime. 30 mL 3  . insulin lispro (HUMALOG) 100 UNIT/ML cartridge Inject into the skin. CHECK BS BEFORE MEALS THREE TIMES DAILY AND AT BEDTIME WITH HUMALOG SENSITIVE SSI- CBG 121-150=1U,151-200=2U,201-250=3U,251-300=5U.301-350=7,351-400=9U,GREATER THAN 400=9U,CALLMD    . magic mouthwash w/lidocaine SOLN Take 5 mLs by mouth  3 (three) times daily as needed for mouth pain.  0  . metoprolol tartrate (LOPRESSOR) 25 MG tablet Take 0.5 tablets (12.5 mg total) by mouth 2 (two) times daily.    . Multiple Vitamin (MULTIVITAMIN WITH MINERALS) TABS tablet Take 1 tablet by mouth daily.    . pantoprazole (PROTONIX) 40 MG tablet Take 1 tablet (40 mg total) by mouth every 12 (twelve) hours.    . polyethylene glycol (MIRALAX / GLYCOLAX) 17 g packet Take 17 g by mouth 2 (two) times daily.    . pravastatin (PRAVACHOL) 20 MG tablet Take 1 tablet (20 mg total) by mouth daily. 90 tablet 3   No current facility-administered medications for this visit.    Allergies  Allergen Reactions  . Clonazepam Other (See Comments)    Unsteady gait, ineffective  . Trazodone And Nefazodone Other (See Comments)    Unsteady gait, disoriented     REVIEW OF SYSTEMS:   [X]  denotes  positive finding, [ ]  denotes negative finding Cardiac  Comments:  Chest pain or chest pressure:    Shortness of breath upon exertion:    Short of breath when lying flat:    Irregular heart rhythm:        Vascular    Pain in calf, thigh, or hip brought on by ambulation:    Pain in feet at night that wakes you up from your sleep:     Blood clot in your veins:    Leg swelling:         Pulmonary    Oxygen at home:    Productive cough:     Wheezing:         Neurologic    Sudden weakness in arms or legs:     Sudden numbness in arms or legs:     Sudden onset of difficulty speaking or slurred speech:    Temporary loss of vision in one eye:     Problems with dizziness:         Gastrointestinal    Blood in stool:     Vomited blood:         Genitourinary    Burning when urinating:     Blood in urine:        Psychiatric    Major depression:         Hematologic    Bleeding problems:    Problems with blood clotting too easily:        Skin    Rashes or ulcers:        Constitutional    Fever or chills:      PHYSICAL EXAMINATION: Today's Vitals   11/20/20 1341  BP: 124/61  Pulse: 67  Resp: 20  Temp: 98.3 F (36.8 C)  TempSrc: Temporal  SpO2: 100%  Weight: 205 lb (93 kg)  Height: 5\' 1"  (1.549 m)  PainSc: 3    Body mass index is 38.73 kg/m. Marland Kitchen General:  WDWN in NAD; vital signs documented above Gait: Not observed HENT: WNL, normocephalic Pulmonary: normal non-labored breathing , without Rales, rhonchi,  wheezing Cardiac: regular HR, without  Murmurs without carotid bruit Skin: without rashes Vascular Exam/Pulses: 2+ left radial pulse, right radial not palpable Extremities: without ischemic changes, without Gangrene , without cellulitis; without open wounds;  Amputation site is healing.  Both feet are warm and appear well perfused.  Pedal pulses are nonpalpable Musculoskeletal: no muscle wasting or atrophy  Neurologic: A&O X 3;  No focal weakness or paresthesias  are detected  Psychiatric:  The pt has Normal affect.  Left foot     Non-Invasive Vascular Imaging:   11/20/2020 ABI/TBIToday's ABIToday's TBIPrevious ABIPrevious TBI  +-------+-----------+-----------+------------+------------+  Right 0.87    0.45    0.84    0.37      +-------+-----------+-----------+------------+------------+  Left  0.58    amputated 1.04    0.60      +-------+-----------+-----------+------------+------------+  Arterial duplex left SFA stents: Left: No color or spectral Doppler flow observed within the superficial  femoral artery stent.   03/13/2020: Right ABI 0.84, TBI 0.37 Left ABI 1.04, TBI 0.6  ASSESSMENT/PLAN:: 74 y.o. female here for follow up for peripheral arterial disease status post left superficial femoral artery stent placement x3 with angioplasty.  She underwent left first ray amputation approximately 2 weeks after arteriogram.  She has healed this area well.  She currently is without complaints of lower extremity pain with activity or rest pain.  She ambulates very little.  Decrease in left ABI and lack of flow observed in left SFA stent.  Recommend continuing Plavix for the time being and reevaluate in 3 months.  Recommend returning sooner should develop nonhealing foot wounds or develop lower extremity pain.  Barbie Banner, PA-C [facility] [phone]  Clinic MD:   Dr. Oneida Alar on call

## 2020-11-21 ENCOUNTER — Other Ambulatory Visit: Payer: Self-pay

## 2020-11-21 DIAGNOSIS — I739 Peripheral vascular disease, unspecified: Secondary | ICD-10-CM

## 2020-11-22 ENCOUNTER — Encounter: Payer: Self-pay | Admitting: Orthopedic Surgery

## 2020-11-22 ENCOUNTER — Non-Acute Institutional Stay (SKILLED_NURSING_FACILITY): Payer: Medicare Other | Admitting: Orthopedic Surgery

## 2020-11-22 DIAGNOSIS — K5901 Slow transit constipation: Secondary | ICD-10-CM | POA: Diagnosis not present

## 2020-11-22 DIAGNOSIS — S98112A Complete traumatic amputation of left great toe, initial encounter: Secondary | ICD-10-CM

## 2020-11-22 DIAGNOSIS — I739 Peripheral vascular disease, unspecified: Secondary | ICD-10-CM

## 2020-11-22 DIAGNOSIS — J449 Chronic obstructive pulmonary disease, unspecified: Secondary | ICD-10-CM

## 2020-11-22 DIAGNOSIS — F0390 Unspecified dementia without behavioral disturbance: Secondary | ICD-10-CM

## 2020-11-22 DIAGNOSIS — E118 Type 2 diabetes mellitus with unspecified complications: Secondary | ICD-10-CM | POA: Diagnosis not present

## 2020-11-22 DIAGNOSIS — I10 Essential (primary) hypertension: Secondary | ICD-10-CM

## 2020-11-22 DIAGNOSIS — K219 Gastro-esophageal reflux disease without esophagitis: Secondary | ICD-10-CM

## 2020-11-22 NOTE — Progress Notes (Signed)
Location:   [facility] of Service:   [facility] Provider:  Windell Moulding, AGNP-C  Harlan Stains, MD  Patient Care Team: Harlan Stains, MD as PCP - General (Family Medicine) Harlan Stains, MD as Referring Physician [facility] Medicine)  Extended Emergency Contact Information Primary Emergency Contact: Patric, Christakos Home Phone: [phone] Mobile Phone: [phone] Relation: Relative Secondary Emergency Contact: Harlan Stains Address: [address]          [place], [place] [address] Johnnette Litter of [place] Phone: [phone] Mobile Phone: [phone] Relation: Relative  Code Status:  Full Goals of care: Advanced Directive information Advanced Directives 11/03/2020  Does Patient Have a Medical Advance Directive? Yes  Type of Advance Directive -  Does patient want to make changes to medical advance directive? No - Patient declined  Would patient like information on creating a medical advance directive? -  Pre-existing out of facility DNR order (yellow form or pink MOST form) -     No chief complaint on file.   HPI:  Pt is a 74 y.o. female seen today for medical management of chronic diseases.  She is a resident of [facility], seen today at bedside. PMH includes: hypertension, peripheral artery disease, COPD, GERD, type 2 diabetes mellitus, confusion, osteomyelitis of left great toe with amputation, and dyslipidemia.   She was seen by Dr. Carlis Abbott 12/27 to f/u on PAD. Left ABI has declined from 1 to 0.5 with evidence of stent occlusion on ultrasound. Since she is asymptomatic, she has been advised to continue Plavix and statin and follow up with Dr. Carlis Abbott in 3 months to repeat ABI's. If any pain during rest or non-healing wound advised to f/u sooner.   Today, she is sleeping in bed. Easily awoken, she remains pleasantly confused and asking when she can go home. Follows commands and  can express needs. Nonambulatory, uses wheelchair. She was transferred to residential hall 12/21. She is adjusting well to her new room. Facility nurse denies any behavioral outbursts, falls or injuries.   Recent blood pressures are as follows:   12/29- 111/74  12/28- 129/74  12/27- 126/72  Recent blood sugars are as follows:  12/28- 84, 139, 218, 256  12/27- 172, [id number]   No hypoglycemia events reported.   Recent weights are as follows:  12/24- 205 lbs  12/01- 206.2 lbs  Left great toe amputation closed with no drainage or sign of infection. Plans to f/u with Dr. Sharol Given office 11/28/20.   Facility nurse does not report any concerns at this time, vitals stable.     Past Medical History:  Diagnosis Date  . Asthma   . Blood transfusion   . Cervicalgia   . CHF (congestive heart failure) ([place])   . Chronic pain syndrome   . CKD (chronic kidney disease)   . COPD (chronic obstructive pulmonary disease) ([place])   . Dementia ([place])   . Diabetes mellitus   . Disturbance of skin sensation   . GERD (gastroesophageal reflux disease)   . Hyperlipidemia   . Hypertension   . Insomnia   . Lumbago   . Olecranon bursitis   . Pain in joint, hand   . Polyneuropathy in diabetes(357.2)    Past Surgical History:  Procedure Laterality Date  . ABDOMINAL AORTOGRAM W/LOWER EXTREMITY N/A 09/27/2020   Procedure: ABDOMINAL AORTOGRAM W/LOWER EXTREMITY;  Surgeon: Marty Heck, MD;  Location: [facility];  Service: Cardiovascular;  Laterality: N/A;  .  ABDOMINAL HYSTERECTOMY  1970's  . AMPUTATION Left 10/13/2020   Procedure: LEFT FOOT 1ST RAY AMPUTATION;  Surgeon: [name], MD;  Location: [facility];  Service: Orthopedics;  Laterality: Left;  . BACK SURGERY     from bacteria  . BIOPSY  03/05/2020   Procedure: BIOPSY;  Surgeon: Yetta Flock, MD;  Location: [facility];  Service: Gastroenterology;;  . CATARACT EXTRACTION    . ESOPHAGOGASTRODUODENOSCOPY (EGD) WITH PROPOFOL  N/A 03/05/2020   Procedure: ESOPHAGOGASTRODUODENOSCOPY (EGD) WITH PROPOFOL;  Surgeon: Yetta Flock, MD;  Location: [facility];  Service: Gastroenterology;  Laterality: N/A;  . PERIPHERAL VASCULAR INTERVENTION Left 09/27/2020   Procedure: PERIPHERAL VASCULAR INTERVENTION;  Surgeon: Marty Heck, MD;  Location: [facility];  Service: Cardiovascular;  Laterality: Left;  . SHOULDER ARTHROSCOPY Right     Allergies  Allergen Reactions  . Clonazepam Other (See Comments)    Unsteady gait, ineffective  . Trazodone And Nefazodone Other (See Comments)    Unsteady gait, disoriented    Outpatient Encounter Medications as of 11/22/2020  Medication Sig  . acetaminophen (TYLENOL) 325 MG tablet Take 650 mg by mouth every 6 (six) hours as needed. For fever greater than 101 degrees Fahrenheit and notify MD. (Physician Order) S/R: Routine Temperature Not to exceed 3000mg  in 24 hour period  . acetaminophen (TYLENOL) 500 MG tablet Take 500 mg by mouth every 6 (six) hours as needed (pain.).  Marland Kitchen albuterol (PROVENTIL HFA;VENTOLIN HFA) 108 (90 BASE) MCG/ACT inhaler Inhale 2 puffs into the lungs every 6 (six) hours as needed for wheezing.  Marland Kitchen ALPRAZolam (XANAX) 0.25 MG tablet Take 1 tablet (0.25 mg total) by mouth daily as needed for anxiety.  . Amino Acids-Protein Hydrolys (FEEDING SUPPLEMENT, PRO-STAT 64,) LIQD Take 30 mLs by mouth in the morning, at noon, and at bedtime. To promote wound healing  . barrier cream (NON-SPECIFIED) CREA Apply 1 application topically. Apply TO BUTTOCKS WITH BATHING AND INCONTINENCE CARE  . clopidogrel (PLAVIX) 75 MG tablet Take 1 tablet (75 mg total) by mouth daily.  Marland Kitchen docusate sodium (COLACE) 100 MG capsule Take 100 mg by mouth 2 (two) times daily. For Constipation  . DULoxetine (CYMBALTA) 60 MG capsule Take 60 mg by mouth daily. For Depression  . furosemide (LASIX) 40 MG tablet Take 1 tablet (40 mg total) by mouth 2 (two) times daily.  Marland Kitchen gabapentin (NEURONTIN)  100 MG capsule Take 100 mg by mouth 3 (three) times daily. (0900, 1400, & 2100)  . insulin glargine (LANTUS) 100 UNIT/ML injection Inject 0.27 mLs (27 Units total) into the skin at bedtime.  . insulin lispro (HUMALOG) 100 UNIT/ML cartridge Inject into the skin. CHECK BS BEFORE MEALS THREE TIMES DAILY AND AT BEDTIME WITH HUMALOG SENSITIVE SSI- CBG 121-150=1U,151-200=2U,201-250=3U,251-300=5U.301-350=7,351-400=9U,GREATER THAN 400=9U,CALLMD  . magic mouthwash w/lidocaine SOLN Take 5 mLs by mouth 3 (three) times daily as needed for mouth pain.  . metoprolol tartrate (LOPRESSOR) 25 MG tablet Take 0.5 tablets (12.5 mg total) by mouth 2 (two) times daily.  . Multiple Vitamin (MULTIVITAMIN WITH MINERALS) TABS tablet Take 1 tablet by mouth daily.  . pantoprazole (PROTONIX) 40 MG tablet Take 1 tablet (40 mg total) by mouth every 12 (twelve) hours.  . polyethylene glycol (MIRALAX / GLYCOLAX) 17 g packet Take 17 g by mouth 2 (two) times daily.  . pravastatin (PRAVACHOL) 20 MG tablet Take 1 tablet (20 mg total) by mouth daily.   No facility-administered encounter medications on file as of 11/22/2020.    Review of Systems  Constitutional: Negative for activity change, fever and unexpected weight change.  HENT: Negative for dental problem, hearing loss, sore throat and trouble swallowing.   Eyes: Negative for photophobia and visual disturbance.  Respiratory: Negative for cough and shortness of breath.   Cardiovascular: Negative for chest pain and leg swelling.  Gastrointestinal: Negative for abdominal pain, constipation, diarrhea and nausea.  Endocrine: Negative for polydipsia, polyphagia and polyuria.  Genitourinary: Negative for dysuria and hematuria.  Musculoskeletal: Positive for arthralgias and myalgias.       Left toe amputation  Skin:       Dry skin  Neurological: Positive for weakness and numbness. Negative for dizziness and headaches.  Hematological: Bruises/bleeds easily.   Psychiatric/Behavioral: Positive for confusion. Negative for dysphoric mood and sleep disturbance. The patient is not nervous/anxious.     Immunization History  Administered Date(s) Administered  . Influenza Split 02/02/2012  . Influenza,inj,Quad PF,6+ Mos 10/03/2014  . PPD Test 10/30/2009  . Pneumococcal Conjugate-13 10/03/2014   Pertinent  Health Maintenance Due  Topic Date Due  . FOOT EXAM  Never done  . OPHTHALMOLOGY EXAM  Never done  . DEXA SCAN  Never done  . URINE MICROALBUMIN  10/27/2010  . PNA vac Low Risk Adult (2 of 2 - PPSV23) 10/04/2015  . INFLUENZA VACCINE  06/25/2020  . HEMOGLOBIN A1C  04/12/2021   Fall Risk  06/19/2020 07/20/2015 06/23/2014  Falls in the past year? 1 No No  Number falls in past yr: 0 - -  Injury with Fall? 1 - -  Comment bruises - -   Functional Status Survey:    There were no vitals filed for this visit. There is no height or weight on file to calculate BMI. Physical Exam Constitutional:      General: She is not in acute distress.    Appearance: Normal appearance.  HENT:     Head: Normocephalic.     Right Ear: There is no impacted cerumen.     Left Ear: There is no impacted cerumen.     Nose: Nose normal.     Mouth/Throat:     Mouth: Mucous membranes are moist.  Eyes:     General:        Right eye: No discharge.        Left eye: No discharge.  Cardiovascular:     Rate and Rhythm: Normal rate and regular rhythm.     Pulses: Normal pulses.     Heart sounds: Normal heart sounds. No murmur heard.   Pulmonary:     Effort: Pulmonary effort is normal. No respiratory distress.     Breath sounds: Normal breath sounds. No wheezing.  Abdominal:     General: Bowel sounds are normal. There is no distension.     Palpations: Abdomen is soft.     Tenderness: There is no abdominal tenderness.     Comments: Bowel sounds active x 4  Musculoskeletal:     Cervical back: Normal range of motion.     Right lower leg: Edema present.     Left  lower leg: Edema present.     Comments: +1 pitting  Lymphadenopathy:     Cervical: No cervical adenopathy.  Skin:    General: Skin is warm and dry.     Capillary Refill: Capillary refill takes less than 2 seconds.     Comments: Left great toe incision CDI, no drainage or erythema. Surrounding tissue intact.   Neurological:     General: No focal deficit present.  Mental Status: She is alert. Mental status is at baseline.     Motor: Weakness present.     Gait: Gait abnormal.     Comments: wheelchair  Psychiatric:        Mood and Affect: Mood normal.        Behavior: Behavior normal.        Cognition and Memory: Memory is impaired.     Labs reviewed: Recent Labs    03/10/20 0205 03/11/20 1326 03/12/20 0450 03/13/20 0214 03/14/20 0300 03/17/20 0345 03/18/20 0645 03/19/20 0825 03/23/20 0503 03/24/20 0650 03/25/20 0350 04/03/20 2010 09/27/20 0758 10/13/20 1016  NA 132*   < > 128* 131*   < > 130* 129*   < > 132*   < > 132* 136 140 139  K 3.3*   < > 3.2* 3.2*   < > 5.2* 4.6   < > 3.9   < > 3.9 3.5 4.0 3.7  CL 95*   < > 91* 93*   < > 100 99   < > 99   < > 101 102 100 103  CO2 25   < > 27 27   < > 20* 23   < > 25   < > 21* 23  --  25  GLUCOSE 185*   < > 309* 327*   < > 170* 257*   < > 161*   < > 81 225* 142* 127*  BUN 8   < > 10 11   < > 16 17   < > 34*   < > 28* 25* 32* 26*  CREATININE 0.94   < > 0.73 0.78   < > 0.70 0.68   < > 1.10*   < > 0.76 0.78 0.90 0.95  CALCIUM 8.6*   < > 8.7* 8.9   < > 9.4 9.2   < > 9.7   < > 9.2 9.3  --  9.7  MG 1.7   < > 1.3* 1.6*  --  1.5* 2.3  --  1.7  --   --   --   --   --   PHOS 2.6  --  1.6* 1.7*  --   --   --   --   --   --   --   --   --   --    < > = values in this interval not displayed.   Recent Labs    03/20/20 0600 03/21/20 0659 03/22/20 0414  AST 29 39 19  ALT 21 22 19   ALKPHOS 74 65 74  BILITOT 0.5 1.2 0.3  PROT 5.4* 5.1* 5.9*  ALBUMIN 2.3* 2.3* 2.6*   Recent Labs    03/03/20 0157 03/04/20 0234 03/10/20 0205  03/13/20 0214 04/03/20 2010 09/27/20 0758 10/13/20 1016 10/30/20 0000 11/02/20 0000 11/03/20 0000  WBC 15.1* 11.3*   < > 9.9 14.8*  --  12.3* 22.3 24.3 25.9  NEUTROABS 11.3* 7.1  --   --  10.0*  --   --   --   --   --   HGB 12.7 12.4   < > 11.0* 10.9*   < > 10.7* 10.3* 9.6* 11.2*  HCT 38.4 37.6   < > 33.4* 35.5*   < > 35.0* 33* 30* 36  MCV 71.8* 72.0*   < > 71.8* 80.0  --  70.4*  --   --   --   PLT 284 274   < > 262 355  --  441* 364 330 344   < > = values in this interval not displayed.   Lab Results  Component Value Date   TSH 0.545 02/28/2020   Lab Results  Component Value Date   HGBA1C 8.1 (H) 10/13/2020   Lab Results  Component Value Date   CHOL 242 (H) 06/23/2014   HDL 35 (L) 06/23/2014   LDLCALC NOT CALC 06/23/2014   TRIG 470 (H) 06/23/2014   CHOLHDL 6.9 06/23/2014    Significant Diagnostic Results in last 30 days:  VAS [name] ABI WITH/WO TBI  Result Date: 11/22/2020 LOWER EXTREMITY DOPPLER STUDY Indications: Ulceration, and peripheral artery disease. High Risk Factors: Hypertension.  Vascular Interventions: Left SFA angioplasty with stent placement and peroneal                         angioplasty 09/27/2020.                         Left foot 1st ray amputation 10/13/2020. Performing Technologist: Ronal Fear RVS, RCS  Examination Guidelines: A complete evaluation includes at minimum, Doppler waveform signals and systolic blood pressure reading at the level of bilateral brachial, anterior tibial, and posterior tibial arteries, when vessel segments are accessible. Bilateral testing is considered an integral part of a complete examination. Photoelectric Plethysmograph (PPG) waveforms and toe systolic pressure readings are included as required and additional duplex testing as needed. Limited examinations for reoccurring indications may be performed as noted.  ABI Findings: +---------+------------------+-----+--------+--------+ Right    Rt Pressure  (mmHg)IndexWaveformComment  +---------+------------------+-----+--------+--------+ Brachial 150                                     +---------+------------------+-----+--------+--------+ PTA      130               0.87                  +---------+------------------+-----+--------+--------+ DP       113               0.75                  +---------+------------------+-----+--------+--------+ Great Toe67                0.45                  +---------+------------------+-----+--------+--------+ +---------+------------------+-----+--------+----------+ Left     Lt Pressure (mmHg)IndexWaveformComment    +---------+------------------+-----+--------+----------+ Brachial 150                                       +---------+------------------+-----+--------+----------+ PTA      87                0.58                    +---------+------------------+-----+--------+----------+ DP       87                0.58                    +---------+------------------+-----+--------+----------+ Great Toe  amputation +---------+------------------+-----+--------+----------+ +-------+-----------+-----------+------------+------------+ ABI/TBIToday's ABIToday's TBIPrevious ABIPrevious TBI +-------+-----------+-----------+------------+------------+ Right  0.87       0.45       0.84        0.37         +-------+-----------+-----------+------------+------------+ Left   0.58       amputated  1.04        0.60         +-------+-----------+-----------+------------+------------+   Summary: Right: Resting right ankle-brachial index indicates mild right lower extremity arterial disease. The right toe-brachial index is abnormal. Left: Resting left ankle-brachial index indicates moderate left lower extremity arterial disease.  *See table(s) above for measurements and observations.  Electronically signed by Ruta Hinds MD on 11/22/2020 at 9:21:02  AM.    Final    VAS [name] LOWER EXTREMITY ARTERIAL DUPLEX  Result Date: 11/22/2020 LOWER EXTREMITY ARTERIAL DUPLEX STUDY Indications: Peripheral artery disease. High Risk Factors: Hypertension.  Vascular Interventions: Left foot 1st ray amputation 10/13/2020                         Left SFA stent and peroneal artery angioplasty                         09/27/2020. Current ABI:            R=0.87, L=0.58 Limitations: Patient movement and limited mobility. Performing Technologist: Ronal Fear RVS, RCS  Examination Guidelines: A complete evaluation includes B-mode imaging, spectral Doppler, color Doppler, and power Doppler as needed of all accessible portions of each vessel. Bilateral testing is considered an integral part of a complete examination. Limited examinations for reoccurring indications may be performed as noted.  Left Stent(s):SFA +---------------+---++----------++ Prox to Stent  129biphasic   +---------------+---++----------++ Proximal Stent 0             +---------------+---++----------++ Mid Stent      0             +---------------+---++----------++ Distal Stent   0             +---------------+---++----------++ Distal to [id number] monophasic +---------------+---++----------++    Summary: Left: No color or spectral Doppler flow observed within the superficial femoral artery stent.  See table(s) above for measurements and observations. Electronically signed by Ruta Hinds MD on 11/22/2020 at 9:20:40 AM.    Final     Assessment/Plan 1. Slow transit constipation - having regular bowel movements, abdomen soft with active bowel sounds - encourage hydration - continue colace, miralax  2. Type 2 diabetes with complication (HCC) - stable, blood sugars averaging around 250, no reported hypoglycemia - continue sensitive sliding scale for meal coverage and 27 units Lantus daily - continue gabapentin for neuropathy  3. Amputation of left great toe ([place]) - followed  by Dr. Sharol Given - incision CDI, no drainage or sign of infection  4. Dementia without behavioral disturbance, unspecified dementia type (HCC) - stable, she remains pleasant, no outbursts, nonambulatory -   5. PAD (peripheral artery disease) ([place]) - followed by Dr. Carlis Abbott - change in left ABI suggesting sten occlusion - continue plavix and statin - continue f/u and repeat ABI in 3 months   6. Essential hypertension - at goal <150/90 - continue metoprolol and lasix - cbc/diff - bmp  7. Chronic obstructive pulmonary disease, unspecified COPD type ([place]) - stable with no oxygen use - continue albuterol prn  8. Gastroesophageal reflux disease without esophagitis - stable without PPI - continue to  avoid food triggers    Family/ staff Communication: Plan discussed with patient and nurse  Labs/tests ordered:  Cbc/diff, bmp

## 2020-11-22 NOTE — Progress Notes (Signed)
Location:    [facility] Room Number: 418/W Place of Service:  SNF [phone]) Provider:  Hazle Nordmann NP  Laurann Montana, MD  Patient Care Team: Laurann Montana, MD as PCP - General (Family Medicine) Laurann Montana, MD as Referring Physician [facility] Medicine)  Extended Emergency Contact Information Primary Emergency Contact: Maryelizabeth Rowan Home Phone: [phone] Mobile Phone: [phone] Relation: Relative Secondary Emergency Contact: Windell Moulding Address: [address]          [place], [place] [address] Darden Amber of [place] Home Phone: [phone] Mobile Phone: [phone] Relation: Relative  Code Status:  Full Code Goals of care: Advanced Directive information Advanced Directives 11/22/2020  Does Patient Have a Medical Advance Directive? No  Type of Advance Directive -  Does patient want to make changes to medical advance directive? No - Patient declined  Would patient like information on creating a medical advance directive? -  Pre-existing out of facility DNR order (yellow form or pink MOST form) -     Chief Complaint  Patient presents with  . Medical Management of Chronic Issues    Routine Visit of Medical Management    HPI:  Pt is a 74 y.o. female seen today for medical management of chronic diseases.     Past Medical History:  Diagnosis Date  . Asthma   . Blood transfusion   . Cervicalgia   . CHF (congestive heart failure) (HCC)   . Chronic pain syndrome   . CKD (chronic kidney disease)   . COPD (chronic obstructive pulmonary disease) (HCC)   . Dementia (HCC)   . Diabetes mellitus   . Disturbance of skin sensation   . GERD (gastroesophageal reflux disease)   . Hyperlipidemia   . Hypertension   . Insomnia   . Lumbago   . Olecranon bursitis   . Pain in joint, hand   . Polyneuropathy in diabetes(357.2)    Past Surgical History:  Procedure Laterality Date  . ABDOMINAL AORTOGRAM W/LOWER EXTREMITY N/A 09/27/2020    Procedure: ABDOMINAL AORTOGRAM W/LOWER EXTREMITY;  Surgeon: Cephus Shelling, MD;  [facility] INV[facility] LAB;  Service: Cardiovascular;  Laterality: N/A;  . ABDOMINAL HYSTERECTOMY  1970's  . AMPUTATION Left 10/13/2020   Procedure: LEFT FOOT 1ST RAY AMPUTATION;  Surgeon: Nadara Mustard, MD;  Location: [facility] OR;  Service: Orthopedics;  Laterality: Left;  . BACK SURGERY     from bacteria  . BIOPSY  03/05/2020   Procedure: BIOPSY;  Surgeon: Benancio Deeds, MD;  Location: [facility] ENDOSCOPY;  Service: Gastroenterology;;  . CATARACT EXTRACTION    . ESOPHAGOGASTRODUODENOSCOPY (EGD) WITH PROPOFOL N/A 03/05/2020   Procedure: ESOPHAGOGASTRODUODENOSCOPY (EGD) WITH PROPOFOL;  Surgeon: Benancio Deeds, MD;  Location: [facility] ENDOSCOPY;  Service: Gastroenterology;  Laterality: N/A;  . PERIPHERAL VASCULAR INTERVENTION Left 09/27/2020   Procedure: PERIPHERAL VASCULAR INTERVENTION;  Surgeon: Cephus Shelling, MD;  Location: [facility] INVASIVE CV LAB;  Service: Cardiovascular;  Laterality: Left;  . SHOULDER ARTHROSCOPY Right     Allergies  Allergen Reactions  . Clonazepam Other (See Comments)    Unsteady gait, ineffective  . Trazodone And Nefazodone Other (See Comments)    Unsteady gait, disoriented    Allergies as of 11/22/2020      Reactions   Clonazepam Other (See Comments)   Unsteady gait, ineffective   Trazodone And Nefazodone Other (See Comments)   Unsteady gait, disoriented      Medication List       Accurate as of November 22, 2020 12:26 PM. If you have any questions, ask your nurse or doctor.        STOP taking these medications   HumaLOG 100 UNIT/ML cartridge Generic drug: insulin lispro Stopped by: Yvonna Alanis, NP     TAKE these medications   acetaminophen 500 MG tablet Commonly known as: TYLENOL Take 500 mg by mouth every 6 (six) hours as needed (pain.).   acetaminophen 325 MG tablet Commonly known as: TYLENOL Take 650 mg by mouth every 6 (six) hours as needed. For fever  greater than 101 degrees Fahrenheit and notify MD. (Physician Order) S/R: Routine Temperature Not to exceed 3000mg  in 24 hour period   albuterol 108 (90 Base) MCG/ACT inhaler Commonly known as: VENTOLIN HFA Inhale 2 puffs into the lungs every 6 (six) hours as needed for wheezing.   ALPRAZolam 0.25 MG tablet Commonly known as: XANAX Take 1 tablet (0.25 mg total) by mouth daily as needed for anxiety.   barrier cream Crea Commonly known as: non-specified Apply 1 application topically. Apply TO BUTTOCKS WITH BATHING AND INCONTINENCE CARE   clopidogrel 75 MG tablet Commonly known as: Plavix Take 1 tablet (75 mg total) by mouth daily.   docusate sodium 100 MG capsule Commonly known as: COLACE Take 100 mg by mouth 2 (two) times daily. For Constipation   DULoxetine 60 MG capsule Commonly known as: CYMBALTA Take 60 mg by mouth daily. For Depression   feeding supplement (PRO-STAT 64) Liqd Take 30 mLs by mouth in the morning, at noon, and at bedtime. To promote wound healing   furosemide 40 MG tablet Commonly known as: LASIX Take 1 tablet (40 mg total) by mouth 2 (two) times daily.   gabapentin 100 MG capsule Commonly known as: NEURONTIN Take 100 mg by mouth 3 (three) times daily. (0900, 1400, & 2100)   insulin glargine 100 UNIT/ML injection Commonly known as: Lantus Inject 0.27 mLs (27 Units total) into the skin at bedtime.   magic mouthwash w/lidocaine Soln Take 5 mLs by mouth 3 (three) times daily as needed for mouth pain.   metoprolol tartrate 25 MG tablet Commonly known as: LOPRESSOR Take 0.5 tablets (12.5 mg total) by mouth 2 (two) times daily.   multivitamin with minerals Tabs tablet Take 1 tablet by mouth daily.   pantoprazole 40 MG tablet Commonly known as: PROTONIX Take 1 tablet (40 mg total) by mouth every 12 (twelve) hours.   polyethylene glycol 17 g packet Commonly known as: MIRALAX / GLYCOLAX Take 17 g by mouth 2 (two) times daily.   pravastatin 20 MG  tablet Commonly known as: PRAVACHOL Take 1 tablet (20 mg total) by mouth daily.       Review of Systems  Immunization History  Administered Date(s) Administered  . Influenza Split 02/02/2012  . Influenza,inj,Quad PF,6+ Mos 10/03/2014  . PPD Test 10/30/2009  . Pneumococcal Conjugate-13 10/03/2014  . Unspecified SARS-COV-2 Vaccination 03/25/2020, 04/25/2020   Pertinent  Health Maintenance Due  Topic Date Due  . FOOT EXAM  Never done  . OPHTHALMOLOGY EXAM  Never done  . DEXA SCAN  Never done  . URINE MICROALBUMIN  10/27/2010  . PNA vac Low Risk Adult (2 of 2 - PPSV23) 10/04/2015  . INFLUENZA VACCINE  06/25/2020  . HEMOGLOBIN A1C  04/12/2021   Fall Risk  06/19/2020 07/20/2015 06/23/2014  Falls in the past year? 1 No No  Number falls in past yr: 0 - -  Injury with Fall? 1 - -  Comment bruises - -  Functional Status Survey:    Vitals:   11/22/20 1200  BP: 111/64  Pulse: 73  Resp: 18  Temp: 97.8 F (36.6 C)  Weight: 205 lb (93 kg)  Height: 5\' 1"  (1.549 m)   Body mass index is 38.73 kg/m. Physical Exam  Labs reviewed: Recent Labs    03/10/20 0205 03/11/20 1326 03/12/20 0450 03/13/20 0214 03/14/20 0300 03/17/20 0345 03/18/20 0645 03/19/20 0825 03/23/20 0503 03/24/20 0650 03/25/20 0350 04/03/20 2010 09/27/20 0758 10/13/20 1016  NA 132*   < > 128* 131*   < > 130* 129*   < > 132*   < > 132* 136 140 139  K 3.3*   < > 3.2* 3.2*   < > 5.2* 4.6   < > 3.9   < > 3.9 3.5 4.0 3.7  CL 95*   < > 91* 93*   < > 100 99   < > 99   < > 101 102 100 103  CO2 25   < > 27 27   < > 20* 23   < > 25   < > 21* 23  --  25  GLUCOSE 185*   < > 309* 327*   < > 170* 257*   < > 161*   < > 81 225* 142* 127*  BUN 8   < > 10 11   < > 16 17   < > 34*   < > 28* 25* 32* 26*  CREATININE 0.94   < > 0.73 0.78   < > 0.70 0.68   < > 1.10*   < > 0.76 0.78 0.90 0.95  CALCIUM 8.6*   < > 8.7* 8.9   < > 9.4 9.2   < > 9.7   < > 9.2 9.3  --  9.7  MG 1.7   < > 1.3* 1.6*  --  1.5* 2.3  --  1.7  --   --    --   --   --   PHOS 2.6  --  1.6* 1.7*  --   --   --   --   --   --   --   --   --   --    < > = values in this interval not displayed.   Recent Labs    03/20/20 0600 03/21/20 0659 03/22/20 0414  AST 29 39 19  ALT 21 22 19   ALKPHOS 74 65 74  BILITOT 0.5 1.2 0.3  PROT 5.4* 5.1* 5.9*  ALBUMIN 2.3* 2.3* 2.6*   Recent Labs    03/03/20 0157 03/04/20 0234 03/10/20 0205 03/13/20 0214 04/03/20 2010 09/27/20 0758 10/13/20 1016 10/30/20 0000 11/03/20 0000 11/06/20 0000 11/09/20 0000  WBC 15.1* 11.3*   < > 9.9 14.8*  --  12.3*   < > 25.9 15.9 18.3  NEUTROABS 11.3* 7.1  --   --  10.0*  --   --   --   --   --   --   HGB 12.7 12.4   < > 11.0* 10.9*   < > 10.7*   < > 11.2* 9.8* 9.5*  HCT 38.4 37.6   < > 33.4* 35.5*   < > 35.0*   < > 36 31* 30*  MCV 71.8* 72.0*   < > 71.8* 80.0  --  70.4*  --   --   --   --   PLT 284 274   < > 262 355  --  441*   < > 344 348 444*   < > = values in this interval not displayed.   Lab Results  Component Value Date   TSH 0.545 02/28/2020   Lab Results  Component Value Date   HGBA1C 8.1 (H) 10/13/2020   Lab Results  Component Value Date   CHOL 242 (H) 06/23/2014   HDL 35 (L) 06/23/2014   LDLCALC NOT CALC 06/23/2014   TRIG 470 (H) 06/23/2014   CHOLHDL 6.9 06/23/2014    Significant Diagnostic Results in last 30 days:  VAS [name] ABI WITH/WO TBI  Result Date: 11/22/2020 LOWER EXTREMITY DOPPLER STUDY Indications: Ulceration, and peripheral artery disease. High Risk Factors: Hypertension.  Vascular Interventions: Left SFA angioplasty with stent placement and peroneal                         angioplasty 09/27/2020.                         Left foot 1st ray amputation 10/13/2020. Performing Technologist: Ronal Fear RVS, RCS  Examination Guidelines: A complete evaluation includes at minimum, Doppler waveform signals and systolic blood pressure reading at the level of bilateral brachial, anterior tibial, and posterior tibial arteries, when vessel  segments are accessible. Bilateral testing is considered an integral part of a complete examination. Photoelectric Plethysmograph (PPG) waveforms and toe systolic pressure readings are included as required and additional duplex testing as needed. Limited examinations for reoccurring indications may be performed as noted.  ABI Findings: +---------+------------------+-----+--------+--------+ Right    Rt Pressure (mmHg)IndexWaveformComment  +---------+------------------+-----+--------+--------+ Brachial 150                                     +---------+------------------+-----+--------+--------+ PTA      130               0.87                  +---------+------------------+-----+--------+--------+ DP       113               0.75                  +---------+------------------+-----+--------+--------+ Great Toe67                0.45                  +---------+------------------+-----+--------+--------+ +---------+------------------+-----+--------+----------+ Left     Lt Pressure (mmHg)IndexWaveformComment    +---------+------------------+-----+--------+----------+ Brachial 150                                       +---------+------------------+-----+--------+----------+ PTA      87                0.58                    +---------+------------------+-----+--------+----------+ DP       87                0.58                    +---------+------------------+-----+--------+----------+ Great Toe  amputation +---------+------------------+-----+--------+----------+ +-------+-----------+-----------+------------+------------+ ABI/TBIToday's ABIToday's TBIPrevious ABIPrevious TBI +-------+-----------+-----------+------------+------------+ Right  0.87       0.45       0.84        0.37         +-------+-----------+-----------+------------+------------+ Left   0.58       amputated  1.04        0.60          +-------+-----------+-----------+------------+------------+   Summary: Right: Resting right ankle-brachial index indicates mild right lower extremity arterial disease. The right toe-brachial index is abnormal. Left: Resting left ankle-brachial index indicates moderate left lower extremity arterial disease.  *See table(s) above for measurements and observations.  Electronically signed by Fabienne Bruns MD on 11/22/2020 at 9:21:02 AM.    Final    VAS [name] LOWER EXTREMITY ARTERIAL DUPLEX  Result Date: 11/22/2020 LOWER EXTREMITY ARTERIAL DUPLEX STUDY Indications: Peripheral artery disease. High Risk Factors: Hypertension.  Vascular Interventions: Left foot 1st ray amputation 10/13/2020                         Left SFA stent and peroneal artery angioplasty                         09/27/2020. Current ABI:            R=0.87, L=0.58 Limitations: Patient movement and limited mobility. Performing Technologist: Dorthula Matas RVS, RCS  Examination Guidelines: A complete evaluation includes B-mode imaging, spectral Doppler, color Doppler, and power Doppler as needed of all accessible portions of each vessel. Bilateral testing is considered an integral part of a complete examination. Limited examinations for reoccurring indications may be performed as noted.  Left Stent(s):SFA +---------------+---++----------++ Prox to Stent  129biphasic   +---------------+---++----------++ Proximal Stent 0             +---------------+---++----------++ Mid Stent      0             +---------------+---++----------++ Distal Stent   0             +---------------+---++----------++ Distal to [id number] monophasic +---------------+---++----------++    Summary: Left: No color or spectral Doppler flow observed within the superficial femoral artery stent.  See table(s) above for measurements and observations. Electronically signed by Fabienne Bruns MD on 11/22/2020 at 9:20:40 AM.    Final     Assessment/Plan 1. Slow  transit constipation   2. Type 2 diabetes with complication (HCC)   3. Amputation of left great toe (HCC)   4. Dementia without behavioral disturbance, unspecified dementia type (HCC)   5. PAD (peripheral artery disease) (HCC)   6. Essential hypertension   7. Chronic obstructive pulmonary disease, unspecified COPD type (HCC)   8. Gastroesophageal reflux disease without esophagitis     Family/ staff Communication:   Labs/tests ordered:

## 2020-11-23 LAB — CBC AND DIFFERENTIAL
HCT: 30 — AB (ref 36–46)
Hemoglobin: 9.6 — AB (ref 12.0–16.0)
Platelets: 511 — AB (ref 150–399)
WBC: 9.8

## 2020-11-23 LAB — BASIC METABOLIC PANEL
BUN: 37 — AB (ref 4–21)
CO2: 21 (ref 13–22)
Chloride: 99 (ref 99–108)
Creatinine: 1.2 — AB (ref 0.5–1.1)
Glucose: 74
Potassium: 4.6 (ref 3.4–5.3)
Sodium: 135 — AB (ref 137–147)

## 2020-11-23 LAB — COMPREHENSIVE METABOLIC PANEL
Calcium: 10.1 (ref 8.7–10.7)
GFR calc Af Amer: 50.96
GFR calc non Af Amer: 43.97

## 2020-11-23 LAB — CBC: RBC: 4.65 (ref 3.87–5.11)

## 2020-11-28 ENCOUNTER — Ambulatory Visit: Payer: Medicare Other | Admitting: Physician Assistant

## 2020-12-04 ENCOUNTER — Ambulatory Visit: Payer: Medicare Other | Admitting: Orthopedic Surgery

## 2020-12-07 ENCOUNTER — Ambulatory Visit (INDEPENDENT_AMBULATORY_CARE_PROVIDER_SITE_OTHER): Payer: Medicare Other | Admitting: Physician Assistant

## 2020-12-07 ENCOUNTER — Encounter: Payer: Self-pay | Admitting: Orthopedic Surgery

## 2020-12-07 DIAGNOSIS — M869 Osteomyelitis, unspecified: Secondary | ICD-10-CM

## 2020-12-07 NOTE — Progress Notes (Signed)
Office Visit Note   Patient: Yvonne [name]           Date of Birth: 1946-08-24           MRN: [id number] Visit Date: 12/07/2020              Requested by: Harlan Stains, MD [place],  [place] [address] PCP: Harlan Stains, MD  Chief Complaint  Patient presents with  . Left Foot - Follow-up      HPI: Patient is a pleasant 75 year old woman who is 2 months status post first ray amputation.  She is at a nursing facility she has no complaints she is wearing shoes that she feels comfortable and  Assessment & Plan: Visit Diagnoses: No diagnosis found.  Plan: Patient may follow-up as needed.  Reiterated the importance to her and her facility of checking her feet daily for any changes.  Also trimmed her second onychomycotic toenail which was digging into her shoe  Follow-Up Instructions: No follow-ups on file.   Ortho Exam  Patient is alert, oriented, no adenopathy, well-dressed, normal affect, normal respiratory effort. Examination of her foot well-healed surgical incision no swelling no drainage no necrosis.  Second toe has onychomycotic nail with clawing.  After verbal consent was obtained this was trimmed to a better surface.  No longer was hitting the surface  Imaging: No results found. No images are attached to the encounter.  Labs: Lab Results  Component Value Date   HGBA1C 8.1 (H) 10/13/2020   HGBA1C 10.1 (H) 02/28/2020   HGBA1C 10.30 07/20/2015   ESRSEDRATE 102 11/06/2020   ESRSEDRATE 45 10/30/2020   REPTSTATUS 02/29/2020 FINAL 02/27/2020   GRAMSTAIN  09/05/2007    MODERATE WBC PRESENT, PREDOMINANTLY PMN RARE SQUAMOUS EPITHELIAL CELLS PRESENT FEW GRAM POSITIVE COCCI IN CHAINS IN CLUSTERS   CULT (A) 02/27/2020    >=100,000 COLONIES/mL LACTOBACILLUS SPECIES Standardized susceptibility testing for this organism is not available. Performed at [facility], [place] [address], [place], [place] [address]      Lab Results   Component Value Date   ALBUMIN 2.6 (L) 03/22/2020   ALBUMIN 2.3 (L) 03/21/2020   ALBUMIN 2.3 (L) 03/20/2020    Lab Results  Component Value Date   MG 1.7 03/23/2020   MG 2.3 03/18/2020   MG 1.5 (L) 03/17/2020   No results found for: VD25OH  No results found for: PREALBUMIN CBC EXTENDED Latest Ref Rng & Units 11/09/2020 11/06/2020 11/03/2020  WBC - 18.3 15.9 25.9  RBC 3.87 - 5.11 4.47 4.6 5.28(A)  HGB 12.0 - 16.0 9.5(A) 9.8(A) 11.2(A)  HCT 36 - 46 30(A) 31(A) 36  PLT 150 - 399 444(A) 348 344  NEUTROABS 1.7 - 7.7 K/uL - - -  LYMPHSABS 0.7 - 4.0 K/uL - - -     There is no height or weight on file to calculate BMI.  Orders:  No orders of the defined types were placed in this encounter.  No orders of the defined types were placed in this encounter.    Procedures: No procedures performed  Clinical Data: No additional findings.  ROS:  All other systems negative, except as noted in the HPI. Review of Systems  Objective: Vital Signs: There were no vitals taken for this visit.  Specialty Comments:  No specialty comments available.  PMFS History: Patient Active Problem List   Diagnosis Date Noted  . Amputation of left great toe ([place]) 10/27/2020  . Osteomyelitis ([place]) 10/13/2020  .  Acute hematogenous osteomyelitis of right foot ([place]) 10/13/2020  . Osteomyelitis of great toe of left foot ([place])   . PAD (peripheral artery disease) ([place]) 09/19/2020  . Palliative care by specialist   . Dysphagia   . Hematemesis with nausea   . DKA (diabetic ketoacidoses) 02/28/2020  . Altered mental status   . Dehydration   . Hypokalemia   . Diabetic neuropathy, painful ([place]) 07/20/2015  . Insomnia due to anxiety and fear 07/20/2015  . Anxiety disorder 10/03/2014  . Preventative health care 10/03/2014  . Type 2 diabetes mellitus without complication ([place]) 32/20/2542  . Depression 06/23/2014  . Essential hypertension 06/23/2014  . Peripheral neuropathic pain 06/23/2014  .  Gastroesophageal reflux disease without esophagitis 06/23/2014  . Dyslipidemia 06/23/2014  . Chronic neck pain 03/09/2012  . Right arm pain 03/09/2012  . Chronic low back pain 03/09/2012  . Right knee pain 03/09/2012  . Joint stiffness of hand 03/09/2012  . Encephalopathy 02/05/2012  . Hypoxia 02/01/2012  . Fever 02/01/2012  . Leukocytosis 02/01/2012  . Bronchitis 02/01/2012  . Confusion 02/01/2012  . Diarrhea 02/01/2012  . COPD (chronic obstructive pulmonary disease) ([place]) 02/01/2012  . CAP (community acquired pneumonia) 02/01/2012  . Diabetes mellitus ([place])    Past Medical History:  Diagnosis Date  . Asthma   . Blood transfusion   . Cervicalgia   . CHF (congestive heart failure) ([place])   . Chronic pain syndrome   . CKD (chronic kidney disease)   . COPD (chronic obstructive pulmonary disease) ([place])   . Dementia ([place])   . Diabetes mellitus   . Disturbance of skin sensation   . GERD (gastroesophageal reflux disease)   . Hyperlipidemia   . Hypertension   . Insomnia   . Lumbago   . Olecranon bursitis   . Pain in joint, hand   . Polyneuropathy in diabetes(357.2)     No family history on file.  Past Surgical History:  Procedure Laterality Date  . ABDOMINAL AORTOGRAM W/LOWER EXTREMITY N/A 09/27/2020   Procedure: ABDOMINAL AORTOGRAM W/LOWER EXTREMITY;  Surgeon: Marty Heck, MD;  Location: [facility] CV LAB;  Service: Cardiovascular;  Laterality: N/A;  . ABDOMINAL HYSTERECTOMY  1970's  . AMPUTATION Left 10/13/2020   Procedure: LEFT FOOT 1ST RAY AMPUTATION;  Surgeon: [name], MD;  Location: [facility];  Service: Orthopedics;  Laterality: Left;  . BACK SURGERY     from bacteria  . BIOPSY  03/05/2020   Procedure: BIOPSY;  Surgeon: Yetta Flock, MD;  Location: [facility] ENDOSCOPY;  Service: Gastroenterology;;  . CATARACT EXTRACTION    . ESOPHAGOGASTRODUODENOSCOPY (EGD) WITH PROPOFOL N/A 03/05/2020   Procedure: ESOPHAGOGASTRODUODENOSCOPY (EGD) WITH PROPOFOL;  Surgeon:  Yetta Flock, MD;  Location: [facility];  Service: Gastroenterology;  Laterality: N/A;  . PERIPHERAL VASCULAR INTERVENTION Left 09/27/2020   Procedure: PERIPHERAL VASCULAR INTERVENTION;  Surgeon: Marty Heck, MD;  Location: [place] [facility] LAB;  Service: Cardiovascular;  Laterality: Left;  . SHOULDER ARTHROSCOPY Right    Social History   Occupational History  . Not on file  Tobacco Use  . Smoking status: Former Smoker    Packs/day: 0.50    Years: 30.00    Pack years: 15.00    Types: Cigarettes  . Smokeless tobacco: Never Used  . Tobacco comment: was dx with COPD  Vaping Use  . Vaping Use: Never used  Substance and Sexual Activity  . Alcohol use: No  . Drug use: No  . Sexual activity: Not on file

## 2020-12-13 ENCOUNTER — Encounter: Payer: Self-pay | Admitting: Orthopedic Surgery

## 2020-12-13 ENCOUNTER — Non-Acute Institutional Stay (SKILLED_NURSING_FACILITY): Payer: Medicare Other | Admitting: Orthopedic Surgery

## 2020-12-13 DIAGNOSIS — F0390 Unspecified dementia without behavioral disturbance: Secondary | ICD-10-CM | POA: Diagnosis not present

## 2020-12-13 DIAGNOSIS — S98112A Complete traumatic amputation of left great toe, initial encounter: Secondary | ICD-10-CM

## 2020-12-13 DIAGNOSIS — J449 Chronic obstructive pulmonary disease, unspecified: Secondary | ICD-10-CM

## 2020-12-13 DIAGNOSIS — I10 Essential (primary) hypertension: Secondary | ICD-10-CM | POA: Diagnosis not present

## 2020-12-13 DIAGNOSIS — K5901 Slow transit constipation: Secondary | ICD-10-CM

## 2020-12-13 DIAGNOSIS — K219 Gastro-esophageal reflux disease without esophagitis: Secondary | ICD-10-CM

## 2020-12-13 DIAGNOSIS — E118 Type 2 diabetes mellitus with unspecified complications: Secondary | ICD-10-CM | POA: Diagnosis not present

## 2020-12-13 DIAGNOSIS — I739 Peripheral vascular disease, unspecified: Secondary | ICD-10-CM

## 2020-12-13 DIAGNOSIS — N390 Urinary tract infection, site not specified: Secondary | ICD-10-CM | POA: Diagnosis not present

## 2020-12-13 NOTE — Progress Notes (Signed)
Location:    [facility] Room Number: 418/W Place of Service:  SNF [phone]) Provider:  Windell Moulding NP  Harlan Stains, MD  Patient Care Team: Harlan Stains, MD as PCP - General (Family Medicine) Harlan Stains, MD as Referring Physician [facility] Medicine)  Extended Emergency Contact Information Primary Emergency Contact: Charlynne Pander Home Phone: [phone] Mobile Phone: [phone] Relation: Relative Secondary Emergency Contact: Harlan Stains Address: [address]          [place], [place] [address] Johnnette Litter of [place] Phone: [phone] Mobile Phone: [phone] Relation: Relative  Code Status:  Full Code Goals of care: Advanced Directive information Advanced Directives 12/13/2020  Does Patient Have a Medical Advance Directive? No  Type of Advance Directive -  Does patient want to make changes to medical advance directive? No - Patient declined  Would patient like information on creating a medical advance directive? -  Pre-existing out of facility DNR order (yellow form or pink MOST form) -     Chief Complaint  Patient presents with  . Medical Management of Chronic Issues    Routine Visit of Medical Management     HPI:  Pt is a 75 y.o. female seen today for medical management of chronic diseases.    She is a resident of [place], seen today at bedside. PMH includes: hypertension, PAD, COPD, dysphagia, GERD, type 2 diabetes with neuropathy, confusion, osteomyelitis with left great tow amputation, depression, and anxiety.   Facility nurse reports confusion 01/18. She thought her roommate stole her pants and became very upset. She was redirected and was able to calm down.   Today, she is alert to self and person. Disoriented to place, time and situation. Follows commands and can express needs. She appears very happy to have company. She is often seen using her wheelchair in the hallways.  Since her  move to residential hall, she has adjusted well. She is often seen talking with her roommate and they watch television together. No other behavioral outbursts reported.   01/13 she was seen by orthopedic PA for f/u on left great toe amputation. Second onychomycotic toenail was trimmed. Advised to follow up as needed in future.   No recent falls or injuries.   Recent blood pressures are as follows:   01/19- 129/72  01/18- 129/76  01/17- 114/58  Recent weights are as follows:  01/19- 206.2 lbs  12/24- 205 lbs  12/01- 206.2 lbs  Blood sugars trending 150-200s. AM blood sugar averaging 80-100. No reports of hypoglycemia.     Past Medical History:  Diagnosis Date  . Asthma   . Blood transfusion   . Cervicalgia   . CHF (congestive heart failure) ([place])   . Chronic pain syndrome   . CKD (chronic kidney disease)   . COPD (chronic obstructive pulmonary disease) ([place])   . Dementia ([place])   . Diabetes mellitus   . Disturbance of skin sensation   . GERD (gastroesophageal reflux disease)   . Hyperlipidemia   . Hypertension   . Insomnia   . Lumbago   . Olecranon bursitis   . Pain in joint, hand   . Polyneuropathy in diabetes(357.2)    Past Surgical History:  Procedure Laterality Date  . ABDOMINAL AORTOGRAM W/LOWER EXTREMITY N/A 09/27/2020   Procedure: ABDOMINAL AORTOGRAM W/LOWER EXTREMITY;  Surgeon: Marty Heck, MD;  Location: [facility];  Service: Cardiovascular;  Laterality: N/A;  . ABDOMINAL HYSTERECTOMY  1970's  .  AMPUTATION Left 10/13/2020   Procedure: LEFT FOOT 1ST RAY AMPUTATION;  Surgeon: [name], MD;  Location: [facility];  Service: Orthopedics;  Laterality: Left;  . BACK SURGERY     from bacteria  . BIOPSY  03/05/2020   Procedure: BIOPSY;  Surgeon: Yetta Flock, MD;  [facility];  Service: Gastroenterology;;  . CATARACT EXTRACTION    . ESOPHAGOGASTRODUODENOSCOPY (EGD) WITH PROPOFOL N/A 03/05/2020   Procedure:  ESOPHAGOGASTRODUODENOSCOPY (EGD) WITH PROPOFOL;  Surgeon: Yetta Flock, MD;  Location: [facility];  Service: Gastroenterology;  Laterality: N/A;  . PERIPHERAL VASCULAR INTERVENTION Left 09/27/2020   Procedure: PERIPHERAL VASCULAR INTERVENTION;  Surgeon: Marty Heck, MD;  Location: [facility];  Service: Cardiovascular;  Laterality: Left;  . SHOULDER ARTHROSCOPY Right     Allergies  Allergen Reactions  . Clonazepam Other (See Comments)    Unsteady gait, ineffective  . Trazodone And Nefazodone Other (See Comments)    Unsteady gait, disoriented    Allergies as of 12/13/2020      Reactions   Clonazepam Other (See Comments)   Unsteady gait, ineffective   Trazodone And Nefazodone Other (See Comments)   Unsteady gait, disoriented      Medication List       Accurate as of December 13, 2020 11:45 AM. If you have any questions, ask your nurse or doctor.        STOP taking these medications   ALPRAZolam 0.25 MG tablet Commonly known as: XANAX Stopped by: Yvonna Alanis, NP   feeding supplement (PRO-STAT 64) Liqd Stopped by: Yvonna Alanis, NP     TAKE these medications   acetaminophen 500 MG tablet Commonly known as: TYLENOL Take 500 mg by mouth every 6 (six) hours as needed (pain.).   acetaminophen 325 MG tablet Commonly known as: TYLENOL Take 650 mg by mouth every 6 (six) hours as needed. For fever greater than 101 degrees Fahrenheit and notify MD. (Physician Order) S/R: Routine Temperature Not to exceed 3000mg  in 24 hour period   albuterol 108 (90 Base) MCG/ACT inhaler Commonly known as: VENTOLIN HFA Inhale 2 puffs into the lungs every 6 (six) hours as needed for wheezing.   barrier cream Crea Commonly known as: non-specified Apply 1 application topically. Apply TO BUTTOCKS WITH BATHING AND INCONTINENCE CARE   clopidogrel 75 MG tablet Commonly known as: Plavix Take 1 tablet (75 mg total) by mouth daily.   docusate sodium 100 MG capsule Commonly  known as: COLACE Take 100 mg by mouth 2 (two) times daily. For Constipation   DULoxetine 60 MG capsule Commonly known as: CYMBALTA Take 60 mg by mouth daily. For Depression   furosemide 40 MG tablet Commonly known as: LASIX Take 1 tablet (40 mg total) by mouth 2 (two) times daily.   gabapentin 100 MG capsule Commonly known as: NEURONTIN Take 100 mg by mouth 3 (three) times daily. (0900, 1400, & 2100)   insulin glargine 100 UNIT/ML injection Commonly known as: Lantus Inject 0.27 mLs (27 Units total) into the skin at bedtime.   magic mouthwash w/lidocaine Soln Take 5 mLs by mouth 3 (three) times daily as needed for mouth pain.   metoprolol tartrate 25 MG tablet Commonly known as: LOPRESSOR Take 0.5 tablets (12.5 mg total) by mouth 2 (two) times daily.   multivitamin with minerals Tabs tablet Take 1 tablet by mouth daily.   pantoprazole 40 MG tablet Commonly known as: PROTONIX Take 1 tablet (40 mg total) by mouth every 12 (twelve)  hours.   polyethylene glycol 17 g packet Commonly known as: MIRALAX / GLYCOLAX Take 17 g by mouth 2 (two) times daily.   pravastatin 20 MG tablet Commonly known as: PRAVACHOL Take 1 tablet (20 mg total) by mouth daily.       Review of Systems  Constitutional: Negative for activity change, appetite change and fever.  HENT: Negative for dental problem, hearing loss and trouble swallowing.   Eyes: Negative for photophobia and visual disturbance.  Respiratory: Negative for cough, shortness of breath and wheezing.   Cardiovascular: Negative for chest pain and leg swelling.  Gastrointestinal: Negative for abdominal pain, constipation, diarrhea and nausea.  Genitourinary: Negative for dysuria, frequency and hematuria.  Musculoskeletal: Positive for arthralgias and myalgias.       Left great toe amputation  Skin:       Dry skin  Neurological: Positive for weakness. Negative for dizziness and headaches.  Psychiatric/Behavioral: Positive for  confusion. Negative for dysphoric mood. The patient is not nervous/anxious.     Immunization History  Administered Date(s) Administered  . Influenza Split 02/02/2012  . Influenza,inj,Quad PF,6+ Mos 10/03/2014  . PPD Test 10/30/2009  . Pneumococcal Conjugate-13 10/03/2014  . Unspecified SARS-COV-2 Vaccination 03/25/2020, 04/25/2020   Pertinent  Health Maintenance Due  Topic Date Due  . FOOT EXAM  Never done  . OPHTHALMOLOGY EXAM  Never done  . DEXA SCAN  Never done  . URINE MICROALBUMIN  10/27/2010  . PNA vac Low Risk Adult (2 of 2 - PPSV23) 10/04/2015  . INFLUENZA VACCINE  06/25/2020  . HEMOGLOBIN A1C  04/12/2021   Fall Risk  06/19/2020 07/20/2015 06/23/2014  Falls in the past year? 1 No No  Number falls in past yr: 0 - -  Injury with Fall? 1 - -  Comment bruises - -   Functional Status Survey:    Vitals:   12/13/20 1139  BP: (!) 107/52  Pulse: 71  Resp: 18  Temp: (!) 97.4 F (36.3 C)  Weight: 208 lb (94.3 kg)  Height: 5\' 1"  (1.549 m)   Body mass index is 39.3 kg/m. Physical Exam Vitals reviewed.  Constitutional:      Appearance: She is obese.  HENT:     Head: Normocephalic.     Right Ear: There is no impacted cerumen.     Left Ear: There is no impacted cerumen.     Nose: Nose normal.     Mouth/Throat:     Comments: Poor dentition Cardiovascular:     Rate and Rhythm: Normal rate and regular rhythm.     Pulses: Normal pulses.     Heart sounds: Normal heart sounds. No murmur heard.   Pulmonary:     Effort: Pulmonary effort is normal. No respiratory distress.     Breath sounds: Normal breath sounds. No wheezing.  Abdominal:     General: Bowel sounds are normal. There is no distension.     Palpations: Abdomen is soft.     Tenderness: There is no abdominal tenderness.  Musculoskeletal:     Cervical back: Normal range of motion.     Right lower leg: No edema.     Left lower leg: No edema.     Comments: Left great toe with surgical scar, CDI. BLE with not  open lesions or sores.   Lymphadenopathy:     Cervical: No cervical adenopathy.  Skin:    General: Skin is warm and dry.     Capillary Refill: Capillary refill takes less than 2 seconds.  Neurological:     General: No focal deficit present.     Mental Status: She is alert. Mental status is at baseline.     Motor: Weakness present.     Gait: Gait abnormal.     Comments: Wheelchair  Psychiatric:        Mood and Affect: Mood normal.        Behavior: Behavior normal.        Cognition and Memory: Memory is impaired.     Labs reviewed: Recent Labs    03/10/20 0205 03/11/20 1326 03/12/20 0450 03/13/20 0214 03/14/20 0300 03/17/20 0345 03/18/20 0645 03/19/20 0825 03/23/20 0503 03/24/20 0650 04/03/20 2010 09/27/20 0758 10/13/20 1016 11/23/20 0000  NA 132*   < > 128* 131*   < > 130* 129*   < > 132*   < > 136 140 139 135*  K 3.3*   < > 3.2* 3.2*   < > 5.2* 4.6   < > 3.9   < > 3.5 4.0 3.7 4.6  CL 95*   < > 91* 93*   < > 100 99   < > 99   < > 102 100 103 99  CO2 25   < > 27 27   < > 20* 23   < > 25   < > 23  --  25 21  GLUCOSE 185*   < > 309* 327*   < > 170* 257*   < > 161*   < > 225* 142* 127*  --   BUN 8   < > 10 11   < > 16 17   < > 34*   < > 25* 32* 26* 37*  CREATININE 0.94   < > 0.73 0.78   < > 0.70 0.68   < > 1.10*   < > 0.78 0.90 0.95 1.2*  CALCIUM 8.6*   < > 8.7* 8.9   < > 9.4 9.2   < > 9.7   < > 9.3  --  9.7 10.1  MG 1.7   < > 1.3* 1.6*  --  1.5* 2.3  --  1.7  --   --   --   --   --   PHOS 2.6  --  1.6* 1.7*  --   --   --   --   --   --   --   --   --   --    < > = values in this interval not displayed.   Recent Labs    03/20/20 0600 03/21/20 0659 03/22/20 0414  AST 29 39 19  ALT 21 22 19   ALKPHOS 74 65 74  BILITOT 0.5 1.2 0.3  PROT 5.4* 5.1* 5.9*  ALBUMIN 2.3* 2.3* 2.6*   Recent Labs    03/03/20 0157 03/04/20 0234 03/10/20 0205 03/13/20 0214 04/03/20 2010 09/27/20 0758 10/13/20 1016 10/30/20 0000 11/06/20 0000 11/09/20 0000 11/23/20 0000  WBC 15.1*  11.3*   < > 9.9 14.8*  --  12.3*   < > 15.9 18.3 9.8  NEUTROABS 11.3* 7.1  --   --  10.0*  --   --   --   --   --   --   HGB 12.7 12.4   < > 11.0* 10.9*   < > 10.7*   < > 9.8* 9.5* 9.6*  HCT 38.4 37.6   < > 33.4* 35.5*   < > 35.0*   < > 31* 30* 30*  MCV 71.8* 72.0*   < > 71.8* 80.0  --  70.4*  --   --   --   --   PLT 284 274   < > 262 355  --  441*   < > 348 444* 511*   < > = values in this interval not displayed.   Lab Results  Component Value Date   TSH 0.545 02/28/2020   Lab Results  Component Value Date   HGBA1C 8.1 (H) 10/13/2020   Lab Results  Component Value Date   CHOL 242 (H) 06/23/2014   HDL 35 (L) 06/23/2014   LDLCALC NOT CALC 06/23/2014   TRIG 470 (H) 06/23/2014   CHOLHDL 6.9 06/23/2014    Significant Diagnostic Results in last 30 days:  VAS [name] ABI WITH/WO TBI  Result Date: 11/22/2020 LOWER EXTREMITY DOPPLER STUDY Indications: Ulceration, and peripheral artery disease. High Risk Factors: Hypertension.  Vascular Interventions: Left SFA angioplasty with stent placement and peroneal                         angioplasty 09/27/2020.                         Left foot 1st ray amputation 10/13/2020. Performing Technologist: Ronal Fear RVS, RCS  Examination Guidelines: A complete evaluation includes at minimum, Doppler waveform signals and systolic blood pressure reading at the level of bilateral brachial, anterior tibial, and posterior tibial arteries, when vessel segments are accessible. Bilateral testing is considered an integral part of a complete examination. Photoelectric Plethysmograph (PPG) waveforms and toe systolic pressure readings are included as required and additional duplex testing as needed. Limited examinations for reoccurring indications may be performed as noted.  ABI Findings: +---------+------------------+-----+--------+--------+ Right    Rt Pressure (mmHg)IndexWaveformComment  +---------+------------------+-----+--------+--------+ Brachial 150                                      +---------+------------------+-----+--------+--------+ PTA      130               0.87                  +---------+------------------+-----+--------+--------+ DP       113               0.75                  +---------+------------------+-----+--------+--------+ Great Toe67                0.45                  +---------+------------------+-----+--------+--------+ +---------+------------------+-----+--------+----------+ Left     Lt Pressure (mmHg)IndexWaveformComment    +---------+------------------+-----+--------+----------+ Brachial 150                                       +---------+------------------+-----+--------+----------+ PTA      87                0.58                    +---------+------------------+-----+--------+----------+ DP       87                0.58                    +---------+------------------+-----+--------+----------+  Great Toe                               amputation +---------+------------------+-----+--------+----------+ +-------+-----------+-----------+------------+------------+ ABI/TBIToday's ABIToday's TBIPrevious ABIPrevious TBI +-------+-----------+-----------+------------+------------+ Right  0.87       0.45       0.84        0.37         +-------+-----------+-----------+------------+------------+ Left   0.58       amputated  1.04        0.60         +-------+-----------+-----------+------------+------------+   Summary: Right: Resting right ankle-brachial index indicates mild right lower extremity arterial disease. The right toe-brachial index is abnormal. Left: Resting left ankle-brachial index indicates moderate left lower extremity arterial disease.  *See table(s) above for measurements and observations.  Electronically signed by Ruta Hinds MD on 11/22/2020 at 9:21:02 AM.    Final    VAS [name] LOWER EXTREMITY ARTERIAL DUPLEX  Result Date: 11/22/2020 LOWER EXTREMITY ARTERIAL  DUPLEX STUDY Indications: Peripheral artery disease. High Risk Factors: Hypertension.  Vascular Interventions: Left foot 1st ray amputation 10/13/2020                         Left SFA stent and peroneal artery angioplasty                         09/27/2020. Current ABI:            R=0.87, L=0.58 Limitations: Patient movement and limited mobility. Performing Technologist: Ronal Fear RVS, RCS  Examination Guidelines: A complete evaluation includes B-mode imaging, spectral Doppler, color Doppler, and power Doppler as needed of all accessible portions of each vessel. Bilateral testing is considered an integral part of a complete examination. Limited examinations for reoccurring indications may be performed as noted.  Left Stent(s):SFA +---------------+---++----------++ Prox to Stent  129biphasic   +---------------+---++----------++ Proximal Stent 0             +---------------+---++----------++ Mid Stent      0             +---------------+---++----------++ Distal Stent   0             +---------------+---++----------++ Distal to [id number] monophasic +---------------+---++----------++    Summary: Left: No color or spectral Doppler flow observed within the superficial femoral artery stent.  See table(s) above for measurements and observations. Electronically signed by Ruta Hinds MD on 11/22/2020 at 9:20:40 AM.    Final     Assessment/Plan 1. Type 2 diabetes with complication (HCC) - stable, sugars ranging 150-200s, no hypoglycemia - continue lantus 27 units QHS - hemoglobin a1c- 01/13/2021 - urine microalbumin - diabetic foot exam today, 10/10 with right, 8/10 left - please schedule diabetic eye exam  2. Amputation of left great toe ([place]) - followed by ortho, no further f/u recommended  3. Dementia without behavioral disturbance, unspecified dementia type ([place]) - stable with one reported behavioral outbursts, was able to be redirected  4. PAD (peripheral artery  disease) ([place]) - followed by Dr. Carlis Abbott - no signs of swelling or ulcerations - continue plavix and asa - repeat ABI in 3 months  5. Essential hypertension - bp at goal < 150/90 - continue metoprolol and lasix  6. Chronic obstructive pulmonary disease, unspecified COPD type ([place]) - stable without oxygen - continue albuterol prn  7. Gastroesophageal reflux disease without esophagitis - stable with  PPI  8. Slow transit constipation - having regular bowel movements - reduce miralax to daily prn - continue colace regimen    Family/ staff Communication: Plan discussed with patient and facility nurse  Labs/tests ordered:  hemoglobin a1c- next month

## 2020-12-18 NOTE — Progress Notes (Signed)
HEMATOLOGY ONCOLOGY CONSULT NOTE  Patient Care Team: Harlan Stains, MD as PCP - General (Family Medicine) Harlan Stains, MD as Referring Physician (Family Medicine)  CHIEF COMPLAINTS/PURPOSE OF CONSULTATION  Lymphocytosis.  HISTORY OF PRESENTING ILLNESS:   Yvonne [name] 75 y.o. female is here because of a referral from Dr. Dema Severin from [place] [facility] regarding lymphocytosis.   She is accompanied today by her daughter in law. The pt reports that she is doing well overall. She reports not feeling any different over the last 2 years. She reports using an inhaler and taking prednisone. She has neuropathy and also has DM. Of note prior to today's visit, the pt had a severe flesh-eating infection in her groin several years ago. She reports some difficulty walking down steps with her cane and has not seen PT about her waning muscle strength in her legs.  Most recent lab results from 08/04/17 of CBC revealed a Lymph counts at 5.5k, and labs on 06/23/14 showed Lymph Abs at 4.4k.  On review of systems, pt reports stable weight, waning muscle strength (which she attributes to her inactivity) and denies fevers, chills, night sweats, abdominal pains, lymph node swelling, and any other symptoms.   On PMHx she has COPD and emphysema, and smokes 5 packs/week. She also has DM2. She reports that her DM is not well controlled and blood sugar stays consistently around 300.   Interval History  Yvonne [name] is a wonderful 75 y.o. female who is here today for management of thrombocytosis. The patient's last visit with [name] was on 12/29/2017. The pt reports that she is doing well overall.  The pt reports no new concerns or symptoms.  We talked to pt's daughter-in-law over phone for a brief period of time, who also noted no new symptoms or concerns regarding this pt.  Lab results today 12/19/2020 of CBC w/diff and CMP is as follows: all values are WNL except for WBC at 12.4K, Hgb at 10.2, HCT  of 32.6, MCV of 67.9, Mch of 21.3, RDW of 17.9, Neutro Abs of 7.8K, Sodium of 134, Glucose of 150, BUN of 29, Creatinine at 1.16, Albumin at 3.3, AST of 11, GFR est of 49.  12/19/2020 LDH of 180.  On review of systems, pt denies fevers, chills, night sweats, abdominal pain, lightheadedness, dizziness, and any other symptoms.    MEDICAL HISTORY:  Past Medical History:  Diagnosis Date   Asthma    Blood transfusion    Cervicalgia    CHF (congestive heart failure) (HCC)    Chronic pain syndrome    CKD (chronic kidney disease)    COPD (chronic obstructive pulmonary disease) (HCC)    Dementia (HCC)    Diabetes mellitus    Disturbance of skin sensation    GERD (gastroesophageal reflux disease)    Hyperlipidemia    Hypertension    Insomnia    Lumbago    Olecranon bursitis    Pain in joint, hand    Polyneuropathy in diabetes(357.2)     SURGICAL HISTORY: Past Surgical History:  Procedure Laterality Date   ABDOMINAL AORTOGRAM W/LOWER EXTREMITY N/A 09/27/2020   Procedure: ABDOMINAL AORTOGRAM W/LOWER EXTREMITY;  Surgeon: Marty Heck, MD;  Location: [facility];  Service: Cardiovascular;  Laterality: N/A;   ABDOMINAL HYSTERECTOMY  1970's   AMPUTATION Left 10/13/2020   Procedure: LEFT FOOT 1ST RAY AMPUTATION;  Surgeon: [name], MD;  Location: [facility];  Service: Orthopedics;  Laterality: Left;   BACK SURGERY  from bacteria   BIOPSY  03/05/2020   Procedure: BIOPSY;  Surgeon: Yetta Flock, MD;  Location: [facility];  Service: Gastroenterology;;   CATARACT EXTRACTION     ESOPHAGOGASTRODUODENOSCOPY (EGD) WITH PROPOFOL N/A 03/05/2020   Procedure: ESOPHAGOGASTRODUODENOSCOPY (EGD) WITH PROPOFOL;  Surgeon: Yetta Flock, MD;  Location: [facility];  Service: Gastroenterology;  Laterality: N/A;   PERIPHERAL VASCULAR INTERVENTION Left 09/27/2020   Procedure: PERIPHERAL VASCULAR INTERVENTION;  Surgeon: Marty Heck, MD;   Location: [facility];  Service: Cardiovascular;  Laterality: Left;   SHOULDER ARTHROSCOPY Right     SOCIAL HISTORY: Social History   Socioeconomic History   Marital status: Widowed    Spouse name: Not on file   Number of children: 3   Years of education: 27   Highest education level: Not on file  Occupational History   Not on file  Tobacco Use   Smoking status: Former Smoker    Packs/day: 0.50    Years: 30.00    Pack years: 15.00    Types: Cigarettes   Smokeless tobacco: Never Used   Tobacco comment: was dx with COPD  Vaping Use   Vaping Use: Never used  Substance and Sexual Activity   Alcohol use: No   Drug use: No   Sexual activity: Not on file  Other Topics Concern   Not on file  Social History Narrative   06/19/20 living at [facility]    Social Determinants of Health   Financial Resource Strain: Not on file  Food Insecurity: Not on file  Transportation Needs: Not on file  Physical Activity: Not on file  Stress: Not on file  Social Connections: Not on file  Intimate Partner Violence: Not on file    FAMILY HISTORY: No family history on file.  ALLERGIES:  is allergic to clonazepam and trazodone and nefazodone.  MEDICATIONS:  Current Outpatient Medications  Medication Sig Dispense Refill   acetaminophen (TYLENOL) 325 MG tablet Take 650 mg by mouth every 6 (six) hours as needed. For fever greater than 101 degrees Fahrenheit and notify MD. (Physician Order) S/R: Routine Temperature Not to exceed 3000mg  in 24 hour period     acetaminophen (TYLENOL) 500 MG tablet Take 500 mg by mouth every 6 (six) hours as needed (pain.).     albuterol (PROVENTIL HFA;VENTOLIN HFA) 108 (90 BASE) MCG/ACT inhaler Inhale 2 puffs into the lungs every 6 (six) hours as needed for wheezing. 1 Inhaler 3   barrier cream (NON-SPECIFIED) CREA Apply 1 application topically. Apply TO BUTTOCKS WITH BATHING AND INCONTINENCE CARE     clopidogrel (PLAVIX) 75 MG tablet  Take 1 tablet (75 mg total) by mouth daily. 30 tablet 11   docusate sodium (COLACE) 100 MG capsule Take 100 mg by mouth 2 (two) times daily. For Constipation     DULoxetine (CYMBALTA) 60 MG capsule Take 60 mg by mouth daily. For Depression     furosemide (LASIX) 40 MG tablet Take 1 tablet (40 mg total) by mouth 2 (two) times daily. 30 tablet 0   gabapentin (NEURONTIN) 100 MG capsule Take 100 mg by mouth 3 (three) times daily. (0900, 1400, & 2100)     insulin glargine (LANTUS) 100 UNIT/ML injection Inject 0.27 mLs (27 Units total) into the skin at bedtime. 30 mL 3   magic mouthwash w/lidocaine SOLN Take 5 mLs by mouth 3 (three) times daily as needed for mouth pain.  0   metoprolol tartrate (LOPRESSOR) 25 MG tablet Take 0.5 tablets (12.5  mg total) by mouth 2 (two) times daily.     Multiple Vitamin (MULTIVITAMIN WITH MINERALS) TABS tablet Take 1 tablet by mouth daily.     pantoprazole (PROTONIX) 40 MG tablet Take 1 tablet (40 mg total) by mouth every 12 (twelve) hours.     polyethylene glycol (MIRALAX / GLYCOLAX) 17 g packet Take 17 g by mouth 2 (two) times daily.     pravastatin (PRAVACHOL) 20 MG tablet Take 1 tablet (20 mg total) by mouth daily. 90 tablet 3   No current facility-administered medications for this visit.    REVIEW OF SYSTEMS:   10 Point review of Systems was done is negative except as noted above.  PHYSICAL EXAMINATION:  Vitals:   12/19/20 1140  BP: (!) 149/69  Pulse: 71  Resp: (!) 71  Temp: 98.1 F (36.7 C)  SpO2: 100%   Filed Weights   12/19/20 1140  Weight: 207 lb 11.2 oz (94.2 kg)    Exam was given in a wheelchair.  GENERAL:alert, in no acute distress and comfortable SKIN: no acute rashes, no significant lesions EYES: conjunctiva are pink and non-injected, sclera anicteric OROPHARYNX: MMM, no exudates, no oropharyngeal erythema or ulceration NECK: supple, no JVD LYMPH:  no palpable lymphadenopathy in the cervical, axillary or inguinal  regions LUNGS: clear to auscultation b/l with normal respiratory effort HEART: regular rate & rhythm ABDOMEN:  normoactive bowel sounds , non tender, not distended. Extremity: 1+ leg swelling b/l PSYCH: alert & oriented x 3 with fluent speech NEURO: no focal motor/sensory deficits    LABORATORY DATA:   . CBC Latest Ref Rng & Units 12/19/2020 11/23/2020 11/09/2020  WBC 4.0 - 10.5 K/uL 12.4(H) 9.8 18.3  Hemoglobin 12.0 - 15.0 g/dL 10.2(L) 9.6(A) 9.5(A)  Hematocrit 36.0 - 46.0 % 32.6(L) 30(A) 30(A)  Platelets 150 - 400 K/uL 352 511(A) 444(A)   . CBC    Component Value Date/Time   WBC 12.4 (H) 12/19/2020 1114   RBC 4.80 12/19/2020 1114   HGB 10.2 (L) 12/19/2020 1114   HGB 13.2 01/16/2009 1535   HCT 32.6 (L) 12/19/2020 1114   HCT 42.8 01/16/2009 1535   PLT 352 12/19/2020 1114   PLT 289 01/16/2009 1535   MCV 67.9 (L) 12/19/2020 1114   MCV 77 (L) 01/16/2009 1535   MCH 21.3 (L) 12/19/2020 1114   MCHC 31.3 12/19/2020 1114   RDW 17.9 (H) 12/19/2020 1114   RDW 11.7 01/16/2009 1535   LYMPHSABS 3.4 12/19/2020 1114   LYMPHSABS 3.1 01/16/2009 1535   MONOABS 0.7 12/19/2020 1114   EOSABS 0.4 12/19/2020 1114   EOSABS 0.2 01/16/2009 1535   BASOSABS 0.1 12/19/2020 1114   BASOSABS 0.1 01/16/2009 1535    . CMP Latest Ref Rng & Units 12/19/2020 11/23/2020 10/13/2020  Glucose 70 - 99 mg/dL 150(H) - 127(H)  BUN 8 - 23 mg/dL 29(H) 37(A) 26(H)  Creatinine 0.44 - 1.00 mg/dL 1.16(H) 1.2(A) 0.95  Sodium 135 - 145 mmol/L 134(L) 135(A) 139  Potassium 3.5 - 5.1 mmol/L 4.0 4.6 3.7  Chloride 98 - 111 mmol/L 102 99 103  CO2 22 - 32 mmol/L 22 21 25   Calcium 8.9 - 10.3 mg/dL 9.5 10.1 9.7  Total Protein 6.5 - 8.1 g/dL 6.8 - -  Total Bilirubin 0.3 - 1.2 mg/dL 0.3 - -  Alkaline Phos 38 - 126 U/L 87 - -  AST 15 - 41 U/L 11(L) - -  ALT 0 - 44 U/L 6 - -   . Lab Results  Component  Value Date   LDH 180 12/19/2020   . Lab Results  Component Value Date   IRON 52 12/19/2020   TIBC 273 12/19/2020    IRONPCTSAT 19 (L) 12/19/2020   (Iron and TIBC)  Lab Results  Component Value Date   FERRITIN 202 12/19/2020       RADIOGRAPHIC STUDIES: I have personally reviewed the radiological images as listed and agreed with the findings in the report. VAS [name] ABI WITH/WO TBI  Result Date: 11/22/2020 LOWER EXTREMITY DOPPLER STUDY Indications: Ulceration, and peripheral artery disease. High Risk Factors: Hypertension.  Vascular Interventions: Left SFA angioplasty with stent placement and peroneal                         angioplasty 09/27/2020.                         Left foot 1st ray amputation 10/13/2020. Performing Technologist: Ronal Fear RVS, RCS  Examination Guidelines: A complete evaluation includes at minimum, Doppler waveform signals and systolic blood pressure reading at the level of bilateral brachial, anterior tibial, and posterior tibial arteries, when vessel segments are accessible. Bilateral testing is considered an integral part of a complete examination. Photoelectric Plethysmograph (PPG) waveforms and toe systolic pressure readings are included as required and additional duplex testing as needed. Limited examinations for reoccurring indications may be performed as noted.  ABI Findings: +---------+------------------+-----+--------+--------+  Right     Rt Pressure (mmHg) Index Waveform Comment   +---------+------------------+-----+--------+--------+  Brachial  150                                         +---------+------------------+-----+--------+--------+  PTA       130                0.87                     +---------+------------------+-----+--------+--------+  DP        113                0.75                     +---------+------------------+-----+--------+--------+  Great Toe 67                 0.45                     +---------+------------------+-----+--------+--------+ +---------+------------------+-----+--------+----------+  Left      Lt Pressure (mmHg) Index Waveform Comment      +---------+------------------+-----+--------+----------+  Brachial  150                                           +---------+------------------+-----+--------+----------+  PTA       87                 0.58                       +---------+------------------+-----+--------+----------+  DP        87                 0.58                       +---------+------------------+-----+--------+----------+  Great Toe                                   amputation  +---------+------------------+-----+--------+----------+ +-------+-----------+-----------+------------+------------+  ABI/TBI Today's ABI Today's TBI Previous ABI Previous TBI  +-------+-----------+-----------+------------+------------+  Right   0.87        0.45        0.84         0.37          +-------+-----------+-----------+------------+------------+  Left    0.58        amputated   1.04         0.60          +-------+-----------+-----------+------------+------------+   Summary: Right: Resting right ankle-brachial index indicates mild right lower extremity arterial disease. The right toe-brachial index is abnormal. Left: Resting left ankle-brachial index indicates moderate left lower extremity arterial disease.  *See table(s) above for measurements and observations.  Electronically signed by Ruta Hinds MD on 11/22/2020 at 9:21:02 AM.    Final    VAS [name] LOWER EXTREMITY ARTERIAL DUPLEX  Result Date: 11/22/2020 LOWER EXTREMITY ARTERIAL DUPLEX STUDY Indications: Peripheral artery disease. High Risk Factors: Hypertension.  Vascular Interventions: Left foot 1st ray amputation 10/13/2020                         Left SFA stent and peroneal artery angioplasty                         09/27/2020. Current ABI:            R=0.87, L=0.58 Limitations: Patient movement and limited mobility. Performing Technologist: Ronal Fear RVS, RCS  Examination Guidelines: A complete evaluation includes B-mode imaging, spectral Doppler, color Doppler, and power Doppler as  needed of all accessible portions of each vessel. Bilateral testing is considered an integral part of a complete examination. Limited examinations for reoccurring indications may be performed as noted.  Left Stent(s):SFA +---------------+---++----------++  Prox to Stent   129  biphasic     +---------------+---++----------++  Proximal Stent  0                 +---------------+---++----------++  Mid Stent       0                 +---------------+---++----------++  Distal Stent    0                 +---------------+---++----------++  Distal to Stent 45   monophasic   +---------------+---++----------++    Summary: Left: No color or spectral Doppler flow observed within the superficial femoral artery stent.  See table(s) above for measurements and observations. Electronically signed by Ruta Hinds MD on 11/22/2020 at 9:20:40 AM.    Final     ASSESSMENT & PLAN:   75 y.o. is a  female with   1.Lymphocytosis - noted incidentally on routine labs. Today lymphocyte count 3.4k -- no elevated. 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 No overt fevers chills night sweats unexpected weight loss. No associated anemia or thrombocytopenia noted. LDH within normal limits  2. Thrombocytosis - likely reactive. Normal on labs today PLAN -Discussed pt labwork today, 12/19/2020; WBC stable,  - no evidence of progression of patients Monoclonal B lymphocytosis at this time. -Will see back in 12 months with labs.  2. RBC microcytosis with mild anemia  Previous hemoglobin electrophoresis - no overt abnormality. . Lab Results  Component Value Date   IRON 52 12/19/2020   TIBC 273 12/19/2020   IRONPCTSAT 19 (L) 12/19/2020   (Iron and TIBC)  Lab Results  Component Value Date   FERRITIN 202 12/19/2020   PLAN -could be anemia of chronic disease - unclear etiology -ferritin 202.iron sat 19%. -GI workup -- per patients goals of care -- will defer to PCP . RTC with Dr Irene Limbo with labs  in 12 months (patient needs caregiver to be present for appointment due to dementia)   The total time spent in the appointment was 20 minutes and more than 50% was on counseling and direct patient cares  I, Reinaldo Raddle, am acting as scribe for Dr. Sullivan Lone, MD.   .I have reviewed the above documentation for accuracy and completeness, and I agree with the above. Brunetta Genera MD

## 2020-12-19 ENCOUNTER — Other Ambulatory Visit: Payer: Self-pay

## 2020-12-19 ENCOUNTER — Inpatient Hospital Stay: Payer: Medicare Other | Attending: Hematology

## 2020-12-19 ENCOUNTER — Other Ambulatory Visit: Payer: Self-pay | Admitting: Hematology

## 2020-12-19 ENCOUNTER — Inpatient Hospital Stay (HOSPITAL_BASED_OUTPATIENT_CLINIC_OR_DEPARTMENT_OTHER): Payer: Medicare Other | Admitting: Hematology

## 2020-12-19 ENCOUNTER — Other Ambulatory Visit: Payer: Self-pay | Admitting: *Deleted

## 2020-12-19 VITALS — BP 149/69 | HR 71 | Temp 98.1°F | Resp 71 | Ht 61.0 in | Wt 207.7 lb

## 2020-12-19 DIAGNOSIS — C9110 Chronic lymphocytic leukemia of B-cell type not having achieved remission: Secondary | ICD-10-CM

## 2020-12-19 DIAGNOSIS — G629 Polyneuropathy, unspecified: Secondary | ICD-10-CM | POA: Insufficient documentation

## 2020-12-19 DIAGNOSIS — K219 Gastro-esophageal reflux disease without esophagitis: Secondary | ICD-10-CM | POA: Insufficient documentation

## 2020-12-19 DIAGNOSIS — I10 Essential (primary) hypertension: Secondary | ICD-10-CM | POA: Insufficient documentation

## 2020-12-19 DIAGNOSIS — D72820 Lymphocytosis (symptomatic): Secondary | ICD-10-CM

## 2020-12-19 DIAGNOSIS — E119 Type 2 diabetes mellitus without complications: Secondary | ICD-10-CM | POA: Insufficient documentation

## 2020-12-19 DIAGNOSIS — I739 Peripheral vascular disease, unspecified: Secondary | ICD-10-CM | POA: Insufficient documentation

## 2020-12-19 DIAGNOSIS — F17210 Nicotine dependence, cigarettes, uncomplicated: Secondary | ICD-10-CM | POA: Diagnosis not present

## 2020-12-19 DIAGNOSIS — F0390 Unspecified dementia without behavioral disturbance: Secondary | ICD-10-CM | POA: Insufficient documentation

## 2020-12-19 DIAGNOSIS — J45909 Unspecified asthma, uncomplicated: Secondary | ICD-10-CM | POA: Insufficient documentation

## 2020-12-19 DIAGNOSIS — R262 Difficulty in walking, not elsewhere classified: Secondary | ICD-10-CM | POA: Insufficient documentation

## 2020-12-19 DIAGNOSIS — J439 Emphysema, unspecified: Secondary | ICD-10-CM | POA: Diagnosis not present

## 2020-12-19 LAB — CMP (CANCER CENTER ONLY)
ALT: 6 U/L (ref 0–44)
AST: 11 U/L — ABNORMAL LOW (ref 15–41)
Albumin: 3.3 g/dL — ABNORMAL LOW (ref 3.5–5.0)
Alkaline Phosphatase: 87 U/L (ref 38–126)
Anion gap: 10 (ref 5–15)
BUN: 29 mg/dL — ABNORMAL HIGH (ref 8–23)
CO2: 22 mmol/L (ref 22–32)
Calcium: 9.5 mg/dL (ref 8.9–10.3)
Chloride: 102 mmol/L (ref 98–111)
Creatinine: 1.16 mg/dL — ABNORMAL HIGH (ref 0.44–1.00)
GFR, Estimated: 49 mL/min — ABNORMAL LOW (ref >60)
Glucose, Bld: 150 mg/dL — ABNORMAL HIGH (ref 70–99)
Potassium: 4 mmol/L (ref 3.5–5.1)
Sodium: 134 mmol/L — ABNORMAL LOW (ref 135–145)
Total Bilirubin: 0.3 mg/dL (ref 0.3–1.2)
Total Protein: 6.8 g/dL (ref 6.5–8.1)

## 2020-12-19 LAB — CBC WITH DIFFERENTIAL/PLATELET
Abs Immature Granulocytes: 0.04 10*3/uL (ref 0.00–0.07)
Basophils Absolute: 0.1 10*3/uL (ref 0.0–0.1)
Basophils Relative: 1 %
Eosinophils Absolute: 0.4 10*3/uL (ref 0.0–0.5)
Eosinophils Relative: 3 %
HCT: 32.6 % — ABNORMAL LOW (ref 36.0–46.0)
Hemoglobin: 10.2 g/dL — ABNORMAL LOW (ref 12.0–15.0)
Immature Granulocytes: 0 %
Lymphocytes Relative: 28 %
Lymphs Abs: 3.4 10*3/uL (ref 0.7–4.0)
MCH: 21.3 pg — ABNORMAL LOW (ref 26.0–34.0)
MCHC: 31.3 g/dL (ref 30.0–36.0)
MCV: 67.9 fL — ABNORMAL LOW (ref 80.0–100.0)
Monocytes Absolute: 0.7 10*3/uL (ref 0.1–1.0)
Monocytes Relative: 5 %
Neutro Abs: 7.8 10*3/uL — ABNORMAL HIGH (ref 1.7–7.7)
Neutrophils Relative %: 63 %
Platelets: 352 10*3/uL (ref 150–400)
RBC: 4.8 MIL/uL (ref 3.87–5.11)
RDW: 17.9 % — ABNORMAL HIGH (ref 11.5–15.5)
WBC: 12.4 10*3/uL — ABNORMAL HIGH (ref 4.0–10.5)
nRBC: 0 % (ref 0.0–0.2)

## 2020-12-19 LAB — LACTATE DEHYDROGENASE: LDH: 180 U/L (ref 98–192)

## 2020-12-19 LAB — IRON AND TIBC
Iron: 52 ug/dL (ref 41–142)
Saturation Ratios: 19 % — ABNORMAL LOW (ref 21–57)
TIBC: 273 ug/dL (ref 236–444)
UIBC: 220 ug/dL (ref 120–384)

## 2020-12-19 LAB — FERRITIN: Ferritin: 202 ng/mL (ref 11–307)

## 2020-12-20 DIAGNOSIS — N390 Urinary tract infection, site not specified: Secondary | ICD-10-CM | POA: Diagnosis not present

## 2020-12-20 DIAGNOSIS — I10 Essential (primary) hypertension: Secondary | ICD-10-CM | POA: Diagnosis not present

## 2020-12-20 LAB — MICROALBUMIN, URINE: Microalb, Ur: 2.4

## 2021-01-10 ENCOUNTER — Telehealth: Payer: Self-pay | Admitting: Family
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
## 2021-01-11 ENCOUNTER — Non-Acute Institutional Stay (SKILLED_NURSING_FACILITY): Payer: Medicare Other | Admitting: Orthopedic Surgery

## 2021-01-11 ENCOUNTER — Encounter: Payer: Self-pay | Admitting: Orthopedic Surgery

## 2021-01-11 DIAGNOSIS — F22 Delusional disorders: Secondary | ICD-10-CM
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
## 2021-01-12 DIAGNOSIS — I10 Essential (primary) hypertension: Secondary | ICD-10-CM | POA: Diagnosis not present

## 2021-01-12 LAB — CBC AND DIFFERENTIAL
HCT: 33 — AB (ref 36–46)
Hemoglobin: 10.3 — AB (ref 12.0–16.0)
Platelets: 349 (ref 150–399)
WBC: 9.5

## 2021-01-12 LAB — CBC: RBC: 4.77 (ref 3.87–5.11)

## 2021-01-13 DIAGNOSIS — E119 Type 2 diabetes mellitus without complications: Secondary | ICD-10-CM | POA: Diagnosis not present

## 2021-01-13 LAB — HEMOGLOBIN A1C: Hemoglobin A1C: 7.8

## 2021-01-14 LAB — HM HEPATITIS C SCREENING LAB: HM Hepatitis Screen: NEGATIVE

## 2021-01-24 ENCOUNTER — Encounter: Payer: Self-pay | Admitting: Orthopedic Surgery

## 2021-01-24 ENCOUNTER — Non-Acute Institutional Stay (SKILLED_NURSING_FACILITY): Payer: Medicare Other | Admitting: Orthopedic Surgery

## 2021-01-24 DIAGNOSIS — K219 Gastro-esophageal reflux disease without esophagitis: Secondary | ICD-10-CM

## 2021-01-24 DIAGNOSIS — F0390 Unspecified dementia without behavioral disturbance: Secondary | ICD-10-CM

## 2021-01-24 DIAGNOSIS — I10 Essential (primary) hypertension: Secondary | ICD-10-CM | POA: Diagnosis not present

## 2021-01-24 DIAGNOSIS — J449 Chronic obstructive pulmonary disease, unspecified: Secondary | ICD-10-CM | POA: Diagnosis not present

## 2021-01-24 DIAGNOSIS — K5901 Slow transit constipation: Secondary | ICD-10-CM | POA: Diagnosis not present

## 2021-01-24 DIAGNOSIS — E118 Type 2 diabetes mellitus with unspecified complications: Secondary | ICD-10-CM

## 2021-01-24 DIAGNOSIS — I739 Peripheral vascular disease, unspecified: Secondary | ICD-10-CM
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
## 2021-02-03 ENCOUNTER — Other Ambulatory Visit: Payer: Self-pay | Admitting: Internal Medicine

## 2021-02-16 ENCOUNTER — Other Ambulatory Visit: Payer: Self-pay | Admitting: Internal Medicine

## 2021-02-20 ENCOUNTER — Ambulatory Visit (HOSPITAL_COMMUNITY)
Admission: RE | Admit: 2021-02-20 | Discharge: 2021-02-20 | Disposition: A | Discharge status: Home / Self Care | Payer: Medicare Other | Source: Ambulatory Visit | Attending: Vascular Surgery | Admitting: Vascular Surgery

## 2021-02-20 ENCOUNTER — Other Ambulatory Visit: Payer: Self-pay

## 2021-02-20 ENCOUNTER — Ambulatory Visit (INDEPENDENT_AMBULATORY_CARE_PROVIDER_SITE_OTHER): Payer: Medicare Other | Admitting: Physician Assistant

## 2021-02-20 VITALS — BP 105/58 | HR 65 | Temp 97.3°F | Resp 20 | Ht 61.0 in

## 2021-02-20 DIAGNOSIS — I739 Peripheral vascular disease, unspecified: Secondary | ICD-10-CM | POA: Diagnosis not present

## 2021-02-20 NOTE — Progress Notes (Signed)
Office Note     CC:  follow up Requesting Provider:  Harlan Stains, MD  HPI: Yvonne [name] is a 75 y.o. (August 29, 1946) female who presents for evaluation of PAD.  She underwent left SFA stenting due to great toe ulceration by Dr. Carlis Abbott in November 2021.  2 weeks later she had a first toe ray amputation by Dr. Sharol Given.  Her radiation healed then subsequently her left SFA stent thrombosed.  She denies any rest pain or new tissue loss to her left foot.  She is currently in a skilled nursing facility in [place] however the plan is to return home soon.  She is a current everyday smoker with no interest in quitting.  She is on Plavix daily.  Past medical history also significant for insulin-dependent diabetes mellitus.   Past Medical History:  Diagnosis Date  . Asthma   . Blood transfusion   . Cervicalgia   . CHF (congestive heart failure) ([place])   . Chronic pain syndrome   . CKD (chronic kidney disease)   . COPD (chronic obstructive pulmonary disease) ([place])   . Dementia ([place])   . Diabetes mellitus   . Disturbance of skin sensation   . GERD (gastroesophageal reflux disease)   . Hyperlipidemia   . Hypertension   . Insomnia   . Lumbago   . Olecranon bursitis   . Pain in joint, hand   . Polyneuropathy in diabetes(357.2)     Past Surgical History:  Procedure Laterality Date  . ABDOMINAL AORTOGRAM W/LOWER EXTREMITY N/A 09/27/2020   Procedure: ABDOMINAL AORTOGRAM W/LOWER EXTREMITY;  Surgeon: Marty Heck, MD;  Location: [facility];  Service: Cardiovascular;  Laterality: N/A;  . ABDOMINAL HYSTERECTOMY  1970's  . AMPUTATION Left 10/13/2020   Procedure: LEFT FOOT 1ST RAY AMPUTATION;  Surgeon: [name], MD;  Location: [facility];  Service: Orthopedics;  Laterality: Left;  . BACK SURGERY     from bacteria  . BIOPSY  03/05/2020   Procedure: BIOPSY;  Surgeon: Yetta Flock, MD;  Location: [facility]COPY;  Service: Gastroenterology;;  . CATARACT EXTRACTION    .  ESOPHAGOGASTRODUODENOSCOPY (EGD) WITH PROPOFOL N/A 03/05/2020   Procedure: ESOPHAGOGASTRODUODENOSCOPY (EGD) WITH PROPOFOL;  Surgeon: Yetta Flock, MD;  Location: [facility];  Service: Gastroenterology;  Laterality: N/A;  . PERIPHERAL VASCULAR INTERVENTION Left 09/27/2020   Procedure: PERIPHERAL VASCULAR INTERVENTION;  Surgeon: Marty Heck, MD;  Location: [facility];  Service: Cardiovascular;  Laterality: Left;  . SHOULDER ARTHROSCOPY Right     Social History   Socioeconomic History  . Marital status: Widowed    Spouse name: Not on file  . Number of children: 3  . Years of education: 90  . Highest education level: Not on file  Occupational History  . Not on file  Tobacco Use  . Smoking status: Former Smoker    Packs/day: 0.50    Years: 30.00    Pack years: 15.00    Types: Cigarettes  . Smokeless tobacco: Never Used  . Tobacco comment: was dx with COPD  Vaping Use  . Vaping Use: Never used  Substance and Sexual Activity  . Alcohol use: No  . Drug use: No  . Sexual activity: Not on file  Other Topics Concern  . Not on file  Social History Narrative   06/19/20 living at [facility]    Social Determinants of Health   Financial Resource Strain: Not on file  Food Insecurity: Not on file  Transportation  Needs: Not on file  Physical Activity: Not on file  Stress: Not on file  Social Connections: Not on file  Intimate Partner Violence: Not on file   History reviewed. No pertinent family history.  Current Outpatient Medications  Medication Sig Dispense Refill  . acetaminophen (TYLENOL) 500 MG tablet Take 500 mg by mouth every 6 (six) hours as needed (pain.).    Marland Kitchen albuterol (PROVENTIL HFA;VENTOLIN HFA) 108 (90 BASE) MCG/ACT inhaler Inhale 2 puffs into the lungs every 6 (six) hours as needed for wheezing. 1 Inhaler 3  . barrier cream (NON-SPECIFIED) CREA Apply 1 application topically. Apply TO BUTTOCKS WITH BATHING AND INCONTINENCE CARE    .  clopidogrel (PLAVIX) 75 MG tablet Take 1 tablet (75 mg total) by mouth daily. 30 tablet 11  . divalproex (DEPAKOTE SPRINKLE) 125 MG capsule Take 125 mg by mouth 2 (two) times daily.    . divalproex (DEPAKOTE) 125 MG DR tablet Take 125 mg by mouth 2 (two) times daily.    Marland Kitchen docusate sodium (COLACE) 100 MG capsule Take 100 mg by mouth 2 (two) times daily. For Constipation    . DULoxetine (CYMBALTA) 30 MG capsule Take 30 mg by mouth daily.    . DULoxetine (CYMBALTA) 60 MG capsule Take 60 mg by mouth daily. For Depression    . furosemide (LASIX) 40 MG tablet Take 1 tablet (40 mg total) by mouth 2 (two) times daily. 30 tablet 0  . gabapentin (NEURONTIN) 100 MG capsule Take 100 mg by mouth 3 (three) times daily. (0900, 1400, & 2100)    . HYDROcodone-acetaminophen (NORCO/VICODIN) 5-325 MG tablet Take 1 tablet by mouth 3 (three) times daily as needed.    . insulin glargine (LANTUS) 100 UNIT/ML injection Inject 0.27 mLs (27 Units total) into the skin at bedtime. 30 mL 3  . insulin lispro (HUMALOG) 100 UNIT/ML KwikPen Inject into the skin 4 (four) times daily -  before meals and at bedtime. CBG 121-150=1 U, 151-200= 2 U, 201-250= 3 U, 251-300=5 U, 301-350= 7 U, 351-400= 9 U, GREATER THAN 400= 9 U, CALL MD    . magic mouthwash w/lidocaine SOLN Take 5 mLs by mouth 3 (three) times daily as needed for mouth pain.  0  . metoprolol tartrate (LOPRESSOR) 25 MG tablet Take 0.5 tablets (12.5 mg total) by mouth 2 (two) times daily.    . Multiple Vitamin (MULTIVITAMIN WITH MINERALS) TABS tablet Take 1 tablet by mouth daily.    . pantoprazole (PROTONIX) 40 MG tablet Take 1 tablet (40 mg total) by mouth every 12 (twelve) hours.    . polyethylene glycol (MIRALAX / GLYCOLAX) 17 g packet Take 17 g by mouth daily.    . pravastatin (PRAVACHOL) 20 MG tablet Take 20 mg by mouth daily.     No current facility-administered medications for this visit.    Allergies  Allergen Reactions  . Clonazepam Other (See Comments)     Unsteady gait, ineffective  . Trazodone And Nefazodone Other (See Comments)    Unsteady gait, disoriented     REVIEW OF SYSTEMS:   [X]  denotes positive finding, [ ]  denotes negative finding Cardiac  Comments:  Chest pain or chest pressure:    Shortness of breath upon exertion:    Short of breath when lying flat:    Irregular heart rhythm:        Vascular    Pain in calf, thigh, or hip brought on by ambulation:    Pain in feet at night that wakes you  up from your sleep:     Blood clot in your veins:    Leg swelling:         Pulmonary    Oxygen at home:    Productive cough:     Wheezing:         Neurologic    Sudden weakness in arms or legs:     Sudden numbness in arms or legs:     Sudden onset of difficulty speaking or slurred speech:    Temporary loss of vision in one eye:     Problems with dizziness:         Gastrointestinal    Blood in stool:     Vomited blood:         Genitourinary    Burning when urinating:     Blood in urine:        Psychiatric    Major depression:         Hematologic    Bleeding problems:    Problems with blood clotting too easily:        Skin    Rashes or ulcers:        Constitutional    Fever or chills:      PHYSICAL EXAMINATION:  Vitals:   02/20/21 1054  BP: (!) 105/58  Pulse: 65  Resp: 20  Temp: (!) 97.3 F (36.3 C)  TempSrc: Temporal  SpO2: 100%  Height: 5\' 1"  (1.549 m)    General:  WDWN in NAD; vital signs documented above Gait: Not observed HENT: WNL, normocephalic Pulmonary: normal non-labored breathing  Cardiac: regular HR Abdomen: soft, NT, no masses Skin: without rashes Vascular Exam/Pulses:  Right Left  Radial 2+ (normal) 2+ (normal)  DP absent absent  PT absent absent   Extremities: Left great toe amputation well-healed; no areas of wounds or signs of infection of bilateral lower extremities; feet are symmetrically warm to touch Musculoskeletal: no muscle wasting or atrophy  Neurologic: A&O X 3;   No focal weakness or paresthesias are detected Psychiatric:  The pt has Normal affect.   Non-Invasive Vascular Imaging:   Right ABI 0.6 left ABI 0.5    ASSESSMENT/PLAN:: 75 y.o. female here for surveillance of PAD  Left ABI of 0.5 suggests occluded left SFA stent however left foot ray amputation is healed and patient has no new rest pain or tissue loss.  There is no indication for revascularization of left SFA stent at this time.  I encouraged smoking cessation.  Patient will continue her Plavix daily.  She will follow-up in 1 year for recheck of ABIs.  Patient knows to call/return office sooner if she develops rest pain or tissue loss of bilateral lower extremities.   Dagoberto Ligas, PA-C [facility] [phone]  Clinic MD:   Carlis Abbott

## 2021-03-07 DIAGNOSIS — J449 Chronic obstructive pulmonary disease, unspecified: Secondary | ICD-10-CM | POA: Diagnosis not present

## 2021-03-07 DIAGNOSIS — M86672 Other chronic osteomyelitis, left ankle and foot: Secondary | ICD-10-CM | POA: Diagnosis not present

## 2021-03-07 DIAGNOSIS — I739 Peripheral vascular disease, unspecified: Secondary | ICD-10-CM | POA: Diagnosis not present

## 2021-03-15 DIAGNOSIS — M86672 Other chronic osteomyelitis, left ankle and foot: Secondary | ICD-10-CM | POA: Diagnosis not present

## 2021-03-15 DIAGNOSIS — E1169 Type 2 diabetes mellitus with other specified complication: Secondary | ICD-10-CM | POA: Diagnosis not present

## 2021-03-15 DIAGNOSIS — I739 Peripheral vascular disease, unspecified: Secondary | ICD-10-CM | POA: Diagnosis not present

## 2021-03-16 DIAGNOSIS — I10 Essential (primary) hypertension: Secondary | ICD-10-CM | POA: Diagnosis not present

## 2021-03-19 DIAGNOSIS — K130 Diseases of lips: Secondary | ICD-10-CM | POA: Diagnosis not present

## 2021-04-16 DIAGNOSIS — I509 Heart failure, unspecified: Secondary | ICD-10-CM | POA: Diagnosis not present

## 2021-04-16 DIAGNOSIS — I152 Hypertension secondary to endocrine disorders: Secondary | ICD-10-CM | POA: Diagnosis not present

## 2021-04-16 DIAGNOSIS — E1159 Type 2 diabetes mellitus with other circulatory complications: Secondary | ICD-10-CM | POA: Diagnosis not present

## 2021-04-17 DIAGNOSIS — M6281 Muscle weakness (generalized): Secondary | ICD-10-CM | POA: Diagnosis not present

## 2021-04-17 DIAGNOSIS — G894 Chronic pain syndrome: Secondary | ICD-10-CM | POA: Diagnosis not present

## 2021-04-17 DIAGNOSIS — D649 Anemia, unspecified: Secondary | ICD-10-CM | POA: Diagnosis not present

## 2021-04-17 DIAGNOSIS — E119 Type 2 diabetes mellitus without complications: Secondary | ICD-10-CM | POA: Diagnosis not present

## 2021-04-17 DIAGNOSIS — I129 Hypertensive chronic kidney disease with stage 1 through stage 4 chronic kidney disease, or unspecified chronic kidney disease: Secondary | ICD-10-CM | POA: Diagnosis not present

## 2021-04-17 DIAGNOSIS — Z9181 History of falling: Secondary | ICD-10-CM | POA: Diagnosis not present

## 2021-04-17 DIAGNOSIS — Z89412 Acquired absence of left great toe: Secondary | ICD-10-CM | POA: Diagnosis not present

## 2021-04-17 DIAGNOSIS — D72829 Elevated white blood cell count, unspecified: Secondary | ICD-10-CM | POA: Diagnosis not present

## 2021-04-17 DIAGNOSIS — I10 Essential (primary) hypertension: Secondary | ICD-10-CM | POA: Diagnosis not present

## 2021-04-18 DIAGNOSIS — M6281 Muscle weakness (generalized): Secondary | ICD-10-CM | POA: Diagnosis not present

## 2021-04-18 DIAGNOSIS — Z89412 Acquired absence of left great toe: Secondary | ICD-10-CM | POA: Diagnosis not present

## 2021-04-18 DIAGNOSIS — Z9181 History of falling: Secondary | ICD-10-CM | POA: Diagnosis not present

## 2021-04-18 DIAGNOSIS — G894 Chronic pain syndrome: Secondary | ICD-10-CM | POA: Diagnosis not present

## 2021-04-18 DIAGNOSIS — I129 Hypertensive chronic kidney disease with stage 1 through stage 4 chronic kidney disease, or unspecified chronic kidney disease: Secondary | ICD-10-CM | POA: Diagnosis not present

## 2021-04-19 DIAGNOSIS — Z89412 Acquired absence of left great toe: Secondary | ICD-10-CM | POA: Diagnosis not present

## 2021-04-19 DIAGNOSIS — G894 Chronic pain syndrome: Secondary | ICD-10-CM | POA: Diagnosis not present

## 2021-04-19 DIAGNOSIS — Z9181 History of falling: Secondary | ICD-10-CM | POA: Diagnosis not present

## 2021-04-19 DIAGNOSIS — M6281 Muscle weakness (generalized): Secondary | ICD-10-CM | POA: Diagnosis not present

## 2021-04-19 DIAGNOSIS — I129 Hypertensive chronic kidney disease with stage 1 through stage 4 chronic kidney disease, or unspecified chronic kidney disease: Secondary | ICD-10-CM | POA: Diagnosis not present

## 2021-04-19 DIAGNOSIS — G4701 Insomnia due to medical condition: Secondary | ICD-10-CM | POA: Diagnosis not present

## 2021-04-20 DIAGNOSIS — Z9181 History of falling: Secondary | ICD-10-CM | POA: Diagnosis not present

## 2021-04-20 DIAGNOSIS — I129 Hypertensive chronic kidney disease with stage 1 through stage 4 chronic kidney disease, or unspecified chronic kidney disease: Secondary | ICD-10-CM | POA: Diagnosis not present

## 2021-04-20 DIAGNOSIS — M6281 Muscle weakness (generalized): Secondary | ICD-10-CM | POA: Diagnosis not present

## 2021-04-20 DIAGNOSIS — Z89412 Acquired absence of left great toe: Secondary | ICD-10-CM | POA: Diagnosis not present

## 2021-04-20 DIAGNOSIS — G894 Chronic pain syndrome: Secondary | ICD-10-CM | POA: Diagnosis not present

## 2021-04-21 DIAGNOSIS — G894 Chronic pain syndrome: Secondary | ICD-10-CM | POA: Diagnosis not present

## 2021-04-21 DIAGNOSIS — I129 Hypertensive chronic kidney disease with stage 1 through stage 4 chronic kidney disease, or unspecified chronic kidney disease: Secondary | ICD-10-CM | POA: Diagnosis not present

## 2021-04-21 DIAGNOSIS — Z89412 Acquired absence of left great toe: Secondary | ICD-10-CM | POA: Diagnosis not present

## 2021-04-21 DIAGNOSIS — M6281 Muscle weakness (generalized): Secondary | ICD-10-CM | POA: Diagnosis not present

## 2021-04-21 DIAGNOSIS — Z9181 History of falling: Secondary | ICD-10-CM | POA: Diagnosis not present

## 2021-04-24 DIAGNOSIS — Z9181 History of falling: Secondary | ICD-10-CM | POA: Diagnosis not present

## 2021-04-24 DIAGNOSIS — M6281 Muscle weakness (generalized): Secondary | ICD-10-CM | POA: Diagnosis not present

## 2021-04-24 DIAGNOSIS — Z89412 Acquired absence of left great toe: Secondary | ICD-10-CM | POA: Diagnosis not present

## 2021-04-24 DIAGNOSIS — G894 Chronic pain syndrome: Secondary | ICD-10-CM | POA: Diagnosis not present

## 2021-04-24 DIAGNOSIS — I129 Hypertensive chronic kidney disease with stage 1 through stage 4 chronic kidney disease, or unspecified chronic kidney disease: Secondary | ICD-10-CM | POA: Diagnosis not present

## 2021-04-25 DIAGNOSIS — Z9181 History of falling: Secondary | ICD-10-CM | POA: Diagnosis not present

## 2021-04-25 DIAGNOSIS — G894 Chronic pain syndrome: Secondary | ICD-10-CM | POA: Diagnosis not present

## 2021-04-25 DIAGNOSIS — I129 Hypertensive chronic kidney disease with stage 1 through stage 4 chronic kidney disease, or unspecified chronic kidney disease: Secondary | ICD-10-CM | POA: Diagnosis not present

## 2021-04-25 DIAGNOSIS — Z89412 Acquired absence of left great toe: Secondary | ICD-10-CM | POA: Diagnosis not present

## 2021-04-25 DIAGNOSIS — M6281 Muscle weakness (generalized): Secondary | ICD-10-CM | POA: Diagnosis not present

## 2021-04-26 DIAGNOSIS — G894 Chronic pain syndrome: Secondary | ICD-10-CM | POA: Diagnosis not present

## 2021-04-26 DIAGNOSIS — Z9181 History of falling: Secondary | ICD-10-CM | POA: Diagnosis not present

## 2021-04-26 DIAGNOSIS — I129 Hypertensive chronic kidney disease with stage 1 through stage 4 chronic kidney disease, or unspecified chronic kidney disease: Secondary | ICD-10-CM | POA: Diagnosis not present

## 2021-04-26 DIAGNOSIS — M6281 Muscle weakness (generalized): Secondary | ICD-10-CM | POA: Diagnosis not present

## 2021-04-26 DIAGNOSIS — Z89412 Acquired absence of left great toe: Secondary | ICD-10-CM | POA: Diagnosis not present

## 2021-04-27 DIAGNOSIS — M6281 Muscle weakness (generalized): Secondary | ICD-10-CM | POA: Diagnosis not present

## 2021-04-27 DIAGNOSIS — I129 Hypertensive chronic kidney disease with stage 1 through stage 4 chronic kidney disease, or unspecified chronic kidney disease: Secondary | ICD-10-CM | POA: Diagnosis not present

## 2021-04-27 DIAGNOSIS — G894 Chronic pain syndrome: Secondary | ICD-10-CM | POA: Diagnosis not present

## 2021-04-27 DIAGNOSIS — Z9181 History of falling: Secondary | ICD-10-CM | POA: Diagnosis not present

## 2021-04-27 DIAGNOSIS — Z89412 Acquired absence of left great toe: Secondary | ICD-10-CM | POA: Diagnosis not present

## 2021-04-30 DIAGNOSIS — Z9181 History of falling: Secondary | ICD-10-CM | POA: Diagnosis not present

## 2021-04-30 DIAGNOSIS — G894 Chronic pain syndrome: Secondary | ICD-10-CM | POA: Diagnosis not present

## 2021-04-30 DIAGNOSIS — I129 Hypertensive chronic kidney disease with stage 1 through stage 4 chronic kidney disease, or unspecified chronic kidney disease: Secondary | ICD-10-CM | POA: Diagnosis not present

## 2021-04-30 DIAGNOSIS — M6281 Muscle weakness (generalized): Secondary | ICD-10-CM | POA: Diagnosis not present

## 2021-04-30 DIAGNOSIS — Z89412 Acquired absence of left great toe: Secondary | ICD-10-CM | POA: Diagnosis not present

## 2021-05-08 DIAGNOSIS — Z961 Presence of intraocular lens: Secondary | ICD-10-CM | POA: Diagnosis not present

## 2021-05-08 DIAGNOSIS — E119 Type 2 diabetes mellitus without complications: Secondary | ICD-10-CM | POA: Diagnosis not present

## 2021-05-08 DIAGNOSIS — H524 Presbyopia: Secondary | ICD-10-CM | POA: Diagnosis not present

## 2021-05-10 DIAGNOSIS — G4701 Insomnia due to medical condition: Secondary | ICD-10-CM | POA: Diagnosis not present

## 2021-05-15 DIAGNOSIS — E039 Hypothyroidism, unspecified: Secondary | ICD-10-CM | POA: Diagnosis not present

## 2021-05-17 DIAGNOSIS — E785 Hyperlipidemia, unspecified: Secondary | ICD-10-CM | POA: Diagnosis not present

## 2021-05-17 DIAGNOSIS — J449 Chronic obstructive pulmonary disease, unspecified: Secondary | ICD-10-CM | POA: Diagnosis not present

## 2021-05-17 DIAGNOSIS — E1169 Type 2 diabetes mellitus with other specified complication: Secondary | ICD-10-CM | POA: Diagnosis not present

## 2021-06-07 DIAGNOSIS — G4701 Insomnia due to medical condition: Secondary | ICD-10-CM | POA: Diagnosis not present

## 2021-06-09 IMAGING — CT CT HEAD W/O CM
4 series · 15 of 47 positions shown, 17 images · non-contrast
Comparison: 02/01/2012

CLINICAL DATA: Encephalopathy

EXAM:
CT HEAD WITHOUT CONTRAST
TECHNIQUE: Contiguous axial images were obtained from the base of the skull
through the vertex without intravenous contrast.

[Series 3: head bone · axial · 0.48mm/px · z∈[-253,-237]mm · 2 of 83 slices shown]
[im 9/83  bone]
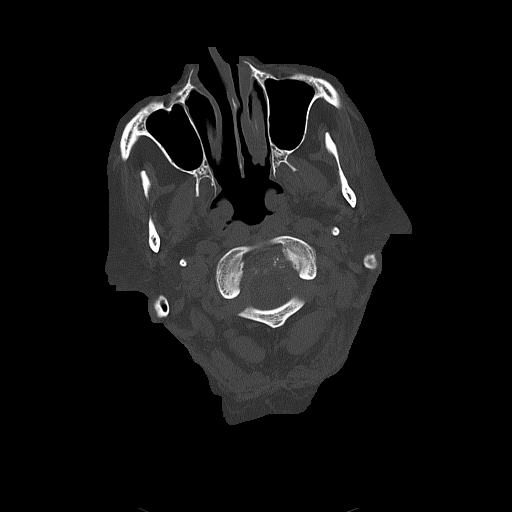
[im 17/83  bone]
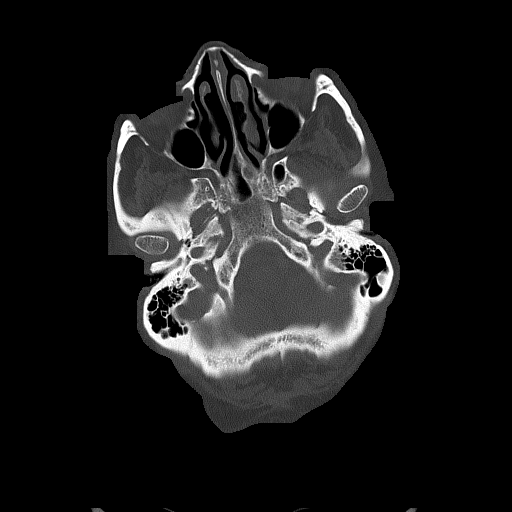

[Series 4: head without · axial · non-contrast · 0.48mm/px · z∈[-249,-129]mm · 7 of 34 slices shown, 9 images]
[im 5/34  brain]
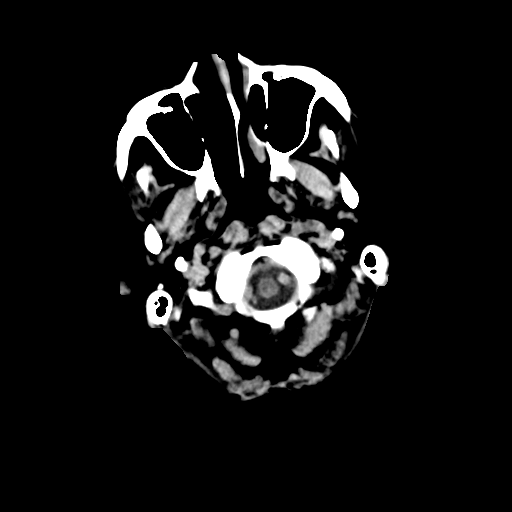
[im 5/34  bone]
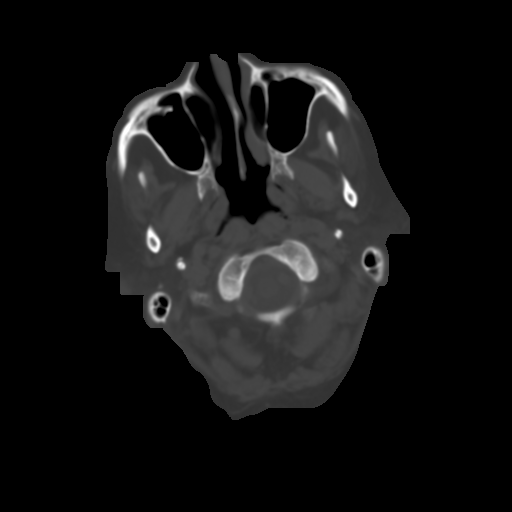
[im 9/34  brain]
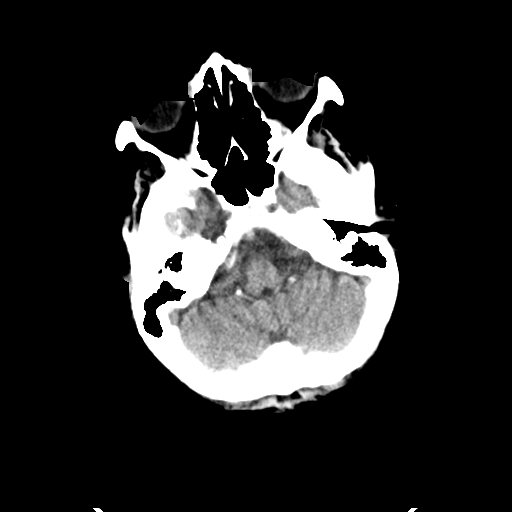
[im 13/34  brain]
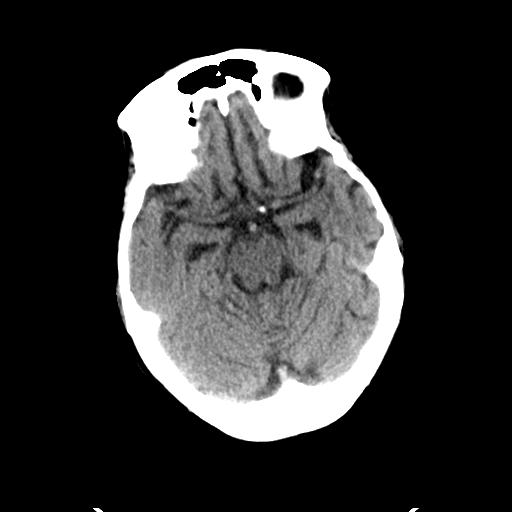
[im 17/34  brain]
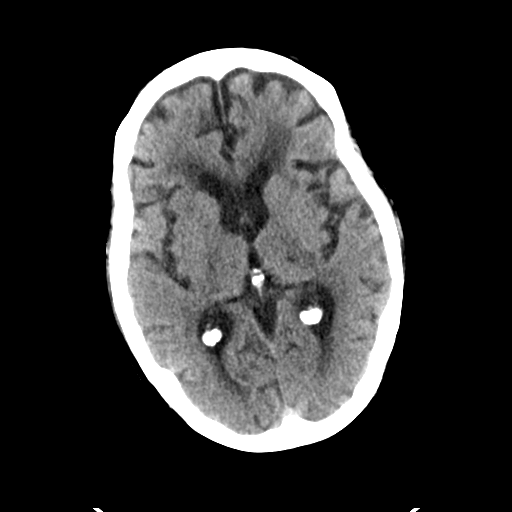
[im 21/34  brain]
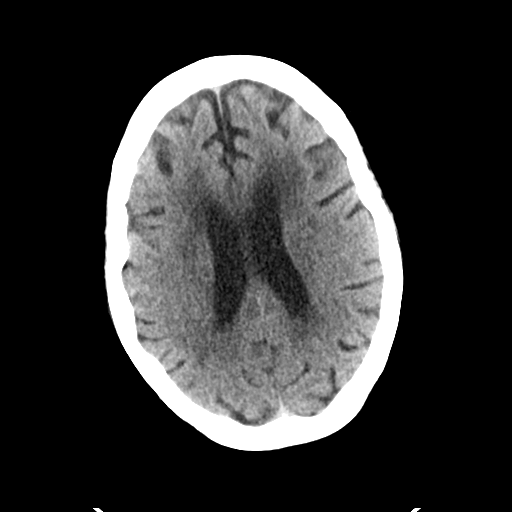
[im 21/34  bone]
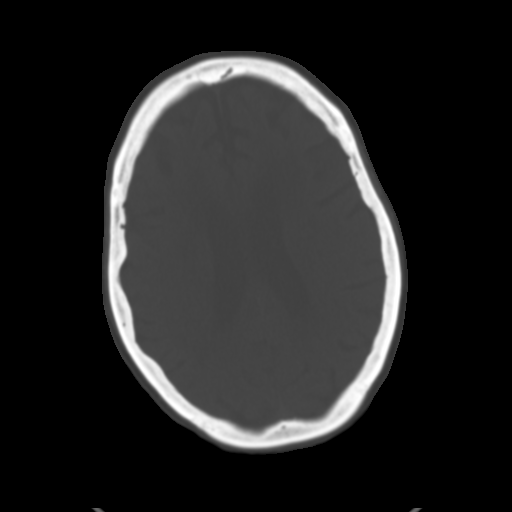
[im 25/34  brain]
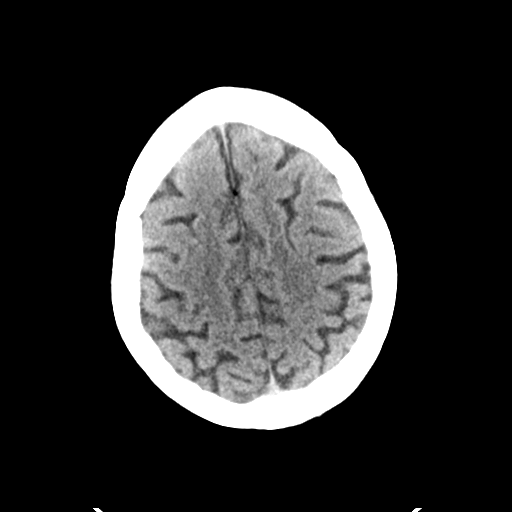
[im 29/34  brain]
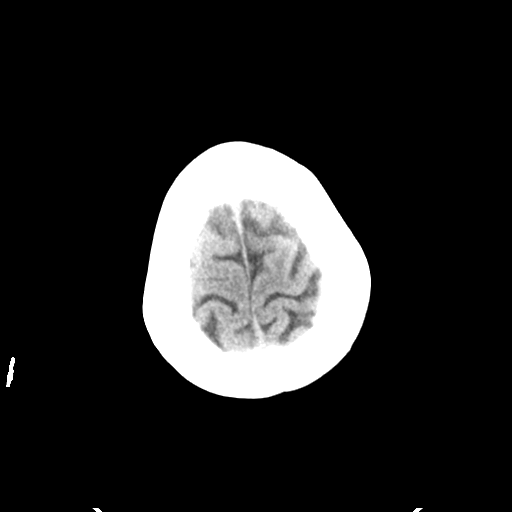

[Series 5: head without cor · coronal · non-contrast · 0.32mm/px · 3 of 71 slices shown]
[im 24/71  brain]
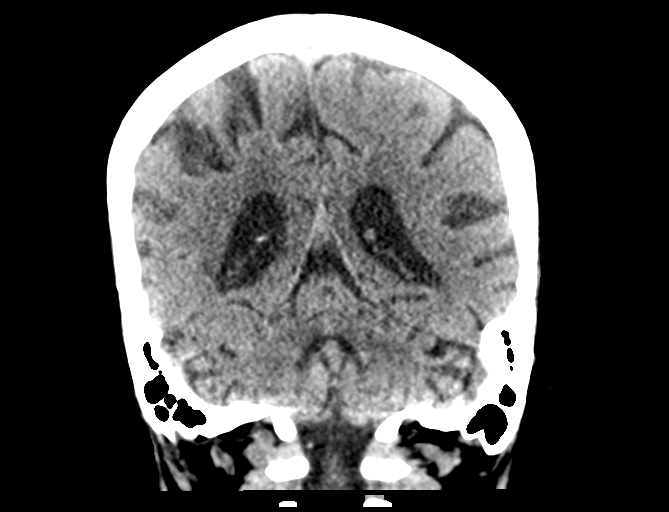
[im 32/71  brain]
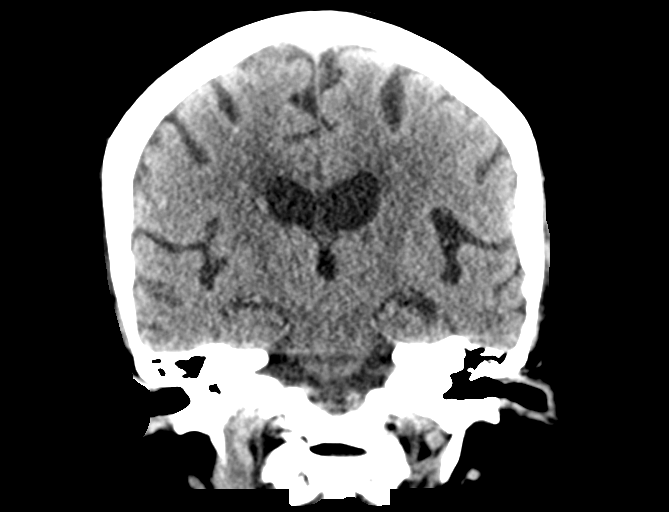
[im 39/71  brain]
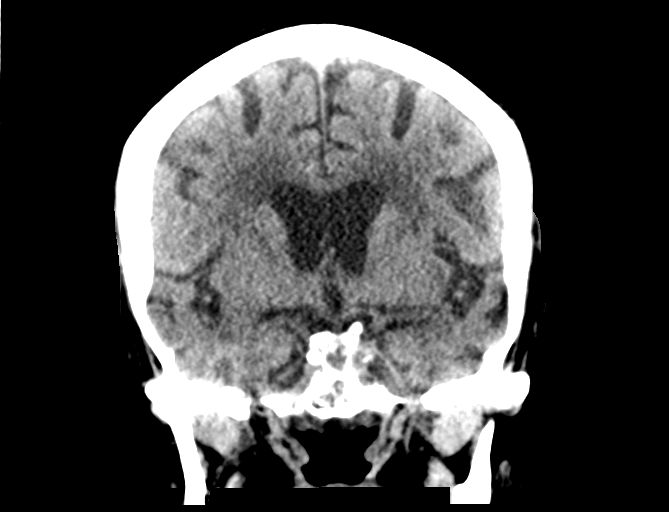

[Series 6: head without sag · sagittal · non-contrast · 0.32mm/px · 3 of 65 slices shown]
[im 22/65  brain]
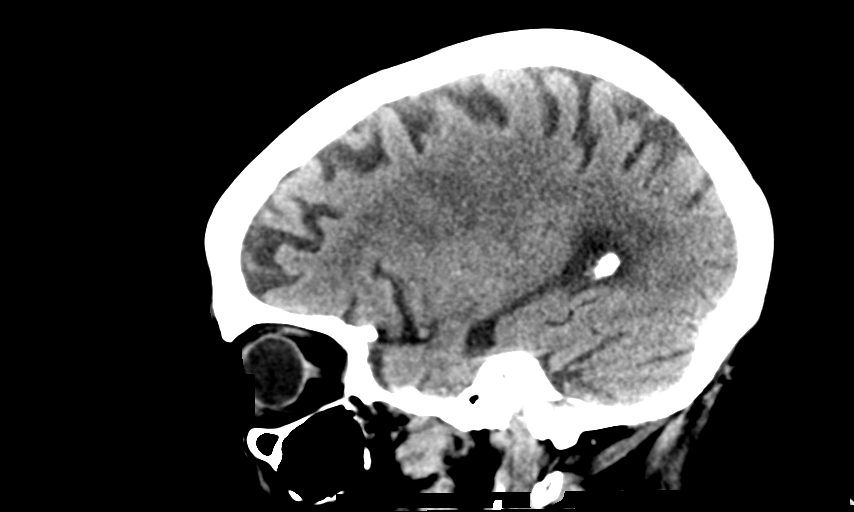
[im 33/65  brain]
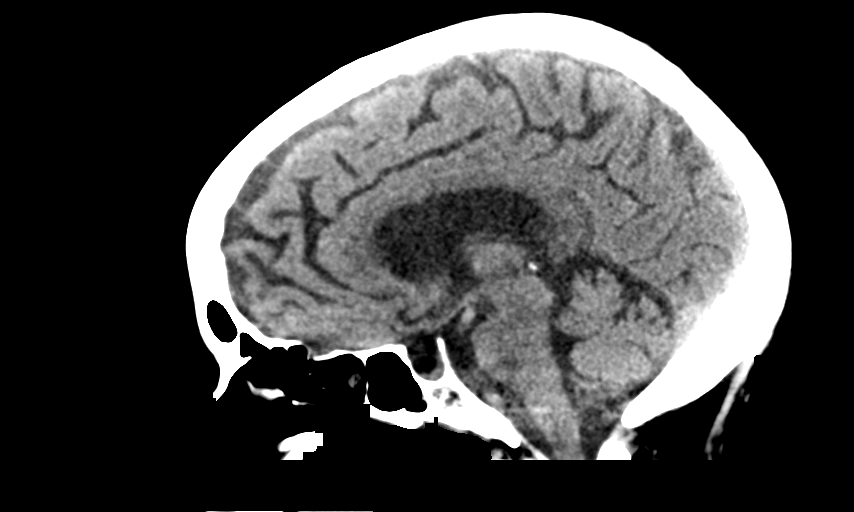
[im 43/65  brain]
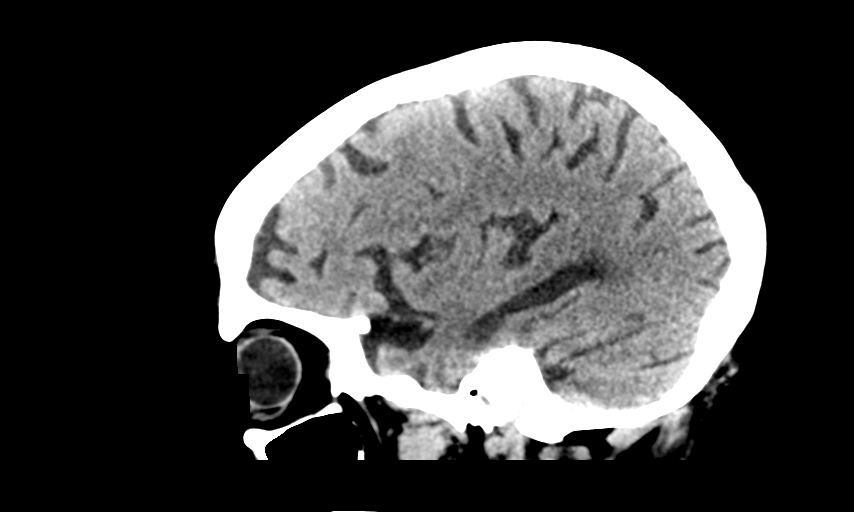

[15 of 47 positions shown; findings below may reference images not displayed]

FINDINGS: Brain: There is no mass, hemorrhage or extra-axial collection. The
size and configuration of the ventricles and extra-axial CSF spaces
are normal. There is hypoattenuation of the white matter, most
commonly indicating chronic small vessel disease.

Vascular: No abnormal hyperdensity of the major intracranial
arteries or dural venous sinuses. No intracranial atherosclerosis.

Skull: The visualized skull base, calvarium and extracranial soft
tissues are normal.

Sinuses/Orbits: No fluid levels or advanced mucosal thickening of
the visualized paranasal sinuses. No mastoid or middle ear effusion.
The orbits are normal.
IMPRESSION: Chronic small vessel disease without acute intracranial abnormality.

## 2021-06-10 IMAGING — DX DG ABDOMEN 1V
2 series · 2 of 2 positions shown · non-contrast
Comparison: 12/02/2009

CLINICAL DATA: Nausea and vomiting.  Fatigue.

EXAM:
ABDOMEN - 1 VIEW

[abdomen kub (1 of 2)]
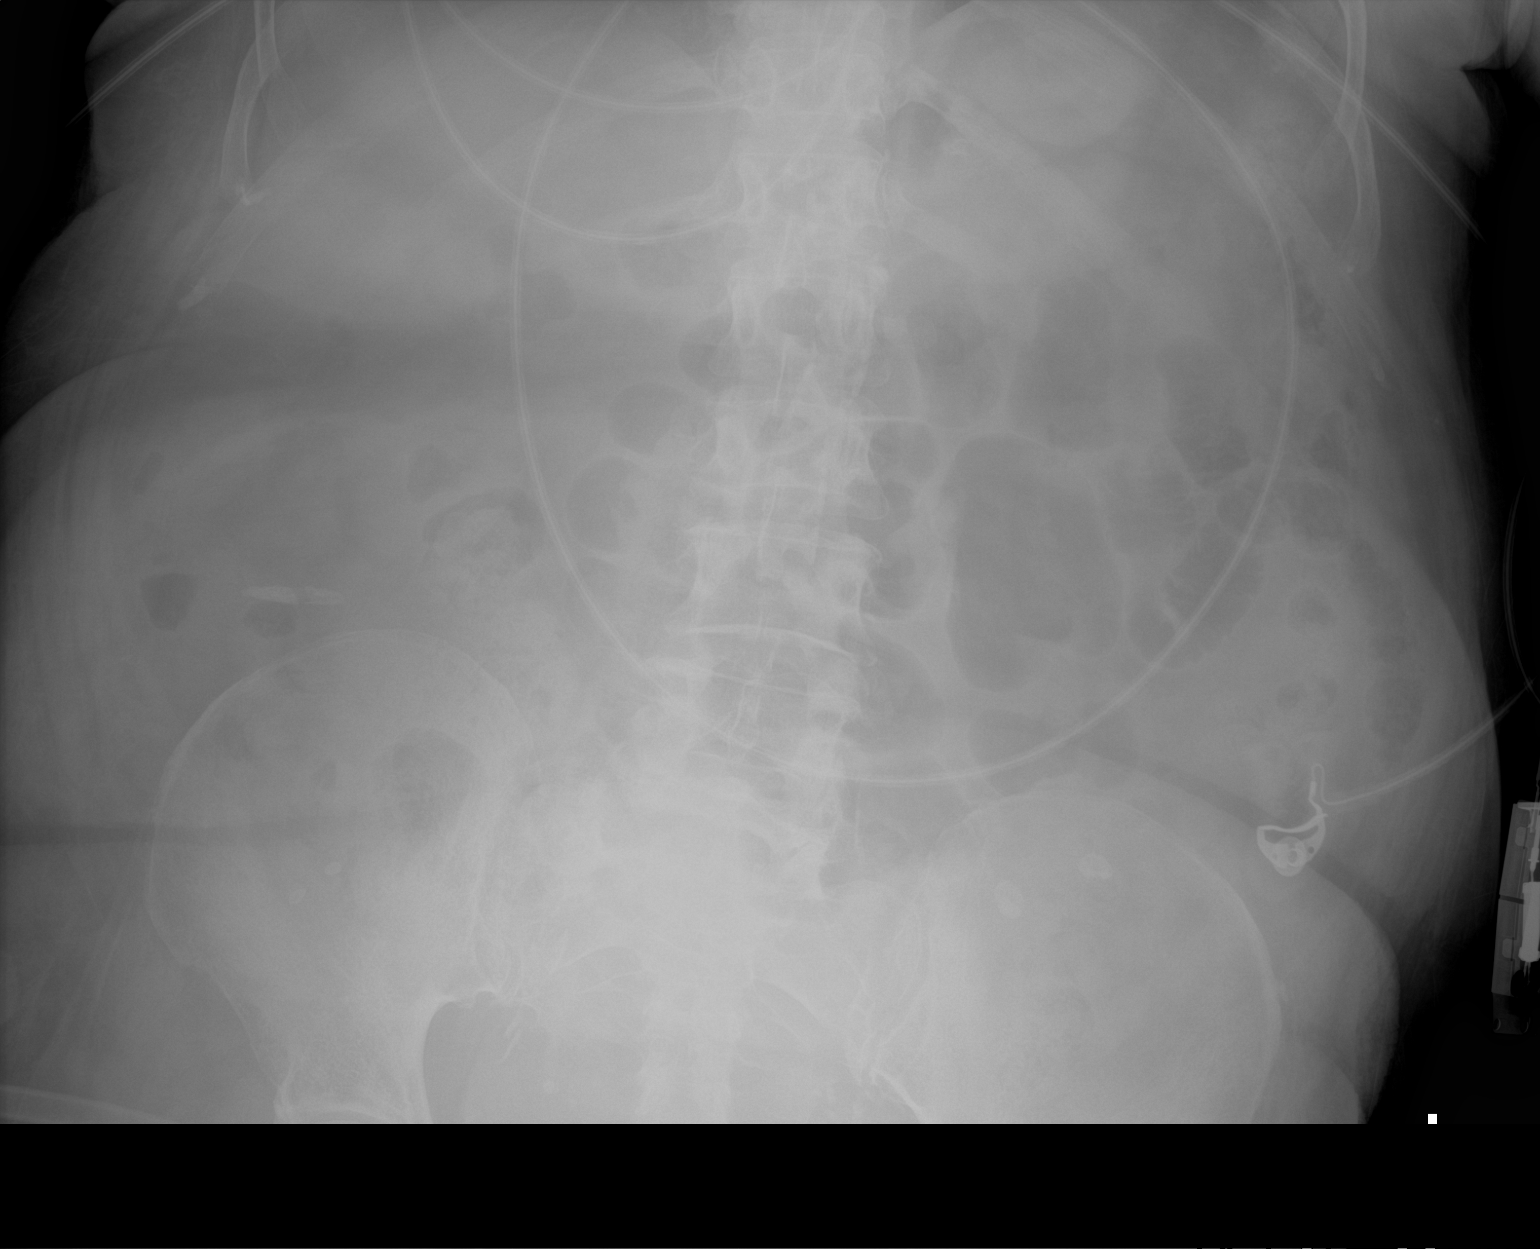

[abdomen kub (2 of 2)]
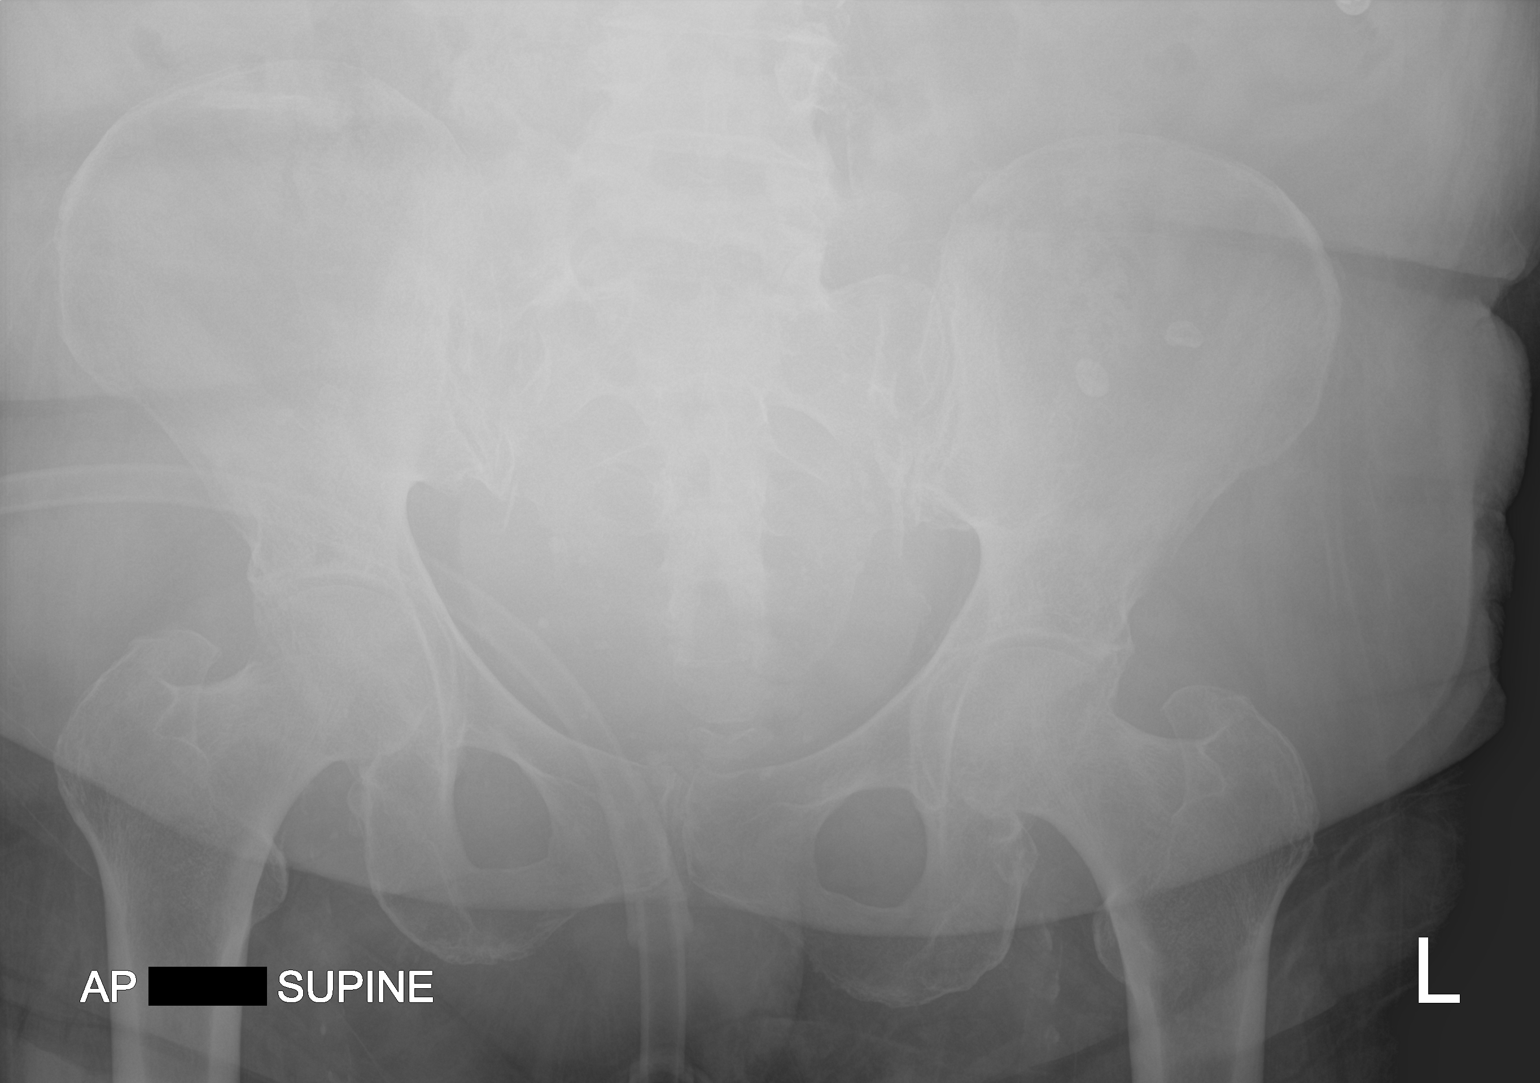

[2 of 2 positions shown; findings below may reference images not displayed]

FINDINGS: Two supine views. No gaseous distension of bowel loops. Mild
degradation secondary to patient size. No gross free intraperitoneal
air. Hyperattenuation projecting cephalad to the right iliac crest
is nonspecific and could be external to the patient.
IMPRESSION: No acute findings.

## 2021-06-20 DIAGNOSIS — E785 Hyperlipidemia, unspecified: Secondary | ICD-10-CM | POA: Diagnosis not present

## 2021-06-20 DIAGNOSIS — E1169 Type 2 diabetes mellitus with other specified complication: Secondary | ICD-10-CM | POA: Diagnosis not present

## 2021-06-20 DIAGNOSIS — I509 Heart failure, unspecified: Secondary | ICD-10-CM | POA: Diagnosis not present

## 2021-07-05 DIAGNOSIS — Z1389 Encounter for screening for other disorder: Secondary | ICD-10-CM | POA: Diagnosis not present

## 2021-07-05 DIAGNOSIS — Z139 Encounter for screening, unspecified: Secondary | ICD-10-CM | POA: Diagnosis not present

## 2021-07-05 DIAGNOSIS — Z0000 Encounter for general adult medical examination without abnormal findings: Secondary | ICD-10-CM | POA: Diagnosis not present

## 2021-07-12 DIAGNOSIS — G4701 Insomnia due to medical condition: Secondary | ICD-10-CM | POA: Diagnosis not present

## 2021-07-27 DIAGNOSIS — R2681 Unsteadiness on feet: Secondary | ICD-10-CM | POA: Diagnosis not present

## 2021-07-27 DIAGNOSIS — R2689 Other abnormalities of gait and mobility: Secondary | ICD-10-CM | POA: Diagnosis not present

## 2021-07-27 DIAGNOSIS — M6281 Muscle weakness (generalized): Secondary | ICD-10-CM | POA: Diagnosis not present

## 2021-07-27 DIAGNOSIS — J449 Chronic obstructive pulmonary disease, unspecified: Secondary | ICD-10-CM | POA: Diagnosis not present

## 2021-07-31 DIAGNOSIS — R2681 Unsteadiness on feet: Secondary | ICD-10-CM | POA: Diagnosis not present

## 2021-07-31 DIAGNOSIS — M6281 Muscle weakness (generalized): Secondary | ICD-10-CM | POA: Diagnosis not present

## 2021-07-31 DIAGNOSIS — R2689 Other abnormalities of gait and mobility: Secondary | ICD-10-CM | POA: Diagnosis not present

## 2021-07-31 DIAGNOSIS — J449 Chronic obstructive pulmonary disease, unspecified: Secondary | ICD-10-CM | POA: Diagnosis not present

## 2021-08-01 DIAGNOSIS — M6281 Muscle weakness (generalized): Secondary | ICD-10-CM | POA: Diagnosis not present

## 2021-08-01 DIAGNOSIS — R2681 Unsteadiness on feet: Secondary | ICD-10-CM | POA: Diagnosis not present

## 2021-08-01 DIAGNOSIS — R2689 Other abnormalities of gait and mobility: Secondary | ICD-10-CM | POA: Diagnosis not present

## 2021-08-01 DIAGNOSIS — J449 Chronic obstructive pulmonary disease, unspecified: Secondary | ICD-10-CM | POA: Diagnosis not present

## 2021-08-02 DIAGNOSIS — R2689 Other abnormalities of gait and mobility: Secondary | ICD-10-CM | POA: Diagnosis not present

## 2021-08-02 DIAGNOSIS — E785 Hyperlipidemia, unspecified: Secondary | ICD-10-CM | POA: Diagnosis not present

## 2021-08-02 DIAGNOSIS — J449 Chronic obstructive pulmonary disease, unspecified: Secondary | ICD-10-CM | POA: Diagnosis not present

## 2021-08-02 DIAGNOSIS — M6281 Muscle weakness (generalized): Secondary | ICD-10-CM | POA: Diagnosis not present

## 2021-08-02 DIAGNOSIS — E1169 Type 2 diabetes mellitus with other specified complication: Secondary | ICD-10-CM | POA: Diagnosis not present

## 2021-08-02 DIAGNOSIS — I509 Heart failure, unspecified: Secondary | ICD-10-CM | POA: Diagnosis not present

## 2021-08-02 DIAGNOSIS — R2681 Unsteadiness on feet: Secondary | ICD-10-CM | POA: Diagnosis not present

## 2021-08-03 DIAGNOSIS — R2681 Unsteadiness on feet: Secondary | ICD-10-CM | POA: Diagnosis not present

## 2021-08-03 DIAGNOSIS — J449 Chronic obstructive pulmonary disease, unspecified: Secondary | ICD-10-CM | POA: Diagnosis not present

## 2021-08-03 DIAGNOSIS — M6281 Muscle weakness (generalized): Secondary | ICD-10-CM | POA: Diagnosis not present

## 2021-08-03 DIAGNOSIS — R2689 Other abnormalities of gait and mobility: Secondary | ICD-10-CM | POA: Diagnosis not present

## 2021-08-06 DIAGNOSIS — M6281 Muscle weakness (generalized): Secondary | ICD-10-CM | POA: Diagnosis not present

## 2021-08-06 DIAGNOSIS — R2681 Unsteadiness on feet: Secondary | ICD-10-CM | POA: Diagnosis not present

## 2021-08-06 DIAGNOSIS — R2689 Other abnormalities of gait and mobility: Secondary | ICD-10-CM | POA: Diagnosis not present

## 2021-08-06 DIAGNOSIS — J449 Chronic obstructive pulmonary disease, unspecified: Secondary | ICD-10-CM | POA: Diagnosis not present

## 2021-08-07 DIAGNOSIS — M6281 Muscle weakness (generalized): Secondary | ICD-10-CM | POA: Diagnosis not present

## 2021-08-07 DIAGNOSIS — R2681 Unsteadiness on feet: Secondary | ICD-10-CM | POA: Diagnosis not present

## 2021-08-07 DIAGNOSIS — R2689 Other abnormalities of gait and mobility: Secondary | ICD-10-CM | POA: Diagnosis not present

## 2021-08-07 DIAGNOSIS — J449 Chronic obstructive pulmonary disease, unspecified: Secondary | ICD-10-CM | POA: Diagnosis not present

## 2021-08-08 DIAGNOSIS — R2689 Other abnormalities of gait and mobility: Secondary | ICD-10-CM | POA: Diagnosis not present

## 2021-08-08 DIAGNOSIS — R2681 Unsteadiness on feet: Secondary | ICD-10-CM | POA: Diagnosis not present

## 2021-08-08 DIAGNOSIS — J449 Chronic obstructive pulmonary disease, unspecified: Secondary | ICD-10-CM | POA: Diagnosis not present

## 2021-08-08 DIAGNOSIS — M6281 Muscle weakness (generalized): Secondary | ICD-10-CM | POA: Diagnosis not present

## 2021-08-09 DIAGNOSIS — J449 Chronic obstructive pulmonary disease, unspecified: Secondary | ICD-10-CM | POA: Diagnosis not present

## 2021-08-09 DIAGNOSIS — M6281 Muscle weakness (generalized): Secondary | ICD-10-CM | POA: Diagnosis not present

## 2021-08-09 DIAGNOSIS — R2689 Other abnormalities of gait and mobility: Secondary | ICD-10-CM | POA: Diagnosis not present

## 2021-08-09 DIAGNOSIS — R2681 Unsteadiness on feet: Secondary | ICD-10-CM | POA: Diagnosis not present

## 2021-08-10 DIAGNOSIS — R2689 Other abnormalities of gait and mobility: Secondary | ICD-10-CM | POA: Diagnosis not present

## 2021-08-10 DIAGNOSIS — R2681 Unsteadiness on feet: Secondary | ICD-10-CM | POA: Diagnosis not present

## 2021-08-10 DIAGNOSIS — J449 Chronic obstructive pulmonary disease, unspecified: Secondary | ICD-10-CM | POA: Diagnosis not present

## 2021-08-10 DIAGNOSIS — M6281 Muscle weakness (generalized): Secondary | ICD-10-CM | POA: Diagnosis not present

## 2021-08-13 DIAGNOSIS — M6281 Muscle weakness (generalized): Secondary | ICD-10-CM | POA: Diagnosis not present

## 2021-08-13 DIAGNOSIS — R2681 Unsteadiness on feet: Secondary | ICD-10-CM | POA: Diagnosis not present

## 2021-08-13 DIAGNOSIS — R2689 Other abnormalities of gait and mobility: Secondary | ICD-10-CM | POA: Diagnosis not present

## 2021-08-13 DIAGNOSIS — J449 Chronic obstructive pulmonary disease, unspecified: Secondary | ICD-10-CM | POA: Diagnosis not present

## 2021-08-14 DIAGNOSIS — R2681 Unsteadiness on feet: Secondary | ICD-10-CM | POA: Diagnosis not present

## 2021-08-14 DIAGNOSIS — J449 Chronic obstructive pulmonary disease, unspecified: Secondary | ICD-10-CM | POA: Diagnosis not present

## 2021-08-14 DIAGNOSIS — M6281 Muscle weakness (generalized): Secondary | ICD-10-CM | POA: Diagnosis not present

## 2021-08-14 DIAGNOSIS — R2689 Other abnormalities of gait and mobility: Secondary | ICD-10-CM | POA: Diagnosis not present

## 2021-08-15 DIAGNOSIS — M6281 Muscle weakness (generalized): Secondary | ICD-10-CM | POA: Diagnosis not present

## 2021-08-15 DIAGNOSIS — R2689 Other abnormalities of gait and mobility: Secondary | ICD-10-CM | POA: Diagnosis not present

## 2021-08-15 DIAGNOSIS — R2681 Unsteadiness on feet: Secondary | ICD-10-CM | POA: Diagnosis not present

## 2021-08-15 DIAGNOSIS — J449 Chronic obstructive pulmonary disease, unspecified: Secondary | ICD-10-CM | POA: Diagnosis not present

## 2021-08-16 DIAGNOSIS — M6281 Muscle weakness (generalized): Secondary | ICD-10-CM | POA: Diagnosis not present

## 2021-08-16 DIAGNOSIS — R2681 Unsteadiness on feet: Secondary | ICD-10-CM | POA: Diagnosis not present

## 2021-08-16 DIAGNOSIS — J449 Chronic obstructive pulmonary disease, unspecified: Secondary | ICD-10-CM | POA: Diagnosis not present

## 2021-08-16 DIAGNOSIS — R2689 Other abnormalities of gait and mobility: Secondary | ICD-10-CM | POA: Diagnosis not present

## 2021-08-17 DIAGNOSIS — R2681 Unsteadiness on feet: Secondary | ICD-10-CM | POA: Diagnosis not present

## 2021-08-17 DIAGNOSIS — R2689 Other abnormalities of gait and mobility: Secondary | ICD-10-CM | POA: Diagnosis not present

## 2021-08-17 DIAGNOSIS — M6281 Muscle weakness (generalized): Secondary | ICD-10-CM | POA: Diagnosis not present

## 2021-08-17 DIAGNOSIS — J449 Chronic obstructive pulmonary disease, unspecified: Secondary | ICD-10-CM | POA: Diagnosis not present

## 2021-08-20 DIAGNOSIS — J449 Chronic obstructive pulmonary disease, unspecified: Secondary | ICD-10-CM | POA: Diagnosis not present

## 2021-08-20 DIAGNOSIS — M6281 Muscle weakness (generalized): Secondary | ICD-10-CM | POA: Diagnosis not present

## 2021-08-20 DIAGNOSIS — R2689 Other abnormalities of gait and mobility: Secondary | ICD-10-CM | POA: Diagnosis not present

## 2021-08-20 DIAGNOSIS — R2681 Unsteadiness on feet: Secondary | ICD-10-CM | POA: Diagnosis not present

## 2021-08-21 DIAGNOSIS — R2681 Unsteadiness on feet: Secondary | ICD-10-CM | POA: Diagnosis not present

## 2021-08-21 DIAGNOSIS — J449 Chronic obstructive pulmonary disease, unspecified: Secondary | ICD-10-CM | POA: Diagnosis not present

## 2021-08-21 DIAGNOSIS — R2689 Other abnormalities of gait and mobility: Secondary | ICD-10-CM | POA: Diagnosis not present

## 2021-08-21 DIAGNOSIS — M6281 Muscle weakness (generalized): Secondary | ICD-10-CM | POA: Diagnosis not present

## 2021-08-22 DIAGNOSIS — J449 Chronic obstructive pulmonary disease, unspecified: Secondary | ICD-10-CM | POA: Diagnosis not present

## 2021-08-22 DIAGNOSIS — M6281 Muscle weakness (generalized): Secondary | ICD-10-CM | POA: Diagnosis not present

## 2021-08-22 DIAGNOSIS — R2689 Other abnormalities of gait and mobility: Secondary | ICD-10-CM | POA: Diagnosis not present

## 2021-08-22 DIAGNOSIS — R2681 Unsteadiness on feet: Secondary | ICD-10-CM | POA: Diagnosis not present

## 2021-08-23 DIAGNOSIS — R2689 Other abnormalities of gait and mobility: Secondary | ICD-10-CM | POA: Diagnosis not present

## 2021-08-23 DIAGNOSIS — M6281 Muscle weakness (generalized): Secondary | ICD-10-CM | POA: Diagnosis not present

## 2021-08-23 DIAGNOSIS — R2681 Unsteadiness on feet: Secondary | ICD-10-CM | POA: Diagnosis not present

## 2021-08-23 DIAGNOSIS — J449 Chronic obstructive pulmonary disease, unspecified: Secondary | ICD-10-CM | POA: Diagnosis not present

## 2021-08-24 DIAGNOSIS — M6281 Muscle weakness (generalized): Secondary | ICD-10-CM | POA: Diagnosis not present

## 2021-08-24 DIAGNOSIS — R2681 Unsteadiness on feet: Secondary | ICD-10-CM | POA: Diagnosis not present

## 2021-08-24 DIAGNOSIS — R2689 Other abnormalities of gait and mobility: Secondary | ICD-10-CM | POA: Diagnosis not present

## 2021-08-24 DIAGNOSIS — J449 Chronic obstructive pulmonary disease, unspecified: Secondary | ICD-10-CM | POA: Diagnosis not present

## 2021-08-27 DIAGNOSIS — M6281 Muscle weakness (generalized): Secondary | ICD-10-CM | POA: Diagnosis not present

## 2021-08-27 DIAGNOSIS — R2681 Unsteadiness on feet: Secondary | ICD-10-CM | POA: Diagnosis not present

## 2021-08-27 DIAGNOSIS — J449 Chronic obstructive pulmonary disease, unspecified: Secondary | ICD-10-CM | POA: Diagnosis not present

## 2021-08-27 DIAGNOSIS — R2689 Other abnormalities of gait and mobility: Secondary | ICD-10-CM | POA: Diagnosis not present

## 2021-08-28 DIAGNOSIS — M6281 Muscle weakness (generalized): Secondary | ICD-10-CM | POA: Diagnosis not present

## 2021-08-28 DIAGNOSIS — R2681 Unsteadiness on feet: Secondary | ICD-10-CM | POA: Diagnosis not present

## 2021-08-28 DIAGNOSIS — R2689 Other abnormalities of gait and mobility: Secondary | ICD-10-CM | POA: Diagnosis not present

## 2021-08-28 DIAGNOSIS — J449 Chronic obstructive pulmonary disease, unspecified: Secondary | ICD-10-CM | POA: Diagnosis not present

## 2021-08-29 DIAGNOSIS — M6281 Muscle weakness (generalized): Secondary | ICD-10-CM | POA: Diagnosis not present

## 2021-08-29 DIAGNOSIS — R2689 Other abnormalities of gait and mobility: Secondary | ICD-10-CM | POA: Diagnosis not present

## 2021-08-29 DIAGNOSIS — R2681 Unsteadiness on feet: Secondary | ICD-10-CM | POA: Diagnosis not present

## 2021-08-29 DIAGNOSIS — J449 Chronic obstructive pulmonary disease, unspecified: Secondary | ICD-10-CM | POA: Diagnosis not present

## 2021-08-30 DIAGNOSIS — J449 Chronic obstructive pulmonary disease, unspecified: Secondary | ICD-10-CM | POA: Diagnosis not present

## 2021-08-30 DIAGNOSIS — R2681 Unsteadiness on feet: Secondary | ICD-10-CM | POA: Diagnosis not present

## 2021-08-30 DIAGNOSIS — M6281 Muscle weakness (generalized): Secondary | ICD-10-CM | POA: Diagnosis not present

## 2021-08-30 DIAGNOSIS — R2689 Other abnormalities of gait and mobility: Secondary | ICD-10-CM | POA: Diagnosis not present

## 2021-08-31 DIAGNOSIS — R2689 Other abnormalities of gait and mobility: Secondary | ICD-10-CM | POA: Diagnosis not present

## 2021-08-31 DIAGNOSIS — M6281 Muscle weakness (generalized): Secondary | ICD-10-CM | POA: Diagnosis not present

## 2021-08-31 DIAGNOSIS — J449 Chronic obstructive pulmonary disease, unspecified: Secondary | ICD-10-CM | POA: Diagnosis not present

## 2021-08-31 DIAGNOSIS — R2681 Unsteadiness on feet: Secondary | ICD-10-CM | POA: Diagnosis not present

## 2021-09-03 DIAGNOSIS — R2689 Other abnormalities of gait and mobility: Secondary | ICD-10-CM | POA: Diagnosis not present

## 2021-09-03 DIAGNOSIS — R2681 Unsteadiness on feet: Secondary | ICD-10-CM | POA: Diagnosis not present

## 2021-09-03 DIAGNOSIS — J449 Chronic obstructive pulmonary disease, unspecified: Secondary | ICD-10-CM | POA: Diagnosis not present

## 2021-09-03 DIAGNOSIS — M6281 Muscle weakness (generalized): Secondary | ICD-10-CM | POA: Diagnosis not present

## 2021-09-04 DIAGNOSIS — R2689 Other abnormalities of gait and mobility: Secondary | ICD-10-CM | POA: Diagnosis not present

## 2021-09-04 DIAGNOSIS — M6281 Muscle weakness (generalized): Secondary | ICD-10-CM | POA: Diagnosis not present

## 2021-09-04 DIAGNOSIS — E1159 Type 2 diabetes mellitus with other circulatory complications: Secondary | ICD-10-CM | POA: Diagnosis not present

## 2021-09-04 DIAGNOSIS — E1169 Type 2 diabetes mellitus with other specified complication: Secondary | ICD-10-CM | POA: Diagnosis not present

## 2021-09-04 DIAGNOSIS — J449 Chronic obstructive pulmonary disease, unspecified: Secondary | ICD-10-CM | POA: Diagnosis not present

## 2021-09-04 DIAGNOSIS — R2681 Unsteadiness on feet: Secondary | ICD-10-CM | POA: Diagnosis not present

## 2021-09-04 DIAGNOSIS — E785 Hyperlipidemia, unspecified: Secondary | ICD-10-CM | POA: Diagnosis not present

## 2021-09-05 DIAGNOSIS — R2689 Other abnormalities of gait and mobility: Secondary | ICD-10-CM | POA: Diagnosis not present

## 2021-09-05 DIAGNOSIS — J449 Chronic obstructive pulmonary disease, unspecified: Secondary | ICD-10-CM | POA: Diagnosis not present

## 2021-09-05 DIAGNOSIS — E1140 Type 2 diabetes mellitus with diabetic neuropathy, unspecified: Secondary | ICD-10-CM | POA: Diagnosis not present

## 2021-09-05 DIAGNOSIS — M6281 Muscle weakness (generalized): Secondary | ICD-10-CM | POA: Diagnosis not present

## 2021-09-05 DIAGNOSIS — I10 Essential (primary) hypertension: Secondary | ICD-10-CM | POA: Diagnosis not present

## 2021-09-05 DIAGNOSIS — R2681 Unsteadiness on feet: Secondary | ICD-10-CM | POA: Diagnosis not present

## 2021-09-06 DIAGNOSIS — R2689 Other abnormalities of gait and mobility: Secondary | ICD-10-CM | POA: Diagnosis not present

## 2021-09-06 DIAGNOSIS — R2681 Unsteadiness on feet: Secondary | ICD-10-CM | POA: Diagnosis not present

## 2021-09-06 DIAGNOSIS — J449 Chronic obstructive pulmonary disease, unspecified: Secondary | ICD-10-CM | POA: Diagnosis not present

## 2021-09-06 DIAGNOSIS — M6281 Muscle weakness (generalized): Secondary | ICD-10-CM | POA: Diagnosis not present

## 2021-09-06 DIAGNOSIS — G4701 Insomnia due to medical condition: Secondary | ICD-10-CM | POA: Diagnosis not present

## 2021-09-07 DIAGNOSIS — I10 Essential (primary) hypertension: Secondary | ICD-10-CM | POA: Diagnosis not present

## 2021-09-07 DIAGNOSIS — R2681 Unsteadiness on feet: Secondary | ICD-10-CM | POA: Diagnosis not present

## 2021-09-07 DIAGNOSIS — R2689 Other abnormalities of gait and mobility: Secondary | ICD-10-CM | POA: Diagnosis not present

## 2021-09-07 DIAGNOSIS — J449 Chronic obstructive pulmonary disease, unspecified: Secondary | ICD-10-CM | POA: Diagnosis not present

## 2021-09-07 DIAGNOSIS — M6281 Muscle weakness (generalized): Secondary | ICD-10-CM | POA: Diagnosis not present

## 2021-09-10 DIAGNOSIS — M6281 Muscle weakness (generalized): Secondary | ICD-10-CM | POA: Diagnosis not present

## 2021-09-10 DIAGNOSIS — J449 Chronic obstructive pulmonary disease, unspecified: Secondary | ICD-10-CM | POA: Diagnosis not present

## 2021-09-10 DIAGNOSIS — R2689 Other abnormalities of gait and mobility: Secondary | ICD-10-CM | POA: Diagnosis not present

## 2021-09-10 DIAGNOSIS — R2681 Unsteadiness on feet: Secondary | ICD-10-CM | POA: Diagnosis not present

## 2021-09-11 DIAGNOSIS — M6281 Muscle weakness (generalized): Secondary | ICD-10-CM | POA: Diagnosis not present

## 2021-09-11 DIAGNOSIS — J449 Chronic obstructive pulmonary disease, unspecified: Secondary | ICD-10-CM | POA: Diagnosis not present

## 2021-09-11 DIAGNOSIS — R2681 Unsteadiness on feet: Secondary | ICD-10-CM | POA: Diagnosis not present

## 2021-09-11 DIAGNOSIS — R2689 Other abnormalities of gait and mobility: Secondary | ICD-10-CM | POA: Diagnosis not present

## 2021-09-12 DIAGNOSIS — M6281 Muscle weakness (generalized): Secondary | ICD-10-CM | POA: Diagnosis not present

## 2021-09-12 DIAGNOSIS — R2681 Unsteadiness on feet: Secondary | ICD-10-CM | POA: Diagnosis not present

## 2021-09-12 DIAGNOSIS — R2689 Other abnormalities of gait and mobility: Secondary | ICD-10-CM | POA: Diagnosis not present

## 2021-09-12 DIAGNOSIS — J449 Chronic obstructive pulmonary disease, unspecified: Secondary | ICD-10-CM | POA: Diagnosis not present

## 2021-09-14 DIAGNOSIS — M6281 Muscle weakness (generalized): Secondary | ICD-10-CM | POA: Diagnosis not present

## 2021-09-14 DIAGNOSIS — R2689 Other abnormalities of gait and mobility: Secondary | ICD-10-CM | POA: Diagnosis not present

## 2021-09-14 DIAGNOSIS — J449 Chronic obstructive pulmonary disease, unspecified: Secondary | ICD-10-CM | POA: Diagnosis not present

## 2021-09-14 DIAGNOSIS — R2681 Unsteadiness on feet: Secondary | ICD-10-CM | POA: Diagnosis not present

## 2021-09-17 DIAGNOSIS — R2681 Unsteadiness on feet: Secondary | ICD-10-CM | POA: Diagnosis not present

## 2021-09-17 DIAGNOSIS — R2689 Other abnormalities of gait and mobility: Secondary | ICD-10-CM | POA: Diagnosis not present

## 2021-09-17 DIAGNOSIS — M6281 Muscle weakness (generalized): Secondary | ICD-10-CM | POA: Diagnosis not present

## 2021-09-17 DIAGNOSIS — J449 Chronic obstructive pulmonary disease, unspecified: Secondary | ICD-10-CM | POA: Diagnosis not present

## 2021-09-18 DIAGNOSIS — R2681 Unsteadiness on feet: Secondary | ICD-10-CM | POA: Diagnosis not present

## 2021-09-18 DIAGNOSIS — J449 Chronic obstructive pulmonary disease, unspecified: Secondary | ICD-10-CM | POA: Diagnosis not present

## 2021-09-18 DIAGNOSIS — M6281 Muscle weakness (generalized): Secondary | ICD-10-CM | POA: Diagnosis not present

## 2021-09-18 DIAGNOSIS — R2689 Other abnormalities of gait and mobility: Secondary | ICD-10-CM | POA: Diagnosis not present

## 2021-09-19 DIAGNOSIS — R2689 Other abnormalities of gait and mobility: Secondary | ICD-10-CM | POA: Diagnosis not present

## 2021-09-19 DIAGNOSIS — R2681 Unsteadiness on feet: Secondary | ICD-10-CM | POA: Diagnosis not present

## 2021-09-19 DIAGNOSIS — J449 Chronic obstructive pulmonary disease, unspecified: Secondary | ICD-10-CM | POA: Diagnosis not present

## 2021-09-19 DIAGNOSIS — M6281 Muscle weakness (generalized): Secondary | ICD-10-CM | POA: Diagnosis not present

## 2021-09-21 DIAGNOSIS — R2681 Unsteadiness on feet: Secondary | ICD-10-CM | POA: Diagnosis not present

## 2021-09-21 DIAGNOSIS — M6281 Muscle weakness (generalized): Secondary | ICD-10-CM | POA: Diagnosis not present

## 2021-09-21 DIAGNOSIS — J449 Chronic obstructive pulmonary disease, unspecified: Secondary | ICD-10-CM | POA: Diagnosis not present

## 2021-09-21 DIAGNOSIS — R2689 Other abnormalities of gait and mobility: Secondary | ICD-10-CM | POA: Diagnosis not present

## 2021-09-25 DIAGNOSIS — R2681 Unsteadiness on feet: Secondary | ICD-10-CM | POA: Diagnosis not present

## 2021-09-25 DIAGNOSIS — J449 Chronic obstructive pulmonary disease, unspecified: Secondary | ICD-10-CM | POA: Diagnosis not present

## 2021-09-25 DIAGNOSIS — R2689 Other abnormalities of gait and mobility: Secondary | ICD-10-CM | POA: Diagnosis not present

## 2021-09-25 DIAGNOSIS — M6281 Muscle weakness (generalized): Secondary | ICD-10-CM | POA: Diagnosis not present

## 2021-09-26 DIAGNOSIS — M6281 Muscle weakness (generalized): Secondary | ICD-10-CM | POA: Diagnosis not present

## 2021-09-26 DIAGNOSIS — R2681 Unsteadiness on feet: Secondary | ICD-10-CM | POA: Diagnosis not present

## 2021-09-26 DIAGNOSIS — R2689 Other abnormalities of gait and mobility: Secondary | ICD-10-CM | POA: Diagnosis not present

## 2021-09-26 DIAGNOSIS — J449 Chronic obstructive pulmonary disease, unspecified: Secondary | ICD-10-CM | POA: Diagnosis not present

## 2021-09-27 DIAGNOSIS — J449 Chronic obstructive pulmonary disease, unspecified: Secondary | ICD-10-CM | POA: Diagnosis not present

## 2021-09-27 DIAGNOSIS — R2681 Unsteadiness on feet: Secondary | ICD-10-CM | POA: Diagnosis not present

## 2021-09-27 DIAGNOSIS — M6281 Muscle weakness (generalized): Secondary | ICD-10-CM | POA: Diagnosis not present

## 2021-09-27 DIAGNOSIS — R2689 Other abnormalities of gait and mobility: Secondary | ICD-10-CM | POA: Diagnosis not present

## 2021-09-28 DIAGNOSIS — J449 Chronic obstructive pulmonary disease, unspecified: Secondary | ICD-10-CM | POA: Diagnosis not present

## 2021-09-28 DIAGNOSIS — R2689 Other abnormalities of gait and mobility: Secondary | ICD-10-CM | POA: Diagnosis not present

## 2021-09-28 DIAGNOSIS — M6281 Muscle weakness (generalized): Secondary | ICD-10-CM | POA: Diagnosis not present

## 2021-09-28 DIAGNOSIS — R2681 Unsteadiness on feet: Secondary | ICD-10-CM | POA: Diagnosis not present

## 2021-09-30 DIAGNOSIS — R2681 Unsteadiness on feet: Secondary | ICD-10-CM | POA: Diagnosis not present

## 2021-09-30 DIAGNOSIS — M6281 Muscle weakness (generalized): Secondary | ICD-10-CM | POA: Diagnosis not present

## 2021-09-30 DIAGNOSIS — J449 Chronic obstructive pulmonary disease, unspecified: Secondary | ICD-10-CM | POA: Diagnosis not present

## 2021-09-30 DIAGNOSIS — R2689 Other abnormalities of gait and mobility: Secondary | ICD-10-CM | POA: Diagnosis not present

## 2021-10-02 DIAGNOSIS — J449 Chronic obstructive pulmonary disease, unspecified: Secondary | ICD-10-CM | POA: Diagnosis not present

## 2021-10-02 DIAGNOSIS — R2689 Other abnormalities of gait and mobility: Secondary | ICD-10-CM | POA: Diagnosis not present

## 2021-10-02 DIAGNOSIS — M6281 Muscle weakness (generalized): Secondary | ICD-10-CM | POA: Diagnosis not present

## 2021-10-02 DIAGNOSIS — R2681 Unsteadiness on feet: Secondary | ICD-10-CM | POA: Diagnosis not present

## 2021-10-03 DIAGNOSIS — R2689 Other abnormalities of gait and mobility: Secondary | ICD-10-CM | POA: Diagnosis not present

## 2021-10-03 DIAGNOSIS — J449 Chronic obstructive pulmonary disease, unspecified: Secondary | ICD-10-CM | POA: Diagnosis not present

## 2021-10-03 DIAGNOSIS — R2681 Unsteadiness on feet: Secondary | ICD-10-CM | POA: Diagnosis not present

## 2021-10-03 DIAGNOSIS — M6281 Muscle weakness (generalized): Secondary | ICD-10-CM | POA: Diagnosis not present

## 2021-10-04 DIAGNOSIS — J449 Chronic obstructive pulmonary disease, unspecified: Secondary | ICD-10-CM | POA: Diagnosis not present

## 2021-10-04 DIAGNOSIS — G4701 Insomnia due to medical condition: Secondary | ICD-10-CM | POA: Diagnosis not present

## 2021-10-04 DIAGNOSIS — R2681 Unsteadiness on feet: Secondary | ICD-10-CM | POA: Diagnosis not present

## 2021-10-04 DIAGNOSIS — M6281 Muscle weakness (generalized): Secondary | ICD-10-CM | POA: Diagnosis not present

## 2021-10-04 DIAGNOSIS — R2689 Other abnormalities of gait and mobility: Secondary | ICD-10-CM | POA: Diagnosis not present

## 2021-10-05 DIAGNOSIS — R2681 Unsteadiness on feet: Secondary | ICD-10-CM | POA: Diagnosis not present

## 2021-10-05 DIAGNOSIS — M6281 Muscle weakness (generalized): Secondary | ICD-10-CM | POA: Diagnosis not present

## 2021-10-05 DIAGNOSIS — J449 Chronic obstructive pulmonary disease, unspecified: Secondary | ICD-10-CM | POA: Diagnosis not present

## 2021-10-05 DIAGNOSIS — R2689 Other abnormalities of gait and mobility: Secondary | ICD-10-CM | POA: Diagnosis not present

## 2021-10-08 DIAGNOSIS — I509 Heart failure, unspecified: Secondary | ICD-10-CM | POA: Diagnosis not present

## 2021-10-08 DIAGNOSIS — R2681 Unsteadiness on feet: Secondary | ICD-10-CM | POA: Diagnosis not present

## 2021-10-08 DIAGNOSIS — R2689 Other abnormalities of gait and mobility: Secondary | ICD-10-CM | POA: Diagnosis not present

## 2021-10-08 DIAGNOSIS — J449 Chronic obstructive pulmonary disease, unspecified: Secondary | ICD-10-CM | POA: Diagnosis not present

## 2021-10-08 DIAGNOSIS — M6281 Muscle weakness (generalized): Secondary | ICD-10-CM | POA: Diagnosis not present

## 2021-10-08 DIAGNOSIS — I739 Peripheral vascular disease, unspecified: Secondary | ICD-10-CM | POA: Diagnosis not present

## 2021-10-08 DIAGNOSIS — Z89412 Acquired absence of left great toe: Secondary | ICD-10-CM | POA: Diagnosis not present

## 2021-10-09 DIAGNOSIS — M6281 Muscle weakness (generalized): Secondary | ICD-10-CM | POA: Diagnosis not present

## 2021-10-09 DIAGNOSIS — R2689 Other abnormalities of gait and mobility: Secondary | ICD-10-CM | POA: Diagnosis not present

## 2021-10-09 DIAGNOSIS — J449 Chronic obstructive pulmonary disease, unspecified: Secondary | ICD-10-CM | POA: Diagnosis not present

## 2021-10-09 DIAGNOSIS — R2681 Unsteadiness on feet: Secondary | ICD-10-CM | POA: Diagnosis not present

## 2021-10-11 DIAGNOSIS — J449 Chronic obstructive pulmonary disease, unspecified: Secondary | ICD-10-CM | POA: Diagnosis not present

## 2021-10-11 DIAGNOSIS — M6281 Muscle weakness (generalized): Secondary | ICD-10-CM | POA: Diagnosis not present

## 2021-10-11 DIAGNOSIS — R2681 Unsteadiness on feet: Secondary | ICD-10-CM | POA: Diagnosis not present

## 2021-10-11 DIAGNOSIS — R2689 Other abnormalities of gait and mobility: Secondary | ICD-10-CM | POA: Diagnosis not present

## 2021-10-14 DIAGNOSIS — R2689 Other abnormalities of gait and mobility: Secondary | ICD-10-CM | POA: Diagnosis not present

## 2021-10-14 DIAGNOSIS — R2681 Unsteadiness on feet: Secondary | ICD-10-CM | POA: Diagnosis not present

## 2021-10-14 DIAGNOSIS — M6281 Muscle weakness (generalized): Secondary | ICD-10-CM | POA: Diagnosis not present

## 2021-10-14 DIAGNOSIS — J449 Chronic obstructive pulmonary disease, unspecified: Secondary | ICD-10-CM | POA: Diagnosis not present

## 2021-10-28 DIAGNOSIS — G4701 Insomnia due to medical condition: Secondary | ICD-10-CM | POA: Diagnosis not present

## 2021-11-07 DIAGNOSIS — M792 Neuralgia and neuritis, unspecified: Secondary | ICD-10-CM | POA: Diagnosis not present

## 2021-11-07 DIAGNOSIS — G8929 Other chronic pain: Secondary | ICD-10-CM | POA: Diagnosis not present

## 2021-11-07 DIAGNOSIS — M542 Cervicalgia: Secondary | ICD-10-CM | POA: Diagnosis not present

## 2021-11-20 DIAGNOSIS — N189 Chronic kidney disease, unspecified: Secondary | ICD-10-CM | POA: Diagnosis not present

## 2021-11-20 DIAGNOSIS — R2689 Other abnormalities of gait and mobility: Secondary | ICD-10-CM | POA: Diagnosis not present

## 2021-11-20 DIAGNOSIS — M6281 Muscle weakness (generalized): Secondary | ICD-10-CM | POA: Diagnosis not present

## 2021-11-20 DIAGNOSIS — I509 Heart failure, unspecified: Secondary | ICD-10-CM | POA: Diagnosis not present

## 2021-11-20 DIAGNOSIS — E1140 Type 2 diabetes mellitus with diabetic neuropathy, unspecified: Secondary | ICD-10-CM | POA: Diagnosis not present

## 2021-11-21 DIAGNOSIS — I509 Heart failure, unspecified: Secondary | ICD-10-CM | POA: Diagnosis not present

## 2021-11-21 DIAGNOSIS — M6281 Muscle weakness (generalized): Secondary | ICD-10-CM | POA: Diagnosis not present

## 2021-11-21 DIAGNOSIS — R2689 Other abnormalities of gait and mobility: Secondary | ICD-10-CM | POA: Diagnosis not present

## 2021-11-21 DIAGNOSIS — E1140 Type 2 diabetes mellitus with diabetic neuropathy, unspecified: Secondary | ICD-10-CM | POA: Diagnosis not present

## 2021-11-21 DIAGNOSIS — N189 Chronic kidney disease, unspecified: Secondary | ICD-10-CM | POA: Diagnosis not present

## 2021-11-22 DIAGNOSIS — I509 Heart failure, unspecified: Secondary | ICD-10-CM | POA: Diagnosis not present

## 2021-11-22 DIAGNOSIS — M6281 Muscle weakness (generalized): Secondary | ICD-10-CM | POA: Diagnosis not present

## 2021-11-22 DIAGNOSIS — N189 Chronic kidney disease, unspecified: Secondary | ICD-10-CM | POA: Diagnosis not present

## 2021-11-22 DIAGNOSIS — R2689 Other abnormalities of gait and mobility: Secondary | ICD-10-CM | POA: Diagnosis not present

## 2021-11-22 DIAGNOSIS — E1140 Type 2 diabetes mellitus with diabetic neuropathy, unspecified: Secondary | ICD-10-CM | POA: Diagnosis not present

## 2021-11-23 DIAGNOSIS — I509 Heart failure, unspecified: Secondary | ICD-10-CM | POA: Diagnosis not present

## 2021-11-23 DIAGNOSIS — E1140 Type 2 diabetes mellitus with diabetic neuropathy, unspecified: Secondary | ICD-10-CM | POA: Diagnosis not present

## 2021-11-23 DIAGNOSIS — N189 Chronic kidney disease, unspecified: Secondary | ICD-10-CM | POA: Diagnosis not present

## 2021-11-23 DIAGNOSIS — M6281 Muscle weakness (generalized): Secondary | ICD-10-CM | POA: Diagnosis not present

## 2021-11-23 DIAGNOSIS — R2689 Other abnormalities of gait and mobility: Secondary | ICD-10-CM | POA: Diagnosis not present

## 2021-11-25 DIAGNOSIS — I509 Heart failure, unspecified: Secondary | ICD-10-CM | POA: Diagnosis not present

## 2021-11-25 DIAGNOSIS — E1140 Type 2 diabetes mellitus with diabetic neuropathy, unspecified: Secondary | ICD-10-CM | POA: Diagnosis not present

## 2021-11-25 DIAGNOSIS — N189 Chronic kidney disease, unspecified: Secondary | ICD-10-CM | POA: Diagnosis not present

## 2021-11-25 DIAGNOSIS — R2689 Other abnormalities of gait and mobility: Secondary | ICD-10-CM | POA: Diagnosis not present

## 2021-11-25 DIAGNOSIS — M6281 Muscle weakness (generalized): Secondary | ICD-10-CM | POA: Diagnosis not present

## 2021-11-26 DIAGNOSIS — E1140 Type 2 diabetes mellitus with diabetic neuropathy, unspecified: Secondary | ICD-10-CM | POA: Diagnosis not present

## 2021-11-26 DIAGNOSIS — R2689 Other abnormalities of gait and mobility: Secondary | ICD-10-CM | POA: Diagnosis not present

## 2021-11-26 DIAGNOSIS — N189 Chronic kidney disease, unspecified: Secondary | ICD-10-CM | POA: Diagnosis not present

## 2021-11-26 DIAGNOSIS — M6281 Muscle weakness (generalized): Secondary | ICD-10-CM | POA: Diagnosis not present

## 2021-11-26 DIAGNOSIS — I509 Heart failure, unspecified: Secondary | ICD-10-CM | POA: Diagnosis not present

## 2021-11-27 DIAGNOSIS — E1140 Type 2 diabetes mellitus with diabetic neuropathy, unspecified: Secondary | ICD-10-CM | POA: Diagnosis not present

## 2021-11-27 DIAGNOSIS — N189 Chronic kidney disease, unspecified: Secondary | ICD-10-CM | POA: Diagnosis not present

## 2021-11-27 DIAGNOSIS — I509 Heart failure, unspecified: Secondary | ICD-10-CM | POA: Diagnosis not present

## 2021-11-27 DIAGNOSIS — R2689 Other abnormalities of gait and mobility: Secondary | ICD-10-CM | POA: Diagnosis not present

## 2021-11-27 DIAGNOSIS — M6281 Muscle weakness (generalized): Secondary | ICD-10-CM | POA: Diagnosis not present

## 2021-11-28 DIAGNOSIS — I10 Essential (primary) hypertension: Secondary | ICD-10-CM | POA: Diagnosis not present

## 2021-11-28 DIAGNOSIS — N189 Chronic kidney disease, unspecified: Secondary | ICD-10-CM | POA: Diagnosis not present

## 2021-11-28 DIAGNOSIS — D649 Anemia, unspecified: Secondary | ICD-10-CM | POA: Diagnosis not present

## 2021-11-28 DIAGNOSIS — I509 Heart failure, unspecified: Secondary | ICD-10-CM | POA: Diagnosis not present

## 2021-11-28 DIAGNOSIS — R2689 Other abnormalities of gait and mobility: Secondary | ICD-10-CM | POA: Diagnosis not present

## 2021-11-28 DIAGNOSIS — M6281 Muscle weakness (generalized): Secondary | ICD-10-CM | POA: Diagnosis not present

## 2021-11-28 DIAGNOSIS — E1140 Type 2 diabetes mellitus with diabetic neuropathy, unspecified: Secondary | ICD-10-CM | POA: Diagnosis not present

## 2021-11-29 DIAGNOSIS — I509 Heart failure, unspecified: Secondary | ICD-10-CM | POA: Diagnosis not present

## 2021-11-29 DIAGNOSIS — R2689 Other abnormalities of gait and mobility: Secondary | ICD-10-CM | POA: Diagnosis not present

## 2021-11-29 DIAGNOSIS — N189 Chronic kidney disease, unspecified: Secondary | ICD-10-CM | POA: Diagnosis not present

## 2021-11-29 DIAGNOSIS — E1140 Type 2 diabetes mellitus with diabetic neuropathy, unspecified: Secondary | ICD-10-CM | POA: Diagnosis not present

## 2021-11-29 DIAGNOSIS — M6281 Muscle weakness (generalized): Secondary | ICD-10-CM | POA: Diagnosis not present

## 2021-11-30 DIAGNOSIS — M6281 Muscle weakness (generalized): Secondary | ICD-10-CM | POA: Diagnosis not present

## 2021-11-30 DIAGNOSIS — I509 Heart failure, unspecified: Secondary | ICD-10-CM | POA: Diagnosis not present

## 2021-11-30 DIAGNOSIS — N189 Chronic kidney disease, unspecified: Secondary | ICD-10-CM | POA: Diagnosis not present

## 2021-11-30 DIAGNOSIS — R2689 Other abnormalities of gait and mobility: Secondary | ICD-10-CM | POA: Diagnosis not present

## 2021-11-30 DIAGNOSIS — E1140 Type 2 diabetes mellitus with diabetic neuropathy, unspecified: Secondary | ICD-10-CM | POA: Diagnosis not present

## 2021-12-03 DIAGNOSIS — I509 Heart failure, unspecified: Secondary | ICD-10-CM | POA: Diagnosis not present

## 2021-12-03 DIAGNOSIS — R2689 Other abnormalities of gait and mobility: Secondary | ICD-10-CM | POA: Diagnosis not present

## 2021-12-03 DIAGNOSIS — E1140 Type 2 diabetes mellitus with diabetic neuropathy, unspecified: Secondary | ICD-10-CM | POA: Diagnosis not present

## 2021-12-03 DIAGNOSIS — N189 Chronic kidney disease, unspecified: Secondary | ICD-10-CM | POA: Diagnosis not present

## 2021-12-03 DIAGNOSIS — M6281 Muscle weakness (generalized): Secondary | ICD-10-CM | POA: Diagnosis not present

## 2021-12-04 DIAGNOSIS — M6281 Muscle weakness (generalized): Secondary | ICD-10-CM | POA: Diagnosis not present

## 2021-12-04 DIAGNOSIS — I509 Heart failure, unspecified: Secondary | ICD-10-CM | POA: Diagnosis not present

## 2021-12-04 DIAGNOSIS — E1140 Type 2 diabetes mellitus with diabetic neuropathy, unspecified: Secondary | ICD-10-CM | POA: Diagnosis not present

## 2021-12-04 DIAGNOSIS — N189 Chronic kidney disease, unspecified: Secondary | ICD-10-CM | POA: Diagnosis not present

## 2021-12-04 DIAGNOSIS — R2689 Other abnormalities of gait and mobility: Secondary | ICD-10-CM | POA: Diagnosis not present

## 2021-12-05 DIAGNOSIS — I509 Heart failure, unspecified: Secondary | ICD-10-CM | POA: Diagnosis not present

## 2021-12-05 DIAGNOSIS — R2689 Other abnormalities of gait and mobility: Secondary | ICD-10-CM | POA: Diagnosis not present

## 2021-12-05 DIAGNOSIS — N189 Chronic kidney disease, unspecified: Secondary | ICD-10-CM | POA: Diagnosis not present

## 2021-12-05 DIAGNOSIS — E1140 Type 2 diabetes mellitus with diabetic neuropathy, unspecified: Secondary | ICD-10-CM | POA: Diagnosis not present

## 2021-12-05 DIAGNOSIS — M6281 Muscle weakness (generalized): Secondary | ICD-10-CM | POA: Diagnosis not present

## 2021-12-06 DIAGNOSIS — R2689 Other abnormalities of gait and mobility: Secondary | ICD-10-CM | POA: Diagnosis not present

## 2021-12-06 DIAGNOSIS — M6281 Muscle weakness (generalized): Secondary | ICD-10-CM | POA: Diagnosis not present

## 2021-12-06 DIAGNOSIS — I509 Heart failure, unspecified: Secondary | ICD-10-CM | POA: Diagnosis not present

## 2021-12-06 DIAGNOSIS — E1140 Type 2 diabetes mellitus with diabetic neuropathy, unspecified: Secondary | ICD-10-CM | POA: Diagnosis not present

## 2021-12-06 DIAGNOSIS — N189 Chronic kidney disease, unspecified: Secondary | ICD-10-CM | POA: Diagnosis not present

## 2021-12-07 DIAGNOSIS — R2689 Other abnormalities of gait and mobility: Secondary | ICD-10-CM | POA: Diagnosis not present

## 2021-12-07 DIAGNOSIS — E1140 Type 2 diabetes mellitus with diabetic neuropathy, unspecified: Secondary | ICD-10-CM | POA: Diagnosis not present

## 2021-12-07 DIAGNOSIS — N189 Chronic kidney disease, unspecified: Secondary | ICD-10-CM | POA: Diagnosis not present

## 2021-12-07 DIAGNOSIS — I509 Heart failure, unspecified: Secondary | ICD-10-CM | POA: Diagnosis not present

## 2021-12-07 DIAGNOSIS — M6281 Muscle weakness (generalized): Secondary | ICD-10-CM | POA: Diagnosis not present

## 2021-12-08 DIAGNOSIS — R2689 Other abnormalities of gait and mobility: Secondary | ICD-10-CM | POA: Diagnosis not present

## 2021-12-08 DIAGNOSIS — N189 Chronic kidney disease, unspecified: Secondary | ICD-10-CM | POA: Diagnosis not present

## 2021-12-08 DIAGNOSIS — M6281 Muscle weakness (generalized): Secondary | ICD-10-CM | POA: Diagnosis not present

## 2021-12-08 DIAGNOSIS — I509 Heart failure, unspecified: Secondary | ICD-10-CM | POA: Diagnosis not present

## 2021-12-08 DIAGNOSIS — E1140 Type 2 diabetes mellitus with diabetic neuropathy, unspecified: Secondary | ICD-10-CM | POA: Diagnosis not present

## 2021-12-09 DIAGNOSIS — R2689 Other abnormalities of gait and mobility: Secondary | ICD-10-CM | POA: Diagnosis not present

## 2021-12-09 DIAGNOSIS — I509 Heart failure, unspecified: Secondary | ICD-10-CM | POA: Diagnosis not present

## 2021-12-09 DIAGNOSIS — M6281 Muscle weakness (generalized): Secondary | ICD-10-CM | POA: Diagnosis not present

## 2021-12-09 DIAGNOSIS — N189 Chronic kidney disease, unspecified: Secondary | ICD-10-CM | POA: Diagnosis not present

## 2021-12-09 DIAGNOSIS — E1140 Type 2 diabetes mellitus with diabetic neuropathy, unspecified: Secondary | ICD-10-CM | POA: Diagnosis not present

## 2021-12-10 DIAGNOSIS — E1140 Type 2 diabetes mellitus with diabetic neuropathy, unspecified: Secondary | ICD-10-CM | POA: Diagnosis not present

## 2021-12-10 DIAGNOSIS — I509 Heart failure, unspecified: Secondary | ICD-10-CM | POA: Diagnosis not present

## 2021-12-10 DIAGNOSIS — N189 Chronic kidney disease, unspecified: Secondary | ICD-10-CM | POA: Diagnosis not present

## 2021-12-10 DIAGNOSIS — M6281 Muscle weakness (generalized): Secondary | ICD-10-CM | POA: Diagnosis not present

## 2021-12-10 DIAGNOSIS — R2689 Other abnormalities of gait and mobility: Secondary | ICD-10-CM | POA: Diagnosis not present

## 2021-12-11 DIAGNOSIS — I509 Heart failure, unspecified: Secondary | ICD-10-CM | POA: Diagnosis not present

## 2021-12-11 DIAGNOSIS — R2689 Other abnormalities of gait and mobility: Secondary | ICD-10-CM | POA: Diagnosis not present

## 2021-12-11 DIAGNOSIS — M6281 Muscle weakness (generalized): Secondary | ICD-10-CM | POA: Diagnosis not present

## 2021-12-11 DIAGNOSIS — N189 Chronic kidney disease, unspecified: Secondary | ICD-10-CM | POA: Diagnosis not present

## 2021-12-11 DIAGNOSIS — E1140 Type 2 diabetes mellitus with diabetic neuropathy, unspecified: Secondary | ICD-10-CM | POA: Diagnosis not present

## 2021-12-12 DIAGNOSIS — E1140 Type 2 diabetes mellitus with diabetic neuropathy, unspecified: Secondary | ICD-10-CM | POA: Diagnosis not present

## 2021-12-12 DIAGNOSIS — I509 Heart failure, unspecified: Secondary | ICD-10-CM | POA: Diagnosis not present

## 2021-12-12 DIAGNOSIS — M6281 Muscle weakness (generalized): Secondary | ICD-10-CM | POA: Diagnosis not present

## 2021-12-12 DIAGNOSIS — N189 Chronic kidney disease, unspecified: Secondary | ICD-10-CM | POA: Diagnosis not present

## 2021-12-12 DIAGNOSIS — R2689 Other abnormalities of gait and mobility: Secondary | ICD-10-CM | POA: Diagnosis not present

## 2021-12-12 DIAGNOSIS — I10 Essential (primary) hypertension: Secondary | ICD-10-CM | POA: Diagnosis not present

## 2021-12-13 DIAGNOSIS — E1140 Type 2 diabetes mellitus with diabetic neuropathy, unspecified: Secondary | ICD-10-CM | POA: Diagnosis not present

## 2021-12-13 DIAGNOSIS — M6281 Muscle weakness (generalized): Secondary | ICD-10-CM | POA: Diagnosis not present

## 2021-12-13 DIAGNOSIS — I739 Peripheral vascular disease, unspecified: Secondary | ICD-10-CM | POA: Diagnosis not present

## 2021-12-13 DIAGNOSIS — R2689 Other abnormalities of gait and mobility: Secondary | ICD-10-CM | POA: Diagnosis not present

## 2021-12-13 DIAGNOSIS — N189 Chronic kidney disease, unspecified: Secondary | ICD-10-CM | POA: Diagnosis not present

## 2021-12-13 DIAGNOSIS — I509 Heart failure, unspecified: Secondary | ICD-10-CM | POA: Diagnosis not present

## 2021-12-14 ENCOUNTER — Other Ambulatory Visit: Payer: Self-pay

## 2021-12-14 DIAGNOSIS — C9110 Chronic lymphocytic leukemia of B-cell type not having achieved remission: Secondary | ICD-10-CM

## 2021-12-17 ENCOUNTER — Inpatient Hospital Stay: Payer: Medicare Other | Admitting: Hematology

## 2021-12-17 ENCOUNTER — Inpatient Hospital Stay: Payer: Medicare Other | Attending: Hematology

## 2021-12-21 DIAGNOSIS — I10 Essential (primary) hypertension: Secondary | ICD-10-CM | POA: Diagnosis not present

## 2021-12-27 DIAGNOSIS — G4701 Insomnia due to medical condition: Secondary | ICD-10-CM | POA: Diagnosis not present

## 2022-01-22 DIAGNOSIS — E1140 Type 2 diabetes mellitus with diabetic neuropathy, unspecified: Secondary | ICD-10-CM | POA: Diagnosis not present

## 2022-01-22 DIAGNOSIS — E1151 Type 2 diabetes mellitus with diabetic peripheral angiopathy without gangrene: Secondary | ICD-10-CM | POA: Diagnosis not present

## 2022-01-22 DIAGNOSIS — S98112D Complete traumatic amputation of left great toe, subsequent encounter: Secondary | ICD-10-CM | POA: Diagnosis not present

## 2022-01-22 DIAGNOSIS — I1310 Hypertensive heart and chronic kidney disease without heart failure, with stage 1 through stage 4 chronic kidney disease, or unspecified chronic kidney disease: Secondary | ICD-10-CM | POA: Diagnosis not present

## 2022-01-23 DIAGNOSIS — I10 Essential (primary) hypertension: Secondary | ICD-10-CM | POA: Diagnosis not present

## 2022-01-23 DIAGNOSIS — E559 Vitamin D deficiency, unspecified: Secondary | ICD-10-CM | POA: Diagnosis not present

## 2022-01-23 DIAGNOSIS — E08311 Diabetes mellitus due to underlying condition with unspecified diabetic retinopathy with macular edema: Secondary | ICD-10-CM | POA: Diagnosis not present

## 2022-01-29 DIAGNOSIS — E43 Unspecified severe protein-calorie malnutrition: Secondary | ICD-10-CM | POA: Diagnosis not present

## 2022-01-29 DIAGNOSIS — D464 Refractory anemia, unspecified: Secondary | ICD-10-CM | POA: Diagnosis not present

## 2022-01-29 DIAGNOSIS — E559 Vitamin D deficiency, unspecified: Secondary | ICD-10-CM | POA: Diagnosis not present

## 2022-01-29 DIAGNOSIS — N183 Chronic kidney disease, stage 3 unspecified: Secondary | ICD-10-CM | POA: Diagnosis not present

## 2022-02-05 DIAGNOSIS — I10 Essential (primary) hypertension: Secondary | ICD-10-CM | POA: Diagnosis not present

## 2022-02-05 DIAGNOSIS — D509 Iron deficiency anemia, unspecified: Secondary | ICD-10-CM | POA: Diagnosis not present

## 2022-02-09 DIAGNOSIS — D509 Iron deficiency anemia, unspecified: Secondary | ICD-10-CM | POA: Diagnosis not present

## 2022-02-09 DIAGNOSIS — I10 Essential (primary) hypertension: Secondary | ICD-10-CM | POA: Diagnosis not present

## 2022-02-13 DIAGNOSIS — Z4781 Encounter for orthopedic aftercare following surgical amputation: Secondary | ICD-10-CM | POA: Diagnosis not present

## 2022-02-14 DIAGNOSIS — Z4781 Encounter for orthopedic aftercare following surgical amputation: Secondary | ICD-10-CM | POA: Diagnosis not present

## 2022-02-16 DIAGNOSIS — Z4781 Encounter for orthopedic aftercare following surgical amputation: Secondary | ICD-10-CM | POA: Diagnosis not present

## 2022-02-18 DIAGNOSIS — Z4781 Encounter for orthopedic aftercare following surgical amputation: Secondary | ICD-10-CM | POA: Diagnosis not present

## 2022-02-19 DIAGNOSIS — Z4781 Encounter for orthopedic aftercare following surgical amputation: Secondary | ICD-10-CM | POA: Diagnosis not present

## 2022-02-21 DIAGNOSIS — Z4781 Encounter for orthopedic aftercare following surgical amputation: Secondary | ICD-10-CM | POA: Diagnosis not present

## 2022-02-22 DIAGNOSIS — Z4781 Encounter for orthopedic aftercare following surgical amputation: Secondary | ICD-10-CM | POA: Diagnosis not present

## 2022-02-23 DIAGNOSIS — Z4781 Encounter for orthopedic aftercare following surgical amputation: Secondary | ICD-10-CM | POA: Diagnosis not present

## 2022-02-26 DIAGNOSIS — Z4781 Encounter for orthopedic aftercare following surgical amputation: Secondary | ICD-10-CM | POA: Diagnosis not present

## 2022-02-27 DIAGNOSIS — Z4781 Encounter for orthopedic aftercare following surgical amputation: Secondary | ICD-10-CM | POA: Diagnosis not present

## 2022-02-28 DIAGNOSIS — Z4781 Encounter for orthopedic aftercare following surgical amputation: Secondary | ICD-10-CM | POA: Diagnosis not present

## 2022-03-01 DIAGNOSIS — Z4781 Encounter for orthopedic aftercare following surgical amputation: Secondary | ICD-10-CM | POA: Diagnosis not present

## 2022-03-02 DIAGNOSIS — Z4781 Encounter for orthopedic aftercare following surgical amputation: Secondary | ICD-10-CM | POA: Diagnosis not present

## 2022-03-03 DIAGNOSIS — Z4781 Encounter for orthopedic aftercare following surgical amputation: Secondary | ICD-10-CM | POA: Diagnosis not present

## 2022-03-04 DIAGNOSIS — D464 Refractory anemia, unspecified: Secondary | ICD-10-CM | POA: Diagnosis not present

## 2022-03-04 DIAGNOSIS — E1165 Type 2 diabetes mellitus with hyperglycemia: Secondary | ICD-10-CM | POA: Diagnosis not present

## 2022-03-15 DIAGNOSIS — E559 Vitamin D deficiency, unspecified: Secondary | ICD-10-CM | POA: Diagnosis not present

## 2022-03-27 DIAGNOSIS — E559 Vitamin D deficiency, unspecified: Secondary | ICD-10-CM | POA: Diagnosis not present

## 2022-03-27 DIAGNOSIS — E785 Hyperlipidemia, unspecified: Secondary | ICD-10-CM | POA: Diagnosis not present

## 2022-03-27 DIAGNOSIS — F0282 Dementia in other diseases classified elsewhere, unspecified severity, with psychotic disturbance: Secondary | ICD-10-CM | POA: Diagnosis not present

## 2022-03-27 DIAGNOSIS — E1165 Type 2 diabetes mellitus with hyperglycemia: Secondary | ICD-10-CM | POA: Diagnosis not present

## 2022-04-02 DIAGNOSIS — E559 Vitamin D deficiency, unspecified: Secondary | ICD-10-CM | POA: Diagnosis not present

## 2022-04-08 DIAGNOSIS — N189 Chronic kidney disease, unspecified: Secondary | ICD-10-CM | POA: Diagnosis not present

## 2022-04-08 DIAGNOSIS — R2681 Unsteadiness on feet: Secondary | ICD-10-CM | POA: Diagnosis not present

## 2022-04-08 DIAGNOSIS — R2689 Other abnormalities of gait and mobility: Secondary | ICD-10-CM | POA: Diagnosis not present

## 2022-04-08 DIAGNOSIS — J449 Chronic obstructive pulmonary disease, unspecified: Secondary | ICD-10-CM | POA: Diagnosis not present

## 2022-04-08 DIAGNOSIS — W1830XA Fall on same level, unspecified, initial encounter: Secondary | ICD-10-CM | POA: Diagnosis not present

## 2022-04-08 DIAGNOSIS — M6281 Muscle weakness (generalized): Secondary | ICD-10-CM | POA: Diagnosis not present

## 2022-04-09 DIAGNOSIS — R2681 Unsteadiness on feet: Secondary | ICD-10-CM | POA: Diagnosis not present

## 2022-04-09 DIAGNOSIS — J449 Chronic obstructive pulmonary disease, unspecified: Secondary | ICD-10-CM | POA: Diagnosis not present

## 2022-04-09 DIAGNOSIS — N189 Chronic kidney disease, unspecified: Secondary | ICD-10-CM | POA: Diagnosis not present

## 2022-04-09 DIAGNOSIS — M6281 Muscle weakness (generalized): Secondary | ICD-10-CM | POA: Diagnosis not present

## 2022-04-09 DIAGNOSIS — R2689 Other abnormalities of gait and mobility: Secondary | ICD-10-CM | POA: Diagnosis not present

## 2022-04-10 DIAGNOSIS — R2689 Other abnormalities of gait and mobility: Secondary | ICD-10-CM | POA: Diagnosis not present

## 2022-04-10 DIAGNOSIS — N189 Chronic kidney disease, unspecified: Secondary | ICD-10-CM | POA: Diagnosis not present

## 2022-04-10 DIAGNOSIS — J449 Chronic obstructive pulmonary disease, unspecified: Secondary | ICD-10-CM | POA: Diagnosis not present

## 2022-04-10 DIAGNOSIS — R2681 Unsteadiness on feet: Secondary | ICD-10-CM | POA: Diagnosis not present

## 2022-04-10 DIAGNOSIS — M6281 Muscle weakness (generalized): Secondary | ICD-10-CM | POA: Diagnosis not present

## 2022-04-11 DIAGNOSIS — N189 Chronic kidney disease, unspecified: Secondary | ICD-10-CM | POA: Diagnosis not present

## 2022-04-11 DIAGNOSIS — J449 Chronic obstructive pulmonary disease, unspecified: Secondary | ICD-10-CM | POA: Diagnosis not present

## 2022-04-11 DIAGNOSIS — R2689 Other abnormalities of gait and mobility: Secondary | ICD-10-CM | POA: Diagnosis not present

## 2022-04-11 DIAGNOSIS — M6281 Muscle weakness (generalized): Secondary | ICD-10-CM | POA: Diagnosis not present

## 2022-04-11 DIAGNOSIS — R2681 Unsteadiness on feet: Secondary | ICD-10-CM | POA: Diagnosis not present

## 2022-04-12 DIAGNOSIS — M6281 Muscle weakness (generalized): Secondary | ICD-10-CM | POA: Diagnosis not present

## 2022-04-12 DIAGNOSIS — R2681 Unsteadiness on feet: Secondary | ICD-10-CM | POA: Diagnosis not present

## 2022-04-12 DIAGNOSIS — N189 Chronic kidney disease, unspecified: Secondary | ICD-10-CM | POA: Diagnosis not present

## 2022-04-12 DIAGNOSIS — J449 Chronic obstructive pulmonary disease, unspecified: Secondary | ICD-10-CM | POA: Diagnosis not present

## 2022-04-12 DIAGNOSIS — R2689 Other abnormalities of gait and mobility: Secondary | ICD-10-CM | POA: Diagnosis not present

## 2022-04-15 DIAGNOSIS — M6281 Muscle weakness (generalized): Secondary | ICD-10-CM | POA: Diagnosis not present

## 2022-04-15 DIAGNOSIS — J449 Chronic obstructive pulmonary disease, unspecified: Secondary | ICD-10-CM | POA: Diagnosis not present

## 2022-04-15 DIAGNOSIS — N189 Chronic kidney disease, unspecified: Secondary | ICD-10-CM | POA: Diagnosis not present

## 2022-04-15 DIAGNOSIS — R2681 Unsteadiness on feet: Secondary | ICD-10-CM | POA: Diagnosis not present

## 2022-04-15 DIAGNOSIS — R2689 Other abnormalities of gait and mobility: Secondary | ICD-10-CM | POA: Diagnosis not present

## 2022-04-16 DIAGNOSIS — J449 Chronic obstructive pulmonary disease, unspecified: Secondary | ICD-10-CM | POA: Diagnosis not present

## 2022-04-16 DIAGNOSIS — N189 Chronic kidney disease, unspecified: Secondary | ICD-10-CM | POA: Diagnosis not present

## 2022-04-16 DIAGNOSIS — R2689 Other abnormalities of gait and mobility: Secondary | ICD-10-CM | POA: Diagnosis not present

## 2022-04-16 DIAGNOSIS — M6281 Muscle weakness (generalized): Secondary | ICD-10-CM | POA: Diagnosis not present

## 2022-04-16 DIAGNOSIS — R2681 Unsteadiness on feet: Secondary | ICD-10-CM | POA: Diagnosis not present

## 2022-04-17 DIAGNOSIS — J449 Chronic obstructive pulmonary disease, unspecified: Secondary | ICD-10-CM | POA: Diagnosis not present

## 2022-04-17 DIAGNOSIS — R2681 Unsteadiness on feet: Secondary | ICD-10-CM | POA: Diagnosis not present

## 2022-04-17 DIAGNOSIS — M6281 Muscle weakness (generalized): Secondary | ICD-10-CM | POA: Diagnosis not present

## 2022-04-17 DIAGNOSIS — N189 Chronic kidney disease, unspecified: Secondary | ICD-10-CM | POA: Diagnosis not present

## 2022-04-17 DIAGNOSIS — R2689 Other abnormalities of gait and mobility: Secondary | ICD-10-CM | POA: Diagnosis not present

## 2022-04-18 DIAGNOSIS — J449 Chronic obstructive pulmonary disease, unspecified: Secondary | ICD-10-CM | POA: Diagnosis not present

## 2022-04-18 DIAGNOSIS — R2681 Unsteadiness on feet: Secondary | ICD-10-CM | POA: Diagnosis not present

## 2022-04-18 DIAGNOSIS — R2689 Other abnormalities of gait and mobility: Secondary | ICD-10-CM | POA: Diagnosis not present

## 2022-04-18 DIAGNOSIS — L603 Nail dystrophy: Secondary | ICD-10-CM | POA: Diagnosis not present

## 2022-04-18 DIAGNOSIS — N189 Chronic kidney disease, unspecified: Secondary | ICD-10-CM | POA: Diagnosis not present

## 2022-04-18 DIAGNOSIS — M6281 Muscle weakness (generalized): Secondary | ICD-10-CM | POA: Diagnosis not present

## 2022-04-19 DIAGNOSIS — N189 Chronic kidney disease, unspecified: Secondary | ICD-10-CM | POA: Diagnosis not present

## 2022-04-19 DIAGNOSIS — J449 Chronic obstructive pulmonary disease, unspecified: Secondary | ICD-10-CM | POA: Diagnosis not present

## 2022-04-19 DIAGNOSIS — M6281 Muscle weakness (generalized): Secondary | ICD-10-CM | POA: Diagnosis not present

## 2022-04-19 DIAGNOSIS — R2689 Other abnormalities of gait and mobility: Secondary | ICD-10-CM | POA: Diagnosis not present

## 2022-04-19 DIAGNOSIS — R2681 Unsteadiness on feet: Secondary | ICD-10-CM | POA: Diagnosis not present

## 2022-04-22 DIAGNOSIS — M6281 Muscle weakness (generalized): Secondary | ICD-10-CM | POA: Diagnosis not present

## 2022-04-22 DIAGNOSIS — R2681 Unsteadiness on feet: Secondary | ICD-10-CM | POA: Diagnosis not present

## 2022-04-22 DIAGNOSIS — R2689 Other abnormalities of gait and mobility: Secondary | ICD-10-CM | POA: Diagnosis not present

## 2022-04-22 DIAGNOSIS — J449 Chronic obstructive pulmonary disease, unspecified: Secondary | ICD-10-CM | POA: Diagnosis not present

## 2022-04-22 DIAGNOSIS — N189 Chronic kidney disease, unspecified: Secondary | ICD-10-CM | POA: Diagnosis not present

## 2022-04-23 DIAGNOSIS — R2689 Other abnormalities of gait and mobility: Secondary | ICD-10-CM | POA: Diagnosis not present

## 2022-04-23 DIAGNOSIS — N189 Chronic kidney disease, unspecified: Secondary | ICD-10-CM | POA: Diagnosis not present

## 2022-04-23 DIAGNOSIS — M6281 Muscle weakness (generalized): Secondary | ICD-10-CM | POA: Diagnosis not present

## 2022-04-23 DIAGNOSIS — R2681 Unsteadiness on feet: Secondary | ICD-10-CM | POA: Diagnosis not present

## 2022-04-23 DIAGNOSIS — J449 Chronic obstructive pulmonary disease, unspecified: Secondary | ICD-10-CM | POA: Diagnosis not present

## 2022-04-24 DIAGNOSIS — N189 Chronic kidney disease, unspecified: Secondary | ICD-10-CM | POA: Diagnosis not present

## 2022-04-24 DIAGNOSIS — R2689 Other abnormalities of gait and mobility: Secondary | ICD-10-CM | POA: Diagnosis not present

## 2022-04-24 DIAGNOSIS — R2681 Unsteadiness on feet: Secondary | ICD-10-CM | POA: Diagnosis not present

## 2022-04-24 DIAGNOSIS — J449 Chronic obstructive pulmonary disease, unspecified: Secondary | ICD-10-CM | POA: Diagnosis not present

## 2022-04-24 DIAGNOSIS — M6281 Muscle weakness (generalized): Secondary | ICD-10-CM | POA: Diagnosis not present

## 2022-04-25 DIAGNOSIS — R2681 Unsteadiness on feet: Secondary | ICD-10-CM | POA: Diagnosis not present

## 2022-04-25 DIAGNOSIS — J449 Chronic obstructive pulmonary disease, unspecified: Secondary | ICD-10-CM | POA: Diagnosis not present

## 2022-04-25 DIAGNOSIS — N189 Chronic kidney disease, unspecified: Secondary | ICD-10-CM | POA: Diagnosis not present

## 2022-04-25 DIAGNOSIS — M6281 Muscle weakness (generalized): Secondary | ICD-10-CM | POA: Diagnosis not present

## 2022-04-25 DIAGNOSIS — R2689 Other abnormalities of gait and mobility: Secondary | ICD-10-CM | POA: Diagnosis not present

## 2022-04-26 DIAGNOSIS — M6281 Muscle weakness (generalized): Secondary | ICD-10-CM | POA: Diagnosis not present

## 2022-04-26 DIAGNOSIS — R2689 Other abnormalities of gait and mobility: Secondary | ICD-10-CM | POA: Diagnosis not present

## 2022-04-26 DIAGNOSIS — N189 Chronic kidney disease, unspecified: Secondary | ICD-10-CM | POA: Diagnosis not present

## 2022-04-26 DIAGNOSIS — J449 Chronic obstructive pulmonary disease, unspecified: Secondary | ICD-10-CM | POA: Diagnosis not present

## 2022-04-26 DIAGNOSIS — R2681 Unsteadiness on feet: Secondary | ICD-10-CM | POA: Diagnosis not present

## 2022-04-28 DIAGNOSIS — R2681 Unsteadiness on feet: Secondary | ICD-10-CM | POA: Diagnosis not present

## 2022-04-28 DIAGNOSIS — R2689 Other abnormalities of gait and mobility: Secondary | ICD-10-CM | POA: Diagnosis not present

## 2022-04-28 DIAGNOSIS — M6281 Muscle weakness (generalized): Secondary | ICD-10-CM | POA: Diagnosis not present

## 2022-04-28 DIAGNOSIS — J449 Chronic obstructive pulmonary disease, unspecified: Secondary | ICD-10-CM | POA: Diagnosis not present

## 2022-04-28 DIAGNOSIS — N189 Chronic kidney disease, unspecified: Secondary | ICD-10-CM | POA: Diagnosis not present

## 2022-04-29 DIAGNOSIS — R2681 Unsteadiness on feet: Secondary | ICD-10-CM | POA: Diagnosis not present

## 2022-04-29 DIAGNOSIS — M6281 Muscle weakness (generalized): Secondary | ICD-10-CM | POA: Diagnosis not present

## 2022-04-29 DIAGNOSIS — E119 Type 2 diabetes mellitus without complications: Secondary | ICD-10-CM | POA: Diagnosis not present

## 2022-04-29 DIAGNOSIS — N189 Chronic kidney disease, unspecified: Secondary | ICD-10-CM | POA: Diagnosis not present

## 2022-04-29 DIAGNOSIS — J449 Chronic obstructive pulmonary disease, unspecified: Secondary | ICD-10-CM | POA: Diagnosis not present

## 2022-04-29 DIAGNOSIS — R2689 Other abnormalities of gait and mobility: Secondary | ICD-10-CM | POA: Diagnosis not present

## 2022-04-30 DIAGNOSIS — J449 Chronic obstructive pulmonary disease, unspecified: Secondary | ICD-10-CM | POA: Diagnosis not present

## 2022-04-30 DIAGNOSIS — N189 Chronic kidney disease, unspecified: Secondary | ICD-10-CM | POA: Diagnosis not present

## 2022-04-30 DIAGNOSIS — R2681 Unsteadiness on feet: Secondary | ICD-10-CM | POA: Diagnosis not present

## 2022-04-30 DIAGNOSIS — R2689 Other abnormalities of gait and mobility: Secondary | ICD-10-CM | POA: Diagnosis not present

## 2022-04-30 DIAGNOSIS — M6281 Muscle weakness (generalized): Secondary | ICD-10-CM | POA: Diagnosis not present

## 2022-05-01 DIAGNOSIS — R2689 Other abnormalities of gait and mobility: Secondary | ICD-10-CM | POA: Diagnosis not present

## 2022-05-01 DIAGNOSIS — R2681 Unsteadiness on feet: Secondary | ICD-10-CM | POA: Diagnosis not present

## 2022-05-01 DIAGNOSIS — J449 Chronic obstructive pulmonary disease, unspecified: Secondary | ICD-10-CM | POA: Diagnosis not present

## 2022-05-01 DIAGNOSIS — N189 Chronic kidney disease, unspecified: Secondary | ICD-10-CM | POA: Diagnosis not present

## 2022-05-01 DIAGNOSIS — M6281 Muscle weakness (generalized): Secondary | ICD-10-CM | POA: Diagnosis not present

## 2022-05-02 DIAGNOSIS — R2689 Other abnormalities of gait and mobility: Secondary | ICD-10-CM | POA: Diagnosis not present

## 2022-05-02 DIAGNOSIS — R2681 Unsteadiness on feet: Secondary | ICD-10-CM | POA: Diagnosis not present

## 2022-05-02 DIAGNOSIS — N189 Chronic kidney disease, unspecified: Secondary | ICD-10-CM | POA: Diagnosis not present

## 2022-05-02 DIAGNOSIS — J449 Chronic obstructive pulmonary disease, unspecified: Secondary | ICD-10-CM | POA: Diagnosis not present

## 2022-05-02 DIAGNOSIS — M6281 Muscle weakness (generalized): Secondary | ICD-10-CM | POA: Diagnosis not present

## 2022-05-03 DIAGNOSIS — N189 Chronic kidney disease, unspecified: Secondary | ICD-10-CM | POA: Diagnosis not present

## 2022-05-03 DIAGNOSIS — J449 Chronic obstructive pulmonary disease, unspecified: Secondary | ICD-10-CM | POA: Diagnosis not present

## 2022-05-03 DIAGNOSIS — R2689 Other abnormalities of gait and mobility: Secondary | ICD-10-CM | POA: Diagnosis not present

## 2022-05-03 DIAGNOSIS — R2681 Unsteadiness on feet: Secondary | ICD-10-CM | POA: Diagnosis not present

## 2022-05-03 DIAGNOSIS — M6281 Muscle weakness (generalized): Secondary | ICD-10-CM | POA: Diagnosis not present

## 2022-05-23 DIAGNOSIS — E1165 Type 2 diabetes mellitus with hyperglycemia: Secondary | ICD-10-CM | POA: Diagnosis not present

## 2022-05-23 DIAGNOSIS — E1140 Type 2 diabetes mellitus with diabetic neuropathy, unspecified: Secondary | ICD-10-CM | POA: Diagnosis not present

## 2022-05-23 DIAGNOSIS — E1151 Type 2 diabetes mellitus with diabetic peripheral angiopathy without gangrene: Secondary | ICD-10-CM | POA: Diagnosis not present

## 2022-05-23 DIAGNOSIS — E559 Vitamin D deficiency, unspecified: Secondary | ICD-10-CM | POA: Diagnosis not present

## 2022-05-27 DIAGNOSIS — E1165 Type 2 diabetes mellitus with hyperglycemia: Secondary | ICD-10-CM | POA: Diagnosis not present

## 2022-05-28 ENCOUNTER — Emergency Department (HOSPITAL_COMMUNITY): Payer: Medicare Other

## 2022-05-28 ENCOUNTER — Other Ambulatory Visit: Payer: Self-pay

## 2022-05-28 ENCOUNTER — Emergency Department (HOSPITAL_COMMUNITY)
Admission: EM | Admit: 2022-05-28 | Discharge: 2022-05-28 | Disposition: A | Discharge status: Home / Self Care | Payer: Medicare Other | Attending: Emergency Medicine | Admitting: Emergency Medicine

## 2022-05-28 DIAGNOSIS — M25512 Pain in left shoulder: Secondary | ICD-10-CM | POA: Diagnosis present

## 2022-05-28 DIAGNOSIS — Y92002 Bathroom of unspecified non-institutional (private) residence single-family (private) house as the place of occurrence of the external cause: Secondary | ICD-10-CM | POA: Diagnosis not present

## 2022-05-28 DIAGNOSIS — J9811 Atelectasis: Secondary | ICD-10-CM | POA: Diagnosis not present

## 2022-05-28 DIAGNOSIS — W182XXA Fall in (into) shower or empty bathtub, initial encounter: Secondary | ICD-10-CM | POA: Diagnosis not present

## 2022-05-28 DIAGNOSIS — Z743 Need for continuous supervision: Secondary | ICD-10-CM | POA: Diagnosis not present

## 2022-05-28 DIAGNOSIS — S42295A Other nondisplaced fracture of upper end of left humerus, initial encounter for closed fracture: Secondary | ICD-10-CM | POA: Insufficient documentation

## 2022-05-28 DIAGNOSIS — R531 Weakness: Secondary | ICD-10-CM | POA: Diagnosis not present

## 2022-05-28 DIAGNOSIS — Z23 Encounter for immunization: Secondary | ICD-10-CM | POA: Diagnosis not present

## 2022-05-28 DIAGNOSIS — Z043 Encounter for examination and observation following other accident: Secondary | ICD-10-CM | POA: Diagnosis not present

## 2022-05-28 DIAGNOSIS — F0390 Unspecified dementia without behavioral disturbance: Secondary | ICD-10-CM | POA: Diagnosis not present

## 2022-05-28 DIAGNOSIS — S161XXA Strain of muscle, fascia and tendon at neck level, initial encounter: Secondary | ICD-10-CM | POA: Insufficient documentation

## 2022-05-28 DIAGNOSIS — R404 Transient alteration of awareness: Secondary | ICD-10-CM | POA: Diagnosis not present

## 2022-05-28 DIAGNOSIS — S060XAA Concussion with loss of consciousness status unknown, initial encounter: Secondary | ICD-10-CM | POA: Insufficient documentation

## 2022-05-28 DIAGNOSIS — W19XXXA Unspecified fall, initial encounter: Secondary | ICD-10-CM

## 2022-05-28 DIAGNOSIS — R6889 Other general symptoms and signs: Secondary | ICD-10-CM | POA: Diagnosis not present

## 2022-05-28 DIAGNOSIS — Z79899 Other long term (current) drug therapy: Secondary | ICD-10-CM | POA: Diagnosis not present

## 2022-05-28 DIAGNOSIS — S42202A Unspecified fracture of upper end of left humerus, initial encounter for closed fracture: Secondary | ICD-10-CM | POA: Diagnosis not present

## 2022-05-28 DIAGNOSIS — I499 Cardiac arrhythmia, unspecified: Secondary | ICD-10-CM | POA: Diagnosis not present

## 2022-05-28 DIAGNOSIS — S01511A Laceration without foreign body of lip, initial encounter: Secondary | ICD-10-CM | POA: Insufficient documentation

## 2022-05-28 DIAGNOSIS — Z794 Long term (current) use of insulin: Secondary | ICD-10-CM | POA: Insufficient documentation

## 2022-05-28 DIAGNOSIS — R58 Hemorrhage, not elsewhere classified: Secondary | ICD-10-CM | POA: Diagnosis not present

## 2022-05-28 DIAGNOSIS — Z7401 Bed confinement status: Secondary | ICD-10-CM | POA: Diagnosis not present

## 2022-05-28 DIAGNOSIS — S02401A Maxillary fracture, unspecified, initial encounter for closed fracture: Secondary | ICD-10-CM | POA: Insufficient documentation

## 2022-05-28 DIAGNOSIS — I4510 Unspecified right bundle-branch block: Secondary | ICD-10-CM | POA: Diagnosis not present

## 2022-05-28 DIAGNOSIS — S0181XA Laceration without foreign body of other part of head, initial encounter: Secondary | ICD-10-CM

## 2022-05-28 DIAGNOSIS — S0990XA Unspecified injury of head, initial encounter: Secondary | ICD-10-CM | POA: Diagnosis not present

## 2022-05-28 MED ORDER — TETANUS-DIPHTH-ACELL PERTUSSIS 5-2.5-18.5 LF-MCG/0.5 IM SUSY
0.5000 mL | PREFILLED_SYRINGE | Freq: Once | INTRAMUSCULAR | Status: AC
Start: 2022-05-28 — End: 2022-05-28
  Administered 2022-05-28: 0.5 mL via INTRAMUSCULAR
  Filled 2022-05-28: qty 0.5

## 2022-05-28 MED ORDER — LIDOCAINE-EPINEPHRINE-TETRACAINE (LET) TOPICAL GEL
3.0000 mL | Freq: Once | TOPICAL | Status: AC
  Administered 2022-05-28: 3 mL via TOPICAL
  Filled 2022-05-28: qty 3

## 2022-05-28 MED ORDER — LIDOCAINE-EPINEPHRINE (PF) 2 %-1:200000 IJ SOLN
20.0000 mL | Freq: Once | INTRAMUSCULAR | Status: AC
  Administered 2022-05-28: 20 mL
  Filled 2022-05-28: qty 20

## 2022-05-28 NOTE — ED Notes (Addendum)
Attempted to call [facility] to give report, no answer at facility.  [facility] staff notified and patient returning to facility.

## 2022-05-28 NOTE — Progress Notes (Signed)
Chaplain responded to this level II fall on thinners.  Patient arrived from a facility and no family present at this time.  Patient unavailable as she continues to be evaluated.  Chaplain available for support as needed. [place], Mdiv.    05/28/22 0345  Clinical Encounter Type  Visited With Health care provider;Patient not available  Visit Type Initial;Trauma;ED  Referral From Nurse  Consult/Referral To Chaplain

## 2022-05-28 NOTE — ED Notes (Signed)
Patient transported to CT by RN [facility]

## 2022-05-28 NOTE — Discharge Instructions (Addendum)
The stitches inside and outside your lip will dissolve on their own.  Keep them clean and dry.  Try not to get any food caught in your upper lip  You can follow-up with orthopedic doctor in 1 week for your broken humerus

## 2022-05-28 NOTE — ED Notes (Addendum)
Pt arrived via [facility] for level 2 fall on thinners (Plavix). Pt suffered unwitnessed fall in bathroom face forward onto ground. Laceration noted to upper lip, severe tenderness to left shoulder and left hip. Dementia at baseline. A&Ox2, GCS 14.  BP 118/68 SPO2 99% HR 58 RR 18 CBG 233

## 2022-05-28 NOTE — ED Notes (Signed)
[facility] called  

## 2022-05-28 NOTE — ED Notes (Signed)
Called [facility] to transport patient to [facility].

## 2022-05-28 NOTE — ED Notes (Signed)
MD Wickline at bedside for upper lip laceration repair

## 2022-05-28 NOTE — ED Provider Notes (Signed)
[facility] EMERGENCY DEPARTMENT Provider Note   CSN: [id number] Arrival date & time: 05/28/22  6063     History  Chief complaint - fall, facial trauma  LEVEL 5 CAVEAT DUE TO DEMENTIA Yvonne [name] is a 76 y.o. female.  The history is provided by the patient and the EMS personnel. The history is limited by the condition of the patient.   Patient presents as a level 2 trauma due to fall on antiplatelet therapy.  Patient arrives via [facility] after she suffered an unwitnessed fall.  Staff at nursing facility reports they heard her fall in the bathroom.  She was noted to have a laceration of her upper lip and pain in her left shoulder.  Patient has dementia at baseline    Home Medications Prior to Admission medications   Medication Sig Start Date End Date Taking? Authorizing Provider  acetaminophen (TYLENOL) 500 MG tablet Take 500 mg by mouth every 6 (six) hours as needed (pain.).    [provider]  albuterol (PROVENTIL HFA;VENTOLIN HFA) 108 (90 BASE) MCG/ACT inhaler Inhale 2 puffs into the lungs every 6 (six) hours as needed for wheezing. 07/20/15 09/20/21  Tresa Garter, MD  barrier cream (NON-SPECIFIED) CREA Apply 1 application topically. Apply TO BUTTOCKS WITH BATHING AND INCONTINENCE CARE 10/24/20   [provider]  divalproex (DEPAKOTE SPRINKLE) 125 MG capsule Take 125 mg by mouth 2 (two) times daily. 02/11/21   [provider]  divalproex (DEPAKOTE) 125 MG DR tablet Take 125 mg by mouth 2 (two) times daily.    [provider]  docusate sodium (COLACE) 100 MG capsule Take 100 mg by mouth 2 (two) times daily. For Constipation 11/03/20   [provider]  DULoxetine (CYMBALTA) 30 MG capsule Take 30 mg by mouth daily. 10/18/20   [provider]  DULoxetine (CYMBALTA) 60 MG capsule Take 60 mg by mouth daily. For Depression 10/24/20   [provider]  furosemide (LASIX) 40 MG tablet Take 1  tablet (40 mg total) by mouth 2 (two) times daily. 03/29/20   Geradine Girt, DO  gabapentin (NEURONTIN) 100 MG capsule Take 100 mg by mouth 3 (three) times daily. (0900, 1400, & 2100)    [provider]  HYDROcodone-acetaminophen (NORCO/VICODIN) 5-325 MG tablet Take 1 tablet by mouth 3 (three) times daily as needed. 10/10/20   [provider]  insulin glargine (LANTUS) 100 UNIT/ML injection Inject 0.27 mLs (27 Units total) into the skin at bedtime. 11/01/20   Fargo, Amy E, NP  insulin lispro (HUMALOG) 100 UNIT/ML KwikPen Inject into the skin 4 (four) times daily -  before meals and at bedtime. CBG 121-150=1 U, 151-200= 2 U, 201-250= 3 U, 251-300=5 U, 301-350= 7 U, 351-400= 9 U, GREATER THAN 400= 9 U, CALL MD    [provider]  magic mouthwash w/lidocaine SOLN Take 5 mLs by mouth 3 (three) times daily as needed for mouth pain. 03/29/20   Geradine Girt, DO  metoprolol tartrate (LOPRESSOR) 25 MG tablet Take 0.5 tablets (12.5 mg total) by mouth 2 (two) times daily. 03/29/20   Geradine Girt, DO  Multiple Vitamin (MULTIVITAMIN WITH MINERALS) TABS tablet Take 1 tablet by mouth daily. 03/29/20   Geradine Girt, DO  pantoprazole (PROTONIX) 40 MG tablet Take 1 tablet (40 mg total) by mouth every 12 (twelve) hours. 03/29/20   Geradine Girt, DO  polyethylene glycol (MIRALAX / GLYCOLAX) 17 g packet Take 17 g by mouth  daily.    [provider]  pravastatin (PRAVACHOL) 20 MG tablet Take 20 mg by mouth daily.    [provider]      Allergies    Clonazepam and Trazodone and nefazodone    Review of Systems   Review of Systems  Unable to perform ROS: Dementia    Physical Exam Updated Vital Signs BP 127/72 (BP Location: Right Arm)   Pulse 72   Temp (!) 96.6 F (35.9 C) (Temporal)   Resp 14   Ht 1.549 m ('5\' 1"'$ )   Wt 93.4 kg   SpO2 98%   BMI 38.92 kg/m  Physical Exam CONSTITUTIONAL: Elderly, no acute distress HEAD: Normocephalic/atraumatic EYES:  EOMI/PERRL ENMT: Laceration noted to just above the the upper lip that is through and through. Patient is edentulous No other facial tenderness noted, no septal hematoma, midface stable NECK: Cervical collar in place SPINE/BACK: Cervical spine tenderness, no thoracic or lumbar tenderness CV: S1/S2 noted, no murmurs/rubs/gallops noted LUNGS: Lungs are clear to auscultation bilaterally, no apparent distress ABDOMEN: soft, nontender NEURO: Pt is awake/alert moves all extremitiesx4.  No facial droop.   EXTREMITIES: pulses normal/equal, full ROM, mild tenderness to palpation of left shoulder All other extremities/joints palpated/ranged and nontender SKIN: warm, color normal  ED Results / Procedures / Treatments   Labs (all labs ordered are listed, but only abnormal results are displayed) Labs Reviewed - No data to display  EKG  ED ECG REPORT   Date: 05/28/2022 00348  Rate: 56  Rhythm: sinus bradycardia  QRS Axis: left  Intervals: normal  ST/T Wave abnormalities: normal  Conduction Disutrbances:right bundle branch block  Narrative Interpretation:   Old EKG Reviewed: unchanged  I have personally reviewed the EKG tracing and agree with the computerized printout as noted.   Radiology CT Cervical Spine Wo Contrast  Result Date: 05/28/2022 CLINICAL DATA:  76 year old female status post unwitnessed fall. On blood thinners. Lip laceration. EXAM: CT CERVICAL SPINE WITHOUT CONTRAST TECHNIQUE: Multidetector CT imaging of the cervical spine was performed without intravenous contrast. Multiplanar CT image reconstructions were also generated. RADIATION DOSE REDUCTION: This exam was performed according to the departmental dose-optimization program which includes automated exposure control, adjustment of the mA and/or kV according to patient size and/or use of iterative reconstruction technique. COMPARISON:  CT head and face today.  Cervical spine CT 01/21/2004. FINDINGS: Alignment: Improved cervical  lordosis compared to 2005. Cervicothoracic junction alignment is within normal limits. Bilateral posterior element alignment is within normal limits. Skull base and vertebrae: Visualized skull base is intact. No atlanto-occipital dissociation. C1 and C2 are intact and aligned. No acute osseous abnormality identified. Soft tissues and spinal canal: No prevertebral fluid or swelling. No visible canal hematoma. Negative noncontrast visible neck soft tissues; retropharyngeal course of both carotid arteries (normal variant). Disc levels: Chronic left side facet arthropathy at C2-C3. Chronic disc and endplate degeneration at C6-C7. Chronic calcified degenerative ligament flavum hypertrophy C3-C4, C4-C5, C6-C7. Probably mild associated cervical spinal stenosis at C6-C7. Upper chest: Grossly intact visible upper thoracic levels. Chronic or congenital appearing fusion of the anterior left 1st and 2nd ribs. Mild left lung apex pulmonary septal thickening, nonspecific. IMPRESSION: 1. No acute traumatic injury identified in the cervical spine. 2. Chronic cervical spine degeneration with mild spinal stenosis suspected at C6-C7. Electronically Signed   By: Genevie Ann M.D.   On: 05/28/2022 04:47   CT Maxillofacial Wo Contrast  Result Date: 05/28/2022 CLINICAL DATA:  76 year old female status post unwitnessed fall. On blood  thinners. Lip laceration. EXAM: CT MAXILLOFACIAL WITHOUT CONTRAST TECHNIQUE: Multidetector CT imaging of the maxillofacial structures was performed. Multiplanar CT image reconstructions were also generated. RADIATION DOSE REDUCTION: This exam was performed according to the departmental dose-optimization program which includes automated exposure control, adjustment of the mA and/or kV according to patient size and/or use of iterative reconstruction technique. COMPARISON:  Prior head CTs.  No prior face CT. FINDINGS: Osseous: Absent dentition. Mild mandible motion artifact, but the mandible appears intact and  normally located. There is a linear nondisplaced fracture through the anterior maxilla alveolar process underlying the upper lip (series 10, image 49 and series 9, image 19). Maxilla nasal process remain intact. And maxillary sinuses remain intact. No associated zygoma, pterygoid, or nasal bone fracture. Central skull base appears intact. Cervical spine detailed separately today. Orbits: Intact orbital walls. Orbit soft tissues appears symmetric and within normal limits. Previous postoperative changes to both globes. Sinuses: Clear throughout.  Tympanic cavities are clear. Soft tissues: Deep superior lip laceration overlying the anterior maxilla (series 4, image 48) with dressing material. No radiopaque foreign body identified. No other convincing superficial soft tissue injury, and negative noncontrast deep soft tissue spaces of the face (retropharyngeal course of both carotids and motion artifact at the soft palate). Limited intracranial: Reported separately. IMPRESSION: 1. Nondisplaced fracture through the anterior maxillary alveolar process, underlying a deep laceration of the superior lip. Absent dentition. 2. No other acute traumatic injury identified in the Face. Electronically Signed   By: Genevie Ann M.D.   On: 05/28/2022 04:44   CT Head Wo Contrast  Result Date: 05/28/2022 CLINICAL DATA:  76 year old female status post unwitnessed fall. On blood thinners. Lip laceration. EXAM: CT HEAD WITHOUT CONTRAST TECHNIQUE: Contiguous axial images were obtained from the base of the skull through the vertex without intravenous contrast. RADIATION DOSE REDUCTION: This exam was performed according to the departmental dose-optimization program which includes automated exposure control, adjustment of the mA and/or kV according to patient size and/or use of iterative reconstruction technique. COMPARISON:  Face and cervical spine CT today. Prior head CT 02/27/2020. FINDINGS: Brain: Stable cerebral volume. Cavum septum  pellucidum, normal variant. Patchy and confluent bilateral cerebral white matter hypodensity with asymmetric involvement of the bilateral thalami does not appear significantly changed since 2021. Heterogeneity in the brainstem on series 3, image 11 is more conspicuous but might be artifact. No superimposed midline shift, ventriculomegaly, mass effect, evidence of mass lesion, intracranial hemorrhage or evidence of cortically based acute infarction. Vascular: Calcified atherosclerosis at the skull base. No suspicious intracranial vascular hyperdensity. Skull: No fracture identified. Sinuses/Orbits: Visualized paranasal sinuses and mastoids are stable and well aerated. Other: No acute orbit or scalp soft tissue injury identified. IMPRESSION: 1. No acute traumatic injury identified. 2. Advanced chronic small vessel disease, with questionable progression in the brainstem since 2021. Electronically Signed   By: Genevie Ann M.D.   On: 05/28/2022 04:40   DG Pelvis Portable  Result Date: 05/28/2022 CLINICAL DATA:  Status post fall. EXAM: PORTABLE PELVIS 1-2 VIEWS COMPARISON:  February 28, 2020 FINDINGS: There is no evidence of pelvic fracture or diastasis. No pelvic bone lesions are seen. Degenerative changes are seen involving both hips in the form of joint space narrowing and acetabular sclerosis. A radiopaque vascular stent is seen along the medial aspect of the left lower extremity. IMPRESSION: No acute osseous abnormality. Electronically Signed   By: Virgina Norfolk M.D.   On: 05/28/2022 03:51   DG Chest Portable 1 View  Result  Date: 05/28/2022 CLINICAL DATA:  Status post fall. EXAM: PORTABLE CHEST 1 VIEW COMPARISON:  February 27, 2020 FINDINGS: The heart size and mediastinal contours are within normal limits. Low lung volumes are noted. Mild atelectasis is seen within the bilateral lung bases, left slightly greater than right. There is no evidence of a pleural effusion or pneumothorax. An ill-defined cortical lucency is  seen involving the left humeral head. Degenerative changes are also seen involving both shoulders. IMPRESSION: 1. Low lung volumes with mild bibasilar atelectasis. Electronically Signed   By: Virgina Norfolk M.D.   On: 05/28/2022 03:47   DG Shoulder Left Portable  Result Date: 05/28/2022 CLINICAL DATA:  Status post fall. EXAM: LEFT SHOULDER COMPARISON:  August 17, 2018 FINDINGS: Acute nondisplaced fracture deformity is seen extending through the head of the proximal left humerus. There is no evidence of dislocation. Moderate severity degenerative changes are seen involving the glenohumeral articulation. Soft tissues are unremarkable. IMPRESSION: Acute fracture of the proximal left humerus. Electronically Signed   By: Virgina Norfolk M.D.   On: 05/28/2022 03:46    Procedures .Ortho Injury Treatment  Date/Time: 05/28/2022 5:02 AM  Performed by: Ripley Fraise, MD Authorized by: Ripley Fraise, MD   Consent:    Consent obtained:  Verbal   Consent given by:  Patient   Risks discussed:  FractureInjury location: upper arm Location details: left upper arm Injury type: fracture Fracture type: humeral shaft Pre-procedure neurovascular assessment: neurovascularly intact Pre-procedure distal perfusion: normal Pre-procedure neurological function: normal Pre-procedure range of motion: reduced Manipulation performed: no Immobilization: sling Splint Applied by: Sheliah Hatch Post-procedure neurovascular assessment: post-procedure neurovascularly intact Post-procedure distal perfusion: normal Post-procedure neurological function: normal Post-procedure range of motion: unchanged   .Marland KitchenLaceration Repair  Date/Time: 05/28/2022 5:03 AM  Performed by: Ripley Fraise, MD Authorized by: Ripley Fraise, MD   Consent:    Consent obtained:  Verbal   Consent given by:  Patient Universal protocol:    Patient identity confirmed:  Provided demographic data Anesthesia:    Anesthesia method:  Local  infiltration   Local anesthetic:  Lidocaine 2% WITH epi Laceration details:    Location:  Lip   Lip location:  Upper exterior lip   Length (cm):  3 Pre-procedure details:    Preparation:  Patient was prepped and draped in usual sterile fashion Treatment:    Amount of cleaning:  Standard   Irrigation solution:  Sterile saline Skin repair:    Repair method:  Sutures   Suture size:  4-0   Wound skin closure material used: vicryl.   Number of sutures:  4 Approximation:    Approximation:  Close Repair type:    Repair type:  Simple Post-procedure details:    Procedure completion:  Tolerated well, no immediate complications .Marland KitchenLaceration Repair  Date/Time: 05/28/2022 5:03 AM  Performed by: Ripley Fraise, MD Authorized by: Ripley Fraise, MD   Consent:    Consent obtained:  Verbal Universal protocol:    Patient identity confirmed:  Provided demographic data Anesthesia:    Anesthesia method:  Local infiltration   Local anesthetic:  Lidocaine 2% WITH epi Laceration details:    Location:  Lip   Lip location:  Upper interior lip   Length (cm):  2 Treatment:    Amount of cleaning:  Standard   Irrigation solution:  Sterile saline Skin repair:    Repair method:  Sutures   Suture size:  4-0   Wound skin closure material used: vicryl.   Suture technique:  Simple interrupted  Number of sutures:  2 Approximation:    Approximation:  Close Repair type:    Repair type:  Complex Post-procedure details:    Procedure completion:  Tolerated well, no immediate complications     Medications Ordered in ED Medications  lidocaine-EPINEPHrine-tetracaine (LET) topical gel (3 mLs Topical Given 05/28/22 0435)  Tdap (BOOSTRIX) injection 0.5 mL (0.5 mLs Intramuscular Given 05/28/22 0435)  lidocaine-EPINEPHrine (XYLOCAINE W/EPI) 2 %-1:200000 (PF) injection 20 mL (20 mLs Infiltration Given 05/28/22 0445)    ED Course/ Medical Decision Making/ A&P Clinical Course as of 05/28/22 0603  Tue May 28, 2022  0348 I attempted to call nursing facility but there was no answer [DW]  0602 Patient is able to bear weight without any new pain.  She is awake and alert.  She can follow-up as an outpatient for her stable humerus fracture.  Sling has been applied.  Patient is safe for discharge back to facility [DW]    Clinical Course User Index [DW] Ripley Fraise, MD                           Medical Decision Making Amount and/or Complexity of Data Reviewed Radiology: ordered. ECG/medicine tests: ordered.  Risk Prescription drug management.   This patient presents to the ED for concern of fall and head injury, this involves an extensive number of treatment options, and is a complaint that carries with it a high risk of complications and morbidity.  The differential diagnosis includes but is not limited to intracranial hemorrhage, subdural hematoma, skull fracture, cervical spine fracture, orbital fracture, facial fracture  Comorbidities that complicate the patient evaluation: Patient's presentation is complicated by their history of dementia and Plavix use  Social Determinants of Health: Patient's  poor mobility   increases the complexity of managing their presentation  Additional history obtained: Additional history obtained from EMS discussed with paramedic at bedside   Imaging Studies ordered: I ordered imaging studies including CT scan head, C-spine and maxillofacial and X-ray chest x-ray, shoulder x-ray, pelvic x-ray   I independently visualized and interpreted imaging which showed humerus fracture, maxillary fracture I agree with the radiologist interpretation  Reevaluation: After the interventions noted above, I reevaluated the patient and found that they have :improved  Complexity of problems addressed: Patient's presentation is most consistent with  acute presentation with potential threat to life or bodily function  Disposition: After consideration of the diagnostic  results and the patient's response to treatment,  I feel that the patent would benefit from discharge   .           Final Clinical Impression(s) / ED Diagnoses Final diagnoses:  Fall, initial encounter  Concussion with unknown loss of consciousness status, initial encounter  Strain of neck muscle, initial encounter  Facial laceration, initial encounter  Closed fracture of maxilla, unspecified laterality, initial encounter ([place])  Other closed nondisplaced fracture of proximal end of left humerus, initial encounter    Rx / DC Orders ED Discharge Orders     None         Ripley Fraise, MD 05/28/22 [phone]

## 2022-05-28 NOTE — Progress Notes (Signed)
Orthopedic Tech Progress Note Patient Details:  Yvonne [name] 1946/01/02 [id number]  Ortho Devices Type of Ortho Device: Sling immobilizer Ortho Device/Splint Location: lue Ortho Device/Splint Interventions: Ordered, Application, Adjustment   Post Interventions Patient Tolerated: Well Instructions Provided: Care of device, Adjustment of device  Karolee Stamps 05/28/2022, 5:26 AM

## 2022-05-29 DIAGNOSIS — W1830XA Fall on same level, unspecified, initial encounter: Secondary | ICD-10-CM | POA: Diagnosis not present

## 2022-05-29 DIAGNOSIS — M6281 Muscle weakness (generalized): Secondary | ICD-10-CM | POA: Diagnosis not present

## 2022-05-30 DIAGNOSIS — R1312 Dysphagia, oropharyngeal phase: Secondary | ICD-10-CM | POA: Diagnosis not present

## 2022-05-30 DIAGNOSIS — M6281 Muscle weakness (generalized): Secondary | ICD-10-CM | POA: Diagnosis not present

## 2022-05-30 DIAGNOSIS — E1140 Type 2 diabetes mellitus with diabetic neuropathy, unspecified: Secondary | ICD-10-CM | POA: Diagnosis not present

## 2022-05-30 DIAGNOSIS — S42202A Unspecified fracture of upper end of left humerus, initial encounter for closed fracture: Secondary | ICD-10-CM | POA: Diagnosis not present

## 2022-05-30 DIAGNOSIS — S42352A Displaced comminuted fracture of shaft of humerus, left arm, initial encounter for closed fracture: Secondary | ICD-10-CM | POA: Diagnosis not present

## 2022-05-31 DIAGNOSIS — S42292A Other displaced fracture of upper end of left humerus, initial encounter for closed fracture: Secondary | ICD-10-CM | POA: Diagnosis not present

## 2022-06-03 DIAGNOSIS — S42352A Displaced comminuted fracture of shaft of humerus, left arm, initial encounter for closed fracture: Secondary | ICD-10-CM | POA: Diagnosis not present

## 2022-06-03 DIAGNOSIS — R1312 Dysphagia, oropharyngeal phase: Secondary | ICD-10-CM | POA: Diagnosis not present

## 2022-06-03 DIAGNOSIS — M6281 Muscle weakness (generalized): Secondary | ICD-10-CM | POA: Diagnosis not present

## 2022-06-03 DIAGNOSIS — S42202A Unspecified fracture of upper end of left humerus, initial encounter for closed fracture: Secondary | ICD-10-CM | POA: Diagnosis not present

## 2022-06-03 DIAGNOSIS — E1140 Type 2 diabetes mellitus with diabetic neuropathy, unspecified: Secondary | ICD-10-CM | POA: Diagnosis not present

## 2022-06-04 DIAGNOSIS — S42202A Unspecified fracture of upper end of left humerus, initial encounter for closed fracture: Secondary | ICD-10-CM | POA: Diagnosis not present

## 2022-06-04 DIAGNOSIS — S42352A Displaced comminuted fracture of shaft of humerus, left arm, initial encounter for closed fracture: Secondary | ICD-10-CM | POA: Diagnosis not present

## 2022-06-04 DIAGNOSIS — M6281 Muscle weakness (generalized): Secondary | ICD-10-CM | POA: Diagnosis not present

## 2022-06-04 DIAGNOSIS — E559 Vitamin D deficiency, unspecified: Secondary | ICD-10-CM | POA: Diagnosis not present

## 2022-06-04 DIAGNOSIS — E1165 Type 2 diabetes mellitus with hyperglycemia: Secondary | ICD-10-CM | POA: Diagnosis not present

## 2022-06-04 DIAGNOSIS — R1312 Dysphagia, oropharyngeal phase: Secondary | ICD-10-CM | POA: Diagnosis not present

## 2022-06-04 DIAGNOSIS — E1151 Type 2 diabetes mellitus with diabetic peripheral angiopathy without gangrene: Secondary | ICD-10-CM | POA: Diagnosis not present

## 2022-06-04 DIAGNOSIS — E1140 Type 2 diabetes mellitus with diabetic neuropathy, unspecified: Secondary | ICD-10-CM | POA: Diagnosis not present

## 2022-06-05 DIAGNOSIS — R1312 Dysphagia, oropharyngeal phase: Secondary | ICD-10-CM | POA: Diagnosis not present

## 2022-06-05 DIAGNOSIS — M6281 Muscle weakness (generalized): Secondary | ICD-10-CM | POA: Diagnosis not present

## 2022-06-05 DIAGNOSIS — S42352A Displaced comminuted fracture of shaft of humerus, left arm, initial encounter for closed fracture: Secondary | ICD-10-CM | POA: Diagnosis not present

## 2022-06-05 DIAGNOSIS — S42202A Unspecified fracture of upper end of left humerus, initial encounter for closed fracture: Secondary | ICD-10-CM | POA: Diagnosis not present

## 2022-06-05 DIAGNOSIS — E1140 Type 2 diabetes mellitus with diabetic neuropathy, unspecified: Secondary | ICD-10-CM | POA: Diagnosis not present

## 2022-06-06 DIAGNOSIS — S42202A Unspecified fracture of upper end of left humerus, initial encounter for closed fracture: Secondary | ICD-10-CM | POA: Diagnosis not present

## 2022-06-06 DIAGNOSIS — R1312 Dysphagia, oropharyngeal phase: Secondary | ICD-10-CM | POA: Diagnosis not present

## 2022-06-06 DIAGNOSIS — S42352A Displaced comminuted fracture of shaft of humerus, left arm, initial encounter for closed fracture: Secondary | ICD-10-CM | POA: Diagnosis not present

## 2022-06-06 DIAGNOSIS — M6281 Muscle weakness (generalized): Secondary | ICD-10-CM | POA: Diagnosis not present

## 2022-06-06 DIAGNOSIS — E1140 Type 2 diabetes mellitus with diabetic neuropathy, unspecified: Secondary | ICD-10-CM | POA: Diagnosis not present

## 2022-06-10 DIAGNOSIS — S42202A Unspecified fracture of upper end of left humerus, initial encounter for closed fracture: Secondary | ICD-10-CM | POA: Diagnosis not present

## 2022-06-10 DIAGNOSIS — R1312 Dysphagia, oropharyngeal phase: Secondary | ICD-10-CM | POA: Diagnosis not present

## 2022-06-10 DIAGNOSIS — S42352A Displaced comminuted fracture of shaft of humerus, left arm, initial encounter for closed fracture: Secondary | ICD-10-CM | POA: Diagnosis not present

## 2022-06-10 DIAGNOSIS — M6281 Muscle weakness (generalized): Secondary | ICD-10-CM | POA: Diagnosis not present

## 2022-06-10 DIAGNOSIS — E1140 Type 2 diabetes mellitus with diabetic neuropathy, unspecified: Secondary | ICD-10-CM | POA: Diagnosis not present

## 2022-06-11 DIAGNOSIS — S42352A Displaced comminuted fracture of shaft of humerus, left arm, initial encounter for closed fracture: Secondary | ICD-10-CM | POA: Diagnosis not present

## 2022-06-11 DIAGNOSIS — S42202A Unspecified fracture of upper end of left humerus, initial encounter for closed fracture: Secondary | ICD-10-CM | POA: Diagnosis not present

## 2022-06-11 DIAGNOSIS — R1312 Dysphagia, oropharyngeal phase: Secondary | ICD-10-CM | POA: Diagnosis not present

## 2022-06-11 DIAGNOSIS — M6281 Muscle weakness (generalized): Secondary | ICD-10-CM | POA: Diagnosis not present

## 2022-06-11 DIAGNOSIS — E1140 Type 2 diabetes mellitus with diabetic neuropathy, unspecified: Secondary | ICD-10-CM | POA: Diagnosis not present

## 2022-06-12 DIAGNOSIS — S42352A Displaced comminuted fracture of shaft of humerus, left arm, initial encounter for closed fracture: Secondary | ICD-10-CM | POA: Diagnosis not present

## 2022-06-12 DIAGNOSIS — S42202A Unspecified fracture of upper end of left humerus, initial encounter for closed fracture: Secondary | ICD-10-CM | POA: Diagnosis not present

## 2022-06-12 DIAGNOSIS — M6281 Muscle weakness (generalized): Secondary | ICD-10-CM | POA: Diagnosis not present

## 2022-06-12 DIAGNOSIS — R1312 Dysphagia, oropharyngeal phase: Secondary | ICD-10-CM | POA: Diagnosis not present

## 2022-06-12 DIAGNOSIS — E1140 Type 2 diabetes mellitus with diabetic neuropathy, unspecified: Secondary | ICD-10-CM | POA: Diagnosis not present

## 2022-06-13 DIAGNOSIS — M6281 Muscle weakness (generalized): Secondary | ICD-10-CM | POA: Diagnosis not present

## 2022-06-13 DIAGNOSIS — R1312 Dysphagia, oropharyngeal phase: Secondary | ICD-10-CM | POA: Diagnosis not present

## 2022-06-13 DIAGNOSIS — S42202A Unspecified fracture of upper end of left humerus, initial encounter for closed fracture: Secondary | ICD-10-CM | POA: Diagnosis not present

## 2022-06-13 DIAGNOSIS — S42352A Displaced comminuted fracture of shaft of humerus, left arm, initial encounter for closed fracture: Secondary | ICD-10-CM | POA: Diagnosis not present

## 2022-06-13 DIAGNOSIS — E1140 Type 2 diabetes mellitus with diabetic neuropathy, unspecified: Secondary | ICD-10-CM | POA: Diagnosis not present

## 2022-06-14 DIAGNOSIS — S42352A Displaced comminuted fracture of shaft of humerus, left arm, initial encounter for closed fracture: Secondary | ICD-10-CM | POA: Diagnosis not present

## 2022-06-14 DIAGNOSIS — M6281 Muscle weakness (generalized): Secondary | ICD-10-CM | POA: Diagnosis not present

## 2022-06-14 DIAGNOSIS — S42202A Unspecified fracture of upper end of left humerus, initial encounter for closed fracture: Secondary | ICD-10-CM | POA: Diagnosis not present

## 2022-06-14 DIAGNOSIS — E1140 Type 2 diabetes mellitus with diabetic neuropathy, unspecified: Secondary | ICD-10-CM | POA: Diagnosis not present

## 2022-06-14 DIAGNOSIS — R1312 Dysphagia, oropharyngeal phase: Secondary | ICD-10-CM | POA: Diagnosis not present

## 2022-06-15 DIAGNOSIS — R112 Nausea with vomiting, unspecified: Secondary | ICD-10-CM | POA: Diagnosis not present

## 2022-06-16 DIAGNOSIS — E1140 Type 2 diabetes mellitus with diabetic neuropathy, unspecified: Secondary | ICD-10-CM | POA: Diagnosis not present

## 2022-06-16 DIAGNOSIS — R1312 Dysphagia, oropharyngeal phase: Secondary | ICD-10-CM | POA: Diagnosis not present

## 2022-06-16 DIAGNOSIS — S42352A Displaced comminuted fracture of shaft of humerus, left arm, initial encounter for closed fracture: Secondary | ICD-10-CM | POA: Diagnosis not present

## 2022-06-16 DIAGNOSIS — M6281 Muscle weakness (generalized): Secondary | ICD-10-CM | POA: Diagnosis not present

## 2022-06-16 DIAGNOSIS — S42202A Unspecified fracture of upper end of left humerus, initial encounter for closed fracture: Secondary | ICD-10-CM | POA: Diagnosis not present

## 2022-06-17 DIAGNOSIS — N179 Acute kidney failure, unspecified: Secondary | ICD-10-CM | POA: Diagnosis not present

## 2022-06-17 DIAGNOSIS — S42352A Displaced comminuted fracture of shaft of humerus, left arm, initial encounter for closed fracture: Secondary | ICD-10-CM | POA: Diagnosis not present

## 2022-06-17 DIAGNOSIS — S42202A Unspecified fracture of upper end of left humerus, initial encounter for closed fracture: Secondary | ICD-10-CM | POA: Diagnosis not present

## 2022-06-17 DIAGNOSIS — R1312 Dysphagia, oropharyngeal phase: Secondary | ICD-10-CM | POA: Diagnosis not present

## 2022-06-17 DIAGNOSIS — E1140 Type 2 diabetes mellitus with diabetic neuropathy, unspecified: Secondary | ICD-10-CM | POA: Diagnosis not present

## 2022-06-17 DIAGNOSIS — M6281 Muscle weakness (generalized): Secondary | ICD-10-CM | POA: Diagnosis not present

## 2022-06-17 DIAGNOSIS — R112 Nausea with vomiting, unspecified: Secondary | ICD-10-CM | POA: Diagnosis not present

## 2022-06-17 DIAGNOSIS — E43 Unspecified severe protein-calorie malnutrition: Secondary | ICD-10-CM | POA: Diagnosis not present

## 2022-06-18 DIAGNOSIS — E1140 Type 2 diabetes mellitus with diabetic neuropathy, unspecified: Secondary | ICD-10-CM | POA: Diagnosis not present

## 2022-06-18 DIAGNOSIS — L603 Nail dystrophy: Secondary | ICD-10-CM | POA: Diagnosis not present

## 2022-06-18 DIAGNOSIS — S42352A Displaced comminuted fracture of shaft of humerus, left arm, initial encounter for closed fracture: Secondary | ICD-10-CM | POA: Diagnosis not present

## 2022-06-18 DIAGNOSIS — M6281 Muscle weakness (generalized): Secondary | ICD-10-CM | POA: Diagnosis not present

## 2022-06-18 DIAGNOSIS — R1312 Dysphagia, oropharyngeal phase: Secondary | ICD-10-CM | POA: Diagnosis not present

## 2022-06-18 DIAGNOSIS — S42202A Unspecified fracture of upper end of left humerus, initial encounter for closed fracture: Secondary | ICD-10-CM | POA: Diagnosis not present

## 2022-06-18 DIAGNOSIS — E1051 Type 1 diabetes mellitus with diabetic peripheral angiopathy without gangrene: Secondary | ICD-10-CM | POA: Diagnosis not present

## 2022-06-19 DIAGNOSIS — R1312 Dysphagia, oropharyngeal phase: Secondary | ICD-10-CM | POA: Diagnosis not present

## 2022-06-19 DIAGNOSIS — M6281 Muscle weakness (generalized): Secondary | ICD-10-CM | POA: Diagnosis not present

## 2022-06-19 DIAGNOSIS — E1140 Type 2 diabetes mellitus with diabetic neuropathy, unspecified: Secondary | ICD-10-CM | POA: Diagnosis not present

## 2022-06-19 DIAGNOSIS — S42202A Unspecified fracture of upper end of left humerus, initial encounter for closed fracture: Secondary | ICD-10-CM | POA: Diagnosis not present

## 2022-06-19 DIAGNOSIS — S42352A Displaced comminuted fracture of shaft of humerus, left arm, initial encounter for closed fracture: Secondary | ICD-10-CM | POA: Diagnosis not present

## 2022-06-20 DIAGNOSIS — S42202A Unspecified fracture of upper end of left humerus, initial encounter for closed fracture: Secondary | ICD-10-CM | POA: Diagnosis not present

## 2022-06-20 DIAGNOSIS — M6281 Muscle weakness (generalized): Secondary | ICD-10-CM | POA: Diagnosis not present

## 2022-06-20 DIAGNOSIS — E1140 Type 2 diabetes mellitus with diabetic neuropathy, unspecified: Secondary | ICD-10-CM | POA: Diagnosis not present

## 2022-06-20 DIAGNOSIS — R1312 Dysphagia, oropharyngeal phase: Secondary | ICD-10-CM | POA: Diagnosis not present

## 2022-06-20 DIAGNOSIS — S42352A Displaced comminuted fracture of shaft of humerus, left arm, initial encounter for closed fracture: Secondary | ICD-10-CM | POA: Diagnosis not present

## 2022-06-21 DIAGNOSIS — R109 Unspecified abdominal pain: Secondary | ICD-10-CM | POA: Diagnosis not present

## 2022-06-21 DIAGNOSIS — E1140 Type 2 diabetes mellitus with diabetic neuropathy, unspecified: Secondary | ICD-10-CM | POA: Diagnosis not present

## 2022-06-21 DIAGNOSIS — K5909 Other constipation: Secondary | ICD-10-CM | POA: Diagnosis not present

## 2022-06-21 DIAGNOSIS — S42352A Displaced comminuted fracture of shaft of humerus, left arm, initial encounter for closed fracture: Secondary | ICD-10-CM | POA: Diagnosis not present

## 2022-06-21 DIAGNOSIS — R112 Nausea with vomiting, unspecified: Secondary | ICD-10-CM | POA: Diagnosis not present

## 2022-06-21 DIAGNOSIS — R1312 Dysphagia, oropharyngeal phase: Secondary | ICD-10-CM | POA: Diagnosis not present

## 2022-06-21 DIAGNOSIS — S42202A Unspecified fracture of upper end of left humerus, initial encounter for closed fracture: Secondary | ICD-10-CM | POA: Diagnosis not present

## 2022-06-21 DIAGNOSIS — M6281 Muscle weakness (generalized): Secondary | ICD-10-CM | POA: Diagnosis not present

## 2022-06-24 DIAGNOSIS — I10 Essential (primary) hypertension: Secondary | ICD-10-CM | POA: Diagnosis not present

## 2022-06-24 DIAGNOSIS — M6281 Muscle weakness (generalized): Secondary | ICD-10-CM | POA: Diagnosis not present

## 2022-06-24 DIAGNOSIS — S42352A Displaced comminuted fracture of shaft of humerus, left arm, initial encounter for closed fracture: Secondary | ICD-10-CM | POA: Diagnosis not present

## 2022-06-24 DIAGNOSIS — R1312 Dysphagia, oropharyngeal phase: Secondary | ICD-10-CM | POA: Diagnosis not present

## 2022-06-24 DIAGNOSIS — R112 Nausea with vomiting, unspecified: Secondary | ICD-10-CM | POA: Diagnosis not present

## 2022-06-24 DIAGNOSIS — N179 Acute kidney failure, unspecified: Secondary | ICD-10-CM | POA: Diagnosis not present

## 2022-06-24 DIAGNOSIS — E1140 Type 2 diabetes mellitus with diabetic neuropathy, unspecified: Secondary | ICD-10-CM | POA: Diagnosis not present

## 2022-06-24 DIAGNOSIS — S42202A Unspecified fracture of upper end of left humerus, initial encounter for closed fracture: Secondary | ICD-10-CM | POA: Diagnosis not present

## 2022-06-26 DIAGNOSIS — M6281 Muscle weakness (generalized): Secondary | ICD-10-CM | POA: Diagnosis not present

## 2022-06-26 DIAGNOSIS — N390 Urinary tract infection, site not specified: Secondary | ICD-10-CM | POA: Diagnosis not present

## 2022-06-26 DIAGNOSIS — S42352A Displaced comminuted fracture of shaft of humerus, left arm, initial encounter for closed fracture: Secondary | ICD-10-CM | POA: Diagnosis not present

## 2022-06-27 DIAGNOSIS — M6281 Muscle weakness (generalized): Secondary | ICD-10-CM | POA: Diagnosis not present

## 2022-06-27 DIAGNOSIS — S42352A Displaced comminuted fracture of shaft of humerus, left arm, initial encounter for closed fracture: Secondary | ICD-10-CM | POA: Diagnosis not present

## 2022-06-28 DIAGNOSIS — N390 Urinary tract infection, site not specified: Secondary | ICD-10-CM | POA: Diagnosis not present

## 2022-07-03 DIAGNOSIS — S42352A Displaced comminuted fracture of shaft of humerus, left arm, initial encounter for closed fracture: Secondary | ICD-10-CM | POA: Diagnosis not present

## 2022-07-03 DIAGNOSIS — M6281 Muscle weakness (generalized): Secondary | ICD-10-CM | POA: Diagnosis not present

## 2022-07-04 DIAGNOSIS — S42352A Displaced comminuted fracture of shaft of humerus, left arm, initial encounter for closed fracture: Secondary | ICD-10-CM | POA: Diagnosis not present

## 2022-07-04 DIAGNOSIS — M6281 Muscle weakness (generalized): Secondary | ICD-10-CM | POA: Diagnosis not present

## 2022-07-06 DIAGNOSIS — M6281 Muscle weakness (generalized): Secondary | ICD-10-CM | POA: Diagnosis not present

## 2022-07-06 DIAGNOSIS — S42352A Displaced comminuted fracture of shaft of humerus, left arm, initial encounter for closed fracture: Secondary | ICD-10-CM | POA: Diagnosis not present

## 2022-07-08 DIAGNOSIS — M6281 Muscle weakness (generalized): Secondary | ICD-10-CM | POA: Diagnosis not present

## 2022-07-08 DIAGNOSIS — S42352A Displaced comminuted fracture of shaft of humerus, left arm, initial encounter for closed fracture: Secondary | ICD-10-CM | POA: Diagnosis not present

## 2022-07-10 DIAGNOSIS — S42352A Displaced comminuted fracture of shaft of humerus, left arm, initial encounter for closed fracture: Secondary | ICD-10-CM | POA: Diagnosis not present

## 2022-07-10 DIAGNOSIS — M6281 Muscle weakness (generalized): Secondary | ICD-10-CM | POA: Diagnosis not present

## 2022-07-11 DIAGNOSIS — M6281 Muscle weakness (generalized): Secondary | ICD-10-CM | POA: Diagnosis not present

## 2022-07-11 DIAGNOSIS — S42352A Displaced comminuted fracture of shaft of humerus, left arm, initial encounter for closed fracture: Secondary | ICD-10-CM | POA: Diagnosis not present

## 2022-07-12 DIAGNOSIS — S42352A Displaced comminuted fracture of shaft of humerus, left arm, initial encounter for closed fracture: Secondary | ICD-10-CM | POA: Diagnosis not present

## 2022-07-12 DIAGNOSIS — M6281 Muscle weakness (generalized): Secondary | ICD-10-CM | POA: Diagnosis not present

## 2022-07-14 ENCOUNTER — Emergency Department (HOSPITAL_COMMUNITY)
Admission: EM | Admit: 2022-07-14 | Discharge: 2022-07-14 | Disposition: A | Discharge status: Home / Self Care | Payer: Medicare Other | Attending: Emergency Medicine | Admitting: Emergency Medicine

## 2022-07-14 ENCOUNTER — Encounter (HOSPITAL_COMMUNITY): Payer: Self-pay

## 2022-07-14 ENCOUNTER — Other Ambulatory Visit: Payer: Self-pay

## 2022-07-14 DIAGNOSIS — R410 Disorientation, unspecified: Secondary | ICD-10-CM | POA: Diagnosis not present

## 2022-07-14 DIAGNOSIS — Z7401 Bed confinement status: Secondary | ICD-10-CM | POA: Diagnosis not present

## 2022-07-14 DIAGNOSIS — R112 Nausea with vomiting, unspecified: Secondary | ICD-10-CM | POA: Insufficient documentation

## 2022-07-14 DIAGNOSIS — Z79899 Other long term (current) drug therapy: Secondary | ICD-10-CM | POA: Insufficient documentation

## 2022-07-14 DIAGNOSIS — R6889 Other general symptoms and signs: Secondary | ICD-10-CM | POA: Diagnosis not present

## 2022-07-14 DIAGNOSIS — R231 Pallor: Secondary | ICD-10-CM | POA: Diagnosis not present

## 2022-07-14 DIAGNOSIS — R0689 Other abnormalities of breathing: Secondary | ICD-10-CM | POA: Diagnosis not present

## 2022-07-14 DIAGNOSIS — Z743 Need for continuous supervision: Secondary | ICD-10-CM | POA: Diagnosis not present

## 2022-07-14 LAB — CBC WITH DIFFERENTIAL/PLATELET
Abs Immature Granulocytes: 0.05 10*3/uL (ref 0.00–0.07)
Basophils Absolute: 0 10*3/uL (ref 0.0–0.1)
Basophils Relative: 0 %
Eosinophils Absolute: 0.1 10*3/uL (ref 0.0–0.5)
Eosinophils Relative: 1 %
HCT: 41.4 % (ref 36.0–46.0)
Hemoglobin: 13.2 g/dL (ref 12.0–15.0)
Immature Granulocytes: 0 %
Lymphocytes Relative: 16 %
Lymphs Abs: 2.2 10*3/uL (ref 0.7–4.0)
MCH: 23.6 pg — ABNORMAL LOW (ref 26.0–34.0)
MCHC: 31.9 g/dL (ref 30.0–36.0)
MCV: 74.1 fL — ABNORMAL LOW (ref 80.0–100.0)
Monocytes Absolute: 0.6 10*3/uL (ref 0.1–1.0)
Monocytes Relative: 4 %
Neutro Abs: 10.7 10*3/uL — ABNORMAL HIGH (ref 1.7–7.7)
Neutrophils Relative %: 79 %
Platelets: 396 10*3/uL (ref 150–400)
RBC: 5.59 MIL/uL — ABNORMAL HIGH (ref 3.87–5.11)
RDW: 14.2 % (ref 11.5–15.5)
WBC: 13.6 10*3/uL — ABNORMAL HIGH (ref 4.0–10.5)
nRBC: 0 % (ref 0.0–0.2)

## 2022-07-14 LAB — COMPREHENSIVE METABOLIC PANEL
ALT: 9 U/L (ref 0–44)
AST: 18 U/L (ref 15–41)
Albumin: 3.4 g/dL — ABNORMAL LOW (ref 3.5–5.0)
Alkaline Phosphatase: 66 U/L (ref 38–126)
Anion gap: 14 (ref 5–15)
BUN: 17 mg/dL (ref 8–23)
CO2: 23 mmol/L (ref 22–32)
Calcium: 9.9 mg/dL (ref 8.9–10.3)
Chloride: 98 mmol/L (ref 98–111)
Creatinine, Ser: 1.88 mg/dL — ABNORMAL HIGH (ref 0.44–1.00)
GFR, Estimated: 27 mL/min — ABNORMAL LOW (ref >60)
Glucose, Bld: 130 mg/dL — ABNORMAL HIGH (ref 70–99)
Potassium: 3.7 mmol/L (ref 3.5–5.1)
Sodium: 135 mmol/L (ref 135–145)
Total Bilirubin: 0.7 mg/dL (ref 0.3–1.2)
Total Protein: 6.1 g/dL — ABNORMAL LOW (ref 6.5–8.1)

## 2022-07-14 LAB — CBG MONITORING, ED: Glucose-Capillary: 137 mg/dL — ABNORMAL HIGH (ref 70–99)

## 2022-07-14 LAB — LIPASE, BLOOD: Lipase: 27 U/L (ref 11–51)

## 2022-07-14 MED ORDER — ONDANSETRON HCL 4 MG/2ML IJ SOLN
4.0000 mg | Freq: Once | INTRAMUSCULAR | Status: AC
  Administered 2022-07-14: 4 mg via INTRAVENOUS
  Filled 2022-07-14: qty 2

## 2022-07-14 MED ORDER — SODIUM CHLORIDE 0.9 % IV BOLUS
1000.0000 mL | Freq: Once | INTRAVENOUS | Status: AC
  Administered 2022-07-14: 1000 mL via INTRAVENOUS

## 2022-07-14 MED ORDER — ONDANSETRON 4 MG PO TBDP: ORAL_TABLET | ORAL | 0 refills | Status: AC

## 2022-07-14 NOTE — ED Notes (Signed)
Refused to have labs drawn x2

## 2022-07-14 NOTE — ED Notes (Signed)
Pt provided with water and crackers at bedside

## 2022-07-14 NOTE — ED Provider Notes (Signed)
[facility] EMERGENCY DEPARTMENT Provider Note   CSN: [id number] Arrival date & time: 07/14/22  1030     History  No chief complaint on file.   Yvonne [name] is a 76 y.o. female.  76 yo F with a chief complaints of nausea and vomiting.  This been reportedly going on for couple weeks.  She is currently in rehab after being found to have a urinary tract infection.  The patient has a history of dementia.  She denies any complaints.  Denies pain anywhere.  Denies abdominal pain.  She does endorse that she has been vomiting but denies vomiting up any blood.  She denies diarrhea.        Home Medications Prior to Admission medications   Medication Sig Start Date End Date Taking? Authorizing Provider  ondansetron (ZOFRAN-ODT) 4 MG disintegrating tablet '4mg'$  ODT q4 hours prn nausea/vomit 07/14/22  Yes Deno Etienne, DO  acetaminophen (TYLENOL) 500 MG tablet Take 500 mg by mouth every 6 (six) hours as needed (pain.).    [provider]  albuterol (PROVENTIL HFA;VENTOLIN HFA) 108 (90 BASE) MCG/ACT inhaler Inhale 2 puffs into the lungs every 6 (six) hours as needed for wheezing. 07/20/15 09/20/21  Tresa Garter, MD  barrier cream (NON-SPECIFIED) CREA Apply 1 application topically. Apply TO BUTTOCKS WITH BATHING AND INCONTINENCE CARE 10/24/20   [provider]  divalproex (DEPAKOTE SPRINKLE) 125 MG capsule Take 125 mg by mouth 2 (two) times daily. 02/11/21   [provider]  divalproex (DEPAKOTE) 125 MG DR tablet Take 125 mg by mouth 2 (two) times daily.    [provider]  docusate sodium (COLACE) 100 MG capsule Take 100 mg by mouth 2 (two) times daily. For Constipation 11/03/20   [provider]  DULoxetine (CYMBALTA) 30 MG capsule Take 30 mg by mouth daily. 10/18/20   [provider]  DULoxetine (CYMBALTA) 60 MG capsule Take 60 mg by mouth daily. For Depression 10/24/20   [provider]  furosemide (LASIX) 40  MG tablet Take 1 tablet (40 mg total) by mouth 2 (two) times daily. 03/29/20   Geradine Girt, DO  gabapentin (NEURONTIN) 100 MG capsule Take 100 mg by mouth 3 (three) times daily. (0900, 1400, & 2100)    [provider]  HYDROcodone-acetaminophen (NORCO/VICODIN) 5-325 MG tablet Take 1 tablet by mouth 3 (three) times daily as needed. 10/10/20   [provider]  insulin glargine (LANTUS) 100 UNIT/ML injection Inject 0.27 mLs (27 Units total) into the skin at bedtime. 11/01/20   Fargo, Amy E, NP  insulin lispro (HUMALOG) 100 UNIT/ML KwikPen Inject into the skin 4 (four) times daily -  before meals and at bedtime. CBG 121-150=1 U, 151-200= 2 U, 201-250= 3 U, 251-300=5 U, 301-350= 7 U, 351-400= 9 U, GREATER THAN 400= 9 U, CALL MD    [provider]  magic mouthwash w/lidocaine SOLN Take 5 mLs by mouth 3 (three) times daily as needed for mouth pain. 03/29/20   Geradine Girt, DO  metoprolol tartrate (LOPRESSOR) 25 MG tablet Take 0.5 tablets (12.5 mg total) by mouth 2 (two) times daily. 03/29/20   Geradine Girt, DO  Multiple Vitamin (MULTIVITAMIN WITH MINERALS) TABS tablet Take 1 tablet by mouth daily. 03/29/20   Geradine Girt, DO  pantoprazole (PROTONIX) 40 MG tablet Take 1 tablet (40 mg total) by mouth every 12 (twelve) hours. 03/29/20   Geradine Girt, DO  polyethylene glycol (MIRALAX / GLYCOLAX) 17 g  packet Take 17 g by mouth daily.    [provider]  pravastatin (PRAVACHOL) 20 MG tablet Take 20 mg by mouth daily.    [provider]      Allergies    Clonazepam and Trazodone and nefazodone    Review of Systems   Review of Systems  Physical Exam Updated Vital Signs BP 109/65   Pulse 82   Temp 98.8 F (37.1 C) (Oral)   Resp 16   SpO2 91%  Physical Exam Vitals and nursing note reviewed.  Constitutional:      General: She is not in acute distress.    Appearance: She is well-developed. She is not diaphoretic.  HENT:     Head: Normocephalic and  atraumatic.  Eyes:     Pupils: Pupils are equal, round, and reactive to light.  Cardiovascular:     Rate and Rhythm: Normal rate and regular rhythm.     Heart sounds: No murmur heard.    No friction rub. No gallop.  Pulmonary:     Effort: Pulmonary effort is normal.     Breath sounds: No wheezing or rales.  Abdominal:     General: There is no distension.     Palpations: Abdomen is soft.     Tenderness: There is no abdominal tenderness.     Comments: No appreciable tenderness on deep palpation of the abdomen in all 4 quadrants  Musculoskeletal:        General: No tenderness.     Cervical back: Normal range of motion and neck supple.  Skin:    General: Skin is warm and dry.  Neurological:     Mental Status: She is alert.  Psychiatric:        Behavior: Behavior normal.     ED Results / Procedures / Treatments   Labs (all labs ordered are listed, but only abnormal results are displayed) Labs Reviewed  CBC WITH DIFFERENTIAL/PLATELET - Abnormal; Notable for the following components:      Result Value   WBC 13.6 (*)    RBC 5.59 (*)    MCV 74.1 (*)    MCH 23.6 (*)    Neutro Abs 10.7 (*)    All other components within normal limits  COMPREHENSIVE METABOLIC PANEL - Abnormal; Notable for the following components:   Glucose, Bld 130 (*)    Creatinine, Ser 1.88 (*)    Total Protein 6.1 (*)    Albumin 3.4 (*)    GFR, Estimated 27 (*)    All other components within normal limits  CBG MONITORING, ED - Abnormal; Notable for the following components:   Glucose-Capillary 137 (*)    All other components within normal limits  LIPASE, BLOOD  COMPREHENSIVE METABOLIC PANEL    EKG None  Radiology No results found.  Procedures Procedures    Medications Ordered in ED Medications  sodium chloride 0.9 % bolus 1,000 mL (1,000 mLs Intravenous New Bag/Given 07/14/22 1237)  ondansetron (ZOFRAN) injection 4 mg (4 mg Intravenous Given 07/14/22 1236)    ED Course/ Medical Decision  Making/ A&P                           Medical Decision Making Amount and/or Complexity of Data Reviewed Labs: ordered.  Risk Prescription drug management.   76 yo F with a chief complaints of nausea and vomiting.  Was sent by the nursing home with concern for hematemesis.  Patient denies any complaints.  Is  a benign abdominal exam.  Initially was refusing blood work but was amenable when I asked her if we could obtain lab work.  We will attempt to draw labs bolus of IV fluids antiemetics reassess.  Patient with a modest bump in her renal function.  No significant electrolyte abnormality no change to her baseline anemia.  She was able to drink a bit for me here.  Tells me that she does not feel like eating any crackers.  Will prescribe nausea medicine.  Have her go back to her skilled nursing facility.  1:41 PM:  I have discussed the diagnosis/risks/treatment options with the patient.  Evaluation and diagnostic testing in the emergency department does not suggest an emergent condition requiring admission or immediate intervention beyond what has been performed at this time.  They will follow up with  PCP. We also discussed returning to the ED immediately if new or worsening sx occur. We discussed the sx which are most concerning (e.g., sudden worsening pain, fever, inability to tolerate by mouth) that necessitate immediate return. Medications administered to the patient during their visit and any new prescriptions provided to the patient are listed below.  Medications given during this visit Medications  sodium chloride 0.9 % bolus 1,000 mL (1,000 mLs Intravenous New Bag/Given 07/14/22 1237)  ondansetron (ZOFRAN) injection 4 mg (4 mg Intravenous Given 07/14/22 1236)     The patient appears reasonably screen and/or stabilized for discharge and I doubt any other medical condition or other [facility] requiring further screening, evaluation, or treatment in the ED at this time prior to discharge.           Final Clinical Impression(s) / ED Diagnoses Final diagnoses:  Nausea and vomiting in adult    Rx / DC Orders ED Discharge Orders          Ordered    ondansetron (ZOFRAN-ODT) 4 MG disintegrating tablet        07/14/22 1339              Deno Etienne, DO 07/14/22 1341

## 2022-07-14 NOTE — Discharge Instructions (Signed)
Follow up with your doctor in the office.  Return for abdominal pain, fever, inability to eat or drink

## 2022-07-14 NOTE — ED Notes (Signed)
Pt able to tolerate water, pt denies wanting food at this time

## 2022-07-14 NOTE — ED Notes (Signed)
Attempt to call report to [employer]

## 2022-07-14 NOTE — ED Triage Notes (Addendum)
Patient arrived from [facility] for vomiting x 2 weeks and also taking antibiotic for UTI and unsure if related. Patient with darker brown emesis x 3 days with BP low 100s. CBG 184. Patient has hx of dementia and follows commands with no difficulty. Received 500NS pta. Patient frustrated that she was brought to hospital and refuses labs. Abdomen non-tender to palpation. Patient alert to baseline and denies pain

## 2022-07-15 DIAGNOSIS — S42352A Displaced comminuted fracture of shaft of humerus, left arm, initial encounter for closed fracture: Secondary | ICD-10-CM | POA: Diagnosis not present

## 2022-07-15 DIAGNOSIS — M6281 Muscle weakness (generalized): Secondary | ICD-10-CM | POA: Diagnosis not present

## 2022-07-16 DIAGNOSIS — S42352A Displaced comminuted fracture of shaft of humerus, left arm, initial encounter for closed fracture: Secondary | ICD-10-CM | POA: Diagnosis not present

## 2022-07-16 DIAGNOSIS — M6281 Muscle weakness (generalized): Secondary | ICD-10-CM | POA: Diagnosis not present

## 2022-07-17 DIAGNOSIS — F0282 Dementia in other diseases classified elsewhere, unspecified severity, with psychotic disturbance: Secondary | ICD-10-CM | POA: Diagnosis not present

## 2022-07-17 DIAGNOSIS — K219 Gastro-esophageal reflux disease without esophagitis: Secondary | ICD-10-CM | POA: Diagnosis not present

## 2022-07-17 DIAGNOSIS — E1165 Type 2 diabetes mellitus with hyperglycemia: Secondary | ICD-10-CM | POA: Diagnosis not present

## 2022-07-19 DIAGNOSIS — S42352A Displaced comminuted fracture of shaft of humerus, left arm, initial encounter for closed fracture: Secondary | ICD-10-CM | POA: Diagnosis not present

## 2022-07-19 DIAGNOSIS — M6281 Muscle weakness (generalized): Secondary | ICD-10-CM | POA: Diagnosis not present

## 2022-07-24 DIAGNOSIS — M6281 Muscle weakness (generalized): Secondary | ICD-10-CM | POA: Diagnosis not present

## 2022-07-24 DIAGNOSIS — S42352A Displaced comminuted fracture of shaft of humerus, left arm, initial encounter for closed fracture: Secondary | ICD-10-CM | POA: Diagnosis not present

## 2022-07-25 DIAGNOSIS — M6281 Muscle weakness (generalized): Secondary | ICD-10-CM | POA: Diagnosis not present

## 2022-07-25 DIAGNOSIS — S42352A Displaced comminuted fracture of shaft of humerus, left arm, initial encounter for closed fracture: Secondary | ICD-10-CM | POA: Diagnosis not present

## 2022-07-26 DIAGNOSIS — M6281 Muscle weakness (generalized): Secondary | ICD-10-CM | POA: Diagnosis not present

## 2022-07-26 DIAGNOSIS — S42352D Displaced comminuted fracture of shaft of humerus, left arm, subsequent encounter for fracture with routine healing: Secondary | ICD-10-CM | POA: Diagnosis not present

## 2022-07-29 DIAGNOSIS — S42352D Displaced comminuted fracture of shaft of humerus, left arm, subsequent encounter for fracture with routine healing: Secondary | ICD-10-CM | POA: Diagnosis not present

## 2022-07-29 DIAGNOSIS — M6281 Muscle weakness (generalized): Secondary | ICD-10-CM | POA: Diagnosis not present

## 2022-07-30 DIAGNOSIS — M6281 Muscle weakness (generalized): Secondary | ICD-10-CM | POA: Diagnosis not present

## 2022-07-30 DIAGNOSIS — S42352D Displaced comminuted fracture of shaft of humerus, left arm, subsequent encounter for fracture with routine healing: Secondary | ICD-10-CM | POA: Diagnosis not present

## 2022-07-31 DIAGNOSIS — S42352D Displaced comminuted fracture of shaft of humerus, left arm, subsequent encounter for fracture with routine healing: Secondary | ICD-10-CM | POA: Diagnosis not present

## 2022-07-31 DIAGNOSIS — M6281 Muscle weakness (generalized): Secondary | ICD-10-CM | POA: Diagnosis not present

## 2022-08-01 DIAGNOSIS — S42352D Displaced comminuted fracture of shaft of humerus, left arm, subsequent encounter for fracture with routine healing: Secondary | ICD-10-CM | POA: Diagnosis not present

## 2022-08-01 DIAGNOSIS — M6281 Muscle weakness (generalized): Secondary | ICD-10-CM | POA: Diagnosis not present

## 2022-08-02 DIAGNOSIS — S42352D Displaced comminuted fracture of shaft of humerus, left arm, subsequent encounter for fracture with routine healing: Secondary | ICD-10-CM | POA: Diagnosis not present

## 2022-08-02 DIAGNOSIS — M6281 Muscle weakness (generalized): Secondary | ICD-10-CM | POA: Diagnosis not present

## 2022-08-05 DIAGNOSIS — S42352D Displaced comminuted fracture of shaft of humerus, left arm, subsequent encounter for fracture with routine healing: Secondary | ICD-10-CM | POA: Diagnosis not present

## 2022-08-05 DIAGNOSIS — M6281 Muscle weakness (generalized): Secondary | ICD-10-CM | POA: Diagnosis not present

## 2022-08-06 DIAGNOSIS — M6281 Muscle weakness (generalized): Secondary | ICD-10-CM | POA: Diagnosis not present

## 2022-08-06 DIAGNOSIS — S42352D Displaced comminuted fracture of shaft of humerus, left arm, subsequent encounter for fracture with routine healing: Secondary | ICD-10-CM | POA: Diagnosis not present

## 2022-08-07 DIAGNOSIS — M6281 Muscle weakness (generalized): Secondary | ICD-10-CM | POA: Diagnosis not present

## 2022-08-07 DIAGNOSIS — S42352D Displaced comminuted fracture of shaft of humerus, left arm, subsequent encounter for fracture with routine healing: Secondary | ICD-10-CM | POA: Diagnosis not present

## 2022-08-08 DIAGNOSIS — S42352D Displaced comminuted fracture of shaft of humerus, left arm, subsequent encounter for fracture with routine healing: Secondary | ICD-10-CM | POA: Diagnosis not present

## 2022-08-08 DIAGNOSIS — M6281 Muscle weakness (generalized): Secondary | ICD-10-CM | POA: Diagnosis not present

## 2022-08-09 DIAGNOSIS — M6281 Muscle weakness (generalized): Secondary | ICD-10-CM | POA: Diagnosis not present

## 2022-08-09 DIAGNOSIS — S42352D Displaced comminuted fracture of shaft of humerus, left arm, subsequent encounter for fracture with routine healing: Secondary | ICD-10-CM | POA: Diagnosis not present

## 2022-08-09 DIAGNOSIS — M19012 Primary osteoarthritis, left shoulder: Secondary | ICD-10-CM | POA: Diagnosis not present

## 2022-08-12 DIAGNOSIS — S42352D Displaced comminuted fracture of shaft of humerus, left arm, subsequent encounter for fracture with routine healing: Secondary | ICD-10-CM | POA: Diagnosis not present

## 2022-08-12 DIAGNOSIS — M6281 Muscle weakness (generalized): Secondary | ICD-10-CM | POA: Diagnosis not present

## 2022-08-13 DIAGNOSIS — M6281 Muscle weakness (generalized): Secondary | ICD-10-CM | POA: Diagnosis not present

## 2022-08-13 DIAGNOSIS — S42352D Displaced comminuted fracture of shaft of humerus, left arm, subsequent encounter for fracture with routine healing: Secondary | ICD-10-CM | POA: Diagnosis not present

## 2022-08-14 DIAGNOSIS — M6281 Muscle weakness (generalized): Secondary | ICD-10-CM | POA: Diagnosis not present

## 2022-08-14 DIAGNOSIS — S42352D Displaced comminuted fracture of shaft of humerus, left arm, subsequent encounter for fracture with routine healing: Secondary | ICD-10-CM | POA: Diagnosis not present

## 2022-08-23 DIAGNOSIS — E7800 Pure hypercholesterolemia, unspecified: Secondary | ICD-10-CM | POA: Diagnosis not present

## 2022-09-09 DIAGNOSIS — D464 Refractory anemia, unspecified: Secondary | ICD-10-CM | POA: Diagnosis not present

## 2022-09-09 DIAGNOSIS — E1165 Type 2 diabetes mellitus with hyperglycemia: Secondary | ICD-10-CM | POA: Diagnosis not present

## 2022-09-09 DIAGNOSIS — E785 Hyperlipidemia, unspecified: Secondary | ICD-10-CM | POA: Diagnosis not present

## 2022-09-12 DIAGNOSIS — E559 Vitamin D deficiency, unspecified: Secondary | ICD-10-CM | POA: Diagnosis not present

## 2022-09-12 DIAGNOSIS — E119 Type 2 diabetes mellitus without complications: Secondary | ICD-10-CM | POA: Diagnosis not present

## 2022-09-17 DIAGNOSIS — Z23 Encounter for immunization: Secondary | ICD-10-CM | POA: Diagnosis not present

## 2022-09-24 DIAGNOSIS — E1140 Type 2 diabetes mellitus with diabetic neuropathy, unspecified: Secondary | ICD-10-CM | POA: Diagnosis not present

## 2022-09-24 DIAGNOSIS — D464 Refractory anemia, unspecified: Secondary | ICD-10-CM | POA: Diagnosis not present

## 2022-09-24 DIAGNOSIS — E559 Vitamin D deficiency, unspecified: Secondary | ICD-10-CM | POA: Diagnosis not present

## 2022-09-24 DIAGNOSIS — E785 Hyperlipidemia, unspecified: Secondary | ICD-10-CM | POA: Diagnosis not present

## 2022-10-14 DIAGNOSIS — M6281 Muscle weakness (generalized): Secondary | ICD-10-CM | POA: Diagnosis not present

## 2022-10-14 DIAGNOSIS — I129 Hypertensive chronic kidney disease with stage 1 through stage 4 chronic kidney disease, or unspecified chronic kidney disease: Secondary | ICD-10-CM | POA: Diagnosis not present

## 2022-10-14 DIAGNOSIS — R2681 Unsteadiness on feet: Secondary | ICD-10-CM | POA: Diagnosis not present

## 2022-10-14 DIAGNOSIS — Z9181 History of falling: Secondary | ICD-10-CM | POA: Diagnosis not present

## 2022-10-14 DIAGNOSIS — J449 Chronic obstructive pulmonary disease, unspecified: Secondary | ICD-10-CM | POA: Diagnosis not present

## 2022-10-15 DIAGNOSIS — M6281 Muscle weakness (generalized): Secondary | ICD-10-CM | POA: Diagnosis not present

## 2022-10-15 DIAGNOSIS — I129 Hypertensive chronic kidney disease with stage 1 through stage 4 chronic kidney disease, or unspecified chronic kidney disease: Secondary | ICD-10-CM | POA: Diagnosis not present

## 2022-10-15 DIAGNOSIS — R2681 Unsteadiness on feet: Secondary | ICD-10-CM | POA: Diagnosis not present

## 2022-10-15 DIAGNOSIS — J449 Chronic obstructive pulmonary disease, unspecified: Secondary | ICD-10-CM | POA: Diagnosis not present

## 2022-10-15 DIAGNOSIS — Z9181 History of falling: Secondary | ICD-10-CM | POA: Diagnosis not present

## 2022-10-16 DIAGNOSIS — I129 Hypertensive chronic kidney disease with stage 1 through stage 4 chronic kidney disease, or unspecified chronic kidney disease: Secondary | ICD-10-CM | POA: Diagnosis not present

## 2022-10-16 DIAGNOSIS — J449 Chronic obstructive pulmonary disease, unspecified: Secondary | ICD-10-CM | POA: Diagnosis not present

## 2022-10-16 DIAGNOSIS — R2681 Unsteadiness on feet: Secondary | ICD-10-CM | POA: Diagnosis not present

## 2022-10-16 DIAGNOSIS — Z9181 History of falling: Secondary | ICD-10-CM | POA: Diagnosis not present

## 2022-10-16 DIAGNOSIS — M6281 Muscle weakness (generalized): Secondary | ICD-10-CM | POA: Diagnosis not present

## 2022-10-17 DIAGNOSIS — M6281 Muscle weakness (generalized): Secondary | ICD-10-CM | POA: Diagnosis not present

## 2022-10-17 DIAGNOSIS — R2681 Unsteadiness on feet: Secondary | ICD-10-CM | POA: Diagnosis not present

## 2022-10-17 DIAGNOSIS — J449 Chronic obstructive pulmonary disease, unspecified: Secondary | ICD-10-CM | POA: Diagnosis not present

## 2022-10-17 DIAGNOSIS — I129 Hypertensive chronic kidney disease with stage 1 through stage 4 chronic kidney disease, or unspecified chronic kidney disease: Secondary | ICD-10-CM | POA: Diagnosis not present

## 2022-10-17 DIAGNOSIS — Z9181 History of falling: Secondary | ICD-10-CM | POA: Diagnosis not present

## 2022-10-18 DIAGNOSIS — Z9181 History of falling: Secondary | ICD-10-CM | POA: Diagnosis not present

## 2022-10-18 DIAGNOSIS — R2681 Unsteadiness on feet: Secondary | ICD-10-CM | POA: Diagnosis not present

## 2022-10-18 DIAGNOSIS — J449 Chronic obstructive pulmonary disease, unspecified: Secondary | ICD-10-CM | POA: Diagnosis not present

## 2022-10-18 DIAGNOSIS — I129 Hypertensive chronic kidney disease with stage 1 through stage 4 chronic kidney disease, or unspecified chronic kidney disease: Secondary | ICD-10-CM | POA: Diagnosis not present

## 2022-10-18 DIAGNOSIS — M6281 Muscle weakness (generalized): Secondary | ICD-10-CM | POA: Diagnosis not present

## 2022-10-21 DIAGNOSIS — M6281 Muscle weakness (generalized): Secondary | ICD-10-CM | POA: Diagnosis not present

## 2022-10-21 DIAGNOSIS — R2681 Unsteadiness on feet: Secondary | ICD-10-CM | POA: Diagnosis not present

## 2022-10-21 DIAGNOSIS — Z9181 History of falling: Secondary | ICD-10-CM | POA: Diagnosis not present

## 2022-10-21 DIAGNOSIS — J449 Chronic obstructive pulmonary disease, unspecified: Secondary | ICD-10-CM | POA: Diagnosis not present

## 2022-10-21 DIAGNOSIS — I129 Hypertensive chronic kidney disease with stage 1 through stage 4 chronic kidney disease, or unspecified chronic kidney disease: Secondary | ICD-10-CM | POA: Diagnosis not present

## 2022-10-22 DIAGNOSIS — Z9181 History of falling: Secondary | ICD-10-CM | POA: Diagnosis not present

## 2022-10-22 DIAGNOSIS — J449 Chronic obstructive pulmonary disease, unspecified: Secondary | ICD-10-CM | POA: Diagnosis not present

## 2022-10-22 DIAGNOSIS — I129 Hypertensive chronic kidney disease with stage 1 through stage 4 chronic kidney disease, or unspecified chronic kidney disease: Secondary | ICD-10-CM | POA: Diagnosis not present

## 2022-10-22 DIAGNOSIS — R2681 Unsteadiness on feet: Secondary | ICD-10-CM | POA: Diagnosis not present

## 2022-10-22 DIAGNOSIS — M6281 Muscle weakness (generalized): Secondary | ICD-10-CM | POA: Diagnosis not present

## 2022-10-23 DIAGNOSIS — I129 Hypertensive chronic kidney disease with stage 1 through stage 4 chronic kidney disease, or unspecified chronic kidney disease: Secondary | ICD-10-CM | POA: Diagnosis not present

## 2022-10-23 DIAGNOSIS — U071 COVID-19: Secondary | ICD-10-CM | POA: Diagnosis not present

## 2022-10-23 DIAGNOSIS — R2681 Unsteadiness on feet: Secondary | ICD-10-CM | POA: Diagnosis not present

## 2022-10-23 DIAGNOSIS — Z9181 History of falling: Secondary | ICD-10-CM | POA: Diagnosis not present

## 2022-10-23 DIAGNOSIS — F0282 Dementia in other diseases classified elsewhere, unspecified severity, with psychotic disturbance: Secondary | ICD-10-CM | POA: Diagnosis not present

## 2022-10-23 DIAGNOSIS — M6281 Muscle weakness (generalized): Secondary | ICD-10-CM | POA: Diagnosis not present

## 2022-10-23 DIAGNOSIS — J449 Chronic obstructive pulmonary disease, unspecified: Secondary | ICD-10-CM | POA: Diagnosis not present

## 2022-10-24 DIAGNOSIS — M6281 Muscle weakness (generalized): Secondary | ICD-10-CM | POA: Diagnosis not present

## 2022-10-24 DIAGNOSIS — R2681 Unsteadiness on feet: Secondary | ICD-10-CM | POA: Diagnosis not present

## 2022-10-24 DIAGNOSIS — J449 Chronic obstructive pulmonary disease, unspecified: Secondary | ICD-10-CM | POA: Diagnosis not present

## 2022-10-24 DIAGNOSIS — Z9181 History of falling: Secondary | ICD-10-CM | POA: Diagnosis not present

## 2022-10-24 DIAGNOSIS — I129 Hypertensive chronic kidney disease with stage 1 through stage 4 chronic kidney disease, or unspecified chronic kidney disease: Secondary | ICD-10-CM | POA: Diagnosis not present

## 2022-10-25 DIAGNOSIS — M6281 Muscle weakness (generalized): Secondary | ICD-10-CM | POA: Diagnosis not present

## 2022-10-25 DIAGNOSIS — J449 Chronic obstructive pulmonary disease, unspecified: Secondary | ICD-10-CM | POA: Diagnosis not present

## 2022-10-25 DIAGNOSIS — Z9181 History of falling: Secondary | ICD-10-CM | POA: Diagnosis not present

## 2022-10-25 DIAGNOSIS — I129 Hypertensive chronic kidney disease with stage 1 through stage 4 chronic kidney disease, or unspecified chronic kidney disease: Secondary | ICD-10-CM | POA: Diagnosis not present

## 2022-10-25 DIAGNOSIS — R2681 Unsteadiness on feet: Secondary | ICD-10-CM | POA: Diagnosis not present

## 2022-10-28 DIAGNOSIS — J449 Chronic obstructive pulmonary disease, unspecified: Secondary | ICD-10-CM | POA: Diagnosis not present

## 2022-10-28 DIAGNOSIS — I129 Hypertensive chronic kidney disease with stage 1 through stage 4 chronic kidney disease, or unspecified chronic kidney disease: Secondary | ICD-10-CM | POA: Diagnosis not present

## 2022-10-28 DIAGNOSIS — M6281 Muscle weakness (generalized): Secondary | ICD-10-CM | POA: Diagnosis not present

## 2022-10-28 DIAGNOSIS — Z9181 History of falling: Secondary | ICD-10-CM | POA: Diagnosis not present

## 2022-10-28 DIAGNOSIS — R2681 Unsteadiness on feet: Secondary | ICD-10-CM | POA: Diagnosis not present

## 2022-10-29 DIAGNOSIS — R2681 Unsteadiness on feet: Secondary | ICD-10-CM | POA: Diagnosis not present

## 2022-10-29 DIAGNOSIS — Z9181 History of falling: Secondary | ICD-10-CM | POA: Diagnosis not present

## 2022-10-29 DIAGNOSIS — M6281 Muscle weakness (generalized): Secondary | ICD-10-CM | POA: Diagnosis not present

## 2022-10-29 DIAGNOSIS — I129 Hypertensive chronic kidney disease with stage 1 through stage 4 chronic kidney disease, or unspecified chronic kidney disease: Secondary | ICD-10-CM | POA: Diagnosis not present

## 2022-10-29 DIAGNOSIS — J449 Chronic obstructive pulmonary disease, unspecified: Secondary | ICD-10-CM | POA: Diagnosis not present

## 2022-10-30 DIAGNOSIS — Z9181 History of falling: Secondary | ICD-10-CM | POA: Diagnosis not present

## 2022-10-30 DIAGNOSIS — R2681 Unsteadiness on feet: Secondary | ICD-10-CM | POA: Diagnosis not present

## 2022-10-30 DIAGNOSIS — I129 Hypertensive chronic kidney disease with stage 1 through stage 4 chronic kidney disease, or unspecified chronic kidney disease: Secondary | ICD-10-CM | POA: Diagnosis not present

## 2022-10-30 DIAGNOSIS — M6281 Muscle weakness (generalized): Secondary | ICD-10-CM | POA: Diagnosis not present

## 2022-10-30 DIAGNOSIS — J449 Chronic obstructive pulmonary disease, unspecified: Secondary | ICD-10-CM | POA: Diagnosis not present

## 2022-10-31 DIAGNOSIS — R2681 Unsteadiness on feet: Secondary | ICD-10-CM | POA: Diagnosis not present

## 2022-10-31 DIAGNOSIS — Z9181 History of falling: Secondary | ICD-10-CM | POA: Diagnosis not present

## 2022-10-31 DIAGNOSIS — M6281 Muscle weakness (generalized): Secondary | ICD-10-CM | POA: Diagnosis not present

## 2022-10-31 DIAGNOSIS — J449 Chronic obstructive pulmonary disease, unspecified: Secondary | ICD-10-CM | POA: Diagnosis not present

## 2022-10-31 DIAGNOSIS — I129 Hypertensive chronic kidney disease with stage 1 through stage 4 chronic kidney disease, or unspecified chronic kidney disease: Secondary | ICD-10-CM | POA: Diagnosis not present

## 2022-11-01 DIAGNOSIS — R2681 Unsteadiness on feet: Secondary | ICD-10-CM | POA: Diagnosis not present

## 2022-11-01 DIAGNOSIS — M6281 Muscle weakness (generalized): Secondary | ICD-10-CM | POA: Diagnosis not present

## 2022-11-01 DIAGNOSIS — J449 Chronic obstructive pulmonary disease, unspecified: Secondary | ICD-10-CM | POA: Diagnosis not present

## 2022-11-01 DIAGNOSIS — Z9181 History of falling: Secondary | ICD-10-CM | POA: Diagnosis not present

## 2022-11-01 DIAGNOSIS — I129 Hypertensive chronic kidney disease with stage 1 through stage 4 chronic kidney disease, or unspecified chronic kidney disease: Secondary | ICD-10-CM | POA: Diagnosis not present

## 2022-11-04 DIAGNOSIS — J449 Chronic obstructive pulmonary disease, unspecified: Secondary | ICD-10-CM | POA: Diagnosis not present

## 2022-11-04 DIAGNOSIS — Z9181 History of falling: Secondary | ICD-10-CM | POA: Diagnosis not present

## 2022-11-04 DIAGNOSIS — R2681 Unsteadiness on feet: Secondary | ICD-10-CM | POA: Diagnosis not present

## 2022-11-04 DIAGNOSIS — I129 Hypertensive chronic kidney disease with stage 1 through stage 4 chronic kidney disease, or unspecified chronic kidney disease: Secondary | ICD-10-CM | POA: Diagnosis not present

## 2022-11-04 DIAGNOSIS — M6281 Muscle weakness (generalized): Secondary | ICD-10-CM | POA: Diagnosis not present

## 2022-11-05 DIAGNOSIS — Z9181 History of falling: Secondary | ICD-10-CM | POA: Diagnosis not present

## 2022-11-05 DIAGNOSIS — J449 Chronic obstructive pulmonary disease, unspecified: Secondary | ICD-10-CM | POA: Diagnosis not present

## 2022-11-05 DIAGNOSIS — R2681 Unsteadiness on feet: Secondary | ICD-10-CM | POA: Diagnosis not present

## 2022-11-05 DIAGNOSIS — M6281 Muscle weakness (generalized): Secondary | ICD-10-CM | POA: Diagnosis not present

## 2022-11-05 DIAGNOSIS — I129 Hypertensive chronic kidney disease with stage 1 through stage 4 chronic kidney disease, or unspecified chronic kidney disease: Secondary | ICD-10-CM | POA: Diagnosis not present

## 2022-11-14 DIAGNOSIS — D464 Refractory anemia, unspecified: Secondary | ICD-10-CM | POA: Diagnosis not present

## 2022-11-14 DIAGNOSIS — E559 Vitamin D deficiency, unspecified: Secondary | ICD-10-CM | POA: Diagnosis not present

## 2022-11-14 DIAGNOSIS — U071 COVID-19: Secondary | ICD-10-CM | POA: Diagnosis not present

## 2022-11-14 DIAGNOSIS — E785 Hyperlipidemia, unspecified: Secondary | ICD-10-CM | POA: Diagnosis not present

## 2022-11-19 ENCOUNTER — Encounter (HOSPITAL_COMMUNITY): Payer: Self-pay

## 2022-11-19 ENCOUNTER — Other Ambulatory Visit: Payer: Self-pay

## 2022-11-19 ENCOUNTER — Emergency Department (HOSPITAL_COMMUNITY): Payer: Medicare Other

## 2022-11-19 ENCOUNTER — Emergency Department (HOSPITAL_COMMUNITY)
Admission: EM | Admit: 2022-11-19 | Discharge: 2022-11-20 | Disposition: A | Discharge status: Skilled Nursing Facility | Payer: Medicare Other | Attending: Emergency Medicine | Admitting: Emergency Medicine

## 2022-11-19 DIAGNOSIS — Z79899 Other long term (current) drug therapy: Secondary | ICD-10-CM | POA: Diagnosis not present

## 2022-11-19 DIAGNOSIS — R600 Localized edema: Secondary | ICD-10-CM | POA: Diagnosis not present

## 2022-11-19 DIAGNOSIS — M11262 Other chondrocalcinosis, left knee: Secondary | ICD-10-CM | POA: Diagnosis not present

## 2022-11-19 DIAGNOSIS — W19XXXA Unspecified fall, initial encounter: Secondary | ICD-10-CM | POA: Insufficient documentation

## 2022-11-19 DIAGNOSIS — R739 Hyperglycemia, unspecified: Secondary | ICD-10-CM | POA: Diagnosis not present

## 2022-11-19 DIAGNOSIS — Z743 Need for continuous supervision: Secondary | ICD-10-CM | POA: Diagnosis not present

## 2022-11-19 DIAGNOSIS — Z794 Long term (current) use of insulin: Secondary | ICD-10-CM | POA: Insufficient documentation

## 2022-11-19 DIAGNOSIS — S82142A Displaced bicondylar fracture of left tibia, initial encounter for closed fracture: Secondary | ICD-10-CM | POA: Diagnosis not present

## 2022-11-19 DIAGNOSIS — S8992XA Unspecified injury of left lower leg, initial encounter: Secondary | ICD-10-CM | POA: Diagnosis present

## 2022-11-19 DIAGNOSIS — F0390 Unspecified dementia without behavioral disturbance: Secondary | ICD-10-CM | POA: Diagnosis not present

## 2022-11-19 DIAGNOSIS — Z043 Encounter for examination and observation following other accident: Secondary | ICD-10-CM | POA: Diagnosis not present

## 2022-11-19 DIAGNOSIS — S82102A Unspecified fracture of upper end of left tibia, initial encounter for closed fracture: Secondary | ICD-10-CM | POA: Insufficient documentation

## 2022-11-19 DIAGNOSIS — M79632 Pain in left forearm: Secondary | ICD-10-CM | POA: Diagnosis not present

## 2022-11-19 DIAGNOSIS — M25562 Pain in left knee: Secondary | ICD-10-CM | POA: Diagnosis not present

## 2022-11-19 MED ORDER — OXYCODONE-ACETAMINOPHEN 5-325 MG PO TABS
1.0000 | ORAL_TABLET | Freq: Once | ORAL | Status: AC
  Administered 2022-11-19: 1 via ORAL
  Filled 2022-11-19: qty 1

## 2022-11-19 NOTE — ED Notes (Signed)
Pt returned from xray

## 2022-11-19 NOTE — ED Notes (Signed)
Pt refused lab draws with this Probation officer.

## 2022-11-19 NOTE — ED Provider Notes (Signed)
[facility] EMERGENCY DEPARTMENT Provider Note   CSN: [id number] Arrival date & time: 11/19/22  2052     History {Add pertinent medical, surgical, social history, OB history to HPI:1} Chief Complaint  Patient presents with   tibial fracture    Yvonne [name] is a 76 y.o. female.  HPI Patient presents from [facility] facility for concern of tibial plateau fracture on her left leg.  Patient is unable to provide history due to history of dementia but does endorse pain in the tibial plateau area of her leg that extends halfway down to her foot.  She denies any other areas of discomfort.  History per staff at [facility] facility.  Patient had a fall earlier today.  This is the reason she underwent x-ray imaging which showed concern of fracture.  At baseline, she is able to walk but this is discouraged due to her dementia and fall [name].  She has not attempted to walk since the fall.    Home Medications Prior to Admission medications   Medication Sig Start Date End Date Taking? Authorizing Provider  acetaminophen (TYLENOL) 500 MG tablet Take 500 mg by mouth every 6 (six) hours as needed (pain.).    [provider]  albuterol (PROVENTIL HFA;VENTOLIN HFA) 108 (90 BASE) MCG/ACT inhaler Inhale 2 puffs into the lungs every 6 (six) hours as needed for wheezing. 07/20/15 09/20/21  Tresa Garter, MD  barrier cream (NON-SPECIFIED) CREA Apply 1 application topically. Apply TO BUTTOCKS WITH BATHING AND INCONTINENCE CARE 10/24/20   [provider]  divalproex (DEPAKOTE SPRINKLE) 125 MG capsule Take 125 mg by mouth 2 (two) times daily. 02/11/21   [provider]  divalproex (DEPAKOTE) 125 MG DR tablet Take 125 mg by mouth 2 (two) times daily.    [provider]  docusate sodium (COLACE) 100 MG capsule Take 100 mg by mouth 2 (two) times daily. For Constipation 11/03/20   [provider]  DULoxetine (CYMBALTA) 30 MG  capsule Take 30 mg by mouth daily. 10/18/20   [provider]  DULoxetine (CYMBALTA) 60 MG capsule Take 60 mg by mouth daily. For Depression 10/24/20   [provider]  furosemide (LASIX) 40 MG tablet Take 1 tablet (40 mg total) by mouth 2 (two) times daily. 03/29/20   Geradine Girt, DO  gabapentin (NEURONTIN) 100 MG capsule Take 100 mg by mouth 3 (three) times daily. (0900, 1400, & 2100)    [provider]  HYDROcodone-acetaminophen (NORCO/VICODIN) 5-325 MG tablet Take 1 tablet by mouth 3 (three) times daily as needed. 10/10/20   [provider]  insulin glargine (LANTUS) 100 UNIT/ML injection Inject 0.27 mLs (27 Units total) into the skin at bedtime. 11/01/20   Fargo, Amy E, NP  insulin lispro (HUMALOG) 100 UNIT/ML KwikPen Inject into the skin 4 (four) times daily -  before meals and at bedtime. CBG 121-150=1 U, 151-200= 2 U, 201-250= 3 U, 251-300=5 U, 301-350= 7 U, 351-400= 9 U, GREATER THAN 400= 9 U, CALL MD    [provider]  magic mouthwash w/lidocaine SOLN Take 5 mLs by mouth 3 (three) times daily as needed for mouth pain. 03/29/20   Geradine Girt, DO  metoprolol tartrate (LOPRESSOR) 25 MG tablet Take 0.5 tablets (12.5 mg total) by mouth 2 (two) times daily. 03/29/20   Geradine Girt, DO  Multiple Vitamin (MULTIVITAMIN WITH MINERALS) TABS tablet Take 1 tablet by mouth daily. 03/29/20   Geradine Girt, DO  ondansetron (ZOFRAN-ODT) 4 MG disintegrating tablet '4mg'$  ODT q4 hours prn nausea/vomit 07/14/22   Deno Etienne, DO  pantoprazole (PROTONIX) 40 MG tablet Take 1 tablet (40 mg total) by mouth every 12 (twelve) hours. 03/29/20   Geradine Girt, DO  polyethylene glycol (MIRALAX / GLYCOLAX) 17 g packet Take 17 g by mouth daily.    [provider]  pravastatin (PRAVACHOL) 20 MG tablet Take 20 mg by mouth daily.    [provider]      Allergies    Clonazepam and Trazodone and nefazodone    Review of Systems   Review of Systems  Unable to  perform ROS: Dementia  Musculoskeletal:  Positive for arthralgias.    Physical Exam Updated Vital Signs BP (!) 153/68 (BP Location: Left Arm)   Pulse 63   Temp 98.5 F (36.9 C) (Oral)   Resp 16   Ht '5\' 1"'$  (1.549 m)   Wt 93.5 kg   SpO2 98%   BMI 38.96 kg/m  Physical Exam Vitals and nursing note reviewed.  Constitutional:      General: She is not in acute distress.    Appearance: Normal appearance. She is well-developed. She is ill-appearing (Chronically). She is not toxic-appearing or diaphoretic.  HENT:     Head: Normocephalic and atraumatic.     Right Ear: External ear normal.     Left Ear: External ear normal.     Nose: Nose normal.     Mouth/Throat:     Mouth: Mucous membranes are moist.  Eyes:     Extraocular Movements: Extraocular movements intact.     Conjunctiva/sclera: Conjunctivae normal.  Cardiovascular:     Rate and Rhythm: Normal rate and regular rhythm.     Heart sounds: No murmur heard. Pulmonary:     Effort: Pulmonary effort is normal. No respiratory distress.     Breath sounds: Normal breath sounds. No wheezing or rales.  Chest:     Chest wall: No tenderness.  Abdominal:     General: There is no distension.     Palpations: Abdomen is soft.     Tenderness: There is no abdominal tenderness.  Musculoskeletal:        General: Tenderness present. No swelling or deformity.     Cervical back: Normal range of motion and neck supple.     Right lower leg: No edema.     Left lower leg: No edema.  Skin:    General: Skin is warm and dry.     Capillary Refill: Capillary refill takes less than 2 seconds.     Coloration: Skin is not jaundiced or pale.  Neurological:     General: No focal deficit present.     Mental Status: She is alert. Mental status is at baseline.     Cranial Nerves: No cranial nerve deficit.     Sensory: No sensory deficit.     Motor: No weakness.     Coordination: Coordination normal.  Psychiatric:        Mood and Affect: Mood normal.         Behavior: Behavior normal.     ED Results / Procedures / Treatments   Labs (all labs ordered are listed, but only abnormal results are displayed) Labs Reviewed - No data to display  EKG None  Radiology No results found.  Procedures Procedures  {Document cardiac monitor, telemetry assessment procedure when appropriate:1}  Medications Ordered in ED Medications - No data to display  ED Course/ Medical Decision Making/  A&P                           Medical Decision Making Amount and/or Complexity of Data Reviewed Radiology: ordered.  [name] Prescription drug management.   This patient presents to the ED for concern of leg injury, this involves an extensive number of treatment options, and is a complaint that carries with it a high [name] of complications and morbidity.  The differential diagnosis includes acute fracture, chronic changes, soft tissue injury   Co morbidities that complicate the patient evaluation  Dementia, COPD, DM, chronic pain, depression, HTN, PAD.   Additional history obtained:  Additional history obtained from staff at her nursing facility External records from outside source obtained and reviewed including EMR  Imaging Studies ordered:  I ordered imaging studies including left tip/fib x-ray, CT of knee I independently visualized and interpreted imaging which showed *** I agree with the radiologist interpretation   Cardiac Monitoring: / EKG:  The patient was maintained on a cardiac monitor.  I personally viewed and interpreted the cardiac monitored which showed an underlying rhythm of: ***   Consultations Obtained:  I requested consultation with the ***,  and discussed lab and imaging findings as well as pertinent plan - they recommend: ***   Problem List / ED Course / Critical interventions / Medication management  Patient with history of dementia presenting from [facility] facility for concern of tibial plateau fracture.   She underwent x-ray at the facility following a fall that occurred earlier today.  Details of the fall are unknown.  There is practitioner who evaluated her and reviewed x-ray sent her to the ED for evaluation.  On arrival in the ED, patient is found resting comfortably.  When asked specifically if she has left leg pain, she states that she does.  She endorses pain in the area of tibial plateau.  There is tenderness and mild warmth present to this area.  X-ray imaging was ordered. I ordered medication including ***  for ***  Reevaluation of the patient after these medicines showed that the patient {resolved/improved/worsened:23923::"improved"} I have reviewed the patients home medicines and have made adjustments as needed   Social Determinants of Health:  ***   Test / Admission - Considered:  ***   {Document critical care time when appropriate:1} {Document review of labs and clinical decision tools ie heart score, Chads2Vasc2 etc:1}  {Document your independent review of radiology images, and any outside records:1} {Document your discussion with family members, caretakers, and with consultants:1} {Document social determinants of health affecting pt's care:1} {Document your decision making why or why not admission, treatments were needed:1} Final Clinical Impression(s) / ED Diagnoses Final diagnoses:  None    Rx / DC Orders ED Discharge Orders     None

## 2022-11-19 NOTE — ED Triage Notes (Signed)
Pt to ED via [facility] from [facility]. Pt had an x-ray today that showed a left tibia plateau fracture. Per EMS, facility staff stated that pt did not fall, pt does not remember why she received x-ray. Pt c/o lower back pain. Pt alert to self which is her normal, NAD noted.

## 2022-11-19 NOTE — ED Notes (Signed)
Pt to xray

## 2022-11-19 NOTE — ED Notes (Signed)
Dr Dixon at bedside.

## 2022-11-20 DIAGNOSIS — Z743 Need for continuous supervision: Secondary | ICD-10-CM | POA: Diagnosis not present

## 2022-11-20 DIAGNOSIS — R531 Weakness: Secondary | ICD-10-CM | POA: Diagnosis not present

## 2022-11-20 NOTE — Discharge Instructions (Signed)
The CT imaging of your left knee and tibial plateau area showed chronic changes only without evidence of acute injury.  Return to the emergency department as needed for any new symptoms of concern.

## 2022-11-20 NOTE — ED Notes (Signed)
Pt refuses to stay in bed, pt walked to bathroom w/o difficulty.

## 2022-11-20 NOTE — ED Notes (Signed)
Pt refused EKG.

## 2022-11-20 NOTE — ED Notes (Signed)
[facility] called to transport pt back to [facility].

## 2022-11-22 DIAGNOSIS — M25462 Effusion, left knee: Secondary | ICD-10-CM | POA: Diagnosis not present

## 2022-11-22 DIAGNOSIS — W19XXXA Unspecified fall, initial encounter: Secondary | ICD-10-CM | POA: Diagnosis not present

## 2022-11-22 DIAGNOSIS — M6281 Muscle weakness (generalized): Secondary | ICD-10-CM | POA: Diagnosis not present

## 2022-11-22 DIAGNOSIS — M1712 Unilateral primary osteoarthritis, left knee: Secondary | ICD-10-CM | POA: Diagnosis not present

## 2022-11-22 DIAGNOSIS — M25562 Pain in left knee: Secondary | ICD-10-CM | POA: Diagnosis not present

## 2022-12-18 DIAGNOSIS — R296 Repeated falls: Secondary | ICD-10-CM | POA: Diagnosis not present

## 2022-12-22 DIAGNOSIS — M6281 Muscle weakness (generalized): Secondary | ICD-10-CM | POA: Diagnosis not present

## 2022-12-22 DIAGNOSIS — R2689 Other abnormalities of gait and mobility: Secondary | ICD-10-CM | POA: Diagnosis not present

## 2022-12-22 DIAGNOSIS — J449 Chronic obstructive pulmonary disease, unspecified: Secondary | ICD-10-CM | POA: Diagnosis not present

## 2022-12-24 DIAGNOSIS — R2689 Other abnormalities of gait and mobility: Secondary | ICD-10-CM | POA: Diagnosis not present

## 2022-12-24 DIAGNOSIS — M6281 Muscle weakness (generalized): Secondary | ICD-10-CM | POA: Diagnosis not present

## 2022-12-24 DIAGNOSIS — J449 Chronic obstructive pulmonary disease, unspecified: Secondary | ICD-10-CM | POA: Diagnosis not present

## 2022-12-25 DIAGNOSIS — J449 Chronic obstructive pulmonary disease, unspecified: Secondary | ICD-10-CM | POA: Diagnosis not present

## 2022-12-25 DIAGNOSIS — M6281 Muscle weakness (generalized): Secondary | ICD-10-CM | POA: Diagnosis not present

## 2022-12-25 DIAGNOSIS — R2689 Other abnormalities of gait and mobility: Secondary | ICD-10-CM | POA: Diagnosis not present

## 2022-12-26 DIAGNOSIS — M6281 Muscle weakness (generalized): Secondary | ICD-10-CM | POA: Diagnosis not present

## 2022-12-26 DIAGNOSIS — J449 Chronic obstructive pulmonary disease, unspecified: Secondary | ICD-10-CM | POA: Diagnosis not present

## 2022-12-26 DIAGNOSIS — R2689 Other abnormalities of gait and mobility: Secondary | ICD-10-CM | POA: Diagnosis not present

## 2022-12-31 DIAGNOSIS — J449 Chronic obstructive pulmonary disease, unspecified: Secondary | ICD-10-CM | POA: Diagnosis not present

## 2022-12-31 DIAGNOSIS — M6281 Muscle weakness (generalized): Secondary | ICD-10-CM | POA: Diagnosis not present

## 2022-12-31 DIAGNOSIS — R2689 Other abnormalities of gait and mobility: Secondary | ICD-10-CM | POA: Diagnosis not present

## 2023-01-07 DIAGNOSIS — M6281 Muscle weakness (generalized): Secondary | ICD-10-CM | POA: Diagnosis not present

## 2023-01-07 DIAGNOSIS — R2689 Other abnormalities of gait and mobility: Secondary | ICD-10-CM | POA: Diagnosis not present

## 2023-01-07 DIAGNOSIS — J449 Chronic obstructive pulmonary disease, unspecified: Secondary | ICD-10-CM | POA: Diagnosis not present

## 2023-01-08 DIAGNOSIS — R2689 Other abnormalities of gait and mobility: Secondary | ICD-10-CM | POA: Diagnosis not present

## 2023-01-08 DIAGNOSIS — J449 Chronic obstructive pulmonary disease, unspecified: Secondary | ICD-10-CM | POA: Diagnosis not present

## 2023-01-08 DIAGNOSIS — M6281 Muscle weakness (generalized): Secondary | ICD-10-CM | POA: Diagnosis not present

## 2023-01-09 DIAGNOSIS — E103293 Type 1 diabetes mellitus with mild nonproliferative diabetic retinopathy without macular edema, bilateral: Secondary | ICD-10-CM | POA: Diagnosis not present

## 2023-01-09 DIAGNOSIS — J449 Chronic obstructive pulmonary disease, unspecified: Secondary | ICD-10-CM | POA: Diagnosis not present

## 2023-01-09 DIAGNOSIS — R2689 Other abnormalities of gait and mobility: Secondary | ICD-10-CM | POA: Diagnosis not present

## 2023-01-09 DIAGNOSIS — H35013 Changes in retinal vascular appearance, bilateral: Secondary | ICD-10-CM | POA: Diagnosis not present

## 2023-01-09 DIAGNOSIS — M6281 Muscle weakness (generalized): Secondary | ICD-10-CM | POA: Diagnosis not present

## 2023-01-10 DIAGNOSIS — R2689 Other abnormalities of gait and mobility: Secondary | ICD-10-CM | POA: Diagnosis not present

## 2023-01-10 DIAGNOSIS — M6281 Muscle weakness (generalized): Secondary | ICD-10-CM | POA: Diagnosis not present

## 2023-01-10 DIAGNOSIS — J449 Chronic obstructive pulmonary disease, unspecified: Secondary | ICD-10-CM | POA: Diagnosis not present

## 2023-01-11 DIAGNOSIS — M6281 Muscle weakness (generalized): Secondary | ICD-10-CM | POA: Diagnosis not present

## 2023-01-11 DIAGNOSIS — R2689 Other abnormalities of gait and mobility: Secondary | ICD-10-CM | POA: Diagnosis not present

## 2023-01-11 DIAGNOSIS — J449 Chronic obstructive pulmonary disease, unspecified: Secondary | ICD-10-CM | POA: Diagnosis not present

## 2023-01-14 DIAGNOSIS — R2689 Other abnormalities of gait and mobility: Secondary | ICD-10-CM | POA: Diagnosis not present

## 2023-01-14 DIAGNOSIS — J449 Chronic obstructive pulmonary disease, unspecified: Secondary | ICD-10-CM | POA: Diagnosis not present

## 2023-01-14 DIAGNOSIS — M6281 Muscle weakness (generalized): Secondary | ICD-10-CM | POA: Diagnosis not present

## 2023-01-15 DIAGNOSIS — R569 Unspecified convulsions: Secondary | ICD-10-CM | POA: Diagnosis not present

## 2023-01-15 DIAGNOSIS — I1310 Hypertensive heart and chronic kidney disease without heart failure, with stage 1 through stage 4 chronic kidney disease, or unspecified chronic kidney disease: Secondary | ICD-10-CM | POA: Diagnosis not present

## 2023-01-15 DIAGNOSIS — R2689 Other abnormalities of gait and mobility: Secondary | ICD-10-CM | POA: Diagnosis not present

## 2023-01-15 DIAGNOSIS — M6281 Muscle weakness (generalized): Secondary | ICD-10-CM | POA: Diagnosis not present

## 2023-01-15 DIAGNOSIS — E1140 Type 2 diabetes mellitus with diabetic neuropathy, unspecified: Secondary | ICD-10-CM | POA: Diagnosis not present

## 2023-01-15 DIAGNOSIS — J449 Chronic obstructive pulmonary disease, unspecified: Secondary | ICD-10-CM | POA: Diagnosis not present

## 2023-01-15 DIAGNOSIS — K219 Gastro-esophageal reflux disease without esophagitis: Secondary | ICD-10-CM | POA: Diagnosis not present

## 2023-01-16 DIAGNOSIS — M6281 Muscle weakness (generalized): Secondary | ICD-10-CM | POA: Diagnosis not present

## 2023-01-16 DIAGNOSIS — R2689 Other abnormalities of gait and mobility: Secondary | ICD-10-CM | POA: Diagnosis not present

## 2023-01-16 DIAGNOSIS — E119 Type 2 diabetes mellitus without complications: Secondary | ICD-10-CM | POA: Diagnosis not present

## 2023-01-16 DIAGNOSIS — J449 Chronic obstructive pulmonary disease, unspecified: Secondary | ICD-10-CM | POA: Diagnosis not present

## 2023-01-17 DIAGNOSIS — J449 Chronic obstructive pulmonary disease, unspecified: Secondary | ICD-10-CM | POA: Diagnosis not present

## 2023-01-17 DIAGNOSIS — R2689 Other abnormalities of gait and mobility: Secondary | ICD-10-CM | POA: Diagnosis not present

## 2023-01-17 DIAGNOSIS — M6281 Muscle weakness (generalized): Secondary | ICD-10-CM | POA: Diagnosis not present

## 2023-01-18 DIAGNOSIS — R2689 Other abnormalities of gait and mobility: Secondary | ICD-10-CM | POA: Diagnosis not present

## 2023-01-18 DIAGNOSIS — M6281 Muscle weakness (generalized): Secondary | ICD-10-CM | POA: Diagnosis not present

## 2023-01-18 DIAGNOSIS — J449 Chronic obstructive pulmonary disease, unspecified: Secondary | ICD-10-CM | POA: Diagnosis not present

## 2023-01-19 DIAGNOSIS — M6281 Muscle weakness (generalized): Secondary | ICD-10-CM | POA: Diagnosis not present

## 2023-01-19 DIAGNOSIS — R2689 Other abnormalities of gait and mobility: Secondary | ICD-10-CM | POA: Diagnosis not present

## 2023-01-19 DIAGNOSIS — J449 Chronic obstructive pulmonary disease, unspecified: Secondary | ICD-10-CM | POA: Diagnosis not present

## 2023-01-20 DIAGNOSIS — M6281 Muscle weakness (generalized): Secondary | ICD-10-CM | POA: Diagnosis not present

## 2023-01-20 DIAGNOSIS — J449 Chronic obstructive pulmonary disease, unspecified: Secondary | ICD-10-CM | POA: Diagnosis not present

## 2023-01-20 DIAGNOSIS — R2689 Other abnormalities of gait and mobility: Secondary | ICD-10-CM | POA: Diagnosis not present

## 2023-01-21 DIAGNOSIS — J449 Chronic obstructive pulmonary disease, unspecified: Secondary | ICD-10-CM | POA: Diagnosis not present

## 2023-01-21 DIAGNOSIS — R2689 Other abnormalities of gait and mobility: Secondary | ICD-10-CM | POA: Diagnosis not present

## 2023-01-21 DIAGNOSIS — M6281 Muscle weakness (generalized): Secondary | ICD-10-CM | POA: Diagnosis not present

## 2023-01-23 DIAGNOSIS — J449 Chronic obstructive pulmonary disease, unspecified: Secondary | ICD-10-CM | POA: Diagnosis not present

## 2023-01-23 DIAGNOSIS — R2689 Other abnormalities of gait and mobility: Secondary | ICD-10-CM | POA: Diagnosis not present

## 2023-01-23 DIAGNOSIS — M6281 Muscle weakness (generalized): Secondary | ICD-10-CM | POA: Diagnosis not present

## 2023-01-24 DIAGNOSIS — M6281 Muscle weakness (generalized): Secondary | ICD-10-CM | POA: Diagnosis not present

## 2023-01-24 DIAGNOSIS — J449 Chronic obstructive pulmonary disease, unspecified: Secondary | ICD-10-CM | POA: Diagnosis not present

## 2023-01-24 DIAGNOSIS — R2689 Other abnormalities of gait and mobility: Secondary | ICD-10-CM | POA: Diagnosis not present

## 2023-01-30 DIAGNOSIS — E559 Vitamin D deficiency, unspecified: Secondary | ICD-10-CM | POA: Diagnosis not present

## 2023-03-13 DIAGNOSIS — I1310 Hypertensive heart and chronic kidney disease without heart failure, with stage 1 through stage 4 chronic kidney disease, or unspecified chronic kidney disease: Secondary | ICD-10-CM | POA: Diagnosis not present

## 2023-03-13 DIAGNOSIS — R569 Unspecified convulsions: Secondary | ICD-10-CM | POA: Diagnosis not present

## 2023-03-13 DIAGNOSIS — E1140 Type 2 diabetes mellitus with diabetic neuropathy, unspecified: Secondary | ICD-10-CM | POA: Diagnosis not present

## 2023-03-13 DIAGNOSIS — K219 Gastro-esophageal reflux disease without esophagitis: Secondary | ICD-10-CM | POA: Diagnosis not present

## 2023-04-02 DIAGNOSIS — M25562 Pain in left knee: Secondary | ICD-10-CM | POA: Diagnosis not present

## 2023-04-02 DIAGNOSIS — R531 Weakness: Secondary | ICD-10-CM | POA: Diagnosis not present

## 2023-04-02 DIAGNOSIS — R2681 Unsteadiness on feet: Secondary | ICD-10-CM | POA: Diagnosis not present

## 2023-04-02 DIAGNOSIS — M6281 Muscle weakness (generalized): Secondary | ICD-10-CM | POA: Diagnosis not present

## 2023-04-02 DIAGNOSIS — R2689 Other abnormalities of gait and mobility: Secondary | ICD-10-CM | POA: Diagnosis not present

## 2023-04-02 DIAGNOSIS — J449 Chronic obstructive pulmonary disease, unspecified: Secondary | ICD-10-CM | POA: Diagnosis not present

## 2023-04-02 DIAGNOSIS — R1312 Dysphagia, oropharyngeal phase: Secondary | ICD-10-CM | POA: Diagnosis not present

## 2023-04-03 DIAGNOSIS — R2681 Unsteadiness on feet: Secondary | ICD-10-CM | POA: Diagnosis not present

## 2023-04-03 DIAGNOSIS — L603 Nail dystrophy: Secondary | ICD-10-CM | POA: Diagnosis not present

## 2023-04-03 DIAGNOSIS — R1312 Dysphagia, oropharyngeal phase: Secondary | ICD-10-CM | POA: Diagnosis not present

## 2023-04-03 DIAGNOSIS — E1051 Type 1 diabetes mellitus with diabetic peripheral angiopathy without gangrene: Secondary | ICD-10-CM | POA: Diagnosis not present

## 2023-04-03 DIAGNOSIS — J449 Chronic obstructive pulmonary disease, unspecified: Secondary | ICD-10-CM | POA: Diagnosis not present

## 2023-04-03 DIAGNOSIS — R2689 Other abnormalities of gait and mobility: Secondary | ICD-10-CM | POA: Diagnosis not present

## 2023-04-03 DIAGNOSIS — R531 Weakness: Secondary | ICD-10-CM | POA: Diagnosis not present

## 2023-04-03 DIAGNOSIS — M6281 Muscle weakness (generalized): Secondary | ICD-10-CM | POA: Diagnosis not present

## 2023-04-04 DIAGNOSIS — R2689 Other abnormalities of gait and mobility: Secondary | ICD-10-CM | POA: Diagnosis not present

## 2023-04-04 DIAGNOSIS — I10 Essential (primary) hypertension: Secondary | ICD-10-CM | POA: Diagnosis not present

## 2023-04-04 DIAGNOSIS — M6281 Muscle weakness (generalized): Secondary | ICD-10-CM | POA: Diagnosis not present

## 2023-04-04 DIAGNOSIS — J449 Chronic obstructive pulmonary disease, unspecified: Secondary | ICD-10-CM | POA: Diagnosis not present

## 2023-04-04 DIAGNOSIS — R1312 Dysphagia, oropharyngeal phase: Secondary | ICD-10-CM | POA: Diagnosis not present

## 2023-04-04 DIAGNOSIS — R531 Weakness: Secondary | ICD-10-CM | POA: Diagnosis not present

## 2023-04-04 DIAGNOSIS — R2681 Unsteadiness on feet: Secondary | ICD-10-CM | POA: Diagnosis not present

## 2023-04-07 DIAGNOSIS — R1312 Dysphagia, oropharyngeal phase: Secondary | ICD-10-CM | POA: Diagnosis not present

## 2023-04-07 DIAGNOSIS — J449 Chronic obstructive pulmonary disease, unspecified: Secondary | ICD-10-CM | POA: Diagnosis not present

## 2023-04-07 DIAGNOSIS — R531 Weakness: Secondary | ICD-10-CM | POA: Diagnosis not present

## 2023-04-07 DIAGNOSIS — R2689 Other abnormalities of gait and mobility: Secondary | ICD-10-CM | POA: Diagnosis not present

## 2023-04-07 DIAGNOSIS — R2681 Unsteadiness on feet: Secondary | ICD-10-CM | POA: Diagnosis not present

## 2023-04-07 DIAGNOSIS — M6281 Muscle weakness (generalized): Secondary | ICD-10-CM | POA: Diagnosis not present

## 2023-04-08 DIAGNOSIS — M6281 Muscle weakness (generalized): Secondary | ICD-10-CM | POA: Diagnosis not present

## 2023-04-08 DIAGNOSIS — R2689 Other abnormalities of gait and mobility: Secondary | ICD-10-CM | POA: Diagnosis not present

## 2023-04-08 DIAGNOSIS — J449 Chronic obstructive pulmonary disease, unspecified: Secondary | ICD-10-CM | POA: Diagnosis not present

## 2023-04-08 DIAGNOSIS — R1312 Dysphagia, oropharyngeal phase: Secondary | ICD-10-CM | POA: Diagnosis not present

## 2023-04-08 DIAGNOSIS — R531 Weakness: Secondary | ICD-10-CM | POA: Diagnosis not present

## 2023-04-08 DIAGNOSIS — R2681 Unsteadiness on feet: Secondary | ICD-10-CM | POA: Diagnosis not present

## 2023-04-09 DIAGNOSIS — R2689 Other abnormalities of gait and mobility: Secondary | ICD-10-CM | POA: Diagnosis not present

## 2023-04-09 DIAGNOSIS — M6281 Muscle weakness (generalized): Secondary | ICD-10-CM | POA: Diagnosis not present

## 2023-04-09 DIAGNOSIS — R1312 Dysphagia, oropharyngeal phase: Secondary | ICD-10-CM | POA: Diagnosis not present

## 2023-04-09 DIAGNOSIS — J449 Chronic obstructive pulmonary disease, unspecified: Secondary | ICD-10-CM | POA: Diagnosis not present

## 2023-04-09 DIAGNOSIS — R2681 Unsteadiness on feet: Secondary | ICD-10-CM | POA: Diagnosis not present

## 2023-04-09 DIAGNOSIS — R531 Weakness: Secondary | ICD-10-CM | POA: Diagnosis not present

## 2023-04-10 DIAGNOSIS — R531 Weakness: Secondary | ICD-10-CM | POA: Diagnosis not present

## 2023-04-10 DIAGNOSIS — R2689 Other abnormalities of gait and mobility: Secondary | ICD-10-CM | POA: Diagnosis not present

## 2023-04-10 DIAGNOSIS — R1312 Dysphagia, oropharyngeal phase: Secondary | ICD-10-CM | POA: Diagnosis not present

## 2023-04-10 DIAGNOSIS — R2681 Unsteadiness on feet: Secondary | ICD-10-CM | POA: Diagnosis not present

## 2023-04-10 DIAGNOSIS — J449 Chronic obstructive pulmonary disease, unspecified: Secondary | ICD-10-CM | POA: Diagnosis not present

## 2023-04-10 DIAGNOSIS — M6281 Muscle weakness (generalized): Secondary | ICD-10-CM | POA: Diagnosis not present

## 2023-04-11 DIAGNOSIS — J449 Chronic obstructive pulmonary disease, unspecified: Secondary | ICD-10-CM | POA: Diagnosis not present

## 2023-04-11 DIAGNOSIS — R1312 Dysphagia, oropharyngeal phase: Secondary | ICD-10-CM | POA: Diagnosis not present

## 2023-04-11 DIAGNOSIS — M6281 Muscle weakness (generalized): Secondary | ICD-10-CM | POA: Diagnosis not present

## 2023-04-11 DIAGNOSIS — R531 Weakness: Secondary | ICD-10-CM | POA: Diagnosis not present

## 2023-04-11 DIAGNOSIS — R2689 Other abnormalities of gait and mobility: Secondary | ICD-10-CM | POA: Diagnosis not present

## 2023-04-11 DIAGNOSIS — R2681 Unsteadiness on feet: Secondary | ICD-10-CM | POA: Diagnosis not present

## 2023-04-13 DIAGNOSIS — R531 Weakness: Secondary | ICD-10-CM | POA: Diagnosis not present

## 2023-04-13 DIAGNOSIS — R2689 Other abnormalities of gait and mobility: Secondary | ICD-10-CM | POA: Diagnosis not present

## 2023-04-13 DIAGNOSIS — J449 Chronic obstructive pulmonary disease, unspecified: Secondary | ICD-10-CM | POA: Diagnosis not present

## 2023-04-13 DIAGNOSIS — R1312 Dysphagia, oropharyngeal phase: Secondary | ICD-10-CM | POA: Diagnosis not present

## 2023-04-13 DIAGNOSIS — M6281 Muscle weakness (generalized): Secondary | ICD-10-CM | POA: Diagnosis not present

## 2023-04-13 DIAGNOSIS — R2681 Unsteadiness on feet: Secondary | ICD-10-CM | POA: Diagnosis not present

## 2023-04-14 DIAGNOSIS — R1312 Dysphagia, oropharyngeal phase: Secondary | ICD-10-CM | POA: Diagnosis not present

## 2023-04-14 DIAGNOSIS — E119 Type 2 diabetes mellitus without complications: Secondary | ICD-10-CM | POA: Diagnosis not present

## 2023-04-14 DIAGNOSIS — J449 Chronic obstructive pulmonary disease, unspecified: Secondary | ICD-10-CM | POA: Diagnosis not present

## 2023-04-14 DIAGNOSIS — R2681 Unsteadiness on feet: Secondary | ICD-10-CM | POA: Diagnosis not present

## 2023-04-14 DIAGNOSIS — I10 Essential (primary) hypertension: Secondary | ICD-10-CM | POA: Diagnosis not present

## 2023-04-14 DIAGNOSIS — R531 Weakness: Secondary | ICD-10-CM | POA: Diagnosis not present

## 2023-04-14 DIAGNOSIS — R2689 Other abnormalities of gait and mobility: Secondary | ICD-10-CM | POA: Diagnosis not present

## 2023-04-14 DIAGNOSIS — M6281 Muscle weakness (generalized): Secondary | ICD-10-CM | POA: Diagnosis not present

## 2023-04-15 DIAGNOSIS — R531 Weakness: Secondary | ICD-10-CM | POA: Diagnosis not present

## 2023-04-15 DIAGNOSIS — R2681 Unsteadiness on feet: Secondary | ICD-10-CM | POA: Diagnosis not present

## 2023-04-15 DIAGNOSIS — R1312 Dysphagia, oropharyngeal phase: Secondary | ICD-10-CM | POA: Diagnosis not present

## 2023-04-15 DIAGNOSIS — R2689 Other abnormalities of gait and mobility: Secondary | ICD-10-CM | POA: Diagnosis not present

## 2023-04-15 DIAGNOSIS — J449 Chronic obstructive pulmonary disease, unspecified: Secondary | ICD-10-CM | POA: Diagnosis not present

## 2023-04-15 DIAGNOSIS — M6281 Muscle weakness (generalized): Secondary | ICD-10-CM | POA: Diagnosis not present

## 2023-04-17 DIAGNOSIS — M6281 Muscle weakness (generalized): Secondary | ICD-10-CM | POA: Diagnosis not present

## 2023-04-17 DIAGNOSIS — R531 Weakness: Secondary | ICD-10-CM | POA: Diagnosis not present

## 2023-04-17 DIAGNOSIS — R2689 Other abnormalities of gait and mobility: Secondary | ICD-10-CM | POA: Diagnosis not present

## 2023-04-17 DIAGNOSIS — J449 Chronic obstructive pulmonary disease, unspecified: Secondary | ICD-10-CM | POA: Diagnosis not present

## 2023-04-17 DIAGNOSIS — R2681 Unsteadiness on feet: Secondary | ICD-10-CM | POA: Diagnosis not present

## 2023-04-17 DIAGNOSIS — R1312 Dysphagia, oropharyngeal phase: Secondary | ICD-10-CM | POA: Diagnosis not present

## 2023-04-18 DIAGNOSIS — M6281 Muscle weakness (generalized): Secondary | ICD-10-CM | POA: Diagnosis not present

## 2023-04-18 DIAGNOSIS — R1312 Dysphagia, oropharyngeal phase: Secondary | ICD-10-CM | POA: Diagnosis not present

## 2023-04-18 DIAGNOSIS — R2689 Other abnormalities of gait and mobility: Secondary | ICD-10-CM | POA: Diagnosis not present

## 2023-04-18 DIAGNOSIS — R2681 Unsteadiness on feet: Secondary | ICD-10-CM | POA: Diagnosis not present

## 2023-04-18 DIAGNOSIS — R531 Weakness: Secondary | ICD-10-CM | POA: Diagnosis not present

## 2023-04-18 DIAGNOSIS — J449 Chronic obstructive pulmonary disease, unspecified: Secondary | ICD-10-CM | POA: Diagnosis not present

## 2023-04-20 DIAGNOSIS — R531 Weakness: Secondary | ICD-10-CM | POA: Diagnosis not present

## 2023-04-20 DIAGNOSIS — J449 Chronic obstructive pulmonary disease, unspecified: Secondary | ICD-10-CM | POA: Diagnosis not present

## 2023-04-20 DIAGNOSIS — M6281 Muscle weakness (generalized): Secondary | ICD-10-CM | POA: Diagnosis not present

## 2023-04-20 DIAGNOSIS — R1312 Dysphagia, oropharyngeal phase: Secondary | ICD-10-CM | POA: Diagnosis not present

## 2023-04-20 DIAGNOSIS — R2689 Other abnormalities of gait and mobility: Secondary | ICD-10-CM | POA: Diagnosis not present

## 2023-04-20 DIAGNOSIS — R2681 Unsteadiness on feet: Secondary | ICD-10-CM | POA: Diagnosis not present

## 2023-04-21 DIAGNOSIS — M6281 Muscle weakness (generalized): Secondary | ICD-10-CM | POA: Diagnosis not present

## 2023-04-21 DIAGNOSIS — R2681 Unsteadiness on feet: Secondary | ICD-10-CM | POA: Diagnosis not present

## 2023-04-21 DIAGNOSIS — J449 Chronic obstructive pulmonary disease, unspecified: Secondary | ICD-10-CM | POA: Diagnosis not present

## 2023-04-21 DIAGNOSIS — R1312 Dysphagia, oropharyngeal phase: Secondary | ICD-10-CM | POA: Diagnosis not present

## 2023-04-21 DIAGNOSIS — R2689 Other abnormalities of gait and mobility: Secondary | ICD-10-CM | POA: Diagnosis not present

## 2023-04-21 DIAGNOSIS — R531 Weakness: Secondary | ICD-10-CM | POA: Diagnosis not present

## 2023-04-22 DIAGNOSIS — R531 Weakness: Secondary | ICD-10-CM | POA: Diagnosis not present

## 2023-04-22 DIAGNOSIS — R2689 Other abnormalities of gait and mobility: Secondary | ICD-10-CM | POA: Diagnosis not present

## 2023-04-22 DIAGNOSIS — J449 Chronic obstructive pulmonary disease, unspecified: Secondary | ICD-10-CM | POA: Diagnosis not present

## 2023-04-22 DIAGNOSIS — M6281 Muscle weakness (generalized): Secondary | ICD-10-CM | POA: Diagnosis not present

## 2023-04-22 DIAGNOSIS — R1312 Dysphagia, oropharyngeal phase: Secondary | ICD-10-CM | POA: Diagnosis not present

## 2023-04-22 DIAGNOSIS — R2681 Unsteadiness on feet: Secondary | ICD-10-CM | POA: Diagnosis not present

## 2023-04-23 DIAGNOSIS — M6281 Muscle weakness (generalized): Secondary | ICD-10-CM | POA: Diagnosis not present

## 2023-04-23 DIAGNOSIS — R2681 Unsteadiness on feet: Secondary | ICD-10-CM | POA: Diagnosis not present

## 2023-04-23 DIAGNOSIS — R1312 Dysphagia, oropharyngeal phase: Secondary | ICD-10-CM | POA: Diagnosis not present

## 2023-04-23 DIAGNOSIS — J449 Chronic obstructive pulmonary disease, unspecified: Secondary | ICD-10-CM | POA: Diagnosis not present

## 2023-04-23 DIAGNOSIS — R531 Weakness: Secondary | ICD-10-CM | POA: Diagnosis not present

## 2023-04-23 DIAGNOSIS — R2689 Other abnormalities of gait and mobility: Secondary | ICD-10-CM | POA: Diagnosis not present

## 2023-05-03 ENCOUNTER — Emergency Department (HOSPITAL_COMMUNITY)
Admission: EM | Admit: 2023-05-03 | Discharge: 2023-05-04 | Disposition: A | Discharge status: Home / Self Care | Payer: 59 | Attending: Emergency Medicine | Admitting: Emergency Medicine

## 2023-05-03 ENCOUNTER — Encounter (HOSPITAL_COMMUNITY): Payer: Self-pay | Admitting: *Deleted

## 2023-05-03 ENCOUNTER — Other Ambulatory Visit: Payer: Self-pay

## 2023-05-03 DIAGNOSIS — Z794 Long term (current) use of insulin: Secondary | ICD-10-CM | POA: Insufficient documentation

## 2023-05-03 DIAGNOSIS — F0390 Unspecified dementia without behavioral disturbance: Secondary | ICD-10-CM | POA: Insufficient documentation

## 2023-05-03 DIAGNOSIS — R0902 Hypoxemia: Secondary | ICD-10-CM | POA: Diagnosis not present

## 2023-05-03 DIAGNOSIS — R6889 Other general symptoms and signs: Secondary | ICD-10-CM | POA: Diagnosis not present

## 2023-05-03 DIAGNOSIS — R079 Chest pain, unspecified: Secondary | ICD-10-CM | POA: Insufficient documentation

## 2023-05-03 DIAGNOSIS — R61 Generalized hyperhidrosis: Secondary | ICD-10-CM | POA: Diagnosis not present

## 2023-05-03 DIAGNOSIS — Z743 Need for continuous supervision: Secondary | ICD-10-CM | POA: Diagnosis not present

## 2023-05-03 LAB — BASIC METABOLIC PANEL
Anion gap: 9 (ref 5–15)
BUN: 32 mg/dL — ABNORMAL HIGH (ref 8–23)
CO2: 26 mmol/L (ref 22–32)
Calcium: 9.3 mg/dL (ref 8.9–10.3)
Chloride: 99 mmol/L (ref 98–111)
Creatinine, Ser: 1.46 mg/dL — ABNORMAL HIGH (ref 0.44–1.00)
GFR, Estimated: 37 mL/min — ABNORMAL LOW (ref >60)
Glucose, Bld: 145 mg/dL — ABNORMAL HIGH (ref 70–99)
Potassium: 3.9 mmol/L (ref 3.5–5.1)
Sodium: 134 mmol/L — ABNORMAL LOW (ref 135–145)

## 2023-05-03 LAB — CBC
HCT: 33.3 % — ABNORMAL LOW (ref 36.0–46.0)
Hemoglobin: 10.1 g/dL — ABNORMAL LOW (ref 12.0–15.0)
MCH: 23 pg — ABNORMAL LOW (ref 26.0–34.0)
MCHC: 30.3 g/dL (ref 30.0–36.0)
MCV: 75.9 fL — ABNORMAL LOW (ref 80.0–100.0)
Platelets: 316 10*3/uL (ref 150–400)
RBC: 4.39 MIL/uL (ref 3.87–5.11)
RDW: 14.6 % (ref 11.5–15.5)
WBC: 10.5 10*3/uL (ref 4.0–10.5)
nRBC: 0 % (ref 0.0–0.2)

## 2023-05-03 LAB — TROPONIN I (HIGH SENSITIVITY): Troponin I (High Sensitivity): 7 ng/L (ref <18)

## 2023-05-03 NOTE — ED Triage Notes (Signed)
The pt arrived by [facility] from  [facility]  with some chest pain  ems gave her aspirin sl nitro  that did nothing for her pain   iv lt forearm by ems alert

## 2023-05-04 DIAGNOSIS — Z743 Need for continuous supervision: Secondary | ICD-10-CM | POA: Diagnosis not present

## 2023-05-04 DIAGNOSIS — Z7401 Bed confinement status: Secondary | ICD-10-CM | POA: Diagnosis not present

## 2023-05-04 DIAGNOSIS — R079 Chest pain, unspecified: Secondary | ICD-10-CM | POA: Diagnosis not present

## 2023-05-04 LAB — TROPONIN I (HIGH SENSITIVITY): Troponin I (High Sensitivity): 8 ng/L

## 2023-05-04 NOTE — ED Notes (Signed)
[facility] called, ptar stated 'first on the list"

## 2023-05-11 NOTE — ED Provider Notes (Signed)
[place] [facility] Provider Note   CSN: [id number] Arrival date & time: 05/03/23  2039     History  Chief Complaint  Patient presents with   Chest Pain    Yvonne [name] is a 77 y.o. female.  H/o dementia. Reportedly had cp at the facility and sent here. Denies this now and states she doesn't know why she is here but also doesn't know where she's at. No current chest pain, sob, fever, cough.    Chest Pain      Home Medications Prior to Admission medications   Medication Sig Start Date End Date Taking? Authorizing Provider  acetaminophen (TYLENOL) 500 MG tablet Take 500 mg by mouth every 6 (six) hours as needed (pain.).    [provider]  albuterol (PROVENTIL HFA;VENTOLIN HFA) 108 (90 BASE) MCG/ACT inhaler Inhale 2 puffs into the lungs every 6 (six) hours as needed for wheezing. 07/20/15 09/20/21  Quentin Angst, MD  barrier cream (NON-SPECIFIED) CREA Apply 1 application topically. Apply TO BUTTOCKS WITH BATHING AND INCONTINENCE CARE 10/24/20   [provider]  divalproex (DEPAKOTE SPRINKLE) 125 MG capsule Take 125 mg by mouth 2 (two) times daily. 02/11/21   [provider]  divalproex (DEPAKOTE) 125 MG DR tablet Take 125 mg by mouth 2 (two) times daily.    [provider]  docusate sodium (COLACE) 100 MG capsule Take 100 mg by mouth 2 (two) times daily. For Constipation 11/03/20   [provider]  DULoxetine (CYMBALTA) 30 MG capsule Take 30 mg by mouth daily. 10/18/20   [provider]  DULoxetine (CYMBALTA) 60 MG capsule Take 60 mg by mouth daily. For Depression 10/24/20   [provider]  furosemide (LASIX) 40 MG tablet Take 1 tablet (40 mg total) by mouth 2 (two) times daily. 03/29/20   Joseph Art, DO  gabapentin (NEURONTIN) 100 MG capsule Take 100 mg by mouth 3 (three) times daily. (0900, 1400, & 2100)    [provider]  HYDROcodone-acetaminophen  (NORCO/VICODIN) 5-325 MG tablet Take 1 tablet by mouth 3 (three) times daily as needed. 10/10/20   [provider]  insulin glargine (LANTUS) 100 UNIT/ML injection Inject 0.27 mLs (27 Units total) into the skin at bedtime. 11/01/20   Fargo, Amy E, NP  insulin lispro (HUMALOG) 100 UNIT/ML KwikPen Inject into the skin 4 (four) times daily -  before meals and at bedtime. CBG 121-150=1 U, 151-200= 2 U, 201-250= 3 U, 251-300=5 U, 301-350= 7 U, 351-400= 9 U, GREATER THAN 400= 9 U, CALL MD    [provider]  magic mouthwash w/lidocaine SOLN Take 5 mLs by mouth 3 (three) times daily as needed for mouth pain. 03/29/20   Joseph Art, DO  metoprolol tartrate (LOPRESSOR) 25 MG tablet Take 0.5 tablets (12.5 mg total) by mouth 2 (two) times daily. 03/29/20   Joseph Art, DO  Multiple Vitamin (MULTIVITAMIN WITH MINERALS) TABS tablet Take 1 tablet by mouth daily. 03/29/20   Joseph Art, DO  ondansetron (ZOFRAN-ODT) 4 MG disintegrating tablet 4mg  ODT q4 hours prn nausea/vomit 07/14/22   Melene Plan, DO  pantoprazole (PROTONIX) 40 MG tablet Take 1 tablet (40 mg total) by mouth every 12 (twelve) hours. 03/29/20   Joseph Art, DO  polyethylene glycol (MIRALAX / GLYCOLAX) 17 g packet Take 17 g by mouth daily.    [provider]  pravastatin (PRAVACHOL) 20 MG tablet Take 20 mg by mouth daily.  [provider]      Allergies    Clonazepam and Trazodone and nefazodone    Review of Systems   Review of Systems  Cardiovascular:  Positive for chest pain.    Physical Exam Updated Vital Signs BP 120/88 (BP Location: Right Arm)   Pulse 68   Temp 97.6 F (36.4 C) (Oral)   Resp 16   Ht 5\' 1"  (1.549 m)   Wt 93.5 kg   SpO2 97%   BMI 38.95 kg/m  Physical Exam Vitals and nursing note reviewed.  Constitutional:      Appearance: She is well-developed.  HENT:     Head: Normocephalic and atraumatic.  Cardiovascular:     Rate and Rhythm: Normal rate and regular rhythm.   Pulmonary:     Effort: Pulmonary effort is normal. No respiratory distress.     Breath sounds: No stridor.  Abdominal:     General: There is no distension.  Musculoskeletal:        General: Normal range of motion.     Cervical back: Normal range of motion.     Right lower leg: No tenderness. No edema.     Left lower leg: No tenderness. No edema.  Neurological:     Mental Status: She is alert.     ED Results / Procedures / Treatments   Labs (all labs ordered are listed, but only abnormal results are displayed) Labs Reviewed  BASIC METABOLIC PANEL - Abnormal; Notable for the following components:      Result Value   Sodium 134 (*)    Glucose, Bld 145 (*)    BUN 32 (*)    Creatinine, Ser 1.46 (*)    GFR, Estimated 37 (*)    All other components within normal limits  CBC - Abnormal; Notable for the following components:   Hemoglobin 10.1 (*)    HCT 33.3 (*)    MCV 75.9 (*)    MCH 23.0 (*)    All other components within normal limits  TROPONIN I (HIGH SENSITIVITY)  TROPONIN I (HIGH SENSITIVITY)    EKG EKG Interpretation  Date/Time:  Saturday May 03 2023 23:19:18 EDT Ventricular Rate:  72 PR Interval:  137 QRS Duration: 139 QT Interval:  453 QTC Calculation: 496 R Axis:   260 Text Interpretation: Sinus rhythm Right bundle branch block Inferolateral infarct, old Confirmed by Kennis Carina [phone]) on 05/04/2023 11:44:40 AM  Radiology No results found.  Procedures Procedures    Medications Ordered in ED Medications - No data to display  ED Course/ Medical Decision Making/ A&P                             Medical Decision Making  Low suspicion for ACS. Doubt PE. No e/o zoster. No e/o pneumonia. No e/o pneumothorax. Troponins normal. Ecg normal. Stable for discharge.    Final Clinical Impression(s) / ED Diagnoses Final diagnoses:  Nonspecific chest pain    Rx / DC Orders ED Discharge Orders     None         Makhari Dovidio, Barbara Cower, MD 05/11/23 2342

## 2023-05-15 DIAGNOSIS — R569 Unspecified convulsions: Secondary | ICD-10-CM | POA: Diagnosis not present

## 2023-05-15 DIAGNOSIS — K219 Gastro-esophageal reflux disease without esophagitis: Secondary | ICD-10-CM | POA: Diagnosis not present

## 2023-05-15 DIAGNOSIS — I1310 Hypertensive heart and chronic kidney disease without heart failure, with stage 1 through stage 4 chronic kidney disease, or unspecified chronic kidney disease: Secondary | ICD-10-CM | POA: Diagnosis not present

## 2023-05-15 DIAGNOSIS — E1140 Type 2 diabetes mellitus with diabetic neuropathy, unspecified: Secondary | ICD-10-CM | POA: Diagnosis not present

## 2023-06-03 DIAGNOSIS — N189 Chronic kidney disease, unspecified: Secondary | ICD-10-CM | POA: Diagnosis not present

## 2023-06-03 DIAGNOSIS — M6281 Muscle weakness (generalized): Secondary | ICD-10-CM | POA: Diagnosis not present

## 2023-06-03 DIAGNOSIS — E785 Hyperlipidemia, unspecified: Secondary | ICD-10-CM | POA: Diagnosis not present

## 2023-06-03 DIAGNOSIS — R2689 Other abnormalities of gait and mobility: Secondary | ICD-10-CM | POA: Diagnosis not present

## 2023-06-03 DIAGNOSIS — J449 Chronic obstructive pulmonary disease, unspecified: Secondary | ICD-10-CM | POA: Diagnosis not present

## 2023-06-04 DIAGNOSIS — J449 Chronic obstructive pulmonary disease, unspecified: Secondary | ICD-10-CM | POA: Diagnosis not present

## 2023-06-04 DIAGNOSIS — R2689 Other abnormalities of gait and mobility: Secondary | ICD-10-CM | POA: Diagnosis not present

## 2023-06-04 DIAGNOSIS — N189 Chronic kidney disease, unspecified: Secondary | ICD-10-CM | POA: Diagnosis not present

## 2023-06-04 DIAGNOSIS — M6281 Muscle weakness (generalized): Secondary | ICD-10-CM | POA: Diagnosis not present

## 2023-06-04 DIAGNOSIS — E785 Hyperlipidemia, unspecified: Secondary | ICD-10-CM | POA: Diagnosis not present

## 2023-06-05 DIAGNOSIS — J449 Chronic obstructive pulmonary disease, unspecified: Secondary | ICD-10-CM | POA: Diagnosis not present

## 2023-06-05 DIAGNOSIS — M6281 Muscle weakness (generalized): Secondary | ICD-10-CM | POA: Diagnosis not present

## 2023-06-05 DIAGNOSIS — R2689 Other abnormalities of gait and mobility: Secondary | ICD-10-CM | POA: Diagnosis not present

## 2023-06-05 DIAGNOSIS — E785 Hyperlipidemia, unspecified: Secondary | ICD-10-CM | POA: Diagnosis not present

## 2023-06-05 DIAGNOSIS — N189 Chronic kidney disease, unspecified: Secondary | ICD-10-CM | POA: Diagnosis not present

## 2023-06-06 DIAGNOSIS — R2689 Other abnormalities of gait and mobility: Secondary | ICD-10-CM | POA: Diagnosis not present

## 2023-06-06 DIAGNOSIS — M6281 Muscle weakness (generalized): Secondary | ICD-10-CM | POA: Diagnosis not present

## 2023-06-06 DIAGNOSIS — E785 Hyperlipidemia, unspecified: Secondary | ICD-10-CM | POA: Diagnosis not present

## 2023-06-06 DIAGNOSIS — J449 Chronic obstructive pulmonary disease, unspecified: Secondary | ICD-10-CM | POA: Diagnosis not present

## 2023-06-06 DIAGNOSIS — N189 Chronic kidney disease, unspecified: Secondary | ICD-10-CM | POA: Diagnosis not present

## 2023-06-08 DIAGNOSIS — E785 Hyperlipidemia, unspecified: Secondary | ICD-10-CM | POA: Diagnosis not present

## 2023-06-08 DIAGNOSIS — N189 Chronic kidney disease, unspecified: Secondary | ICD-10-CM | POA: Diagnosis not present

## 2023-06-08 DIAGNOSIS — R2689 Other abnormalities of gait and mobility: Secondary | ICD-10-CM | POA: Diagnosis not present

## 2023-06-08 DIAGNOSIS — J449 Chronic obstructive pulmonary disease, unspecified: Secondary | ICD-10-CM | POA: Diagnosis not present

## 2023-06-08 DIAGNOSIS — M6281 Muscle weakness (generalized): Secondary | ICD-10-CM | POA: Diagnosis not present

## 2023-06-09 DIAGNOSIS — E785 Hyperlipidemia, unspecified: Secondary | ICD-10-CM | POA: Diagnosis not present

## 2023-06-09 DIAGNOSIS — J449 Chronic obstructive pulmonary disease, unspecified: Secondary | ICD-10-CM | POA: Diagnosis not present

## 2023-06-09 DIAGNOSIS — N189 Chronic kidney disease, unspecified: Secondary | ICD-10-CM | POA: Diagnosis not present

## 2023-06-09 DIAGNOSIS — M6281 Muscle weakness (generalized): Secondary | ICD-10-CM | POA: Diagnosis not present

## 2023-06-09 DIAGNOSIS — R2689 Other abnormalities of gait and mobility: Secondary | ICD-10-CM | POA: Diagnosis not present

## 2023-06-10 DIAGNOSIS — E785 Hyperlipidemia, unspecified: Secondary | ICD-10-CM | POA: Diagnosis not present

## 2023-06-10 DIAGNOSIS — R2689 Other abnormalities of gait and mobility: Secondary | ICD-10-CM | POA: Diagnosis not present

## 2023-06-10 DIAGNOSIS — J449 Chronic obstructive pulmonary disease, unspecified: Secondary | ICD-10-CM | POA: Diagnosis not present

## 2023-06-10 DIAGNOSIS — M6281 Muscle weakness (generalized): Secondary | ICD-10-CM | POA: Diagnosis not present

## 2023-06-10 DIAGNOSIS — N189 Chronic kidney disease, unspecified: Secondary | ICD-10-CM | POA: Diagnosis not present

## 2023-06-11 DIAGNOSIS — N189 Chronic kidney disease, unspecified: Secondary | ICD-10-CM | POA: Diagnosis not present

## 2023-06-11 DIAGNOSIS — R2689 Other abnormalities of gait and mobility: Secondary | ICD-10-CM | POA: Diagnosis not present

## 2023-06-11 DIAGNOSIS — J449 Chronic obstructive pulmonary disease, unspecified: Secondary | ICD-10-CM | POA: Diagnosis not present

## 2023-06-11 DIAGNOSIS — M6281 Muscle weakness (generalized): Secondary | ICD-10-CM | POA: Diagnosis not present

## 2023-06-11 DIAGNOSIS — E785 Hyperlipidemia, unspecified: Secondary | ICD-10-CM | POA: Diagnosis not present

## 2023-06-12 DIAGNOSIS — M6281 Muscle weakness (generalized): Secondary | ICD-10-CM | POA: Diagnosis not present

## 2023-06-12 DIAGNOSIS — N189 Chronic kidney disease, unspecified: Secondary | ICD-10-CM | POA: Diagnosis not present

## 2023-06-12 DIAGNOSIS — E785 Hyperlipidemia, unspecified: Secondary | ICD-10-CM | POA: Diagnosis not present

## 2023-06-12 DIAGNOSIS — J449 Chronic obstructive pulmonary disease, unspecified: Secondary | ICD-10-CM | POA: Diagnosis not present

## 2023-06-12 DIAGNOSIS — R2689 Other abnormalities of gait and mobility: Secondary | ICD-10-CM | POA: Diagnosis not present

## 2023-06-13 DIAGNOSIS — M6281 Muscle weakness (generalized): Secondary | ICD-10-CM | POA: Diagnosis not present

## 2023-06-13 DIAGNOSIS — R2689 Other abnormalities of gait and mobility: Secondary | ICD-10-CM | POA: Diagnosis not present

## 2023-06-13 DIAGNOSIS — J449 Chronic obstructive pulmonary disease, unspecified: Secondary | ICD-10-CM | POA: Diagnosis not present

## 2023-06-13 DIAGNOSIS — N189 Chronic kidney disease, unspecified: Secondary | ICD-10-CM | POA: Diagnosis not present

## 2023-06-13 DIAGNOSIS — E785 Hyperlipidemia, unspecified: Secondary | ICD-10-CM | POA: Diagnosis not present

## 2023-06-16 DIAGNOSIS — M6281 Muscle weakness (generalized): Secondary | ICD-10-CM | POA: Diagnosis not present

## 2023-06-16 DIAGNOSIS — E785 Hyperlipidemia, unspecified: Secondary | ICD-10-CM | POA: Diagnosis not present

## 2023-06-16 DIAGNOSIS — R2689 Other abnormalities of gait and mobility: Secondary | ICD-10-CM | POA: Diagnosis not present

## 2023-06-16 DIAGNOSIS — N189 Chronic kidney disease, unspecified: Secondary | ICD-10-CM | POA: Diagnosis not present

## 2023-06-16 DIAGNOSIS — J449 Chronic obstructive pulmonary disease, unspecified: Secondary | ICD-10-CM | POA: Diagnosis not present

## 2023-06-24 DIAGNOSIS — I129 Hypertensive chronic kidney disease with stage 1 through stage 4 chronic kidney disease, or unspecified chronic kidney disease: Secondary | ICD-10-CM | POA: Diagnosis not present

## 2023-06-24 DIAGNOSIS — E1140 Type 2 diabetes mellitus with diabetic neuropathy, unspecified: Secondary | ICD-10-CM | POA: Diagnosis not present

## 2023-06-24 DIAGNOSIS — E559 Vitamin D deficiency, unspecified: Secondary | ICD-10-CM | POA: Diagnosis not present

## 2023-06-27 DIAGNOSIS — E1140 Type 2 diabetes mellitus with diabetic neuropathy, unspecified: Secondary | ICD-10-CM | POA: Diagnosis not present

## 2023-06-27 DIAGNOSIS — R1111 Vomiting without nausea: Secondary | ICD-10-CM | POA: Diagnosis not present

## 2023-06-27 DIAGNOSIS — N390 Urinary tract infection, site not specified: Secondary | ICD-10-CM | POA: Diagnosis not present

## 2023-06-27 DIAGNOSIS — I10 Essential (primary) hypertension: Secondary | ICD-10-CM | POA: Diagnosis not present

## 2023-06-27 DIAGNOSIS — R0989 Other specified symptoms and signs involving the circulatory and respiratory systems: Secondary | ICD-10-CM | POA: Diagnosis not present

## 2023-06-30 DIAGNOSIS — N3000 Acute cystitis without hematuria: Secondary | ICD-10-CM | POA: Diagnosis not present

## 2023-06-30 DIAGNOSIS — R1111 Vomiting without nausea: Secondary | ICD-10-CM | POA: Diagnosis not present

## 2023-06-30 DIAGNOSIS — D72829 Elevated white blood cell count, unspecified: Secondary | ICD-10-CM | POA: Diagnosis not present

## 2023-07-01 DIAGNOSIS — E1140 Type 2 diabetes mellitus with diabetic neuropathy, unspecified: Secondary | ICD-10-CM | POA: Diagnosis not present

## 2023-07-01 DIAGNOSIS — R569 Unspecified convulsions: Secondary | ICD-10-CM | POA: Diagnosis not present

## 2023-07-01 DIAGNOSIS — I1310 Hypertensive heart and chronic kidney disease without heart failure, with stage 1 through stage 4 chronic kidney disease, or unspecified chronic kidney disease: Secondary | ICD-10-CM | POA: Diagnosis not present

## 2023-07-16 DIAGNOSIS — K219 Gastro-esophageal reflux disease without esophagitis: Secondary | ICD-10-CM | POA: Diagnosis not present

## 2023-07-16 DIAGNOSIS — E785 Hyperlipidemia, unspecified: Secondary | ICD-10-CM | POA: Diagnosis not present

## 2023-07-16 DIAGNOSIS — E1140 Type 2 diabetes mellitus with diabetic neuropathy, unspecified: Secondary | ICD-10-CM | POA: Diagnosis not present

## 2023-08-05 DIAGNOSIS — N189 Chronic kidney disease, unspecified: Secondary | ICD-10-CM | POA: Diagnosis not present

## 2023-08-06 DIAGNOSIS — E103293 Type 1 diabetes mellitus with mild nonproliferative diabetic retinopathy without macular edema, bilateral: Secondary | ICD-10-CM | POA: Diagnosis not present

## 2023-08-20 DIAGNOSIS — E1140 Type 2 diabetes mellitus with diabetic neuropathy, unspecified: Secondary | ICD-10-CM | POA: Diagnosis not present

## 2023-08-20 DIAGNOSIS — I1310 Hypertensive heart and chronic kidney disease without heart failure, with stage 1 through stage 4 chronic kidney disease, or unspecified chronic kidney disease: Secondary | ICD-10-CM | POA: Diagnosis not present

## 2023-08-29 DIAGNOSIS — R1084 Generalized abdominal pain: Secondary | ICD-10-CM | POA: Diagnosis not present

## 2023-08-29 DIAGNOSIS — E1140 Type 2 diabetes mellitus with diabetic neuropathy, unspecified: Secondary | ICD-10-CM | POA: Diagnosis not present

## 2023-08-29 DIAGNOSIS — R109 Unspecified abdominal pain: Secondary | ICD-10-CM | POA: Diagnosis not present

## 2023-09-02 DIAGNOSIS — M6281 Muscle weakness (generalized): Secondary | ICD-10-CM | POA: Diagnosis not present

## 2023-09-02 DIAGNOSIS — E785 Hyperlipidemia, unspecified: Secondary | ICD-10-CM | POA: Diagnosis not present

## 2023-09-02 DIAGNOSIS — J449 Chronic obstructive pulmonary disease, unspecified: Secondary | ICD-10-CM | POA: Diagnosis not present

## 2023-09-02 DIAGNOSIS — M25512 Pain in left shoulder: Secondary | ICD-10-CM | POA: Diagnosis not present

## 2023-09-04 DIAGNOSIS — M25512 Pain in left shoulder: Secondary | ICD-10-CM | POA: Diagnosis not present

## 2023-09-04 DIAGNOSIS — M6281 Muscle weakness (generalized): Secondary | ICD-10-CM | POA: Diagnosis not present

## 2023-09-04 DIAGNOSIS — E785 Hyperlipidemia, unspecified: Secondary | ICD-10-CM | POA: Diagnosis not present

## 2023-09-04 DIAGNOSIS — J449 Chronic obstructive pulmonary disease, unspecified: Secondary | ICD-10-CM | POA: Diagnosis not present

## 2023-09-05 DIAGNOSIS — E1140 Type 2 diabetes mellitus with diabetic neuropathy, unspecified: Secondary | ICD-10-CM | POA: Diagnosis not present

## 2023-09-05 DIAGNOSIS — E785 Hyperlipidemia, unspecified: Secondary | ICD-10-CM | POA: Diagnosis not present

## 2023-09-05 DIAGNOSIS — M25512 Pain in left shoulder: Secondary | ICD-10-CM | POA: Diagnosis not present

## 2023-09-05 DIAGNOSIS — M6281 Muscle weakness (generalized): Secondary | ICD-10-CM | POA: Diagnosis not present

## 2023-09-05 DIAGNOSIS — J449 Chronic obstructive pulmonary disease, unspecified: Secondary | ICD-10-CM | POA: Diagnosis not present

## 2023-09-06 DIAGNOSIS — E119 Type 2 diabetes mellitus without complications: Secondary | ICD-10-CM | POA: Diagnosis not present

## 2023-09-06 DIAGNOSIS — M25512 Pain in left shoulder: Secondary | ICD-10-CM | POA: Diagnosis not present

## 2023-09-06 DIAGNOSIS — M6281 Muscle weakness (generalized): Secondary | ICD-10-CM | POA: Diagnosis not present

## 2023-09-06 DIAGNOSIS — J449 Chronic obstructive pulmonary disease, unspecified: Secondary | ICD-10-CM | POA: Diagnosis not present

## 2023-09-06 DIAGNOSIS — E785 Hyperlipidemia, unspecified: Secondary | ICD-10-CM | POA: Diagnosis not present

## 2023-09-07 DIAGNOSIS — M6281 Muscle weakness (generalized): Secondary | ICD-10-CM | POA: Diagnosis not present

## 2023-09-07 DIAGNOSIS — M25512 Pain in left shoulder: Secondary | ICD-10-CM | POA: Diagnosis not present

## 2023-09-07 DIAGNOSIS — E785 Hyperlipidemia, unspecified: Secondary | ICD-10-CM | POA: Diagnosis not present

## 2023-09-07 DIAGNOSIS — J449 Chronic obstructive pulmonary disease, unspecified: Secondary | ICD-10-CM | POA: Diagnosis not present

## 2023-09-08 DIAGNOSIS — M25512 Pain in left shoulder: Secondary | ICD-10-CM | POA: Diagnosis not present

## 2023-09-08 DIAGNOSIS — J449 Chronic obstructive pulmonary disease, unspecified: Secondary | ICD-10-CM | POA: Diagnosis not present

## 2023-09-08 DIAGNOSIS — E785 Hyperlipidemia, unspecified: Secondary | ICD-10-CM | POA: Diagnosis not present

## 2023-09-08 DIAGNOSIS — M6281 Muscle weakness (generalized): Secondary | ICD-10-CM | POA: Diagnosis not present

## 2023-09-08 DIAGNOSIS — E119 Type 2 diabetes mellitus without complications: Secondary | ICD-10-CM | POA: Diagnosis not present

## 2023-09-09 DIAGNOSIS — M6281 Muscle weakness (generalized): Secondary | ICD-10-CM | POA: Diagnosis not present

## 2023-09-09 DIAGNOSIS — J449 Chronic obstructive pulmonary disease, unspecified: Secondary | ICD-10-CM | POA: Diagnosis not present

## 2023-09-09 DIAGNOSIS — E785 Hyperlipidemia, unspecified: Secondary | ICD-10-CM | POA: Diagnosis not present

## 2023-09-09 DIAGNOSIS — M25512 Pain in left shoulder: Secondary | ICD-10-CM | POA: Diagnosis not present

## 2023-09-10 DIAGNOSIS — E785 Hyperlipidemia, unspecified: Secondary | ICD-10-CM | POA: Diagnosis not present

## 2023-09-10 DIAGNOSIS — J449 Chronic obstructive pulmonary disease, unspecified: Secondary | ICD-10-CM | POA: Diagnosis not present

## 2023-09-10 DIAGNOSIS — M25512 Pain in left shoulder: Secondary | ICD-10-CM | POA: Diagnosis not present

## 2023-09-10 DIAGNOSIS — M6281 Muscle weakness (generalized): Secondary | ICD-10-CM | POA: Diagnosis not present

## 2023-09-11 DIAGNOSIS — M25512 Pain in left shoulder: Secondary | ICD-10-CM | POA: Diagnosis not present

## 2023-09-11 DIAGNOSIS — J449 Chronic obstructive pulmonary disease, unspecified: Secondary | ICD-10-CM | POA: Diagnosis not present

## 2023-09-11 DIAGNOSIS — M6281 Muscle weakness (generalized): Secondary | ICD-10-CM | POA: Diagnosis not present

## 2023-09-11 DIAGNOSIS — E785 Hyperlipidemia, unspecified: Secondary | ICD-10-CM | POA: Diagnosis not present

## 2023-09-12 DIAGNOSIS — M6281 Muscle weakness (generalized): Secondary | ICD-10-CM | POA: Diagnosis not present

## 2023-09-12 DIAGNOSIS — E785 Hyperlipidemia, unspecified: Secondary | ICD-10-CM | POA: Diagnosis not present

## 2023-09-12 DIAGNOSIS — M25512 Pain in left shoulder: Secondary | ICD-10-CM | POA: Diagnosis not present

## 2023-09-12 DIAGNOSIS — J449 Chronic obstructive pulmonary disease, unspecified: Secondary | ICD-10-CM | POA: Diagnosis not present

## 2023-09-16 DIAGNOSIS — M6281 Muscle weakness (generalized): Secondary | ICD-10-CM | POA: Diagnosis not present

## 2023-09-16 DIAGNOSIS — E785 Hyperlipidemia, unspecified: Secondary | ICD-10-CM | POA: Diagnosis not present

## 2023-09-16 DIAGNOSIS — J449 Chronic obstructive pulmonary disease, unspecified: Secondary | ICD-10-CM | POA: Diagnosis not present

## 2023-09-16 DIAGNOSIS — M25512 Pain in left shoulder: Secondary | ICD-10-CM | POA: Diagnosis not present

## 2023-09-18 DIAGNOSIS — M6281 Muscle weakness (generalized): Secondary | ICD-10-CM | POA: Diagnosis not present

## 2023-09-18 DIAGNOSIS — M25512 Pain in left shoulder: Secondary | ICD-10-CM | POA: Diagnosis not present

## 2023-09-18 DIAGNOSIS — E785 Hyperlipidemia, unspecified: Secondary | ICD-10-CM | POA: Diagnosis not present

## 2023-09-18 DIAGNOSIS — J449 Chronic obstructive pulmonary disease, unspecified: Secondary | ICD-10-CM | POA: Diagnosis not present

## 2023-09-22 DIAGNOSIS — E785 Hyperlipidemia, unspecified: Secondary | ICD-10-CM | POA: Diagnosis not present

## 2023-09-22 DIAGNOSIS — I10 Essential (primary) hypertension: Secondary | ICD-10-CM | POA: Diagnosis not present

## 2023-10-01 DIAGNOSIS — E785 Hyperlipidemia, unspecified: Secondary | ICD-10-CM | POA: Diagnosis not present

## 2023-10-10 DIAGNOSIS — E1165 Type 2 diabetes mellitus with hyperglycemia: Secondary | ICD-10-CM | POA: Diagnosis not present

## 2023-10-10 DIAGNOSIS — I10 Essential (primary) hypertension: Secondary | ICD-10-CM | POA: Diagnosis not present

## 2023-10-10 DIAGNOSIS — E785 Hyperlipidemia, unspecified: Secondary | ICD-10-CM | POA: Diagnosis not present

## 2023-10-13 DIAGNOSIS — G894 Chronic pain syndrome: Secondary | ICD-10-CM | POA: Diagnosis not present

## 2023-10-13 DIAGNOSIS — W19XXXA Unspecified fall, initial encounter: Secondary | ICD-10-CM | POA: Diagnosis not present

## 2023-10-13 DIAGNOSIS — E559 Vitamin D deficiency, unspecified: Secondary | ICD-10-CM | POA: Diagnosis not present

## 2023-10-15 DIAGNOSIS — R2689 Other abnormalities of gait and mobility: Secondary | ICD-10-CM | POA: Diagnosis not present

## 2023-10-15 DIAGNOSIS — R2681 Unsteadiness on feet: Secondary | ICD-10-CM | POA: Diagnosis not present

## 2023-10-15 DIAGNOSIS — N189 Chronic kidney disease, unspecified: Secondary | ICD-10-CM | POA: Diagnosis not present

## 2023-10-16 DIAGNOSIS — R2681 Unsteadiness on feet: Secondary | ICD-10-CM | POA: Diagnosis not present

## 2023-10-16 DIAGNOSIS — R2689 Other abnormalities of gait and mobility: Secondary | ICD-10-CM | POA: Diagnosis not present

## 2023-10-16 DIAGNOSIS — N189 Chronic kidney disease, unspecified: Secondary | ICD-10-CM | POA: Diagnosis not present

## 2023-10-20 DIAGNOSIS — N189 Chronic kidney disease, unspecified: Secondary | ICD-10-CM | POA: Diagnosis not present

## 2023-10-20 DIAGNOSIS — R2689 Other abnormalities of gait and mobility: Secondary | ICD-10-CM | POA: Diagnosis not present

## 2023-10-20 DIAGNOSIS — R2681 Unsteadiness on feet: Secondary | ICD-10-CM | POA: Diagnosis not present

## 2023-10-21 DIAGNOSIS — R2681 Unsteadiness on feet: Secondary | ICD-10-CM | POA: Diagnosis not present

## 2023-10-21 DIAGNOSIS — W19XXXA Unspecified fall, initial encounter: Secondary | ICD-10-CM | POA: Diagnosis not present

## 2023-10-21 DIAGNOSIS — N189 Chronic kidney disease, unspecified: Secondary | ICD-10-CM | POA: Diagnosis not present

## 2023-10-21 DIAGNOSIS — R2689 Other abnormalities of gait and mobility: Secondary | ICD-10-CM | POA: Diagnosis not present

## 2023-10-21 DIAGNOSIS — G894 Chronic pain syndrome: Secondary | ICD-10-CM | POA: Diagnosis not present

## 2023-10-22 DIAGNOSIS — R2689 Other abnormalities of gait and mobility: Secondary | ICD-10-CM | POA: Diagnosis not present

## 2023-10-22 DIAGNOSIS — N189 Chronic kidney disease, unspecified: Secondary | ICD-10-CM | POA: Diagnosis not present

## 2023-10-22 DIAGNOSIS — R2681 Unsteadiness on feet: Secondary | ICD-10-CM | POA: Diagnosis not present

## 2023-10-23 DIAGNOSIS — R2681 Unsteadiness on feet: Secondary | ICD-10-CM | POA: Diagnosis not present

## 2023-10-23 DIAGNOSIS — N189 Chronic kidney disease, unspecified: Secondary | ICD-10-CM | POA: Diagnosis not present

## 2023-10-23 DIAGNOSIS — R2689 Other abnormalities of gait and mobility: Secondary | ICD-10-CM | POA: Diagnosis not present

## 2023-10-24 DIAGNOSIS — R2689 Other abnormalities of gait and mobility: Secondary | ICD-10-CM | POA: Diagnosis not present

## 2023-10-24 DIAGNOSIS — R2681 Unsteadiness on feet: Secondary | ICD-10-CM | POA: Diagnosis not present

## 2023-10-24 DIAGNOSIS — N189 Chronic kidney disease, unspecified: Secondary | ICD-10-CM | POA: Diagnosis not present

## 2023-10-27 DIAGNOSIS — R2681 Unsteadiness on feet: Secondary | ICD-10-CM | POA: Diagnosis not present

## 2023-10-27 DIAGNOSIS — R2689 Other abnormalities of gait and mobility: Secondary | ICD-10-CM | POA: Diagnosis not present

## 2023-10-27 DIAGNOSIS — N189 Chronic kidney disease, unspecified: Secondary | ICD-10-CM | POA: Diagnosis not present

## 2023-10-28 DIAGNOSIS — R2689 Other abnormalities of gait and mobility: Secondary | ICD-10-CM | POA: Diagnosis not present

## 2023-10-28 DIAGNOSIS — R2681 Unsteadiness on feet: Secondary | ICD-10-CM | POA: Diagnosis not present

## 2023-10-28 DIAGNOSIS — N189 Chronic kidney disease, unspecified: Secondary | ICD-10-CM | POA: Diagnosis not present

## 2023-10-29 DIAGNOSIS — N189 Chronic kidney disease, unspecified: Secondary | ICD-10-CM | POA: Diagnosis not present

## 2023-10-29 DIAGNOSIS — R2681 Unsteadiness on feet: Secondary | ICD-10-CM | POA: Diagnosis not present

## 2023-10-29 DIAGNOSIS — R2689 Other abnormalities of gait and mobility: Secondary | ICD-10-CM | POA: Diagnosis not present

## 2023-10-30 DIAGNOSIS — R2689 Other abnormalities of gait and mobility: Secondary | ICD-10-CM | POA: Diagnosis not present

## 2023-10-30 DIAGNOSIS — N189 Chronic kidney disease, unspecified: Secondary | ICD-10-CM | POA: Diagnosis not present

## 2023-10-30 DIAGNOSIS — R2681 Unsteadiness on feet: Secondary | ICD-10-CM | POA: Diagnosis not present

## 2023-10-31 DIAGNOSIS — R2689 Other abnormalities of gait and mobility: Secondary | ICD-10-CM | POA: Diagnosis not present

## 2023-10-31 DIAGNOSIS — R2681 Unsteadiness on feet: Secondary | ICD-10-CM | POA: Diagnosis not present

## 2023-10-31 DIAGNOSIS — N189 Chronic kidney disease, unspecified: Secondary | ICD-10-CM | POA: Diagnosis not present

## 2023-11-03 DIAGNOSIS — R2689 Other abnormalities of gait and mobility: Secondary | ICD-10-CM | POA: Diagnosis not present

## 2023-11-03 DIAGNOSIS — R2681 Unsteadiness on feet: Secondary | ICD-10-CM | POA: Diagnosis not present

## 2023-11-03 DIAGNOSIS — N189 Chronic kidney disease, unspecified: Secondary | ICD-10-CM | POA: Diagnosis not present

## 2023-12-09 DIAGNOSIS — E1151 Type 2 diabetes mellitus with diabetic peripheral angiopathy without gangrene: Secondary | ICD-10-CM | POA: Diagnosis not present

## 2023-12-09 DIAGNOSIS — L84 Corns and callosities: Secondary | ICD-10-CM | POA: Diagnosis not present

## 2023-12-09 DIAGNOSIS — L603 Nail dystrophy: Secondary | ICD-10-CM | POA: Diagnosis not present

## 2023-12-09 DIAGNOSIS — L602 Onychogryphosis: Secondary | ICD-10-CM | POA: Diagnosis not present

## 2023-12-09 DIAGNOSIS — Z794 Long term (current) use of insulin: Secondary | ICD-10-CM | POA: Diagnosis not present

## 2023-12-31 DIAGNOSIS — E785 Hyperlipidemia, unspecified: Secondary | ICD-10-CM | POA: Diagnosis not present

## 2023-12-31 DIAGNOSIS — I509 Heart failure, unspecified: Secondary | ICD-10-CM | POA: Diagnosis not present

## 2023-12-31 DIAGNOSIS — E1140 Type 2 diabetes mellitus with diabetic neuropathy, unspecified: Secondary | ICD-10-CM | POA: Diagnosis not present

## 2024-01-06 DIAGNOSIS — M6281 Muscle weakness (generalized): Secondary | ICD-10-CM | POA: Diagnosis not present

## 2024-01-06 DIAGNOSIS — J449 Chronic obstructive pulmonary disease, unspecified: Secondary | ICD-10-CM | POA: Diagnosis not present

## 2024-01-06 DIAGNOSIS — M25512 Pain in left shoulder: Secondary | ICD-10-CM | POA: Diagnosis not present

## 2024-01-06 DIAGNOSIS — R1310 Dysphagia, unspecified: Secondary | ICD-10-CM | POA: Diagnosis not present

## 2024-01-06 DIAGNOSIS — E785 Hyperlipidemia, unspecified: Secondary | ICD-10-CM | POA: Diagnosis not present

## 2024-01-06 DIAGNOSIS — R2681 Unsteadiness on feet: Secondary | ICD-10-CM | POA: Diagnosis not present

## 2024-01-06 DIAGNOSIS — M25511 Pain in right shoulder: Secondary | ICD-10-CM | POA: Diagnosis not present

## 2024-01-08 DIAGNOSIS — J449 Chronic obstructive pulmonary disease, unspecified: Secondary | ICD-10-CM | POA: Diagnosis not present

## 2024-01-08 DIAGNOSIS — E785 Hyperlipidemia, unspecified: Secondary | ICD-10-CM | POA: Diagnosis not present

## 2024-01-08 DIAGNOSIS — R2681 Unsteadiness on feet: Secondary | ICD-10-CM | POA: Diagnosis not present

## 2024-01-08 DIAGNOSIS — M25512 Pain in left shoulder: Secondary | ICD-10-CM | POA: Diagnosis not present

## 2024-01-08 DIAGNOSIS — M6281 Muscle weakness (generalized): Secondary | ICD-10-CM | POA: Diagnosis not present

## 2024-01-08 DIAGNOSIS — R1310 Dysphagia, unspecified: Secondary | ICD-10-CM | POA: Diagnosis not present

## 2024-01-08 DIAGNOSIS — M25511 Pain in right shoulder: Secondary | ICD-10-CM | POA: Diagnosis not present

## 2024-01-09 DIAGNOSIS — M25512 Pain in left shoulder: Secondary | ICD-10-CM | POA: Diagnosis not present

## 2024-01-09 DIAGNOSIS — E785 Hyperlipidemia, unspecified: Secondary | ICD-10-CM | POA: Diagnosis not present

## 2024-01-09 DIAGNOSIS — M6281 Muscle weakness (generalized): Secondary | ICD-10-CM | POA: Diagnosis not present

## 2024-01-09 DIAGNOSIS — R1310 Dysphagia, unspecified: Secondary | ICD-10-CM | POA: Diagnosis not present

## 2024-01-09 DIAGNOSIS — J449 Chronic obstructive pulmonary disease, unspecified: Secondary | ICD-10-CM | POA: Diagnosis not present

## 2024-01-09 DIAGNOSIS — M25511 Pain in right shoulder: Secondary | ICD-10-CM | POA: Diagnosis not present

## 2024-01-09 DIAGNOSIS — R2681 Unsteadiness on feet: Secondary | ICD-10-CM | POA: Diagnosis not present

## 2024-01-12 DIAGNOSIS — J449 Chronic obstructive pulmonary disease, unspecified: Secondary | ICD-10-CM | POA: Diagnosis not present

## 2024-01-12 DIAGNOSIS — E785 Hyperlipidemia, unspecified: Secondary | ICD-10-CM | POA: Diagnosis not present

## 2024-01-12 DIAGNOSIS — M25512 Pain in left shoulder: Secondary | ICD-10-CM | POA: Diagnosis not present

## 2024-01-12 DIAGNOSIS — R2681 Unsteadiness on feet: Secondary | ICD-10-CM | POA: Diagnosis not present

## 2024-01-12 DIAGNOSIS — M25511 Pain in right shoulder: Secondary | ICD-10-CM | POA: Diagnosis not present

## 2024-01-12 DIAGNOSIS — M6281 Muscle weakness (generalized): Secondary | ICD-10-CM | POA: Diagnosis not present

## 2024-01-12 DIAGNOSIS — R1310 Dysphagia, unspecified: Secondary | ICD-10-CM | POA: Diagnosis not present

## 2024-01-13 DIAGNOSIS — M25512 Pain in left shoulder: Secondary | ICD-10-CM | POA: Diagnosis not present

## 2024-01-13 DIAGNOSIS — R2681 Unsteadiness on feet: Secondary | ICD-10-CM | POA: Diagnosis not present

## 2024-01-13 DIAGNOSIS — E785 Hyperlipidemia, unspecified: Secondary | ICD-10-CM | POA: Diagnosis not present

## 2024-01-13 DIAGNOSIS — M25511 Pain in right shoulder: Secondary | ICD-10-CM | POA: Diagnosis not present

## 2024-01-13 DIAGNOSIS — M6281 Muscle weakness (generalized): Secondary | ICD-10-CM | POA: Diagnosis not present

## 2024-01-13 DIAGNOSIS — R1310 Dysphagia, unspecified: Secondary | ICD-10-CM | POA: Diagnosis not present

## 2024-01-13 DIAGNOSIS — J449 Chronic obstructive pulmonary disease, unspecified: Secondary | ICD-10-CM | POA: Diagnosis not present

## 2024-01-14 DIAGNOSIS — R1310 Dysphagia, unspecified: Secondary | ICD-10-CM | POA: Diagnosis not present

## 2024-01-14 DIAGNOSIS — M25512 Pain in left shoulder: Secondary | ICD-10-CM | POA: Diagnosis not present

## 2024-01-14 DIAGNOSIS — M25511 Pain in right shoulder: Secondary | ICD-10-CM | POA: Diagnosis not present

## 2024-01-14 DIAGNOSIS — J449 Chronic obstructive pulmonary disease, unspecified: Secondary | ICD-10-CM | POA: Diagnosis not present

## 2024-01-14 DIAGNOSIS — M6281 Muscle weakness (generalized): Secondary | ICD-10-CM | POA: Diagnosis not present

## 2024-01-14 DIAGNOSIS — E785 Hyperlipidemia, unspecified: Secondary | ICD-10-CM | POA: Diagnosis not present

## 2024-01-14 DIAGNOSIS — R2681 Unsteadiness on feet: Secondary | ICD-10-CM | POA: Diagnosis not present

## 2024-01-15 DIAGNOSIS — M6281 Muscle weakness (generalized): Secondary | ICD-10-CM | POA: Diagnosis not present

## 2024-01-15 DIAGNOSIS — E785 Hyperlipidemia, unspecified: Secondary | ICD-10-CM | POA: Diagnosis not present

## 2024-01-15 DIAGNOSIS — R2681 Unsteadiness on feet: Secondary | ICD-10-CM | POA: Diagnosis not present

## 2024-01-15 DIAGNOSIS — M25511 Pain in right shoulder: Secondary | ICD-10-CM | POA: Diagnosis not present

## 2024-01-15 DIAGNOSIS — R1310 Dysphagia, unspecified: Secondary | ICD-10-CM | POA: Diagnosis not present

## 2024-01-15 DIAGNOSIS — M25512 Pain in left shoulder: Secondary | ICD-10-CM | POA: Diagnosis not present

## 2024-01-15 DIAGNOSIS — J449 Chronic obstructive pulmonary disease, unspecified: Secondary | ICD-10-CM | POA: Diagnosis not present

## 2024-01-17 DIAGNOSIS — J449 Chronic obstructive pulmonary disease, unspecified: Secondary | ICD-10-CM | POA: Diagnosis not present

## 2024-01-17 DIAGNOSIS — R1310 Dysphagia, unspecified: Secondary | ICD-10-CM | POA: Diagnosis not present

## 2024-01-17 DIAGNOSIS — M25511 Pain in right shoulder: Secondary | ICD-10-CM | POA: Diagnosis not present

## 2024-01-17 DIAGNOSIS — M25512 Pain in left shoulder: Secondary | ICD-10-CM | POA: Diagnosis not present

## 2024-01-17 DIAGNOSIS — E785 Hyperlipidemia, unspecified: Secondary | ICD-10-CM | POA: Diagnosis not present

## 2024-01-17 DIAGNOSIS — R2681 Unsteadiness on feet: Secondary | ICD-10-CM | POA: Diagnosis not present

## 2024-01-17 DIAGNOSIS — M6281 Muscle weakness (generalized): Secondary | ICD-10-CM | POA: Diagnosis not present

## 2024-01-19 DIAGNOSIS — J449 Chronic obstructive pulmonary disease, unspecified: Secondary | ICD-10-CM | POA: Diagnosis not present

## 2024-01-19 DIAGNOSIS — M25511 Pain in right shoulder: Secondary | ICD-10-CM | POA: Diagnosis not present

## 2024-01-19 DIAGNOSIS — M6281 Muscle weakness (generalized): Secondary | ICD-10-CM | POA: Diagnosis not present

## 2024-01-19 DIAGNOSIS — R1310 Dysphagia, unspecified: Secondary | ICD-10-CM | POA: Diagnosis not present

## 2024-01-19 DIAGNOSIS — M25512 Pain in left shoulder: Secondary | ICD-10-CM | POA: Diagnosis not present

## 2024-01-19 DIAGNOSIS — E785 Hyperlipidemia, unspecified: Secondary | ICD-10-CM | POA: Diagnosis not present

## 2024-01-19 DIAGNOSIS — R2681 Unsteadiness on feet: Secondary | ICD-10-CM | POA: Diagnosis not present

## 2024-01-20 DIAGNOSIS — J449 Chronic obstructive pulmonary disease, unspecified: Secondary | ICD-10-CM | POA: Diagnosis not present

## 2024-01-20 DIAGNOSIS — E785 Hyperlipidemia, unspecified: Secondary | ICD-10-CM | POA: Diagnosis not present

## 2024-01-20 DIAGNOSIS — R2681 Unsteadiness on feet: Secondary | ICD-10-CM | POA: Diagnosis not present

## 2024-01-20 DIAGNOSIS — M25512 Pain in left shoulder: Secondary | ICD-10-CM | POA: Diagnosis not present

## 2024-01-20 DIAGNOSIS — M6281 Muscle weakness (generalized): Secondary | ICD-10-CM | POA: Diagnosis not present

## 2024-01-20 DIAGNOSIS — R1310 Dysphagia, unspecified: Secondary | ICD-10-CM | POA: Diagnosis not present

## 2024-01-20 DIAGNOSIS — M25511 Pain in right shoulder: Secondary | ICD-10-CM | POA: Diagnosis not present

## 2024-01-21 DIAGNOSIS — R2681 Unsteadiness on feet: Secondary | ICD-10-CM | POA: Diagnosis not present

## 2024-01-21 DIAGNOSIS — R1310 Dysphagia, unspecified: Secondary | ICD-10-CM | POA: Diagnosis not present

## 2024-01-21 DIAGNOSIS — E785 Hyperlipidemia, unspecified: Secondary | ICD-10-CM | POA: Diagnosis not present

## 2024-01-21 DIAGNOSIS — M25512 Pain in left shoulder: Secondary | ICD-10-CM | POA: Diagnosis not present

## 2024-01-21 DIAGNOSIS — M25511 Pain in right shoulder: Secondary | ICD-10-CM | POA: Diagnosis not present

## 2024-01-21 DIAGNOSIS — J449 Chronic obstructive pulmonary disease, unspecified: Secondary | ICD-10-CM | POA: Diagnosis not present

## 2024-01-21 DIAGNOSIS — M6281 Muscle weakness (generalized): Secondary | ICD-10-CM | POA: Diagnosis not present

## 2024-01-22 DIAGNOSIS — R2681 Unsteadiness on feet: Secondary | ICD-10-CM | POA: Diagnosis not present

## 2024-01-22 DIAGNOSIS — M25511 Pain in right shoulder: Secondary | ICD-10-CM | POA: Diagnosis not present

## 2024-01-22 DIAGNOSIS — E785 Hyperlipidemia, unspecified: Secondary | ICD-10-CM | POA: Diagnosis not present

## 2024-01-22 DIAGNOSIS — M6281 Muscle weakness (generalized): Secondary | ICD-10-CM | POA: Diagnosis not present

## 2024-01-22 DIAGNOSIS — J449 Chronic obstructive pulmonary disease, unspecified: Secondary | ICD-10-CM | POA: Diagnosis not present

## 2024-01-22 DIAGNOSIS — M25512 Pain in left shoulder: Secondary | ICD-10-CM | POA: Diagnosis not present

## 2024-01-22 DIAGNOSIS — R1310 Dysphagia, unspecified: Secondary | ICD-10-CM | POA: Diagnosis not present

## 2024-01-23 DIAGNOSIS — J449 Chronic obstructive pulmonary disease, unspecified: Secondary | ICD-10-CM | POA: Diagnosis not present

## 2024-01-23 DIAGNOSIS — M25512 Pain in left shoulder: Secondary | ICD-10-CM | POA: Diagnosis not present

## 2024-01-23 DIAGNOSIS — R1310 Dysphagia, unspecified: Secondary | ICD-10-CM | POA: Diagnosis not present

## 2024-01-23 DIAGNOSIS — M6281 Muscle weakness (generalized): Secondary | ICD-10-CM | POA: Diagnosis not present

## 2024-01-23 DIAGNOSIS — R2681 Unsteadiness on feet: Secondary | ICD-10-CM | POA: Diagnosis not present

## 2024-01-23 DIAGNOSIS — M25511 Pain in right shoulder: Secondary | ICD-10-CM | POA: Diagnosis not present

## 2024-01-23 DIAGNOSIS — E785 Hyperlipidemia, unspecified: Secondary | ICD-10-CM | POA: Diagnosis not present

## 2024-01-26 DIAGNOSIS — E1140 Type 2 diabetes mellitus with diabetic neuropathy, unspecified: Secondary | ICD-10-CM | POA: Diagnosis not present

## 2024-01-26 DIAGNOSIS — M6281 Muscle weakness (generalized): Secondary | ICD-10-CM | POA: Diagnosis not present

## 2024-01-26 DIAGNOSIS — J449 Chronic obstructive pulmonary disease, unspecified: Secondary | ICD-10-CM | POA: Diagnosis not present

## 2024-01-26 DIAGNOSIS — N189 Chronic kidney disease, unspecified: Secondary | ICD-10-CM | POA: Diagnosis not present

## 2024-01-26 DIAGNOSIS — R2681 Unsteadiness on feet: Secondary | ICD-10-CM | POA: Diagnosis not present

## 2024-01-26 DIAGNOSIS — I509 Heart failure, unspecified: Secondary | ICD-10-CM | POA: Diagnosis not present

## 2024-01-26 DIAGNOSIS — G894 Chronic pain syndrome: Secondary | ICD-10-CM | POA: Diagnosis not present

## 2024-01-27 DIAGNOSIS — R2681 Unsteadiness on feet: Secondary | ICD-10-CM | POA: Diagnosis not present

## 2024-01-27 DIAGNOSIS — E785 Hyperlipidemia, unspecified: Secondary | ICD-10-CM | POA: Diagnosis not present

## 2024-01-27 DIAGNOSIS — M6281 Muscle weakness (generalized): Secondary | ICD-10-CM | POA: Diagnosis not present

## 2024-01-27 DIAGNOSIS — I509 Heart failure, unspecified: Secondary | ICD-10-CM | POA: Diagnosis not present

## 2024-01-27 DIAGNOSIS — E1140 Type 2 diabetes mellitus with diabetic neuropathy, unspecified: Secondary | ICD-10-CM | POA: Diagnosis not present

## 2024-01-27 DIAGNOSIS — J449 Chronic obstructive pulmonary disease, unspecified: Secondary | ICD-10-CM | POA: Diagnosis not present

## 2024-01-28 DIAGNOSIS — R2681 Unsteadiness on feet: Secondary | ICD-10-CM | POA: Diagnosis not present

## 2024-01-28 DIAGNOSIS — J449 Chronic obstructive pulmonary disease, unspecified: Secondary | ICD-10-CM | POA: Diagnosis not present

## 2024-01-28 DIAGNOSIS — M6281 Muscle weakness (generalized): Secondary | ICD-10-CM | POA: Diagnosis not present

## 2024-01-28 DIAGNOSIS — E1140 Type 2 diabetes mellitus with diabetic neuropathy, unspecified: Secondary | ICD-10-CM | POA: Diagnosis not present

## 2024-01-29 DIAGNOSIS — M6281 Muscle weakness (generalized): Secondary | ICD-10-CM | POA: Diagnosis not present

## 2024-01-29 DIAGNOSIS — J449 Chronic obstructive pulmonary disease, unspecified: Secondary | ICD-10-CM | POA: Diagnosis not present

## 2024-01-29 DIAGNOSIS — E1140 Type 2 diabetes mellitus with diabetic neuropathy, unspecified: Secondary | ICD-10-CM | POA: Diagnosis not present

## 2024-01-29 DIAGNOSIS — R2681 Unsteadiness on feet: Secondary | ICD-10-CM | POA: Diagnosis not present

## 2024-01-30 DIAGNOSIS — E1140 Type 2 diabetes mellitus with diabetic neuropathy, unspecified: Secondary | ICD-10-CM | POA: Diagnosis not present

## 2024-01-30 DIAGNOSIS — J449 Chronic obstructive pulmonary disease, unspecified: Secondary | ICD-10-CM | POA: Diagnosis not present

## 2024-01-30 DIAGNOSIS — M6281 Muscle weakness (generalized): Secondary | ICD-10-CM | POA: Diagnosis not present

## 2024-01-30 DIAGNOSIS — R2681 Unsteadiness on feet: Secondary | ICD-10-CM | POA: Diagnosis not present

## 2024-02-02 DIAGNOSIS — E1140 Type 2 diabetes mellitus with diabetic neuropathy, unspecified: Secondary | ICD-10-CM | POA: Diagnosis not present

## 2024-02-02 DIAGNOSIS — M6281 Muscle weakness (generalized): Secondary | ICD-10-CM | POA: Diagnosis not present

## 2024-02-02 DIAGNOSIS — J449 Chronic obstructive pulmonary disease, unspecified: Secondary | ICD-10-CM | POA: Diagnosis not present

## 2024-02-02 DIAGNOSIS — R2681 Unsteadiness on feet: Secondary | ICD-10-CM | POA: Diagnosis not present

## 2024-02-03 DIAGNOSIS — J449 Chronic obstructive pulmonary disease, unspecified: Secondary | ICD-10-CM | POA: Diagnosis not present

## 2024-02-03 DIAGNOSIS — M6281 Muscle weakness (generalized): Secondary | ICD-10-CM | POA: Diagnosis not present

## 2024-02-03 DIAGNOSIS — R2681 Unsteadiness on feet: Secondary | ICD-10-CM | POA: Diagnosis not present

## 2024-02-03 DIAGNOSIS — E1140 Type 2 diabetes mellitus with diabetic neuropathy, unspecified: Secondary | ICD-10-CM | POA: Diagnosis not present

## 2024-02-04 DIAGNOSIS — E1140 Type 2 diabetes mellitus with diabetic neuropathy, unspecified: Secondary | ICD-10-CM | POA: Diagnosis not present

## 2024-02-04 DIAGNOSIS — R2681 Unsteadiness on feet: Secondary | ICD-10-CM | POA: Diagnosis not present

## 2024-02-04 DIAGNOSIS — J449 Chronic obstructive pulmonary disease, unspecified: Secondary | ICD-10-CM | POA: Diagnosis not present

## 2024-02-04 DIAGNOSIS — M6281 Muscle weakness (generalized): Secondary | ICD-10-CM | POA: Diagnosis not present

## 2024-02-05 DIAGNOSIS — M6281 Muscle weakness (generalized): Secondary | ICD-10-CM | POA: Diagnosis not present

## 2024-02-05 DIAGNOSIS — E1140 Type 2 diabetes mellitus with diabetic neuropathy, unspecified: Secondary | ICD-10-CM | POA: Diagnosis not present

## 2024-02-05 DIAGNOSIS — R2681 Unsteadiness on feet: Secondary | ICD-10-CM | POA: Diagnosis not present

## 2024-02-05 DIAGNOSIS — J449 Chronic obstructive pulmonary disease, unspecified: Secondary | ICD-10-CM | POA: Diagnosis not present

## 2024-02-09 DIAGNOSIS — E1140 Type 2 diabetes mellitus with diabetic neuropathy, unspecified: Secondary | ICD-10-CM | POA: Diagnosis not present

## 2024-02-09 DIAGNOSIS — J449 Chronic obstructive pulmonary disease, unspecified: Secondary | ICD-10-CM | POA: Diagnosis not present

## 2024-02-09 DIAGNOSIS — R2681 Unsteadiness on feet: Secondary | ICD-10-CM | POA: Diagnosis not present

## 2024-02-09 DIAGNOSIS — M6281 Muscle weakness (generalized): Secondary | ICD-10-CM | POA: Diagnosis not present

## 2024-02-10 DIAGNOSIS — R2681 Unsteadiness on feet: Secondary | ICD-10-CM | POA: Diagnosis not present

## 2024-02-10 DIAGNOSIS — E1140 Type 2 diabetes mellitus with diabetic neuropathy, unspecified: Secondary | ICD-10-CM | POA: Diagnosis not present

## 2024-02-10 DIAGNOSIS — J449 Chronic obstructive pulmonary disease, unspecified: Secondary | ICD-10-CM | POA: Diagnosis not present

## 2024-02-10 DIAGNOSIS — M6281 Muscle weakness (generalized): Secondary | ICD-10-CM | POA: Diagnosis not present

## 2024-02-11 DIAGNOSIS — R2681 Unsteadiness on feet: Secondary | ICD-10-CM | POA: Diagnosis not present

## 2024-02-11 DIAGNOSIS — E1140 Type 2 diabetes mellitus with diabetic neuropathy, unspecified: Secondary | ICD-10-CM | POA: Diagnosis not present

## 2024-02-11 DIAGNOSIS — J449 Chronic obstructive pulmonary disease, unspecified: Secondary | ICD-10-CM | POA: Diagnosis not present

## 2024-02-11 DIAGNOSIS — M6281 Muscle weakness (generalized): Secondary | ICD-10-CM | POA: Diagnosis not present

## 2024-02-12 DIAGNOSIS — E1140 Type 2 diabetes mellitus with diabetic neuropathy, unspecified: Secondary | ICD-10-CM | POA: Diagnosis not present

## 2024-02-12 DIAGNOSIS — M6281 Muscle weakness (generalized): Secondary | ICD-10-CM | POA: Diagnosis not present

## 2024-02-12 DIAGNOSIS — R2681 Unsteadiness on feet: Secondary | ICD-10-CM | POA: Diagnosis not present

## 2024-02-12 DIAGNOSIS — J449 Chronic obstructive pulmonary disease, unspecified: Secondary | ICD-10-CM | POA: Diagnosis not present

## 2024-02-13 DIAGNOSIS — R2681 Unsteadiness on feet: Secondary | ICD-10-CM | POA: Diagnosis not present

## 2024-02-13 DIAGNOSIS — J449 Chronic obstructive pulmonary disease, unspecified: Secondary | ICD-10-CM | POA: Diagnosis not present

## 2024-02-13 DIAGNOSIS — E1140 Type 2 diabetes mellitus with diabetic neuropathy, unspecified: Secondary | ICD-10-CM | POA: Diagnosis not present

## 2024-02-13 DIAGNOSIS — M6281 Muscle weakness (generalized): Secondary | ICD-10-CM | POA: Diagnosis not present

## 2024-02-26 DIAGNOSIS — G894 Chronic pain syndrome: Secondary | ICD-10-CM | POA: Diagnosis not present

## 2024-02-26 DIAGNOSIS — I509 Heart failure, unspecified: Secondary | ICD-10-CM | POA: Diagnosis not present

## 2024-02-26 DIAGNOSIS — J449 Chronic obstructive pulmonary disease, unspecified: Secondary | ICD-10-CM | POA: Diagnosis not present

## 2024-02-26 DIAGNOSIS — E1140 Type 2 diabetes mellitus with diabetic neuropathy, unspecified: Secondary | ICD-10-CM | POA: Diagnosis not present

## 2024-03-02 DIAGNOSIS — H524 Presbyopia: Secondary | ICD-10-CM | POA: Diagnosis not present

## 2024-03-02 DIAGNOSIS — E119 Type 2 diabetes mellitus without complications: Secondary | ICD-10-CM | POA: Diagnosis not present

## 2024-03-02 DIAGNOSIS — Z961 Presence of intraocular lens: Secondary | ICD-10-CM | POA: Diagnosis not present

## 2024-03-15 DIAGNOSIS — N189 Chronic kidney disease, unspecified: Secondary | ICD-10-CM | POA: Diagnosis not present

## 2024-03-15 DIAGNOSIS — J449 Chronic obstructive pulmonary disease, unspecified: Secondary | ICD-10-CM | POA: Diagnosis not present

## 2024-03-15 DIAGNOSIS — E1140 Type 2 diabetes mellitus with diabetic neuropathy, unspecified: Secondary | ICD-10-CM | POA: Diagnosis not present

## 2024-04-20 DIAGNOSIS — I10 Essential (primary) hypertension: Secondary | ICD-10-CM | POA: Diagnosis not present

## 2024-04-20 DIAGNOSIS — E119 Type 2 diabetes mellitus without complications: Secondary | ICD-10-CM | POA: Diagnosis not present

## 2024-04-22 DIAGNOSIS — J449 Chronic obstructive pulmonary disease, unspecified: Secondary | ICD-10-CM | POA: Diagnosis not present

## 2024-04-23 DIAGNOSIS — J449 Chronic obstructive pulmonary disease, unspecified: Secondary | ICD-10-CM | POA: Diagnosis not present

## 2024-04-26 DIAGNOSIS — M25611 Stiffness of right shoulder, not elsewhere classified: Secondary | ICD-10-CM | POA: Diagnosis not present

## 2024-04-26 DIAGNOSIS — M25612 Stiffness of left shoulder, not elsewhere classified: Secondary | ICD-10-CM | POA: Diagnosis not present

## 2024-04-26 DIAGNOSIS — J449 Chronic obstructive pulmonary disease, unspecified: Secondary | ICD-10-CM | POA: Diagnosis not present

## 2024-04-27 DIAGNOSIS — M25611 Stiffness of right shoulder, not elsewhere classified: Secondary | ICD-10-CM | POA: Diagnosis not present

## 2024-04-27 DIAGNOSIS — M25612 Stiffness of left shoulder, not elsewhere classified: Secondary | ICD-10-CM | POA: Diagnosis not present

## 2024-04-27 DIAGNOSIS — J449 Chronic obstructive pulmonary disease, unspecified: Secondary | ICD-10-CM | POA: Diagnosis not present

## 2024-04-28 DIAGNOSIS — M25612 Stiffness of left shoulder, not elsewhere classified: Secondary | ICD-10-CM | POA: Diagnosis not present

## 2024-04-28 DIAGNOSIS — M25611 Stiffness of right shoulder, not elsewhere classified: Secondary | ICD-10-CM | POA: Diagnosis not present

## 2024-04-28 DIAGNOSIS — J449 Chronic obstructive pulmonary disease, unspecified: Secondary | ICD-10-CM | POA: Diagnosis not present

## 2024-04-29 DIAGNOSIS — M25612 Stiffness of left shoulder, not elsewhere classified: Secondary | ICD-10-CM | POA: Diagnosis not present

## 2024-04-29 DIAGNOSIS — M25611 Stiffness of right shoulder, not elsewhere classified: Secondary | ICD-10-CM | POA: Diagnosis not present

## 2024-04-29 DIAGNOSIS — J449 Chronic obstructive pulmonary disease, unspecified: Secondary | ICD-10-CM | POA: Diagnosis not present

## 2024-04-30 DIAGNOSIS — J449 Chronic obstructive pulmonary disease, unspecified: Secondary | ICD-10-CM | POA: Diagnosis not present

## 2024-04-30 DIAGNOSIS — M25611 Stiffness of right shoulder, not elsewhere classified: Secondary | ICD-10-CM | POA: Diagnosis not present

## 2024-04-30 DIAGNOSIS — M25612 Stiffness of left shoulder, not elsewhere classified: Secondary | ICD-10-CM | POA: Diagnosis not present

## 2024-05-03 DIAGNOSIS — J449 Chronic obstructive pulmonary disease, unspecified: Secondary | ICD-10-CM | POA: Diagnosis not present

## 2024-05-03 DIAGNOSIS — M25612 Stiffness of left shoulder, not elsewhere classified: Secondary | ICD-10-CM | POA: Diagnosis not present

## 2024-05-03 DIAGNOSIS — M25611 Stiffness of right shoulder, not elsewhere classified: Secondary | ICD-10-CM | POA: Diagnosis not present

## 2024-05-04 DIAGNOSIS — M25612 Stiffness of left shoulder, not elsewhere classified: Secondary | ICD-10-CM | POA: Diagnosis not present

## 2024-05-04 DIAGNOSIS — J449 Chronic obstructive pulmonary disease, unspecified: Secondary | ICD-10-CM | POA: Diagnosis not present

## 2024-05-04 DIAGNOSIS — M25611 Stiffness of right shoulder, not elsewhere classified: Secondary | ICD-10-CM | POA: Diagnosis not present

## 2024-05-05 DIAGNOSIS — J449 Chronic obstructive pulmonary disease, unspecified: Secondary | ICD-10-CM | POA: Diagnosis not present

## 2024-05-05 DIAGNOSIS — M25612 Stiffness of left shoulder, not elsewhere classified: Secondary | ICD-10-CM | POA: Diagnosis not present

## 2024-05-05 DIAGNOSIS — M25611 Stiffness of right shoulder, not elsewhere classified: Secondary | ICD-10-CM | POA: Diagnosis not present

## 2024-05-06 DIAGNOSIS — M25611 Stiffness of right shoulder, not elsewhere classified: Secondary | ICD-10-CM | POA: Diagnosis not present

## 2024-05-06 DIAGNOSIS — J449 Chronic obstructive pulmonary disease, unspecified: Secondary | ICD-10-CM | POA: Diagnosis not present

## 2024-05-06 DIAGNOSIS — M25612 Stiffness of left shoulder, not elsewhere classified: Secondary | ICD-10-CM | POA: Diagnosis not present

## 2024-05-09 DIAGNOSIS — M25612 Stiffness of left shoulder, not elsewhere classified: Secondary | ICD-10-CM | POA: Diagnosis not present

## 2024-05-09 DIAGNOSIS — J449 Chronic obstructive pulmonary disease, unspecified: Secondary | ICD-10-CM | POA: Diagnosis not present

## 2024-05-09 DIAGNOSIS — M25611 Stiffness of right shoulder, not elsewhere classified: Secondary | ICD-10-CM | POA: Diagnosis not present

## 2024-05-10 DIAGNOSIS — M25612 Stiffness of left shoulder, not elsewhere classified: Secondary | ICD-10-CM | POA: Diagnosis not present

## 2024-05-10 DIAGNOSIS — M25611 Stiffness of right shoulder, not elsewhere classified: Secondary | ICD-10-CM | POA: Diagnosis not present

## 2024-05-10 DIAGNOSIS — J449 Chronic obstructive pulmonary disease, unspecified: Secondary | ICD-10-CM | POA: Diagnosis not present

## 2024-05-11 DIAGNOSIS — M25612 Stiffness of left shoulder, not elsewhere classified: Secondary | ICD-10-CM | POA: Diagnosis not present

## 2024-05-11 DIAGNOSIS — J449 Chronic obstructive pulmonary disease, unspecified: Secondary | ICD-10-CM | POA: Diagnosis not present

## 2024-05-11 DIAGNOSIS — M25611 Stiffness of right shoulder, not elsewhere classified: Secondary | ICD-10-CM | POA: Diagnosis not present

## 2024-05-12 DIAGNOSIS — M25612 Stiffness of left shoulder, not elsewhere classified: Secondary | ICD-10-CM | POA: Diagnosis not present

## 2024-05-12 DIAGNOSIS — J449 Chronic obstructive pulmonary disease, unspecified: Secondary | ICD-10-CM | POA: Diagnosis not present

## 2024-05-12 DIAGNOSIS — M25611 Stiffness of right shoulder, not elsewhere classified: Secondary | ICD-10-CM | POA: Diagnosis not present

## 2024-05-13 DIAGNOSIS — I10 Essential (primary) hypertension: Secondary | ICD-10-CM | POA: Diagnosis not present

## 2024-05-13 DIAGNOSIS — M25611 Stiffness of right shoulder, not elsewhere classified: Secondary | ICD-10-CM | POA: Diagnosis not present

## 2024-05-13 DIAGNOSIS — J449 Chronic obstructive pulmonary disease, unspecified: Secondary | ICD-10-CM | POA: Diagnosis not present

## 2024-05-13 DIAGNOSIS — I2510 Atherosclerotic heart disease of native coronary artery without angina pectoris: Secondary | ICD-10-CM | POA: Diagnosis not present

## 2024-05-13 DIAGNOSIS — M25612 Stiffness of left shoulder, not elsewhere classified: Secondary | ICD-10-CM | POA: Diagnosis not present

## 2024-05-13 DIAGNOSIS — E1140 Type 2 diabetes mellitus with diabetic neuropathy, unspecified: Secondary | ICD-10-CM | POA: Diagnosis not present

## 2024-05-13 DIAGNOSIS — E782 Mixed hyperlipidemia: Secondary | ICD-10-CM | POA: Diagnosis not present

## 2024-05-14 DIAGNOSIS — J449 Chronic obstructive pulmonary disease, unspecified: Secondary | ICD-10-CM | POA: Diagnosis not present

## 2024-05-14 DIAGNOSIS — M25612 Stiffness of left shoulder, not elsewhere classified: Secondary | ICD-10-CM | POA: Diagnosis not present

## 2024-05-14 DIAGNOSIS — M25611 Stiffness of right shoulder, not elsewhere classified: Secondary | ICD-10-CM | POA: Diagnosis not present

## 2024-05-17 DIAGNOSIS — M25612 Stiffness of left shoulder, not elsewhere classified: Secondary | ICD-10-CM | POA: Diagnosis not present

## 2024-05-17 DIAGNOSIS — M25611 Stiffness of right shoulder, not elsewhere classified: Secondary | ICD-10-CM | POA: Diagnosis not present

## 2024-05-17 DIAGNOSIS — J449 Chronic obstructive pulmonary disease, unspecified: Secondary | ICD-10-CM | POA: Diagnosis not present

## 2024-05-18 DIAGNOSIS — J449 Chronic obstructive pulmonary disease, unspecified: Secondary | ICD-10-CM | POA: Diagnosis not present

## 2024-05-18 DIAGNOSIS — M25612 Stiffness of left shoulder, not elsewhere classified: Secondary | ICD-10-CM | POA: Diagnosis not present

## 2024-05-18 DIAGNOSIS — M25611 Stiffness of right shoulder, not elsewhere classified: Secondary | ICD-10-CM | POA: Diagnosis not present

## 2024-05-19 DIAGNOSIS — M25611 Stiffness of right shoulder, not elsewhere classified: Secondary | ICD-10-CM | POA: Diagnosis not present

## 2024-05-19 DIAGNOSIS — M25612 Stiffness of left shoulder, not elsewhere classified: Secondary | ICD-10-CM | POA: Diagnosis not present

## 2024-05-19 DIAGNOSIS — J449 Chronic obstructive pulmonary disease, unspecified: Secondary | ICD-10-CM | POA: Diagnosis not present

## 2024-05-20 DIAGNOSIS — M25611 Stiffness of right shoulder, not elsewhere classified: Secondary | ICD-10-CM | POA: Diagnosis not present

## 2024-05-20 DIAGNOSIS — M25612 Stiffness of left shoulder, not elsewhere classified: Secondary | ICD-10-CM | POA: Diagnosis not present

## 2024-05-20 DIAGNOSIS — J449 Chronic obstructive pulmonary disease, unspecified: Secondary | ICD-10-CM | POA: Diagnosis not present

## 2024-05-22 DIAGNOSIS — M25612 Stiffness of left shoulder, not elsewhere classified: Secondary | ICD-10-CM | POA: Diagnosis not present

## 2024-05-22 DIAGNOSIS — M25611 Stiffness of right shoulder, not elsewhere classified: Secondary | ICD-10-CM | POA: Diagnosis not present

## 2024-05-22 DIAGNOSIS — J449 Chronic obstructive pulmonary disease, unspecified: Secondary | ICD-10-CM | POA: Diagnosis not present

## 2024-05-23 DIAGNOSIS — J449 Chronic obstructive pulmonary disease, unspecified: Secondary | ICD-10-CM | POA: Diagnosis not present

## 2024-05-23 DIAGNOSIS — M25611 Stiffness of right shoulder, not elsewhere classified: Secondary | ICD-10-CM | POA: Diagnosis not present

## 2024-05-23 DIAGNOSIS — M25612 Stiffness of left shoulder, not elsewhere classified: Secondary | ICD-10-CM | POA: Diagnosis not present

## 2024-05-24 DIAGNOSIS — M25611 Stiffness of right shoulder, not elsewhere classified: Secondary | ICD-10-CM | POA: Diagnosis not present

## 2024-05-24 DIAGNOSIS — M25612 Stiffness of left shoulder, not elsewhere classified: Secondary | ICD-10-CM | POA: Diagnosis not present

## 2024-05-24 DIAGNOSIS — J449 Chronic obstructive pulmonary disease, unspecified: Secondary | ICD-10-CM | POA: Diagnosis not present

## 2024-05-26 DIAGNOSIS — M25612 Stiffness of left shoulder, not elsewhere classified: Secondary | ICD-10-CM | POA: Diagnosis not present

## 2024-05-26 DIAGNOSIS — M25611 Stiffness of right shoulder, not elsewhere classified: Secondary | ICD-10-CM | POA: Diagnosis not present

## 2024-05-26 DIAGNOSIS — J449 Chronic obstructive pulmonary disease, unspecified: Secondary | ICD-10-CM | POA: Diagnosis not present

## 2024-05-27 DIAGNOSIS — E1151 Type 2 diabetes mellitus with diabetic peripheral angiopathy without gangrene: Secondary | ICD-10-CM | POA: Diagnosis not present

## 2024-05-27 DIAGNOSIS — M25611 Stiffness of right shoulder, not elsewhere classified: Secondary | ICD-10-CM | POA: Diagnosis not present

## 2024-05-27 DIAGNOSIS — L603 Nail dystrophy: Secondary | ICD-10-CM | POA: Diagnosis not present

## 2024-05-27 DIAGNOSIS — J449 Chronic obstructive pulmonary disease, unspecified: Secondary | ICD-10-CM | POA: Diagnosis not present

## 2024-05-27 DIAGNOSIS — M25612 Stiffness of left shoulder, not elsewhere classified: Secondary | ICD-10-CM | POA: Diagnosis not present

## 2024-05-27 DIAGNOSIS — L602 Onychogryphosis: Secondary | ICD-10-CM | POA: Diagnosis not present

## 2024-05-27 DIAGNOSIS — Z794 Long term (current) use of insulin: Secondary | ICD-10-CM | POA: Diagnosis not present

## 2024-05-28 DIAGNOSIS — M25612 Stiffness of left shoulder, not elsewhere classified: Secondary | ICD-10-CM | POA: Diagnosis not present

## 2024-05-28 DIAGNOSIS — J449 Chronic obstructive pulmonary disease, unspecified: Secondary | ICD-10-CM | POA: Diagnosis not present

## 2024-05-28 DIAGNOSIS — M25611 Stiffness of right shoulder, not elsewhere classified: Secondary | ICD-10-CM | POA: Diagnosis not present

## 2024-05-31 DIAGNOSIS — M25612 Stiffness of left shoulder, not elsewhere classified: Secondary | ICD-10-CM | POA: Diagnosis not present

## 2024-05-31 DIAGNOSIS — M25611 Stiffness of right shoulder, not elsewhere classified: Secondary | ICD-10-CM | POA: Diagnosis not present

## 2024-05-31 DIAGNOSIS — J449 Chronic obstructive pulmonary disease, unspecified: Secondary | ICD-10-CM | POA: Diagnosis not present

## 2024-06-01 DIAGNOSIS — J449 Chronic obstructive pulmonary disease, unspecified: Secondary | ICD-10-CM | POA: Diagnosis not present

## 2024-06-01 DIAGNOSIS — M25612 Stiffness of left shoulder, not elsewhere classified: Secondary | ICD-10-CM | POA: Diagnosis not present

## 2024-06-01 DIAGNOSIS — M25611 Stiffness of right shoulder, not elsewhere classified: Secondary | ICD-10-CM | POA: Diagnosis not present

## 2024-06-02 DIAGNOSIS — J449 Chronic obstructive pulmonary disease, unspecified: Secondary | ICD-10-CM | POA: Diagnosis not present

## 2024-06-02 DIAGNOSIS — M25612 Stiffness of left shoulder, not elsewhere classified: Secondary | ICD-10-CM | POA: Diagnosis not present

## 2024-06-02 DIAGNOSIS — M25611 Stiffness of right shoulder, not elsewhere classified: Secondary | ICD-10-CM | POA: Diagnosis not present

## 2024-06-03 DIAGNOSIS — M25612 Stiffness of left shoulder, not elsewhere classified: Secondary | ICD-10-CM | POA: Diagnosis not present

## 2024-06-03 DIAGNOSIS — M25611 Stiffness of right shoulder, not elsewhere classified: Secondary | ICD-10-CM | POA: Diagnosis not present

## 2024-06-03 DIAGNOSIS — J449 Chronic obstructive pulmonary disease, unspecified: Secondary | ICD-10-CM | POA: Diagnosis not present

## 2024-06-04 DIAGNOSIS — J449 Chronic obstructive pulmonary disease, unspecified: Secondary | ICD-10-CM | POA: Diagnosis not present

## 2024-06-04 DIAGNOSIS — M25611 Stiffness of right shoulder, not elsewhere classified: Secondary | ICD-10-CM | POA: Diagnosis not present

## 2024-06-04 DIAGNOSIS — M25612 Stiffness of left shoulder, not elsewhere classified: Secondary | ICD-10-CM | POA: Diagnosis not present

## 2024-06-04 DIAGNOSIS — G894 Chronic pain syndrome: Secondary | ICD-10-CM | POA: Diagnosis not present

## 2024-06-08 DIAGNOSIS — I10 Essential (primary) hypertension: Secondary | ICD-10-CM | POA: Diagnosis not present

## 2024-06-08 DIAGNOSIS — E1140 Type 2 diabetes mellitus with diabetic neuropathy, unspecified: Secondary | ICD-10-CM | POA: Diagnosis not present

## 2024-06-08 DIAGNOSIS — E782 Mixed hyperlipidemia: Secondary | ICD-10-CM | POA: Diagnosis not present

## 2024-06-08 DIAGNOSIS — I2510 Atherosclerotic heart disease of native coronary artery without angina pectoris: Secondary | ICD-10-CM | POA: Diagnosis not present

## 2024-06-15 DIAGNOSIS — G894 Chronic pain syndrome: Secondary | ICD-10-CM | POA: Diagnosis not present

## 2024-06-21 DIAGNOSIS — N189 Chronic kidney disease, unspecified: Secondary | ICD-10-CM | POA: Diagnosis not present

## 2024-06-21 DIAGNOSIS — E1140 Type 2 diabetes mellitus with diabetic neuropathy, unspecified: Secondary | ICD-10-CM | POA: Diagnosis not present

## 2024-06-21 DIAGNOSIS — J449 Chronic obstructive pulmonary disease, unspecified: Secondary | ICD-10-CM | POA: Diagnosis not present

## 2024-06-21 DIAGNOSIS — I2510 Atherosclerotic heart disease of native coronary artery without angina pectoris: Secondary | ICD-10-CM | POA: Diagnosis not present

## 2024-07-02 DIAGNOSIS — E1121 Type 2 diabetes mellitus with diabetic nephropathy: Secondary | ICD-10-CM | POA: Diagnosis not present

## 2024-07-06 DIAGNOSIS — J449 Chronic obstructive pulmonary disease, unspecified: Secondary | ICD-10-CM | POA: Diagnosis not present

## 2024-07-06 DIAGNOSIS — E1140 Type 2 diabetes mellitus with diabetic neuropathy, unspecified: Secondary | ICD-10-CM | POA: Diagnosis not present

## 2024-07-06 DIAGNOSIS — E782 Mixed hyperlipidemia: Secondary | ICD-10-CM | POA: Diagnosis not present

## 2024-07-06 DIAGNOSIS — I509 Heart failure, unspecified: Secondary | ICD-10-CM | POA: Diagnosis not present

## 2024-07-22 DIAGNOSIS — E782 Mixed hyperlipidemia: Secondary | ICD-10-CM | POA: Diagnosis not present

## 2024-07-22 DIAGNOSIS — E1140 Type 2 diabetes mellitus with diabetic neuropathy, unspecified: Secondary | ICD-10-CM | POA: Diagnosis not present

## 2024-07-22 DIAGNOSIS — I2510 Atherosclerotic heart disease of native coronary artery without angina pectoris: Secondary | ICD-10-CM | POA: Diagnosis not present

## 2024-07-22 DIAGNOSIS — J449 Chronic obstructive pulmonary disease, unspecified: Secondary | ICD-10-CM | POA: Diagnosis not present

## 2024-07-23 DIAGNOSIS — E1140 Type 2 diabetes mellitus with diabetic neuropathy, unspecified: Secondary | ICD-10-CM | POA: Diagnosis not present

## 2024-07-24 DIAGNOSIS — E1140 Type 2 diabetes mellitus with diabetic neuropathy, unspecified: Secondary | ICD-10-CM | POA: Diagnosis not present

## 2024-07-25 DIAGNOSIS — E1140 Type 2 diabetes mellitus with diabetic neuropathy, unspecified: Secondary | ICD-10-CM | POA: Diagnosis not present

## 2024-07-27 DIAGNOSIS — Z9181 History of falling: Secondary | ICD-10-CM | POA: Diagnosis not present

## 2024-07-27 DIAGNOSIS — Z993 Dependence on wheelchair: Secondary | ICD-10-CM | POA: Diagnosis not present

## 2024-07-27 DIAGNOSIS — E1140 Type 2 diabetes mellitus with diabetic neuropathy, unspecified: Secondary | ICD-10-CM | POA: Diagnosis not present

## 2024-07-28 DIAGNOSIS — Z9181 History of falling: Secondary | ICD-10-CM | POA: Diagnosis not present

## 2024-07-28 DIAGNOSIS — Z993 Dependence on wheelchair: Secondary | ICD-10-CM | POA: Diagnosis not present

## 2024-07-28 DIAGNOSIS — E1140 Type 2 diabetes mellitus with diabetic neuropathy, unspecified: Secondary | ICD-10-CM | POA: Diagnosis not present

## 2024-07-29 DIAGNOSIS — Z993 Dependence on wheelchair: Secondary | ICD-10-CM | POA: Diagnosis not present

## 2024-07-29 DIAGNOSIS — E1140 Type 2 diabetes mellitus with diabetic neuropathy, unspecified: Secondary | ICD-10-CM | POA: Diagnosis not present

## 2024-07-29 DIAGNOSIS — Z9181 History of falling: Secondary | ICD-10-CM | POA: Diagnosis not present

## 2024-08-01 DIAGNOSIS — Z993 Dependence on wheelchair: Secondary | ICD-10-CM | POA: Diagnosis not present

## 2024-08-01 DIAGNOSIS — Z9181 History of falling: Secondary | ICD-10-CM | POA: Diagnosis not present

## 2024-08-01 DIAGNOSIS — E1140 Type 2 diabetes mellitus with diabetic neuropathy, unspecified: Secondary | ICD-10-CM | POA: Diagnosis not present

## 2024-08-03 DIAGNOSIS — E1140 Type 2 diabetes mellitus with diabetic neuropathy, unspecified: Secondary | ICD-10-CM | POA: Diagnosis not present

## 2024-08-03 DIAGNOSIS — Z9181 History of falling: Secondary | ICD-10-CM | POA: Diagnosis not present

## 2024-08-03 DIAGNOSIS — Z993 Dependence on wheelchair: Secondary | ICD-10-CM | POA: Diagnosis not present

## 2024-08-04 DIAGNOSIS — Z993 Dependence on wheelchair: Secondary | ICD-10-CM | POA: Diagnosis not present

## 2024-08-04 DIAGNOSIS — E1140 Type 2 diabetes mellitus with diabetic neuropathy, unspecified: Secondary | ICD-10-CM | POA: Diagnosis not present

## 2024-08-04 DIAGNOSIS — Z9181 History of falling: Secondary | ICD-10-CM | POA: Diagnosis not present

## 2024-08-05 DIAGNOSIS — Z993 Dependence on wheelchair: Secondary | ICD-10-CM | POA: Diagnosis not present

## 2024-08-05 DIAGNOSIS — Z9181 History of falling: Secondary | ICD-10-CM | POA: Diagnosis not present

## 2024-08-05 DIAGNOSIS — E1140 Type 2 diabetes mellitus with diabetic neuropathy, unspecified: Secondary | ICD-10-CM | POA: Diagnosis not present

## 2024-08-10 DIAGNOSIS — E1140 Type 2 diabetes mellitus with diabetic neuropathy, unspecified: Secondary | ICD-10-CM | POA: Diagnosis not present

## 2024-08-10 DIAGNOSIS — Z9181 History of falling: Secondary | ICD-10-CM | POA: Diagnosis not present

## 2024-08-10 DIAGNOSIS — Z993 Dependence on wheelchair: Secondary | ICD-10-CM | POA: Diagnosis not present

## 2024-08-11 DIAGNOSIS — Z993 Dependence on wheelchair: Secondary | ICD-10-CM | POA: Diagnosis not present

## 2024-08-11 DIAGNOSIS — E1140 Type 2 diabetes mellitus with diabetic neuropathy, unspecified: Secondary | ICD-10-CM | POA: Diagnosis not present

## 2024-08-11 DIAGNOSIS — Z9181 History of falling: Secondary | ICD-10-CM | POA: Diagnosis not present

## 2024-08-16 DIAGNOSIS — Z9181 History of falling: Secondary | ICD-10-CM | POA: Diagnosis not present

## 2024-08-16 DIAGNOSIS — E1140 Type 2 diabetes mellitus with diabetic neuropathy, unspecified: Secondary | ICD-10-CM | POA: Diagnosis not present

## 2024-08-16 DIAGNOSIS — Z993 Dependence on wheelchair: Secondary | ICD-10-CM | POA: Diagnosis not present

## 2024-08-17 DIAGNOSIS — J449 Chronic obstructive pulmonary disease, unspecified: Secondary | ICD-10-CM | POA: Diagnosis not present

## 2024-08-17 DIAGNOSIS — E782 Mixed hyperlipidemia: Secondary | ICD-10-CM | POA: Diagnosis not present

## 2024-08-17 DIAGNOSIS — I2510 Atherosclerotic heart disease of native coronary artery without angina pectoris: Secondary | ICD-10-CM | POA: Diagnosis not present

## 2024-08-17 DIAGNOSIS — Z993 Dependence on wheelchair: Secondary | ICD-10-CM | POA: Diagnosis not present

## 2024-08-17 DIAGNOSIS — E1140 Type 2 diabetes mellitus with diabetic neuropathy, unspecified: Secondary | ICD-10-CM | POA: Diagnosis not present

## 2024-08-17 DIAGNOSIS — Z9181 History of falling: Secondary | ICD-10-CM | POA: Diagnosis not present

## 2024-08-18 DIAGNOSIS — Z9181 History of falling: Secondary | ICD-10-CM | POA: Diagnosis not present

## 2024-08-18 DIAGNOSIS — E1140 Type 2 diabetes mellitus with diabetic neuropathy, unspecified: Secondary | ICD-10-CM | POA: Diagnosis not present

## 2024-08-18 DIAGNOSIS — Z993 Dependence on wheelchair: Secondary | ICD-10-CM | POA: Diagnosis not present

## 2024-08-20 DIAGNOSIS — Z993 Dependence on wheelchair: Secondary | ICD-10-CM | POA: Diagnosis not present

## 2024-08-20 DIAGNOSIS — E1140 Type 2 diabetes mellitus with diabetic neuropathy, unspecified: Secondary | ICD-10-CM | POA: Diagnosis not present

## 2024-08-20 DIAGNOSIS — Z9181 History of falling: Secondary | ICD-10-CM | POA: Diagnosis not present
